# Patient Record
Sex: Male | Born: 1940
Health system: Southern US, Community
[De-identification: ages and names within clinical notes are randomized; demographics above are authoritative.]

## PROBLEM LIST (undated history)

## (undated) DIAGNOSIS — K8050 Calculus of bile duct without cholangitis or cholecystitis without obstruction: Secondary | ICD-10-CM

## (undated) DIAGNOSIS — G20 Parkinson's disease: Secondary | ICD-10-CM

## (undated) DIAGNOSIS — K811 Chronic cholecystitis: Secondary | ICD-10-CM

## (undated) DIAGNOSIS — E119 Type 2 diabetes mellitus without complications: Secondary | ICD-10-CM

## (undated) DIAGNOSIS — I639 Cerebral infarction, unspecified: Secondary | ICD-10-CM

## (undated) DIAGNOSIS — I10 Essential (primary) hypertension: Secondary | ICD-10-CM

## (undated) DIAGNOSIS — G20A1 Parkinson's disease without dyskinesia, without mention of fluctuations: Secondary | ICD-10-CM

## (undated) HISTORY — PX: ELBOW SURGERY: SHX618

## (undated) HISTORY — PX: NO PAST SURGERIES: SHX2092

---

## 2006-04-21 ENCOUNTER — Emergency Department: Payer: Self-pay | Admitting: Emergency Medicine

## 2009-09-02 ENCOUNTER — Ambulatory Visit: Payer: Self-pay | Admitting: Ophthalmology

## 2009-12-31 ENCOUNTER — Ambulatory Visit: Payer: Self-pay | Admitting: Ophthalmology

## 2015-02-06 ENCOUNTER — Ambulatory Visit: Payer: Self-pay | Admitting: Family Medicine

## 2016-05-05 ENCOUNTER — Encounter: Payer: Self-pay | Admitting: Internal Medicine

## 2016-05-05 ENCOUNTER — Emergency Department: Payer: Medicare Other

## 2016-05-05 ENCOUNTER — Inpatient Hospital Stay
Admission: EM | Admit: 2016-05-05 | Discharge: 2016-05-10 | DRG: 065 | Disposition: A | Discharge status: Home / Self Care | Payer: Medicare Other | Attending: Internal Medicine | Admitting: Internal Medicine

## 2016-05-05 DIAGNOSIS — Y92230 Patient room in hospital as the place of occurrence of the external cause: Secondary | ICD-10-CM | POA: Diagnosis not present

## 2016-05-05 DIAGNOSIS — I6522 Occlusion and stenosis of left carotid artery: Secondary | ICD-10-CM | POA: Diagnosis present

## 2016-05-05 DIAGNOSIS — R29810 Facial weakness: Secondary | ICD-10-CM | POA: Diagnosis present

## 2016-05-05 DIAGNOSIS — I10 Essential (primary) hypertension: Secondary | ICD-10-CM | POA: Diagnosis present

## 2016-05-05 DIAGNOSIS — T426X5A Adverse effect of other antiepileptic and sedative-hypnotic drugs, initial encounter: Secondary | ICD-10-CM | POA: Diagnosis not present

## 2016-05-05 DIAGNOSIS — G459 Transient cerebral ischemic attack, unspecified: Secondary | ICD-10-CM | POA: Diagnosis not present

## 2016-05-05 DIAGNOSIS — R569 Unspecified convulsions: Secondary | ICD-10-CM | POA: Diagnosis not present

## 2016-05-05 DIAGNOSIS — E876 Hypokalemia: Secondary | ICD-10-CM | POA: Diagnosis not present

## 2016-05-05 DIAGNOSIS — R441 Visual hallucinations: Secondary | ICD-10-CM | POA: Diagnosis not present

## 2016-05-05 DIAGNOSIS — G40909 Epilepsy, unspecified, not intractable, without status epilepticus: Secondary | ICD-10-CM

## 2016-05-05 DIAGNOSIS — I639 Cerebral infarction, unspecified: Principal | ICD-10-CM | POA: Diagnosis present

## 2016-05-05 DIAGNOSIS — N179 Acute kidney failure, unspecified: Secondary | ICD-10-CM | POA: Diagnosis present

## 2016-05-05 DIAGNOSIS — R4702 Dysphasia: Secondary | ICD-10-CM | POA: Diagnosis present

## 2016-05-05 DIAGNOSIS — M6281 Muscle weakness (generalized): Secondary | ICD-10-CM

## 2016-05-05 DIAGNOSIS — E1165 Type 2 diabetes mellitus with hyperglycemia: Secondary | ICD-10-CM | POA: Diagnosis present

## 2016-05-05 DIAGNOSIS — R4182 Altered mental status, unspecified: Secondary | ICD-10-CM

## 2016-05-05 DIAGNOSIS — I63132 Cerebral infarction due to embolism of left carotid artery: Secondary | ICD-10-CM | POA: Diagnosis not present

## 2016-05-05 DIAGNOSIS — R4701 Aphasia: Secondary | ICD-10-CM | POA: Diagnosis present

## 2016-05-05 DIAGNOSIS — Z808 Family history of malignant neoplasm of other organs or systems: Secondary | ICD-10-CM | POA: Diagnosis not present

## 2016-05-05 HISTORY — DX: Essential (primary) hypertension: I10

## 2016-05-05 HISTORY — DX: Type 2 diabetes mellitus without complications: E11.9

## 2016-05-05 LAB — COMPREHENSIVE METABOLIC PANEL
ALK PHOS: 104 U/L (ref 38–126)
ALT: 22 U/L (ref 17–63)
AST: 28 U/L (ref 15–41)
Albumin: 3.7 g/dL (ref 3.5–5.0)
Anion gap: 7 (ref 5–15)
BUN: 19 mg/dL (ref 6–20)
CHLORIDE: 106 mmol/L (ref 101–111)
CO2: 25 mmol/L (ref 22–32)
CREATININE: 1.53 mg/dL — AB (ref 0.61–1.24)
Calcium: 8.5 mg/dL — ABNORMAL LOW (ref 8.9–10.3)
GFR calc Af Amer: 49 mL/min — ABNORMAL LOW (ref >60)
GFR, EST NON AFRICAN AMERICAN: 42 mL/min — AB (ref >60)
Glucose, Bld: 341 mg/dL — ABNORMAL HIGH (ref 65–99)
Potassium: 4 mmol/L (ref 3.5–5.1)
Sodium: 138 mmol/L (ref 135–145)
TOTAL PROTEIN: 7.2 g/dL (ref 6.5–8.1)

## 2016-05-05 LAB — CBC WITH DIFFERENTIAL/PLATELET
BASOS PCT: 1 %
Basophils Absolute: 0.1 10*3/uL (ref 0–0.1)
EOS ABS: 0.3 10*3/uL (ref 0–0.7)
EOS PCT: 4 %
HCT: 38.4 % — ABNORMAL LOW (ref 40.0–52.0)
Hemoglobin: 12.5 g/dL — ABNORMAL LOW (ref 13.0–18.0)
LYMPHS ABS: 2.3 10*3/uL (ref 1.0–3.6)
Lymphocytes Relative: 36 %
MCH: 26 pg (ref 26.0–34.0)
MCHC: 32.6 g/dL (ref 32.0–36.0)
MCV: 79.9 fL — ABNORMAL LOW (ref 80.0–100.0)
MONO ABS: 0.4 10*3/uL (ref 0.2–1.0)
MONOS PCT: 7 %
NEUTROS PCT: 52 %
Neutro Abs: 3.5 10*3/uL (ref 1.4–6.5)
Platelets: 157 10*3/uL (ref 150–440)
RBC: 4.81 MIL/uL (ref 4.40–5.90)
RDW: 15 % — ABNORMAL HIGH (ref 11.5–14.5)
WBC: 6.6 10*3/uL (ref 3.8–10.6)

## 2016-05-05 LAB — URINALYSIS, COMPLETE (UACMP) WITH MICROSCOPIC
BACTERIA UA: NONE SEEN
Bilirubin Urine: NEGATIVE
Glucose, UA: >=500 mg/dL — AB
Hgb urine dipstick: NEGATIVE
KETONES UR: NEGATIVE mg/dL
LEUKOCYTES UA: NEGATIVE
Nitrite: NEGATIVE
PH: 6 (ref 5.0–8.0)
PROTEIN: NEGATIVE mg/dL
RBC / HPF: NONE SEEN RBC/hpf (ref 0–5)
SQUAMOUS EPITHELIAL / LPF: NONE SEEN
Specific Gravity, Urine: 1.002 — ABNORMAL LOW (ref 1.005–1.030)

## 2016-05-05 LAB — URINE DRUG SCREEN, QUALITATIVE (ARMC ONLY)
Amphetamines, Ur Screen: NOT DETECTED
BARBITURATES, UR SCREEN: NOT DETECTED
Benzodiazepine, Ur Scrn: NOT DETECTED
COCAINE METABOLITE, UR ~~LOC~~: NOT DETECTED
Cannabinoid 50 Ng, Ur ~~LOC~~: NOT DETECTED
MDMA (Ecstasy)Ur Screen: NOT DETECTED
METHADONE SCREEN, URINE: NOT DETECTED
OPIATE, UR SCREEN: NOT DETECTED
Phencyclidine (PCP) Ur S: NOT DETECTED
Tricyclic, Ur Screen: NOT DETECTED

## 2016-05-05 LAB — PROTIME-INR
INR: 0.97
Prothrombin Time: 12.9 seconds (ref 11.4–15.2)

## 2016-05-05 LAB — GLUCOSE, CAPILLARY
GLUCOSE-CAPILLARY: 328 mg/dL — AB (ref 65–99)
GLUCOSE-CAPILLARY: 245 mg/dL — AB (ref 65–99)

## 2016-05-05 LAB — TROPONIN I

## 2016-05-05 MED ORDER — INSULIN ASPART 100 UNIT/ML ~~LOC~~ SOLN
0.0000 [IU] | Freq: Three times a day (TID) | SUBCUTANEOUS | Status: DC
  Administered 2016-05-06: 18:00:00 5 [IU] via SUBCUTANEOUS
  Administered 2016-05-06: 3 [IU] via SUBCUTANEOUS
  Administered 2016-05-07: 5 [IU] via SUBCUTANEOUS
  Administered 2016-05-07: 2 [IU] via SUBCUTANEOUS
  Administered 2016-05-07 – 2016-05-08 (×2): 3 [IU] via SUBCUTANEOUS
  Administered 2016-05-08 – 2016-05-09 (×3): 5 [IU] via SUBCUTANEOUS
  Administered 2016-05-09 (×2): 2 [IU] via SUBCUTANEOUS
  Filled 2016-05-05 (×2): qty 3
  Filled 2016-05-05 (×2): qty 2
  Filled 2016-05-05 (×2): qty 5
  Filled 2016-05-05: qty 3
  Filled 2016-05-05 (×2): qty 5
  Filled 2016-05-05: qty 2
  Filled 2016-05-05: qty 5

## 2016-05-05 MED ORDER — ATORVASTATIN CALCIUM 20 MG PO TABS
40.0000 mg | ORAL_TABLET | Freq: Every day | ORAL | Status: DC
  Administered 2016-05-06 – 2016-05-09 (×4): 40 mg via ORAL
  Filled 2016-05-05 (×4): qty 2

## 2016-05-05 MED ORDER — ACETAMINOPHEN 325 MG PO TABS: 650.0000 mg | ORAL_TABLET | Freq: Four times a day (QID) | ORAL | Status: DC | PRN

## 2016-05-05 MED ORDER — SODIUM CHLORIDE 0.9 % IV SOLN
INTRAVENOUS | Status: DC
  Administered 2016-05-05 – 2016-05-07 (×3): via INTRAVENOUS

## 2016-05-05 MED ORDER — INSULIN ASPART 100 UNIT/ML ~~LOC~~ SOLN
0.0000 [IU] | Freq: Every day | SUBCUTANEOUS | Status: DC
  Administered 2016-05-05 – 2016-05-06 (×2): 2 [IU] via SUBCUTANEOUS
  Filled 2016-05-05 (×2): qty 2

## 2016-05-05 MED ORDER — ASPIRIN 81 MG PO CHEW
324.0000 mg | CHEWABLE_TABLET | Freq: Once | ORAL | Status: AC
  Administered 2016-05-05: 324 mg via ORAL
  Filled 2016-05-05: qty 4

## 2016-05-05 MED ORDER — ENOXAPARIN SODIUM 40 MG/0.4ML ~~LOC~~ SOLN
40.0000 mg | SUBCUTANEOUS | Status: DC
  Administered 2016-05-05 – 2016-05-09 (×5): 40 mg via SUBCUTANEOUS
  Filled 2016-05-05 (×5): qty 0.4

## 2016-05-05 MED ORDER — ASPIRIN EC 81 MG PO TBEC
81.0000 mg | DELAYED_RELEASE_TABLET | Freq: Every day | ORAL | Status: DC
Start: 2016-05-06 — End: 2016-05-10
  Administered 2016-05-06 – 2016-05-10 (×5): 81 mg via ORAL
  Filled 2016-05-05 (×5): qty 1

## 2016-05-05 MED ORDER — AMLODIPINE BESYLATE 5 MG PO TABS
ORAL_TABLET | ORAL | Status: AC
  Administered 2016-05-05: 5 mg via ORAL
  Filled 2016-05-05: qty 1

## 2016-05-05 MED ORDER — AMLODIPINE BESYLATE 5 MG PO TABS
5.0000 mg | ORAL_TABLET | Freq: Every day | ORAL | Status: DC
  Administered 2016-05-05 – 2016-05-09 (×4): 5 mg via ORAL
  Filled 2016-05-05 (×4): qty 1

## 2016-05-05 MED ORDER — LISINOPRIL 10 MG PO TABS
10.0000 mg | ORAL_TABLET | Freq: Every day | ORAL | Status: DC
  Administered 2016-05-05 – 2016-05-06 (×2): 10 mg via ORAL
  Filled 2016-05-05 (×2): qty 1

## 2016-05-05 MED ORDER — ACETAMINOPHEN 650 MG RE SUPP: 650.0000 mg | Freq: Four times a day (QID) | RECTAL | Status: DC | PRN

## 2016-05-05 MED ORDER — INSULIN GLARGINE 100 UNIT/ML ~~LOC~~ SOLN
10.0000 [IU] | Freq: Every day | SUBCUTANEOUS | Status: DC
  Administered 2016-05-05 – 2016-05-07 (×3): 10 [IU] via SUBCUTANEOUS
  Filled 2016-05-05 (×4): qty 0.1

## 2016-05-05 NOTE — ED Provider Notes (Signed)
[facility] Emergency Department Provider Note    First MD Initiated Contact with Patient 05/05/16 1821     (approximate)  I have reviewed the triage vital signs and the nursing notes.   HISTORY  Chief Complaint Fatigue    HPI Gilbert  Sr. is a 76 y.o. male presents with difficulty speaking and confusion. Last seen normal was last night at 5:30 PM. Patient slept in. Wife came back and saw him around 2:30 and felt the first time that interacted the patient was very confused with slurred speech and not making any sense. States that there was one brief episode last week with similar symptoms for roughly 30 minutes that resolved. Denies any known history of stroke. No blood thinners. Denies any chest pain or pressure. No nausea or vomiting.   PMH: no hxx of cva Gu-Win: htn PSH: no recent surgeries There are no active problems to display for this patient.     Prior to Admission medications   Not on File    Allergies Patient has no allergy information on record.    Social History Social History  Substance Use Topics  . Smoking status: Not on file  . Smokeless tobacco: Not on file  . Alcohol use Not on file    Review of Systems Patient denies headaches, rhinorrhea, blurry vision, numbness, shortness of breath, chest pain, edema, cough, abdominal pain, nausea, vomiting, diarrhea, dysuria, fevers, rashes or hallucinations unless otherwise stated above in HPI. ____________________________________________   PHYSICAL EXAM:  VITAL SIGNS: Vitals:   05/05/16 2127 05/05/16 2232  BP: (!) 192/83 (!) 216/86  Pulse: 66 72  Resp: 17 20  Temp:  98 F (36.7 C)    Constitutional: Alert and oriented. Well appearing and in no acute distress. Eyes: Conjunctivae are normal. PERRL. EOMI. Head: Atraumatic. Nose: No congestion/rhinnorhea. Mouth/Throat: Mucous membranes are moist.  Oropharynx non-erythematous. Neck: No stridor. Painless ROM. No  cervical spine tenderness to palpation Hematological/Lymphatic/Immunilogical: No cervical lymphadenopathy. Cardiovascular: Normal rate, regular rhythm. Grossly normal heart sounds.  Good peripheral circulation. Respiratory: Normal respiratory effort.  No retractions. Lungs CTAB. Gastrointestinal: Soft and nontender. No distention. No abdominal bruits. No CVA tenderness. Genitourinary:  Musculoskeletal: No lower extremity tenderness nor edema.  No joint effusions. Neurologic:  slurred dysarthric speech partial receptive and expressive aphasia.  Able to follow simple commands. No gross focal motor deficits are appreciated. No gait instability.Skin:  Skin is warm, dry and intact. No rash noted. Psychiatric: Mood and affect are normal. Speech and behavior are normal.  ____________________________________________   LABS (all labs ordered are listed, but only abnormal results are displayed)  Results for orders placed or performed during the hospital encounter of 05/05/16 (from the past 24 hour(s))  Glucose, capillary     Status: Abnormal   Collection Time: 05/05/16  4:12 PM  Result Value Ref Range   Glucose-Capillary 328 (H) 65 - 99 mg/dL  CBC with Differential     Status: Abnormal   Collection Time: 05/05/16  4:19 PM  Result Value Ref Range   WBC 6.6 3.8 - 10.6 K/uL   RBC 4.81 4.40 - 5.90 MIL/uL   Hemoglobin 12.5 (L) 13.0 - 18.0 g/dL   HCT 38.4 (L) 40.0 - 52.0 %   MCV 79.9 (L) 80.0 - 100.0 fL   MCH 26.0 26.0 - 34.0 pg   MCHC 32.6 32.0 - 36.0 g/dL   RDW 15.0 (H) 11.5 - 14.5 %   Platelets 157 150 - 440 K/uL  Neutrophils Relative % 52 %   Neutro Abs 3.5 1.4 - 6.5 K/uL   Lymphocytes Relative 36 %   Lymphs Abs 2.3 1.0 - 3.6 K/uL   Monocytes Relative 7 %   Monocytes Absolute 0.4 0.2 - 1.0 K/uL   Eosinophils Relative 4 %   Eosinophils Absolute 0.3 0 - 0.7 K/uL   Basophils Relative 1 %   Basophils Absolute 0.1 0 - 0.1 K/uL  Comprehensive metabolic panel     Status: Abnormal    Collection Time: 05/05/16  4:19 PM  Result Value Ref Range   Sodium 138 135 - 145 mmol/L   Potassium 4.0 3.5 - 5.1 mmol/L   Chloride 106 101 - 111 mmol/L   CO2 25 22 - 32 mmol/L   Glucose, Bld 341 (H) 65 - 99 mg/dL   BUN 19 6 - 20 mg/dL   Creatinine, Ser 1.53 (H) 0.61 - 1.24 mg/dL   Calcium 8.5 (L) 8.9 - 10.3 mg/dL   Total Protein 7.2 6.5 - 8.1 g/dL   Albumin 3.7 3.5 - 5.0 g/dL   AST 28 15 - 41 U/L   ALT 22 17 - 63 U/L   Alkaline Phosphatase 104 38 - 126 U/L   Total Bilirubin <0.1 (L) 0.3 - 1.2 mg/dL   GFR calc non Af Amer 42 (L) >60 mL/min   GFR calc Af Amer 49 (L) >60 mL/min   Anion gap 7 5 - 15  Troponin I     Status: None   Collection Time: 05/05/16  4:19 PM  Result Value Ref Range   Troponin I <0.03 <0.03 ng/mL  Protime-INR     Status: None   Collection Time: 05/05/16  4:19 PM  Result Value Ref Range   Prothrombin Time 12.9 11.4 - 15.2 seconds   INR 0.97    ____________________________________________  EKG My review and personal interpretation at Time: 16:14   Indication: cva  Rate: sinus   Rhythm: 75 Axis: normal Other: bifasicular block,  Normal intervals ____________________________________________  RADIOLOGY  I personally reviewed all radiographic images ordered to evaluate for the above acute complaints and reviewed radiology reports and findings.  These findings were personally discussed with the patient.  Please see medical record for radiology report. ____________________________________________   PROCEDURES  Procedure(s) performed:  Procedures    Critical Care performed: no ____________________________________________   INITIAL IMPRESSION / ASSESSMENT AND PLAN / ED COURSE  Pertinent labs & imaging results that were available during my care of the patient were reviewed by me and considered in my medical decision making (see chart for details).  DDX: cva, tia, hypoglycemia, dehydration, electrolyte abnormality, dissection, sepsis   Gilbert BUNTROCK Sr. is a 76 y.o. who presents to the ED with Symptoms concerning for acute stroke. Unfortunately patient is outside of the window to receive TPA as his last seen normal time was 5:30 yesterday evening. However is taken emergently to [place]. There is evidence of small lacunar infarct in the basal ganglia. He's exhibiting more of an expressive aphasia at this time. Does not demonstrate any weakness. Does have a history of diabetes and hypertension. No known history of stroke.  Does not appear to be suffering from any other metabolic or infectious process that could result in these symptoms. Based on his presentation I do feel the patient needs admission to the hospital for further evaluation of his acute neuro deficit.  Have discussed with the patient and available family all diagnostics and treatments performed thus far and  all questions were answered to the best of my ability. The patient demonstrates understanding and agreement with plan.   Clinical Course      ____________________________________________   FINAL CLINICAL IMPRESSION(S) / ED DIAGNOSES  Final diagnoses:  Cerebrovascular accident (CVA), unspecified mechanism ([place])      NEW MEDICATIONS STARTED DURING THIS VISIT:  New Prescriptions   No medications on file     Note:  This document was prepared using Dragon voice recognition software and may include unintentional dictation errors.    Merlyn Lot, MD 05/06/16 [phone]

## 2016-05-05 NOTE — ED Notes (Signed)
Admitting MD at bedside.

## 2016-05-05 NOTE — ED Notes (Signed)
Patient transported to CT 

## 2016-05-05 NOTE — ED Triage Notes (Signed)
Pt arrived at triage alert but trouble talking.  Wife reports generalized weakness and garbled speech.  States she last saw him normal at 5;30 pm yesterday bc he was asleep when she got home and asleep when she left for work this am.  States at 14:30 when she got home today he was confused and having garbled speech and seems to be getting progressively worse since.  Both grips equal, right side facial droop noted.  Trouble following directions and finding words.

## 2016-05-05 NOTE — ED Notes (Signed)
Patient given meal tray.

## 2016-05-05 NOTE — H&P (Signed)
[facility] [place] NAME: Gilbert [name]    MR#:  [id number]  DATE OF BIRTH:  [birth year]  DATE OF ADMISSION:  05/05/2016  PRIMARY CARE PHYSICIAN: Golden Pop, MD   REQUESTING/REFERRING PHYSICIAN: Dr Merlyn Lot  CHIEF COMPLAINT:   Chief Complaint  Patient presents with  . Fatigue    HISTORY OF PRESENT ILLNESS:  Makaio Mach  is a 76 y.o. male with a known history of Diabetes and hypertension presents with fatigue and confusion. The patient is unable to give me much history. History obtained from granddaughter at the bedside. Other family members went home to get his medication list. The patient is able to follow some simple commands but is confused with the instructions. He is slurring his speech and unable to get out his words as he would like. He is able to move all of his extremities. His difficulty hearing may be complicating him interpreting what I'm saying. Patient could not identify who is medical doctor is.  PAST MEDICAL HISTORY:   Past Medical History:  Diagnosis Date  . Diabetes mellitus without complication ([place])   . Hypertension     PAST SURGICAL HISTORY:   Past Surgical History:  Procedure Laterality Date  . NO PAST SURGERIES      SOCIAL HISTORY:   Social History  Substance Use Topics  . Smoking status: Never Smoker  . Smokeless tobacco: Never Used  . Alcohol use No    FAMILY HISTORY:   Family History  Problem Relation Age of Onset  . Brain cancer Mother     DRUG ALLERGIES:  Allergies not on file  REVIEW OF SYSTEMS:  CONSTITUTIONAL: No fever, fatigueAs per chief complaint the patient denies.  EYES: No blurred or double vision.  EARS, NOSE, AND THROAT: No tinnitus or ear pain. No sore throat. Decreased hearing RESPIRATORY: No cough, shortness of breath, wheezing or hemoptysis.  CARDIOVASCULAR: No chest pain, orthopnea, edema.  GASTROINTESTINAL: No nausea, vomiting, diarrhea or  abdominal pain. No blood in bowel movements GENITOURINARY: No dysuria, hematuria.  ENDOCRINE: No polyuria, nocturia,  HEMATOLOGY: No anemia, easy bruising or bleeding SKIN: No rash or lesion. MUSCULOSKELETAL: No joint pain or arthritis.   NEUROLOGIC: No tingling, numbness, weakness.  PSYCHIATRY: No anxiety or depression.   MEDICATIONS AT HOME:   Prior to Admission medications   Not on File    Family going home to get his medication list. Medication reconciliation process undergoing  VITAL SIGNS:  Vital signs have not been entered yet in the computer  PHYSICAL EXAMINATION:  GENERAL:  76 y.o.-year-old patient lying in the bed with no acute distress.  EYES: Pupils equal, round, reactive to light and accommodation. No scleral icterus. Extraocular muscles intact.  HEENT: Head atraumatic, normocephalic. Oropharynx and nasopharynx clear.  NECK:  Supple, no jugular venous distention. No thyroid enlargement, no tenderness.  LUNGS: Normal breath sounds bilaterally, no wheezing, rales,rhonchi or crepitation. No use of accessory muscles of respiration.  CARDIOVASCULAR: S1, S2 normal. No murmurs, rubs, or gallops.  ABDOMEN: Soft, nontender, nondistended. Bowel sounds present. No organomegaly or mass.  EXTREMITIES: No pedal edema, cyanosis, or clubbing.  NEUROLOGIC: Cranial nerves II through XII are intact. Muscle strength 5/5 in all extremities. Sensation intact. Gait not checked. Patient not able to process finger nose testing. Babinski equivocal PSYCHIATRIC: The patient is alert and answers yes or no questions.  SKIN: No rash, lesion, or ulcer.   LABORATORY PANEL:   CBC  Recent Labs Lab 05/05/16 1619  WBC 6.6  HGB 12.5*  HCT 38.4*  PLT 157   ------------------------------------------------------------------------------------------------------------------  Chemistries   Recent Labs Lab 05/05/16 1619  NA 138  K 4.0  CL 106  CO2 25  GLUCOSE 341*  BUN 19  CREATININE 1.53*   CALCIUM 8.5*  AST 28  ALT 22  ALKPHOS 104  BILITOT <0.1*   ------------------------------------------------------------------------------------------------------------------  Cardiac Enzymes  Recent Labs Lab 05/05/16 1619  TROPONINI <0.03   ------------------------------------------------------------------------------------------------------------------  RADIOLOGY:  Ct Head Wo Contrast  Result Date: 05/05/2016 CLINICAL DATA:  Altered mental status, aphasia EXAM: CT HEAD WITHOUT CONTRAST TECHNIQUE: Contiguous axial images were obtained from the base of the skull through the vertex without intravenous contrast. COMPARISON:  None. FINDINGS: Brain: No intracranial hemorrhage, mass effect or midline shift. Mild cerebral atrophy. Periventricular and patchy subcortical white matter decreased attenuation probable due to chronic small vessel ischemic changes. There is probable small lacunar infarct in left basal ganglia. No definite acute cortical infarction. No mass lesion is noted on this unenhanced scan. Vascular: Mild atherosclerotic calcifications of carotid siphon. Skull: No skull fracture is noted. Sinuses/Orbits: No acute findings Other: None IMPRESSION: No acute intracranial abnormality. No definite acute cortical infarction. Mild cerebral atrophy. There is periventricular and patchy subcortical white matter decreased attenuation probable due to chronic small vessel ischemic changes. Small lacunar infarct is noted in left basal ganglia. Electronically Signed   By: Lahoma Crocker M.D.   On: 05/05/2016 16:39    EKG:   Bifasicular Block  IMPRESSION AND PLAN:   1. Acute stroke with confusion, difficulty processing information, difficulty with speech and getting his words out. MRI of the brain, echocardiogram and carotid ultrasound. Neurology evaluation, speech therapy evaluation physical therapy and occupation therapy evaluation. Start aspirin and statin. Check a lipid profile tomorrow morning  check a hemoglobin A1c. Patient passed swallow evaluation in the ER. 2. Essential hypertension I was asked to see patient before the first vital sign even entered in the computer. I will assess his initial blood pressure and decide whether or not to give any medications. I will allow permissive hypertension. 3. Type 2 diabetes mellitus uncontrolled. Check a hemoglobin A1c. Start sliding scale. 4. Acute kidney injury. No baseline creatinine to compare. Gentle IV fluids overnight recheck creatinine tomorrow morning.   All the records are reviewed and case discussed with ED provider. Management plans discussed with the patient, family and they are in agreement.  CODE STATUS: Full code  TOTAL TIME TAKING CARE OF THIS PATIENT: 50 minutes.    Loletha Grayer M.D on 05/05/2016 at 7:40 PM  Between 7am to 6pm - Pager - [phone]  After 6pm call admission pager [phone]  [facility]  [phone]  CC: Primary care physician; Golden Pop, MD

## 2016-05-06 ENCOUNTER — Inpatient Hospital Stay: Payer: Medicare Other

## 2016-05-06 ENCOUNTER — Inpatient Hospital Stay (HOSPITAL_COMMUNITY)
Admit: 2016-05-06 | Discharge: 2016-05-06 | Disposition: A | Discharge status: Home / Self Care | Payer: Medicare Other | Attending: Internal Medicine | Admitting: Internal Medicine

## 2016-05-06 DIAGNOSIS — R4701 Aphasia: Secondary | ICD-10-CM

## 2016-05-06 DIAGNOSIS — I63132 Cerebral infarction due to embolism of left carotid artery: Secondary | ICD-10-CM

## 2016-05-06 DIAGNOSIS — G459 Transient cerebral ischemic attack, unspecified: Secondary | ICD-10-CM

## 2016-05-06 LAB — ECHOCARDIOGRAM COMPLETE
HEIGHTINCHES: 72 in
WEIGHTICAEL: 3176 [oz_av]

## 2016-05-06 LAB — BASIC METABOLIC PANEL
ANION GAP: 5 (ref 5–15)
BUN: 17 mg/dL (ref 6–20)
CO2: 29 mmol/L (ref 22–32)
Calcium: 8.5 mg/dL — ABNORMAL LOW (ref 8.9–10.3)
Chloride: 109 mmol/L (ref 101–111)
Creatinine, Ser: 1.37 mg/dL — ABNORMAL HIGH (ref 0.61–1.24)
GFR calc Af Amer: 56 mL/min — ABNORMAL LOW (ref >60)
GFR, EST NON AFRICAN AMERICAN: 48 mL/min — AB (ref >60)
GLUCOSE: 193 mg/dL — AB (ref 65–99)
POTASSIUM: 3.7 mmol/L (ref 3.5–5.1)
Sodium: 143 mmol/L (ref 135–145)

## 2016-05-06 LAB — LIPID PANEL
CHOLESTEROL: 132 mg/dL (ref 0–200)
HDL: 29 mg/dL — ABNORMAL LOW (ref >40)
LDL CALC: 61 mg/dL (ref 0–99)
TRIGLYCERIDES: 212 mg/dL — AB (ref <150)
Total CHOL/HDL Ratio: 4.6 RATIO
VLDL: 42 mg/dL — AB (ref 0–40)

## 2016-05-06 LAB — CBC
HEMATOCRIT: 35.8 % — AB (ref 40.0–52.0)
HEMOGLOBIN: 11.9 g/dL — AB (ref 13.0–18.0)
MCH: 26.3 pg (ref 26.0–34.0)
MCHC: 33.3 g/dL (ref 32.0–36.0)
MCV: 78.9 fL — ABNORMAL LOW (ref 80.0–100.0)
Platelets: 145 10*3/uL — ABNORMAL LOW (ref 150–440)
RBC: 4.54 MIL/uL (ref 4.40–5.90)
RDW: 14.6 % — AB (ref 11.5–14.5)
WBC: 6.3 10*3/uL (ref 3.8–10.6)

## 2016-05-06 LAB — GLUCOSE, CAPILLARY
Glucose-Capillary: 229 mg/dL — ABNORMAL HIGH (ref 65–99)
Glucose-Capillary: 264 mg/dL — ABNORMAL HIGH (ref 65–99)
Glucose-Capillary: 201 mg/dL — ABNORMAL HIGH (ref 65–99)

## 2016-05-06 MED ORDER — CLOPIDOGREL BISULFATE 75 MG PO TABS
75.0000 mg | ORAL_TABLET | Freq: Every day | ORAL | Status: DC
  Administered 2016-05-06 – 2016-05-10 (×5): 75 mg via ORAL
  Filled 2016-05-06 (×5): qty 1

## 2016-05-06 MED ORDER — LABETALOL HCL 5 MG/ML IV SOLN
10.0000 mg | INTRAVENOUS | Status: DC | PRN
  Administered 2016-05-06 – 2016-05-08 (×3): 10 mg via INTRAVENOUS
  Filled 2016-05-06 (×5): qty 4

## 2016-05-06 MED ORDER — IOPAMIDOL (ISOVUE-370) INJECTION 76%
75.0000 mL | Freq: Once | INTRAVENOUS | Status: AC | PRN
  Administered 2016-05-06: 75 mL via INTRAVENOUS

## 2016-05-06 NOTE — Evaluation (Signed)
Physical Therapy Evaluation Patient Details Name: Gilbert AYALA Sr. MRN: [id number] DOB: August 13, 1940 Today's Date: 05/06/2016   History of Present Illness  76 y/o male here with significant change in mental status.  He's having severe word finding issues, probable vision issues.  Pt is generally healthy at baseline and able to be active and independent.   Clinical Impression  Difficult eval as pt not only seemed to have trouble fully comprehending most of the instructions and cues given to him but more significantly he was unable to communicate appropriately back to PT.  He appeared to grossly have functional strength and mobility and was able to ambulate 200 ft but all of these were inherently limited secondary to pt comprehension and full formal testing of strength, coordination, etc were all difficult or impossible.  Pt appeared to have at least blurry vision if not visual field cuts, again difficult to assess secondary to receptive and expressive limitations.  Spent a lot of time educating family (and pt) on rehab and activity options, exercises, ways to assist/help and answering general questions - family very involved and interested in assisting however they can.    Follow Up Recommendations Home health PT;Outpatient PT;Supervision/Assistance - 24 hour (per progress - difficult secondary to communication issues)    Equipment Recommendations       Recommendations for Other Services       Precautions / Restrictions Precautions Precautions: Fall Restrictions Weight Bearing Restrictions: No      Mobility  Bed Mobility Overal bed mobility: Independent             General bed mobility comments: Pt getting up impulsively and was able to do so w/o assist  Transfers Overall transfer level: Independent Equipment used: None             General transfer comment: Pt able to get up w/o heavy UE assist and did not need any direct assist from PT.  He was somewhat impulsive but  generally safe and able to maintain his balance.  Ambulation/Gait Ambulation/Gait assistance: Min guard Ambulation Distance (Feet): 200 Feet Assistive device: None       General Gait Details: Pt able to do straigh-line gross motor ambulation with good confidence and generally good safety.  He was able to follow instructions to stop and instructions to turn around but he was uanble to understand turning right or left and multiple other simple instructions  Stairs            Wheelchair Mobility    Modified Rankin (Stroke Patients Only)       Balance Overall balance assessment: Modified Independent (no LOBs, a few stagger steps more related to cognition)                                           Pertinent Vitals/Pain Pain Assessment:  (family reports he's not seemed to be in pain, no indication)    Home Living Family/patient expects to be discharged to:: Private residence Living Arrangements: Spouse/significant other Available Help at Discharge: Family   Home Access: Stairs to enter   Technical brewer of Steps: 4          Prior Function Level of Independence: Independent         Comments: Pt normally able to be very active, apparently last week he was in the crawl space thawing frozen pipes with a blow torch  Hand Dominance   Dominant Hand: Right    Extremity/Trunk Assessment   Upper Extremity Assessment Upper Extremity Assessment: Difficult to assess due to impaired cognition;RUE deficits/detail (inconsistent/slow processing of task requests, L appears [facility]) RUE Deficits / Details: pt able to raise R UE to ~130 deg overhead, strength is functional 5/5, though appeared weaker than L. Pt unable to do any coordianation tasks secondary to processing.    Lower Extremity Assessment Lower Extremity Assessment: Difficult to assess due to impaired cognition (generally WFL, poor awareness of formal testing)       Communication    Communication: Receptive difficulties;Expressive difficulties (signficant struggle to form any appropriate/full words)  Cognition Arousal/Alertness:  (awake, appears to be aware, but unable to fully process) Behavior During Therapy:  (pt seems frustrated with vision and word finding issues) Overall Cognitive Status: Difficult to assess                 General Comments: Pt able to follow some simple instructions, struggles signficantly with even minimally complex tasks.  He is inconsistent with answering simple yes/no questions but generally showed decent safety awareness with basic mobility.    General Comments      Exercises     Assessment/Plan    PT Assessment Patient needs continued PT services  PT Problem List Decreased strength;Decreased range of motion;Decreased activity tolerance;Decreased balance;Decreased coordination;Decreased cognition;Decreased safety awareness          PT Treatment Interventions Gait training;Functional mobility training;Stair training;Therapeutic activities;Therapeutic exercise;Balance training;Neuromuscular re-education;Cognitive remediation;Patient/family education    PT Goals (Current goals can be found in the Care Plan section)  Acute Rehab PT Goals Patient Stated Goal: unable to state PT Goal Formulation: With family Time For Goal Achievement: 05/20/16 Potential to Achieve Goals: Fair    Frequency 7X/week   Barriers to discharge        Co-evaluation               End of Session Equipment Utilized During Treatment: Gait belt Activity Tolerance: Patient tolerated treatment well (limited secondary to profound communication issues) Patient left: with chair alarm set;with call bell/phone within reach;with family/visitor present Nurse Communication: Mobility status         Time: 6606-3016 PT Time Calculation (min) (ACUTE ONLY): 1425 min   Charges:   PT Evaluation $PT Eval High Complexity: 1 Procedure PT  Treatments $Therapeutic Activity: 8-22 mins   PT G Codes:        Kreg Shropshire, DPT 05/06/2016, 1:36 PM

## 2016-05-06 NOTE — Evaluation (Addendum)
Clinical/Bedside Swallow Evaluation Patient Details  Name: Gilbert FIUMARA Sr. MRN: [id number] Date of Birth: 1940/11/10  Today's Date: 05/06/2016 Time: SLP Start Time (ACUTE ONLY): 1350 SLP Stop Time (ACUTE ONLY): 1445 SLP Time Calculation (min) (ACUTE ONLY): 55 min  Past Medical History:  Past Medical History:  Diagnosis Date  . Diabetes mellitus without complication ([place])   . Hypertension    Past Surgical History:  Past Surgical History:  Procedure Laterality Date  . NO PAST SURGERIES     HPI:  Pt is an 76 y.o. male with a history of DM and HTN who on yesterday was found by wife when she got home to be confused and unable to communicate appropriately. Patient was ambulatory and no focal weakness was noted. Family brought patient to ED for evaluation. Pt is generally healthy at baseline and able to be active and independent at home/community. During initial presentation, pt exhibited semantic/phonemic paraphasias and word finding issues - unable to comprehend pt's responses during confrontational questions; noted intermittent, automatic speech responses/verbalizations. Unsure if Receptive language deficits or if Expressive deficits are impacting follow through. Pt also exhibited Right vision deficits.    Assessment / Plan / Recommendation Clinical Impression  Pt appears to present w/ reduced risk for aspiration when following general aspiration precautions. Pt consumed po trials of thin liquids and soft solids w/ no overt s/s of aspiration noted. Pt fed self but exhibited some difficulty stabbing at the food w/ a utensil d/t the vision deficits(R). Pt also exhibited moderate-severe expressive language deficits (suspect receptive deficits which need to be r/o) which impacted communication interactions w/ pt during instruction and encouragement to eat/drink at meal. After few trials, pt declined further stating "don't want it right now". Pt appears at his baseline for adequate toleration of  oral intake following general aspiration precautions w/ no further need for ST services re: dysphagia. Recommend continue w/ current diet as ordered w/ assistance at meals d/t vision deficits; language deficits. Recommend ST services f/u w/ Expressive/Receptive lanaguage assessment tomorrow. Pt's language deficits severely impact his communication abilities w/ others during ADLs.    Aspiration Risk   (reduced)    Diet Recommendation  Regular diet w/ thin liquids; general aspiration precautions  Medication Administration: Whole meds with liquid (as tolerates)    Other  Recommendations Recommended Consults:  (none) Oral Care Recommendations: Oral care BID;Staff/trained caregiver to provide oral care   Follow up Recommendations None (re: dysphagia)      Frequency and Duration            Prognosis Prognosis for Safe Diet Advancement: Good Barriers to Reach Goals: Language deficits (present)      Swallow Study   General Date of Onset: 05/05/16 HPI: Pt is an 76 y.o. male with a history of DM and HTN who on yesterday was found by wife when she got home to be confused and unable to communicate appropriately. Patient was ambulatory and no focal weakness was noted. Family brought patient to ED for evaluation. Pt is generally healthy at baseline and able to be active and independent at home/community. During initial presentation, pt exhibited semantic/phonemic paraphasias and word finding issues - unable to comprehend pt's responses during confrontational questions; noted intermittent, automatic speech responses/verbalizations. Unsure if Receptive language deficits or if Expressive deficits are impacting follow through. Pt also exhibited Right vision deficits.  Type of Study: Bedside Swallow Evaluation Previous Swallow Assessment: none Diet Prior to this Study: Regular;Thin liquids Temperature Spikes Noted: No (wbc not elevated; 6.3)  Respiratory Status: Room air History of Recent Intubation:  No Behavior/Cognition: Alert;Cooperative;Pleasant mood;Distractible (language deficits) Oral Cavity Assessment: Within Functional Limits Oral Care Completed by SLP: Recent completion by staff Oral Cavity - Dentition: Adequate natural dentition;Missing dentition Vision: Impaired for self-feeding (Right ) Self-Feeding Abilities: Able to feed self;Needs assist;Needs set up (d/t vision deficits/impact) Patient Positioning: Upright in bed Baseline Vocal Quality: Normal Volitional Cough: Cognitively unable to elicit (language) Volitional Swallow: Unable to elicit (language)    Oral/Motor/Sensory Function Overall Oral Motor/Sensory Function: Within functional limits (appeared wfl w/ bolus management)   Ice Chips Ice chips: Not tested   Thin Liquid Thin Liquid: Within functional limits Presentation: Straw Other Comments: no deficits noted by family earlier during the meal    Nectar Thick Nectar Thick Liquid: Not tested   Honey Thick Honey Thick Liquid: Not tested   Puree Puree: Not tested Other Comments: no deficits noted earlier during the meal   Solid   GO   Solid: Within functional limits Presentation: Self Fed;Spoon (peaches) Other Comments: vision deficits impacted self-feeding         Orinda Kenner, MS, CCC-SLP Watson,Katherine 05/06/2016,3:12 PM

## 2016-05-06 NOTE — Progress Notes (Signed)
*  PRELIMINARY RESULTS* Echocardiogram 2D Echocardiogram has been performed.  Sherrie Sport 05/06/2016, 8:38 AM

## 2016-05-06 NOTE — Progress Notes (Signed)
OT Cancellation Note  Patient Details Name: Gilbert [name]. MRN: [id number] DOB: 02-12-1941   Cancelled Treatment:    Reason Eval/Treat Not Completed: Patient at procedure or test/ unavailable. Order received, chart reviewed. Pt out of room for testing. Will attempt at later date/time when pt is available.  Corky Sox, OTR/L 05/06/2016, 2:51 PM

## 2016-05-06 NOTE — Progress Notes (Addendum)
[facility] NAME: Gilbert [name]    MR#:  [id number]  DATE OF BIRTH:  January 08, 1941  SUBJECTIVE:  Patient came in with confusion and expressive aphasia. Continues to have expressive aphasia. Follows all commands though.  REVIEW OF SYSTEMS:   Review of Systems  Constitutional: Negative for chills, fever and weight loss.  HENT: Negative for ear discharge, ear pain and nosebleeds.   Eyes: Negative for blurred vision, pain and discharge.  Respiratory: Negative for sputum production, shortness of breath, wheezing and stridor.   Cardiovascular: Negative for chest pain, palpitations, orthopnea and PND.  Gastrointestinal: Negative for abdominal pain, diarrhea, nausea and vomiting.  Genitourinary: Negative for frequency and urgency.  Musculoskeletal: Negative for back pain and joint pain.  Neurological: Positive for speech change. Negative for sensory change, focal weakness and weakness.  Psychiatric/Behavioral: Negative for depression and hallucinations. The patient is not nervous/anxious.    Tolerating Diet: Tolerating PT:   DRUG ALLERGIES:  No Known Allergies  VITALS:  Blood pressure (!) 189/77, pulse 85, temperature 98.2 F (36.8 C), temperature source Oral, resp. rate 16, height 6' (1.829 m), weight 90 kg (198 lb 8 oz), SpO2 91 %.  PHYSICAL EXAMINATION:   Physical Exam  GENERAL:  76 y.o.-year-old patient lying in the bed with no acute distress.  EYES: Pupils equal, round, reactive to light and accommodation. No scleral icterus. Extraocular muscles intact.  HEENT: Head atraumatic, normocephalic. Oropharynx and nasopharynx clear.  NECK:  Supple, no jugular venous distention. No thyroid enlargement, no tenderness.  LUNGS: Normal breath sounds bilaterally, no wheezing, rales, rhonchi. No use of accessory muscles of respiration.  CARDIOVASCULAR: S1, S2 normal. No murmurs, rubs, or gallops.  ABDOMEN: Soft, nontender,  nondistended. Bowel sounds present. No organomegaly or mass.  EXTREMITIES: No cyanosis, clubbing or edema b/l.    NEUROLOGIC: Cranial nerves II through XII are intact. No focal Motor or sensory deficits b/l.  Expressive aphasia PSYCHIATRIC:  patient is alert and able to check orientation secondary to expressive aphasia. SKIN: No obvious rash, lesion, or ulcer.   LABORATORY PANEL:  CBC  Recent Labs Lab 05/06/16 0444  WBC 6.3  HGB 11.9*  HCT 35.8*  PLT 145*    Chemistries   Recent Labs Lab 05/05/16 1619 05/06/16 0444  NA 138 143  K 4.0 3.7  CL 106 109  CO2 25 29  GLUCOSE 341* 193*  BUN 19 17  CREATININE 1.53* 1.37*  CALCIUM 8.5* 8.5*  AST 28  --   ALT 22  --   ALKPHOS 104  --   BILITOT <0.1*  --    Cardiac Enzymes  Recent Labs Lab 05/05/16 1619  TROPONINI <0.03   RADIOLOGY:  Ct Angio Head W Or Wo Contrast  Result Date: 05/06/2016 CLINICAL DATA:  76 year old male with fatigue and confusion. Prior carotid stent/endarterectomy. Left carotid stenosis on Doppler ultrasound today. Initial encounter. EXAM: CT ANGIOGRAPHY HEAD AND NECK TECHNIQUE: Multidetector CT imaging of the head and neck was performed using the standard protocol during bolus administration of intravenous contrast. Multiplanar CT image reconstructions and MIPs were obtained to evaluate the vascular anatomy. Carotid stenosis measurements (when applicable) are obtained utilizing NASCET criteria, using the distal internal carotid diameter as the denominator. CONTRAST:  75 mL Isovue 370 COMPARISON:  Brain MRI 0955 hours today. Head CT without contrast 05/05/2016. Carotid Doppler ultrasound 0833 hours today. FINDINGS: CTA NECK Skeleton: Fairly poor residual dentition. Mild for age cervical spine degeneration. No acute osseous  abnormality identified. Stable paranasal sinuses and mastoids. Upper chest: Dependent pulmonary atelectasis. Suggestion of some superimposed centrilobular emphysema. No superior mediastinal  lymphadenopathy, with scattered coarsely calcified mediastinal lymph nodes. Other neck: Negative thyroid, larynx, pharynx, parapharyngeal spaces, retropharyngeal space, sublingual space, submandibular glands and parotid glands. No cervical lymphadenopathy. Aortic arch: 4 vessel arch configuration, the left vertebral artery arises directly from the arch. Moderate soft and calcified arch atherosclerosis. Right carotid system: No brachiocephalic artery stenosis despite soft and calcified plaque. Mildly tortuous proximal right CCA. Mostly medial mild soft and calcified plaque proximal to the bifurcation without stenosis. At the right carotid bifurcation there is mild soft and calcified plaque but the right ICA origin and bulb are widely patent. Negative cervical right ICA otherwise. Left carotid system: No left CCA origin stenosis. Anterior and medial soft and calcified plaque in the left CCA proximal to the bifurcation including rather bulky low-density plaque at the level of the larynx (series 4, image 73), but this is not hemodynamically significant. At the left carotid bifurcation there is bulky soft and calcified plaque affecting the left ICA origin and bulb and resulting in high-grade stenosis numerically estimated at 60 % with respect to the distal vessel, but may be mildly underestimated. Distal to the bulb the cervical left ICA is negative. Vertebral arteries:No proximal right subclavian artery stenosis despite calcified plaque. Calcified plaque at the right vertebral artery origin but no hemodynamically significant stenosis suspected (series 7, image 125). Tortuous right V2 segment but otherwise negative right vertebral artery to the skullbase. Left vertebral artery arises directly from the arch with adjacent soft and calcified plaque, but no definite origins stenosis. However, there is proximal V1 calcified plaque resulting in mild to moderate stenosis in the superior mediastinum (series 6, image 324). Mildly  tortuous left V1 segment. The left vertebral is mildly non dominant but otherwise normal to the skullbase. CTA HEAD Posterior circulation: No distal vertebral artery stenosis, the right is mildly dominant. Normal right PICA origin. Dominant appearing right AICA. Normal vertebrobasilar junction, AICA origins, bilateral SCA and bilateral PCA origins. No basilar stenosis. Posterior communicating arteries are diminutive or absent. Bilateral PCA branches are within normal limits. Anterior circulation: Both ICA siphons are patent. There is comparatively mild cavernous segment calcified atherosclerosis bilaterally without stenosis. Normal ophthalmic artery origins. Normal carotid termini, MCA and ACA origins. Mildly tortuous A1 segments. Anterior communicating artery and bilateral ACA branches are within normal limits, with a prominent median artery of the corpus callosum (normal variant). Left MCA M1 segment has an early bifurcation which is normal. The left M1 and left MCA branches are within normal limits. Right MCA M1 segment, bifurcation, and right MCA branches are within normal limits. Venous sinuses: Patent. Anatomic variants: Left vertebral artery arises directly from the arch and is non dominant. Prominent median artery of the corpus callosum. Delayed phase: No abnormal enhancement identified. Stable gray-white matter differentiation throughout the brain. Review of the MIP images confirms the above findings IMPRESSION: 1. Negative for emergent large vessel occlusion. 2. Suspect previous right carotid endarterectomy with residual right carotid atherosclerosis but no stenosis. 3. Bulky soft and calcified left cervical carotid atherosclerosis, including involvement of the left ICA origin and bulb. Subsequent stenosis numerically estimated at 60%. 4. No significant stenosis of the dominant right vertebral artery. Soft and calcified plaque in the left V1 segment with up to moderate stenosis. 5. Comparatively little  intracranial atherosclerosis with no intracranial stenosis identified. 6.  Stable CT appearance of the brain. Electronically Signed  By: Genevie Ann M.D.   On: 05/06/2016 13:58   Ct Head Wo Contrast  Result Date: 05/05/2016 CLINICAL DATA:  Altered mental status, aphasia EXAM: CT HEAD WITHOUT CONTRAST TECHNIQUE: Contiguous axial images were obtained from the base of the skull through the vertex without intravenous contrast. COMPARISON:  None. FINDINGS: Brain: No intracranial hemorrhage, mass effect or midline shift. Mild cerebral atrophy. Periventricular and patchy subcortical white matter decreased attenuation probable due to chronic small vessel ischemic changes. There is probable small lacunar infarct in left basal ganglia. No definite acute cortical infarction. No mass lesion is noted on this unenhanced scan. Vascular: Mild atherosclerotic calcifications of carotid siphon. Skull: No skull fracture is noted. Sinuses/Orbits: No acute findings Other: None IMPRESSION: No acute intracranial abnormality. No definite acute cortical infarction. Mild cerebral atrophy. There is periventricular and patchy subcortical white matter decreased attenuation probable due to chronic small vessel ischemic changes. Small lacunar infarct is noted in left basal ganglia. Electronically Signed   By: Lahoma Crocker M.D.   On: 05/05/2016 16:39   Ct Angio Neck W Or Wo Contrast  Result Date: 05/06/2016 CLINICAL DATA:  76 year old male with fatigue and confusion. Prior carotid stent/endarterectomy. Left carotid stenosis on Doppler ultrasound today. Initial encounter. EXAM: CT ANGIOGRAPHY HEAD AND NECK TECHNIQUE: Multidetector CT imaging of the head and neck was performed using the standard protocol during bolus administration of intravenous contrast. Multiplanar CT image reconstructions and MIPs were obtained to evaluate the vascular anatomy. Carotid stenosis measurements (when applicable) are obtained utilizing NASCET criteria, using the  distal internal carotid diameter as the denominator. CONTRAST:  75 mL Isovue 370 COMPARISON:  Brain MRI 0955 hours today. Head CT without contrast 05/05/2016. Carotid Doppler ultrasound 0833 hours today. FINDINGS: CTA NECK Skeleton: Fairly poor residual dentition. Mild for age cervical spine degeneration. No acute osseous abnormality identified. Stable paranasal sinuses and mastoids. Upper chest: Dependent pulmonary atelectasis. Suggestion of some superimposed centrilobular emphysema. No superior mediastinal lymphadenopathy, with scattered coarsely calcified mediastinal lymph nodes. Other neck: Negative thyroid, larynx, pharynx, parapharyngeal spaces, retropharyngeal space, sublingual space, submandibular glands and parotid glands. No cervical lymphadenopathy. Aortic arch: 4 vessel arch configuration, the left vertebral artery arises directly from the arch. Moderate soft and calcified arch atherosclerosis. Right carotid system: No brachiocephalic artery stenosis despite soft and calcified plaque. Mildly tortuous proximal right CCA. Mostly medial mild soft and calcified plaque proximal to the bifurcation without stenosis. At the right carotid bifurcation there is mild soft and calcified plaque but the right ICA origin and bulb are widely patent. Negative cervical right ICA otherwise. Left carotid system: No left CCA origin stenosis. Anterior and medial soft and calcified plaque in the left CCA proximal to the bifurcation including rather bulky low-density plaque at the level of the larynx (series 4, image 73), but this is not hemodynamically significant. At the left carotid bifurcation there is bulky soft and calcified plaque affecting the left ICA origin and bulb and resulting in high-grade stenosis numerically estimated at 60 % with respect to the distal vessel, but may be mildly underestimated. Distal to the bulb the cervical left ICA is negative. Vertebral arteries:No proximal right subclavian artery stenosis  despite calcified plaque. Calcified plaque at the right vertebral artery origin but no hemodynamically significant stenosis suspected (series 7, image 125). Tortuous right V2 segment but otherwise negative right vertebral artery to the skullbase. Left vertebral artery arises directly from the arch with adjacent soft and calcified plaque, but no definite origins stenosis. However, there is proximal  V1 calcified plaque resulting in mild to moderate stenosis in the superior mediastinum (series 6, image 324). Mildly tortuous left V1 segment. The left vertebral is mildly non dominant but otherwise normal to the skullbase. CTA HEAD Posterior circulation: No distal vertebral artery stenosis, the right is mildly dominant. Normal right PICA origin. Dominant appearing right AICA. Normal vertebrobasilar junction, AICA origins, bilateral SCA and bilateral PCA origins. No basilar stenosis. Posterior communicating arteries are diminutive or absent. Bilateral PCA branches are within normal limits. Anterior circulation: Both ICA siphons are patent. There is comparatively mild cavernous segment calcified atherosclerosis bilaterally without stenosis. Normal ophthalmic artery origins. Normal carotid termini, MCA and ACA origins. Mildly tortuous A1 segments. Anterior communicating artery and bilateral ACA branches are within normal limits, with a prominent median artery of the corpus callosum (normal variant). Left MCA M1 segment has an early bifurcation which is normal. The left M1 and left MCA branches are within normal limits. Right MCA M1 segment, bifurcation, and right MCA branches are within normal limits. Venous sinuses: Patent. Anatomic variants: Left vertebral artery arises directly from the arch and is non dominant. Prominent median artery of the corpus callosum. Delayed phase: No abnormal enhancement identified. Stable gray-white matter differentiation throughout the brain. Review of the MIP images confirms the above  findings IMPRESSION: 1. Negative for emergent large vessel occlusion. 2. Suspect previous right carotid endarterectomy with residual right carotid atherosclerosis but no stenosis. 3. Bulky soft and calcified left cervical carotid atherosclerosis, including involvement of the left ICA origin and bulb. Subsequent stenosis numerically estimated at 60%. 4. No significant stenosis of the dominant right vertebral artery. Soft and calcified plaque in the left V1 segment with up to moderate stenosis. 5. Comparatively little intracranial atherosclerosis with no intracranial stenosis identified. 6.  Stable CT appearance of the brain. Electronically Signed   By: Genevie Ann M.D.   On: 05/06/2016 13:58   Mr Brain Wo Contrast  Result Date: 05/06/2016 CLINICAL DATA:  76 year old male with fatigue and confusion. Initial encounter. EXAM: MRI HEAD WITHOUT CONTRAST TECHNIQUE: Multiplanar, multiecho pulse sequences of the brain and surrounding structures were obtained without intravenous contrast. COMPARISON:  Head CT without contrast 05/05/2016. FINDINGS: Brain: Trace right anterior subdural hygroma (series 8, image 15), evident on the CT yesterday and without associated intracranial mass effect. No other extra-axial collection suspected. No acute intracranial hemorrhage identified. No restricted diffusion to suggest acute infarction. No midline shift, mass effect, evidence of mass lesion, or ventriculomegaly. Cervicomedullary junction and pituitary are within normal limits. Patchy and confluent bilateral cerebral white matter T2 and FLAIR hyperintensity with no associated cortical encephalomalacia. No definite chronic cerebral blood products. There is a chronic lacunar infarct of the left lentiform nuclei, with otherwise normal for age deep gray matter signal. Brainstem and cerebellum are normal for age. Vascular: Major intracranial vascular flow voids are preserved. Skull and upper cervical spine: Negative. Sinuses/Orbits:  Postoperative changes to both globes, otherwise normal orbits soft tissues. Mild right sphenoid sinus mucosal thickening. Other paranasal sinuses and mastoids are stable and well pneumatized. Other: Visible internal auditory structures appear normal. Negative scalp soft tissues. IMPRESSION: 1. There is a trace right anterior frontal convexity subdural hygroma (series 3, image 15), but this could be chronic and appears inconsequential. 2. No acute infarct or acute intracranial abnormality. 3. Moderate for age cerebral signal changes are most likely chronic small vessel disease related. Electronically Signed   By: Genevie Ann M.D.   On: 05/06/2016 10:27   US Carotid Bilateral  Result Date: 05/06/2016 CLINICAL DATA:  77 year old male with a history of facial droop. Cardiovascular risk factors include hypertension, known vascular surgery with prior carotid stent on the right/endarterectomy, known prior stroke, diabetes EXAM: BILATERAL CAROTID DUPLEX ULTRASOUND TECHNIQUE: Pearline Cables scale imaging, color Doppler and duplex ultrasound were performed of bilateral carotid and vertebral arteries in the neck. COMPARISON:  Report 05/09/2001 FINDINGS: Criteria: Quantification of carotid stenosis is based on velocity parameters that correlate the residual internal carotid diameter with NASCET-based stenosis levels, using the diameter of the distal internal carotid lumen as the denominator for stenosis measurement. The following velocity measurements were obtained: RIGHT ICA:  Systolic 616 cm/sec, Diastolic 31 cm/sec CCA:  84 cm/sec SYSTOLIC ICA/CCA RATIO:  1.6 ECA:  124 cm/sec LEFT ICA:  Systolic 073 cm/sec, Diastolic 49 cm/sec CCA:  710 cm/sec SYSTOLIC ICA/CCA RATIO:  1.5 ECA:  182 cm/sec Right Brachial SBP: Not acquired Left Brachial SBP: Not acquired RIGHT CAROTID ARTERY: No significant calcifications of the right common carotid artery. Intermediate waveform maintained. Heterogeneous and partially calcified plaque at the right  carotid bifurcation. No significant lumen shadowing. Low resistance waveform of the right ICA. No significant tortuosity. No visualize stent. RIGHT VERTEBRAL ARTERY: Antegrade flow with low resistance waveform. LEFT CAROTID ARTERY: No significant calcifications of the left common carotid artery. Intermediate waveform maintained. Heterogeneous and partially calcified plaque at the left carotid bifurcation. Luminal shadowing present. Low resistance waveform of the left ICA. No significant tortuosity. LEFT VERTEBRAL ARTERY:  Antegrade flow with low resistance waveform. IMPRESSION: Right: Note that the duplex criteria have not been validated in postoperative endarterectomy patients, which is the given history in this patient, however, there is no evidence of high-grade stenosis evident by the waveform. Left: Heterogeneous and partially calcified plaque at the carotid bifurcation, with discordant results regarding degree of stenosis by established duplex criteria. Peak velocity suggests 50%- 69% stenosis, with the ICA/ CCA ratio suggesting a lesser degree of stenosis. If establishing a more accurate degree of stenosis is required, cerebral angiogram should be considered, or as a second best test, CTA. Signed, Dulcy Fanny. Earleen Newport, DO [facility] Electronically Signed   By: Corrie Mckusick D.O.   On: 05/06/2016 09:36   ASSESSMENT AND PLAN:   Mariana Goytia  is a 76 y.o. male with a known history of Diabetes and hypertension presents with fatigue and confusion. The patient is unable to give me much history. History obtained from granddaughter at the bedside. Other family members went home to get his medication list. The patient is able to follow some simple commands but is confused with the instructions. He is slurring his speech and unable to get out his words as he would like. He is able to move all of his extremities.   1. Acute stroke with confusion, difficulty  processing information, difficulty with speech and getting his words out. -CT head positive for small lacunar left basal ganglia infarct - MRI of the brain however is negative - echocardiogram EF 55-60% valvular abnormality -carotid ultrasound and CT and neck shows 60% left carotid atherosclerosis. No significant stenosis noted. -. Neurology evaluation appreciated.  -speech therapy evaluation physical therapy and occupation therapy evaluation.  -On aspirin added Plavix by neurology -Continue statin.-EEG pending  2. Essential hypertension - allow for visit hypertension when necessary hydralazine if needed   3. Type 2 diabetes mellitus uncontrolled. Check a hemoglobin A1c. Start sliding scale.  4. Acute kidney injury. No baseline creatinine to compare.   5 PT recommended  home PT  Case discussed with Care Management/Social Worker. Management plans discussed with the patient, family and they are in agreement.  CODE STATUS: Full  DVT Prophylaxis: Lovenox  TOTAL TIME TAKING CARE OF THIS PATIENT: 30  minutes.  >50% time spent on counselling and coordination of care  POSSIBLE D/C IN 1-2  DAYS, DEPENDING ON CLINICAL CONDITION.  Note: This dictation was prepared with Dragon dictation along with smaller phrase technology. Any transcriptional errors that result from this process are unintentional.  Mechel Schutter M.D on 05/06/2016 at 3:31 PM  Between 7am to 6pm - Pager - [phone]  After 6pm go to [email or web address] - password [email or web address] [facility]  Office  [phone]  CC: Primary care physician; Golden Pop, MD

## 2016-05-06 NOTE — Consult Note (Addendum)
Referring Physician: Posey Pronto    Chief Complaint: Difficulty with speech  HPI: Gilbert Finner Sr. is an 76 y.o. male with a history of DM and HTN who on yesterday was found by wife when she got home to be confused and unable to communicate appropriately.  Patient was ambulatory and no focal weakness was noted.  Family brought patient to ED for evaluation.  Patient had last been noted to be at his baseline the evening prior. Due to speech deficits patient is unable to provide any history.   Initial NIHSS of 7.    Date last known well: Date: 05/04/2016 Time last known well: Time: 17:30 tPA Given: No: Outside time window  Past Medical History:  Diagnosis Date  . Diabetes mellitus without complication ([place])   . Hypertension     Past Surgical History:  Procedure Laterality Date  . NO PAST SURGERIES      Family History  Problem Relation Age of Onset  . Brain cancer Mother    Social History:  reports that Gilbert [name] has never smoked. Gilbert [name] has never used smokeless tobacco. Gilbert [name] reports that Gilbert [name] does not drink alcohol or use drugs.  Allergies: No Known Allergies  Medications:  I have reviewed the patient's current medications. Prior to Admission:  Prescriptions Prior to Admission  Medication Sig Dispense Refill Last Dose  . atenolol (TENORMIN) 25 MG tablet Take 25 mg by mouth daily. 1/2 tab po once daily   unknown at unknown  . indomethacin (INDOCIN) 50 MG capsule Take 50 mg by mouth 2 (two) times daily with a meal. 1 tab po twice daily as needed for gout   unknown at unknown  . insulin aspart protamine- aspart (NOVOLOG MIX 70/30) (70-30) 100 UNIT/ML injection Inject 30 Units into the skin 2 (two) times daily with a meal. 30 units subq morning and evening   unknown at unknown  . simvastatin (ZOCOR) 80 MG tablet Take 80 mg by mouth daily. 1/2 tab po once daily   unknown at unknown   Scheduled: . amLODipine  5 mg Oral Daily  . aspirin EC  81 mg Oral Daily  . atorvastatin  40 mg Oral q1800  .  enoxaparin (LOVENOX) injection  40 mg Subcutaneous Q24H  . insulin aspart  0-5 Units Subcutaneous QHS  . insulin aspart  0-9 Units Subcutaneous TID WC  . insulin glargine  10 Units Subcutaneous QHS  . lisinopril  10 mg Oral QHS    ROS: History obtained from wife  General ROS: negative for - chills, fatigue, fever, night sweats, weight gain or weight loss Psychological ROS: negative for - behavioral disorder, hallucinations, memory difficulties, mood swings or suicidal ideation Ophthalmic ROS: negative for - blurry vision, double vision, eye pain or loss of vision ENT ROS: negative for - epistaxis, nasal discharge, oral lesions, sore throat, tinnitus or vertigo Allergy and Immunology ROS: negative for - hives or itchy/watery eyes Hematological and Lymphatic ROS: negative for - bleeding problems, bruising or swollen lymph nodes Endocrine ROS: negative for - galactorrhea, hair pattern changes, polydipsia/polyuria or temperature intolerance Respiratory ROS: negative for - cough, hemoptysis, shortness of breath or wheezing Cardiovascular ROS: negative for - chest pain, dyspnea on exertion, edema or irregular heartbeat Gastrointestinal ROS: negative for - abdominal pain, diarrhea, hematemesis, nausea/vomiting or stool incontinence Genito-Urinary ROS: negative for - dysuria, hematuria, incontinence or urinary frequency/urgency Musculoskeletal ROS: negative for - joint swelling or muscular weakness Neurological ROS: as noted in HPI Dermatological ROS: negative for rash and skin lesion  changes  Physical Examination: Blood pressure (!) 208/88, pulse 87, temperature 98.2 F (36.8 C), temperature source Oral, resp. rate 16, height 6' (1.829 m), weight 90 kg (198 lb 8 oz), SpO2 91 %.  HEENT-  Normocephalic, no lesions, without obvious abnormality.  Normal external eye and conjunctiva.  Normal TM's bilaterally.  Normal auditory canals and external ears. Normal external nose, mucus membranes and  septum.  Normal pharynx. Cardiovascular- S1, S2 normal, pulses palpable throughout   Lungs- chest clear, no wheezing, rales, normal symmetric air entry Abdomen- soft, non-tender; bowel sounds normal; no masses,  no organomegaly Extremities- no edema Lymph-no adenopathy palpable Musculoskeletal-no joint tenderness, deformity or swelling Skin-warm and dry, no hyperpigmentation, vitiligo, or suspicious lesions  Neurological Examination Mental Status: Alert.  Expressive aphasia.  Unable to follow most commands requiring many visual cues that at times are not helpful as well.  Cranial Nerves: II: Discs flat bilaterally; Blinks to bilateral confrontation.  Pupils equal, round, reactive to light and accommodation III,IV, VI: ptosis not present, extra-ocular motions intact bilaterally V,VII: right facial droop, facial light touch sensation normal bilaterally VIII: hearing normal bilaterally IX,X: gag reflex present XI: bilateral shoulder shrug XII: midline tongue extension Motor: Right : Upper extremity   5/5    Left:     Upper extremity   5/5  Lower extremity   5/5     Lower extremity   5/5 Tone and bulk:normal tone throughout; no atrophy noted Sensory: Pinprick and light touch intact throughout, bilaterally Deep Tendon Reflexes: 2+ in the upper extremities, 1+ KJ's bilaterally and absent AJ's bilaterally Plantars: Right: downgoing   Left: downgoing Cerebellar: Unable to perform finger-to-nose.  Normal heel-to-shin testing Gait: not tested due to safety concerns    Laboratory Studies:  Basic Metabolic Panel:  Recent Labs Lab 05/05/16 1619 05/06/16 0444  NA 138 143  K 4.0 3.7  CL 106 109  CO2 25 29  GLUCOSE 341* 193*  BUN 19 17  CREATININE 1.53* 1.37*  CALCIUM 8.5* 8.5*    Liver Function Tests:  Recent Labs Lab 05/05/16 1619  AST 28  ALT 22  ALKPHOS 104  BILITOT <0.1*  PROT 7.2  ALBUMIN 3.7   No results for input(s): LIPASE, AMYLASE in the last 168 hours. No  results for input(s): AMMONIA in the last 168 hours.  CBC:  Recent Labs Lab 05/05/16 1619 05/06/16 0444  WBC 6.6 6.3  NEUTROABS 3.5  --   HGB 12.5* 11.9*  HCT 38.4* 35.8*  MCV 79.9* 78.9*  PLT 157 145*    Cardiac Enzymes:  Recent Labs Lab 05/05/16 1619  TROPONINI <0.03    BNP: Invalid input(s): POCBNP  CBG:  Recent Labs Lab 05/05/16 1612 05/05/16 2257  GLUCAP 328* 245*    Microbiology: No results found for this or any previous visit.  Coagulation Studies:  Recent Labs  05/05/16 1619  LABPROT 12.9  INR 0.97    Urinalysis:  Recent Labs Lab 05/05/16 1901  COLORURINE STRAW*  LABSPEC 1.002*  PHURINE 6.0  GLUCOSEU >=500*  HGBUR NEGATIVE  BILIRUBINUR NEGATIVE  KETONESUR NEGATIVE  PROTEINUR NEGATIVE  NITRITE NEGATIVE  LEUKOCYTESUR NEGATIVE    Lipid Panel:    Component Value Date/Time   CHOL 132 05/06/2016 0444   TRIG 212 (H) 05/06/2016 0444   HDL 29 (L) 05/06/2016 0444   CHOLHDL 4.6 05/06/2016 0444   VLDL 42 (H) 05/06/2016 0444   LDLCALC 61 05/06/2016 0444    HgbA1C: No results found for: HGBA1C  Urine Drug Screen:  Component Value Date/Time   LABOPIA NONE DETECTED 05/05/2016 1901   COCAINSCRNUR NONE DETECTED 05/05/2016 1901   LABBENZ NONE DETECTED 05/05/2016 1901   AMPHETMU NONE DETECTED 05/05/2016 1901   THCU NONE DETECTED 05/05/2016 1901   LABBARB NONE DETECTED 05/05/2016 1901    Alcohol Level: No results for input(s): ETH in the last 168 hours.  Other results: EKG: 77 bpm, RBBB.  Imaging: Ct Head Wo Contrast  Result Date: 05/05/2016 CLINICAL DATA:  Altered mental status, aphasia EXAM: CT HEAD WITHOUT CONTRAST TECHNIQUE: Contiguous axial images were obtained from the base of the skull through the vertex without intravenous contrast. COMPARISON:  None. FINDINGS: Brain: No intracranial hemorrhage, mass effect or midline shift. Mild cerebral atrophy. Periventricular and patchy subcortical white matter decreased attenuation  probable due to chronic small vessel ischemic changes. There is probable small lacunar infarct in left basal ganglia. No definite acute cortical infarction. No mass lesion is noted on this unenhanced scan. Vascular: Mild atherosclerotic calcifications of carotid siphon. Skull: No skull fracture is noted. Sinuses/Orbits: No acute findings Other: None IMPRESSION: No acute intracranial abnormality. No definite acute cortical infarction. Mild cerebral atrophy. There is periventricular and patchy subcortical white matter decreased attenuation probable due to chronic small vessel ischemic changes. Small lacunar infarct is noted in left basal ganglia. Electronically Signed   By: Lahoma Crocker M.D.   On: 05/05/2016 16:39   Mr Brain Wo Contrast  Result Date: 05/06/2016 CLINICAL DATA:  76 year old male with fatigue and confusion. Initial encounter. EXAM: MRI HEAD WITHOUT CONTRAST TECHNIQUE: Multiplanar, multiecho pulse sequences of the brain and surrounding structures were obtained without intravenous contrast. COMPARISON:  Head CT without contrast 05/05/2016. FINDINGS: Brain: Trace right anterior subdural hygroma (series 8, image 15), evident on the CT yesterday and without associated intracranial mass effect. No other extra-axial collection suspected. No acute intracranial hemorrhage identified. No restricted diffusion to suggest acute infarction. No midline shift, mass effect, evidence of mass lesion, or ventriculomegaly. Cervicomedullary junction and pituitary are within normal limits. Patchy and confluent bilateral cerebral white matter T2 and FLAIR hyperintensity with no associated cortical encephalomalacia. No definite chronic cerebral blood products. There is a chronic lacunar infarct of the left lentiform nuclei, with otherwise normal for age deep gray matter signal. Brainstem and cerebellum are normal for age. Vascular: Major intracranial vascular flow voids are preserved. Skull and upper cervical spine: Negative.  Sinuses/Orbits: Postoperative changes to both globes, otherwise normal orbits soft tissues. Mild right sphenoid sinus mucosal thickening. Other paranasal sinuses and mastoids are stable and well pneumatized. Other: Visible internal auditory structures appear normal. Negative scalp soft tissues. IMPRESSION: 1. There is a trace right anterior frontal convexity subdural hygroma (series 3, image 15), but this could be chronic and appears inconsequential. 2. No acute infarct or acute intracranial abnormality. 3. Moderate for age cerebral signal changes are most likely chronic small vessel disease related. Electronically Signed   By: Genevie Ann M.D.   On: 05/06/2016 10:27   US Carotid Bilateral  Result Date: 05/06/2016 CLINICAL DATA:  76 year old male with a history of facial droop. Cardiovascular risk factors include hypertension, known vascular surgery with prior carotid stent on the right/endarterectomy, known prior stroke, diabetes EXAM: BILATERAL CAROTID DUPLEX ULTRASOUND TECHNIQUE: Pearline Cables scale imaging, color Doppler and duplex ultrasound were performed of bilateral carotid and vertebral arteries in the neck. COMPARISON:  Report 05/09/2001 FINDINGS: Criteria: Quantification of carotid stenosis is based on velocity parameters that correlate the residual internal carotid diameter with NASCET-based stenosis levels, using the diameter  of the distal internal carotid lumen as the denominator for stenosis measurement. The following velocity measurements were obtained: RIGHT ICA:  Systolic 295 cm/sec, Diastolic 31 cm/sec CCA:  84 cm/sec SYSTOLIC ICA/CCA RATIO:  1.6 ECA:  124 cm/sec LEFT ICA:  Systolic 621 cm/sec, Diastolic 49 cm/sec CCA:  308 cm/sec SYSTOLIC ICA/CCA RATIO:  1.5 ECA:  182 cm/sec Right Brachial SBP: Not acquired Left Brachial SBP: Not acquired RIGHT CAROTID ARTERY: No significant calcifications of the right common carotid artery. Intermediate waveform maintained. Heterogeneous and partially calcified plaque at  the right carotid bifurcation. No significant lumen shadowing. Low resistance waveform of the right ICA. No significant tortuosity. No visualize stent. RIGHT VERTEBRAL ARTERY: Antegrade flow with low resistance waveform. LEFT CAROTID ARTERY: No significant calcifications of the left common carotid artery. Intermediate waveform maintained. Heterogeneous and partially calcified plaque at the left carotid bifurcation. Luminal shadowing present. Low resistance waveform of the left ICA. No significant tortuosity. LEFT VERTEBRAL ARTERY:  Antegrade flow with low resistance waveform. IMPRESSION: Right: Note that the duplex criteria have not been validated in postoperative endarterectomy patients, which is the given history in this patient, however, there is no evidence of high-grade stenosis evident by the waveform. Left: Heterogeneous and partially calcified plaque at the carotid bifurcation, with discordant results regarding degree of stenosis by established duplex criteria. Peak velocity suggests 50%- 69% stenosis, with the ICA/ CCA ratio suggesting a lesser degree of stenosis. If establishing a more accurate degree of stenosis is required, cerebral angiogram should be considered, or as a second best test, CTA. Signed, Dulcy Fanny. Earleen Newport, DO [facility] Electronically Signed   By: Corrie Mckusick D.O.   On: 05/06/2016 09:36    Assessment: 76 y.o. male presenting with aphasia and right facial droop.  Patient on no antiplatelet therapy at home.  Head CT unremarkable.  MRI of the brain reviewed and shows no acute changes.  Concern remains for an MR negative infarct of embolic etiology.  Carotid dopplers suggested concern for further teating.  Echocardiogram shows no cardiac source of emboli with an EF of 55-60%.   LDL 61.  Stroke Risk Factors - diabetes mellitus and hypertension  Plan: 1. HgbA1c 2. CT angio of head and neck 3. PT consult, OT consult, Speech  consult 4. Prophylactic therapy-Antiplatelet med: Aspirin - dose 81mg , Plavix 75mg  daily.  Continue statin. 5. NPO until RN stroke swallow screen 6. Telemetry monitoring 7. Frequent neuro checks 8. EEG   Alexis Goodell, MD Neurology [phone] 05/06/2016, 11:35 AM

## 2016-05-06 NOTE — Progress Notes (Signed)
Inpatient Diabetes Program Recommendations  AACE/ADA: New Consensus Statement on Inpatient Glycemic Control (2015)  Target Ranges:  Prepandial:   less than 140 mg/dL      Peak postprandial:   less than 180 mg/dL (1-2 hours)      Critically ill patients:  140 - 180 mg/dL   Lab Results  Component Value Date   GLUCAP 245 (H) 05/05/2016    Review of Glycemic Control  Results for GENESIS, PAGET (MRN [id number]) as of 05/06/2016 09:40  Ref. Range 05/05/2016 16:12 05/05/2016 22:57  Glucose-Capillary Latest Ref Range: 65 - 99 mg/dL 328 (H) 245 (H)    Diabetes history: Type 2 Outpatient Diabetes medications: Novolog mix 70/30 30 units bid Current orders for Inpatient glycemic control: Novolog sensitive correction 0-9 units tid, Novolog 0-5 units qhs, Lantus 10 units qhs  Inpatient Diabetes Program Recommendations:  Consider increasing Lantus to 21 units (1/2 home basal rate) and  increase Novolog correction to moderate scale 0-15 units tid.   [place] Fitz, RN, BA, MHA, CDE Diabetes Coordinator Inpatient Diabetes Program  [phone] (Team Pager) [phone] ([place]) 05/06/2016 9:57 AM

## 2016-05-06 NOTE — Progress Notes (Signed)
SLP Cancellation Note  Patient Details Name: Gilbert CORKINS Sr. MRN: [id number] DOB: [birth year]   Cancelled treatment:       Reason Eval/Treat Not Completed: Patient at procedure or test/unavailable  Leroy Sea, MS/CCC- SLP  Lou Miner 05/06/2016, 9:18 AM

## 2016-05-07 ENCOUNTER — Inpatient Hospital Stay: Payer: Medicare Other

## 2016-05-07 DIAGNOSIS — G40909 Epilepsy, unspecified, not intractable, without status epilepticus: Secondary | ICD-10-CM

## 2016-05-07 DIAGNOSIS — R569 Unspecified convulsions: Secondary | ICD-10-CM

## 2016-05-07 LAB — GLUCOSE, CAPILLARY
GLUCOSE-CAPILLARY: 176 mg/dL — AB (ref 65–99)
Glucose-Capillary: 225 mg/dL — ABNORMAL HIGH (ref 65–99)
Glucose-Capillary: 254 mg/dL — ABNORMAL HIGH (ref 65–99)
Glucose-Capillary: 165 mg/dL — ABNORMAL HIGH (ref 65–99)

## 2016-05-07 LAB — HEMOGLOBIN A1C
Hgb A1c MFr Bld: 8.5 % — ABNORMAL HIGH (ref 4.8–5.6)
Mean Plasma Glucose: 197 mg/dL

## 2016-05-07 LAB — APTT: APTT: 34 s (ref 24–36)

## 2016-05-07 LAB — CBC
HEMATOCRIT: 37.8 % — AB (ref 40.0–52.0)
Hemoglobin: 12.9 g/dL — ABNORMAL LOW (ref 13.0–18.0)
MCH: 26.9 pg (ref 26.0–34.0)
MCHC: 34.1 g/dL (ref 32.0–36.0)
MCV: 78.9 fL — AB (ref 80.0–100.0)
Platelets: 168 10*3/uL (ref 150–440)
RBC: 4.8 MIL/uL (ref 4.40–5.90)
RDW: 15.1 % — AB (ref 11.5–14.5)
WBC: 7.8 10*3/uL (ref 3.8–10.6)

## 2016-05-07 LAB — PROTIME-INR
INR: 0.86
Prothrombin Time: 11.7 seconds (ref 11.4–15.2)

## 2016-05-07 MED ORDER — LORAZEPAM 2 MG/ML IJ SOLN
2.0000 mg | Freq: Once | INTRAMUSCULAR | Status: AC
  Administered 2016-05-07: 10:00:00 2 mg via INTRAMUSCULAR
  Filled 2016-05-07: qty 1

## 2016-05-07 MED ORDER — SODIUM CHLORIDE 0.9 % IV SOLN
500.0000 mg | Freq: Two times a day (BID) | INTRAVENOUS | Status: DC
  Administered 2016-05-07 (×2): 500 mg via INTRAVENOUS
  Filled 2016-05-07 (×4): qty 5

## 2016-05-07 MED ORDER — ATENOLOL 50 MG PO TABS
25.0000 mg | ORAL_TABLET | Freq: Every day | ORAL | Status: DC
  Administered 2016-05-09 – 2016-05-10 (×2): 25 mg via ORAL
  Filled 2016-05-07 (×3): qty 1

## 2016-05-07 MED ORDER — LORAZEPAM 2 MG/ML IJ SOLN
2.0000 mg | INTRAMUSCULAR | Status: DC | PRN
  Administered 2016-05-07: 2 mg via INTRAVENOUS
  Filled 2016-05-07: qty 1

## 2016-05-07 MED ORDER — SODIUM CHLORIDE 0.9 % IV SOLN: 1000.0000 mg | Freq: Two times a day (BID) | INTRAVENOUS | Status: DC

## 2016-05-07 MED ORDER — LISINOPRIL 20 MG PO TABS
20.0000 mg | ORAL_TABLET | Freq: Every day | ORAL | Status: DC
  Administered 2016-05-07 – 2016-05-08 (×2): 20 mg via ORAL
  Filled 2016-05-07 (×2): qty 1

## 2016-05-07 NOTE — Progress Notes (Signed)
[facility] NAME: Gilbert [name]    MR#:  [id number]  DATE OF BIRTH:  [birth year]  SUBJECTIVE:  CHIEF COMPLAINT:   Chief Complaint  Patient presents with  . Fatigue   - admitted with significant expressive aphasia and dysphasia yesterday - had a staring spell for 90 seconds this morning, followed by a violent/disruptive behaviour- requiring ativan  REVIEW OF SYSTEMS:  Review of Systems  Unable to perform ROS: Mental acuity    DRUG ALLERGIES:  No Known Allergies  VITALS:  Blood pressure (!) 182/74, pulse 71, temperature 97.8 F (36.6 C), temperature source Oral, resp. rate 20, height 6' (1.829 m), weight 90 kg (198 lb 8 oz), SpO2 93 %.  PHYSICAL EXAMINATION:  Physical Exam  GENERAL:  76 y.o.-year-old patient walking in the hallway confused, with no acute distress.  EYES: Pupils equal, round, reactive to light and accommodation. No scleral icterus. Extraocular muscles intact.  HEENT: Head atraumatic, normocephalic. Oropharynx and nasopharynx clear.  NECK:  Supple, no jugular venous distention. No thyroid enlargement, no tenderness.  LUNGS: Normal breath sounds bilaterally, no wheezing, rales,rhonchi or crepitation. No use of accessory muscles of respiration.  CARDIOVASCULAR: S1, S2 normal. No murmurs, rubs, or gallops.  ABDOMEN: Soft, nontender, nondistended. Bowel sounds present. No organomegaly or mass.  EXTREMITIES: No pedal edema, cyanosis, or clubbing.  NEUROLOGIC: minimal right facial droop noted. Not cooperating for a complete neuro exam, saw him walk without any trouble. No other focal deficits. Has expressive aphasia, speech is more clear today Sensation intact.  PSYCHIATRIC: The patient is alert but appears disoriented SKIN: No obvious rash, lesion, or ulcer.    LABORATORY PANEL:   CBC  Recent Labs Lab 05/07/16 0958  WBC 7.8  HGB 12.9*  HCT 37.8*  PLT 168    ------------------------------------------------------------------------------------------------------------------  Chemistries   Recent Labs Lab 05/05/16 1619 05/06/16 0444  NA 138 143  K 4.0 3.7  CL 106 109  CO2 25 29  GLUCOSE 341* 193*  BUN 19 17  CREATININE 1.53* 1.37*  CALCIUM 8.5* 8.5*  AST 28  --   ALT 22  --   ALKPHOS 104  --   BILITOT <0.1*  --    ------------------------------------------------------------------------------------------------------------------  Cardiac Enzymes  Recent Labs Lab 05/05/16 1619  TROPONINI <0.03   ------------------------------------------------------------------------------------------------------------------  RADIOLOGY:  Ct Angio Head W Or Wo Contrast  Result Date: 05/06/2016 CLINICAL DATA:  76 year old male with fatigue and confusion. Prior carotid stent/endarterectomy. Left carotid stenosis on Doppler ultrasound today. Initial encounter. EXAM: CT ANGIOGRAPHY HEAD AND NECK TECHNIQUE: Multidetector CT imaging of the head and neck was performed using the standard protocol during bolus administration of intravenous contrast. Multiplanar CT image reconstructions and MIPs were obtained to evaluate the vascular anatomy. Carotid stenosis measurements (when applicable) are obtained utilizing NASCET criteria, using the distal internal carotid diameter as the denominator. CONTRAST:  75 mL Isovue 370 COMPARISON:  Brain MRI 0955 hours today. Head CT without contrast 05/05/2016. Carotid Doppler ultrasound 0833 hours today. FINDINGS: CTA NECK Skeleton: Fairly poor residual dentition. Mild for age cervical spine degeneration. No acute osseous abnormality identified. Stable paranasal sinuses and mastoids. Upper chest: Dependent pulmonary atelectasis. Suggestion of some superimposed centrilobular emphysema. No superior mediastinal lymphadenopathy, with scattered coarsely calcified mediastinal lymph nodes. Other neck: Negative thyroid, larynx, pharynx,  parapharyngeal spaces, retropharyngeal space, sublingual space, submandibular glands and parotid glands. No cervical lymphadenopathy. Aortic arch: 4 vessel arch configuration, the left vertebral artery arises directly from the  arch. Moderate soft and calcified arch atherosclerosis. Right carotid system: No brachiocephalic artery stenosis despite soft and calcified plaque. Mildly tortuous proximal right CCA. Mostly medial mild soft and calcified plaque proximal to the bifurcation without stenosis. At the right carotid bifurcation there is mild soft and calcified plaque but the right ICA origin and bulb are widely patent. Negative cervical right ICA otherwise. Left carotid system: No left CCA origin stenosis. Anterior and medial soft and calcified plaque in the left CCA proximal to the bifurcation including rather bulky low-density plaque at the level of the larynx (series 4, image 73), but this is not hemodynamically significant. At the left carotid bifurcation there is bulky soft and calcified plaque affecting the left ICA origin and bulb and resulting in high-grade stenosis numerically estimated at 60 % with respect to the distal vessel, but may be mildly underestimated. Distal to the bulb the cervical left ICA is negative. Vertebral arteries:No proximal right subclavian artery stenosis despite calcified plaque. Calcified plaque at the right vertebral artery origin but no hemodynamically significant stenosis suspected (series 7, image 125). Tortuous right V2 segment but otherwise negative right vertebral artery to the skullbase. Left vertebral artery arises directly from the arch with adjacent soft and calcified plaque, but no definite origins stenosis. However, there is proximal V1 calcified plaque resulting in mild to moderate stenosis in the superior mediastinum (series 6, image 324). Mildly tortuous left V1 segment. The left vertebral is mildly non dominant but otherwise normal to the skullbase. CTA HEAD  Posterior circulation: No distal vertebral artery stenosis, the right is mildly dominant. Normal right PICA origin. Dominant appearing right AICA. Normal vertebrobasilar junction, AICA origins, bilateral SCA and bilateral PCA origins. No basilar stenosis. Posterior communicating arteries are diminutive or absent. Bilateral PCA branches are within normal limits. Anterior circulation: Both ICA siphons are patent. There is comparatively mild cavernous segment calcified atherosclerosis bilaterally without stenosis. Normal ophthalmic artery origins. Normal carotid termini, MCA and ACA origins. Mildly tortuous A1 segments. Anterior communicating artery and bilateral ACA branches are within normal limits, with a prominent median artery of the corpus callosum (normal variant). Left MCA M1 segment has an early bifurcation which is normal. The left M1 and left MCA branches are within normal limits. Right MCA M1 segment, bifurcation, and right MCA branches are within normal limits. Venous sinuses: Patent. Anatomic variants: Left vertebral artery arises directly from the arch and is non dominant. Prominent median artery of the corpus callosum. Delayed phase: No abnormal enhancement identified. Stable gray-white matter differentiation throughout the brain. Review of the MIP images confirms the above findings IMPRESSION: 1. Negative for emergent large vessel occlusion. 2. Suspect previous right carotid endarterectomy with residual right carotid atherosclerosis but no stenosis. 3. Bulky soft and calcified left cervical carotid atherosclerosis, including involvement of the left ICA origin and bulb. Subsequent stenosis numerically estimated at 60%. 4. No significant stenosis of the dominant right vertebral artery. Soft and calcified plaque in the left V1 segment with up to moderate stenosis. 5. Comparatively little intracranial atherosclerosis with no intracranial stenosis identified. 6.  Stable CT appearance of the brain.  Electronically Signed   By: Genevie Ann M.D.   On: 05/06/2016 13:58   Ct Head Wo Contrast  Result Date: 05/05/2016 CLINICAL DATA:  Altered mental status, aphasia EXAM: CT HEAD WITHOUT CONTRAST TECHNIQUE: Contiguous axial images were obtained from the base of the skull through the vertex without intravenous contrast. COMPARISON:  None. FINDINGS: Brain: No intracranial hemorrhage, mass effect or midline shift.  Mild cerebral atrophy. Periventricular and patchy subcortical white matter decreased attenuation probable due to chronic small vessel ischemic changes. There is probable small lacunar infarct in left basal ganglia. No definite acute cortical infarction. No mass lesion is noted on this unenhanced scan. Vascular: Mild atherosclerotic calcifications of carotid siphon. Skull: No skull fracture is noted. Sinuses/Orbits: No acute findings Other: None IMPRESSION: No acute intracranial abnormality. No definite acute cortical infarction. Mild cerebral atrophy. There is periventricular and patchy subcortical white matter decreased attenuation probable due to chronic small vessel ischemic changes. Small lacunar infarct is noted in left basal ganglia. Electronically Signed   By: Lahoma Crocker M.D.   On: 05/05/2016 16:39   Ct Angio Neck W Or Wo Contrast  Result Date: 05/06/2016 CLINICAL DATA:  76 year old male with fatigue and confusion. Prior carotid stent/endarterectomy. Left carotid stenosis on Doppler ultrasound today. Initial encounter. EXAM: CT ANGIOGRAPHY HEAD AND NECK TECHNIQUE: Multidetector CT imaging of the head and neck was performed using the standard protocol during bolus administration of intravenous contrast. Multiplanar CT image reconstructions and MIPs were obtained to evaluate the vascular anatomy. Carotid stenosis measurements (when applicable) are obtained utilizing NASCET criteria, using the distal internal carotid diameter as the denominator. CONTRAST:  75 mL Isovue 370 COMPARISON:  Brain MRI 0955  hours today. Head CT without contrast 05/05/2016. Carotid Doppler ultrasound 0833 hours today. FINDINGS: CTA NECK Skeleton: Fairly poor residual dentition. Mild for age cervical spine degeneration. No acute osseous abnormality identified. Stable paranasal sinuses and mastoids. Upper chest: Dependent pulmonary atelectasis. Suggestion of some superimposed centrilobular emphysema. No superior mediastinal lymphadenopathy, with scattered coarsely calcified mediastinal lymph nodes. Other neck: Negative thyroid, larynx, pharynx, parapharyngeal spaces, retropharyngeal space, sublingual space, submandibular glands and parotid glands. No cervical lymphadenopathy. Aortic arch: 4 vessel arch configuration, the left vertebral artery arises directly from the arch. Moderate soft and calcified arch atherosclerosis. Right carotid system: No brachiocephalic artery stenosis despite soft and calcified plaque. Mildly tortuous proximal right CCA. Mostly medial mild soft and calcified plaque proximal to the bifurcation without stenosis. At the right carotid bifurcation there is mild soft and calcified plaque but the right ICA origin and bulb are widely patent. Negative cervical right ICA otherwise. Left carotid system: No left CCA origin stenosis. Anterior and medial soft and calcified plaque in the left CCA proximal to the bifurcation including rather bulky low-density plaque at the level of the larynx (series 4, image 73), but this is not hemodynamically significant. At the left carotid bifurcation there is bulky soft and calcified plaque affecting the left ICA origin and bulb and resulting in high-grade stenosis numerically estimated at 60 % with respect to the distal vessel, but may be mildly underestimated. Distal to the bulb the cervical left ICA is negative. Vertebral arteries:No proximal right subclavian artery stenosis despite calcified plaque. Calcified plaque at the right vertebral artery origin but no hemodynamically  significant stenosis suspected (series 7, image 125). Tortuous right V2 segment but otherwise negative right vertebral artery to the skullbase. Left vertebral artery arises directly from the arch with adjacent soft and calcified plaque, but no definite origins stenosis. However, there is proximal V1 calcified plaque resulting in mild to moderate stenosis in the superior mediastinum (series 6, image 324). Mildly tortuous left V1 segment. The left vertebral is mildly non dominant but otherwise normal to the skullbase. CTA HEAD Posterior circulation: No distal vertebral artery stenosis, the right is mildly dominant. Normal right PICA origin. Dominant appearing right AICA. Normal vertebrobasilar junction, AICA origins, bilateral  SCA and bilateral PCA origins. No basilar stenosis. Posterior communicating arteries are diminutive or absent. Bilateral PCA branches are within normal limits. Anterior circulation: Both ICA siphons are patent. There is comparatively mild cavernous segment calcified atherosclerosis bilaterally without stenosis. Normal ophthalmic artery origins. Normal carotid termini, MCA and ACA origins. Mildly tortuous A1 segments. Anterior communicating artery and bilateral ACA branches are within normal limits, with a prominent median artery of the corpus callosum (normal variant). Left MCA M1 segment has an early bifurcation which is normal. The left M1 and left MCA branches are within normal limits. Right MCA M1 segment, bifurcation, and right MCA branches are within normal limits. Venous sinuses: Patent. Anatomic variants: Left vertebral artery arises directly from the arch and is non dominant. Prominent median artery of the corpus callosum. Delayed phase: No abnormal enhancement identified. Stable gray-white matter differentiation throughout the brain. Review of the MIP images confirms the above findings IMPRESSION: 1. Negative for emergent large vessel occlusion. 2. Suspect previous right carotid  endarterectomy with residual right carotid atherosclerosis but no stenosis. 3. Bulky soft and calcified left cervical carotid atherosclerosis, including involvement of the left ICA origin and bulb. Subsequent stenosis numerically estimated at 60%. 4. No significant stenosis of the dominant right vertebral artery. Soft and calcified plaque in the left V1 segment with up to moderate stenosis. 5. Comparatively little intracranial atherosclerosis with no intracranial stenosis identified. 6.  Stable CT appearance of the brain. Electronically Signed   By: Genevie Ann M.D.   On: 05/06/2016 13:58   Mr Brain Wo Contrast  Result Date: 05/06/2016 CLINICAL DATA:  76 year old male with fatigue and confusion. Initial encounter. EXAM: MRI HEAD WITHOUT CONTRAST TECHNIQUE: Multiplanar, multiecho pulse sequences of the brain and surrounding structures were obtained without intravenous contrast. COMPARISON:  Head CT without contrast 05/05/2016. FINDINGS: Brain: Trace right anterior subdural hygroma (series 8, image 15), evident on the CT yesterday and without associated intracranial mass effect. No other extra-axial collection suspected. No acute intracranial hemorrhage identified. No restricted diffusion to suggest acute infarction. No midline shift, mass effect, evidence of mass lesion, or ventriculomegaly. Cervicomedullary junction and pituitary are within normal limits. Patchy and confluent bilateral cerebral white matter T2 and FLAIR hyperintensity with no associated cortical encephalomalacia. No definite chronic cerebral blood products. There is a chronic lacunar infarct of the left lentiform nuclei, with otherwise normal for age deep gray matter signal. Brainstem and cerebellum are normal for age. Vascular: Major intracranial vascular flow voids are preserved. Skull and upper cervical spine: Negative. Sinuses/Orbits: Postoperative changes to both globes, otherwise normal orbits soft tissues. Mild right sphenoid sinus mucosal  thickening. Other paranasal sinuses and mastoids are stable and well pneumatized. Other: Visible internal auditory structures appear normal. Negative scalp soft tissues. IMPRESSION: 1. There is a trace right anterior frontal convexity subdural hygroma (series 3, image 15), but this could be chronic and appears inconsequential. 2. No acute infarct or acute intracranial abnormality. 3. Moderate for age cerebral signal changes are most likely chronic small vessel disease related. Electronically Signed   By: Genevie Ann M.D.   On: 05/06/2016 10:27   US Carotid Bilateral  Result Date: 05/06/2016 CLINICAL DATA:  76 year old male with a history of facial droop. Cardiovascular risk factors include hypertension, known vascular surgery with prior carotid stent on the right/endarterectomy, known prior stroke, diabetes EXAM: BILATERAL CAROTID DUPLEX ULTRASOUND TECHNIQUE: Pearline Cables scale imaging, color Doppler and duplex ultrasound were performed of bilateral carotid and vertebral arteries in the neck. COMPARISON:  Report 05/09/2001 FINDINGS:  Criteria: Quantification of carotid stenosis is based on velocity parameters that correlate the residual internal carotid diameter with NASCET-based stenosis levels, using the diameter of the distal internal carotid lumen as the denominator for stenosis measurement. The following velocity measurements were obtained: RIGHT ICA:  Systolic 702 cm/sec, Diastolic 31 cm/sec CCA:  84 cm/sec SYSTOLIC ICA/CCA RATIO:  1.6 ECA:  124 cm/sec LEFT ICA:  Systolic 637 cm/sec, Diastolic 49 cm/sec CCA:  858 cm/sec SYSTOLIC ICA/CCA RATIO:  1.5 ECA:  182 cm/sec Right Brachial SBP: Not acquired Left Brachial SBP: Not acquired RIGHT CAROTID ARTERY: No significant calcifications of the right common carotid artery. Intermediate waveform maintained. Heterogeneous and partially calcified plaque at the right carotid bifurcation. No significant lumen shadowing. Low resistance waveform of the right ICA. No significant  tortuosity. No visualize stent. RIGHT VERTEBRAL ARTERY: Antegrade flow with low resistance waveform. LEFT CAROTID ARTERY: No significant calcifications of the left common carotid artery. Intermediate waveform maintained. Heterogeneous and partially calcified plaque at the left carotid bifurcation. Luminal shadowing present. Low resistance waveform of the left ICA. No significant tortuosity. LEFT VERTEBRAL ARTERY:  Antegrade flow with low resistance waveform. IMPRESSION: Right: Note that the duplex criteria have not been validated in postoperative endarterectomy patients, which is the given history in this patient, however, there is no evidence of high-grade stenosis evident by the waveform. Left: Heterogeneous and partially calcified plaque at the carotid bifurcation, with discordant results regarding degree of stenosis by established duplex criteria. Peak velocity suggests 50%- 69% stenosis, with the ICA/ CCA ratio suggesting a lesser degree of stenosis. If establishing a more accurate degree of stenosis is required, cerebral angiogram should be considered, or as a second best test, CTA. Signed, Dulcy Fanny. Earleen Newport, DO [facility] Electronically Signed   By: Corrie Mckusick D.O.   On: 05/06/2016 09:36    EKG:   Orders placed or performed during the hospital encounter of 05/05/16  . EKG 12-Lead  . EKG 12-Lead  . ED EKG  . ED EKG    ASSESSMENT AND PLAN:   76 year old male with past medical history significant for diabetes, hypertension presents to the hospital secondary to expressive aphasia and confusion.  #1 acute CVA-has MRI negative CVA in the left temporal region. -CT head positive for small lacunar infarct in the left basal ganglia region. Echocardiogram with normal ejection fraction but no cardiac source of emboli. -Carotid Dopplers with 60% left carotid atherosclerosis, patient has had right carotid endarterectomy done in the past with no  significant stenosis now -EEG showing slowing in the left temporal region likely from stroke. -Appreciate G consult. Aspirin and Plavix and statin are recommended. -Blood pressure controlled. -We'll need outpatient physical therapy and speech therapy.  #2 acute confusion-could be postictal episode. -Resting with Ativan now. Continue Ativan as needed at this time. It does not improve, might have to consider doing a lumbar puncture.  #3 staring spell-could be from seizure. Keppra started.  #4 hypertension-on Norvasc, lisinopril and atenolol  #5 diabetes mellitus-on sliding scale insulin and Lantus  #6 DVT prophylaxis-on Lovenox    All the records are reviewed and case discussed with Care Management/Social Workerr. Management plans discussed with the patient, family and they are in agreement.  CODE STATUS: Full Code  TOTAL TIME TAKING CARE OF THIS PATIENT: 38 minutes.   POSSIBLE D/C IN 1-2 DAYS, DEPENDING ON CLINICAL CONDITION.   Gladstone Lighter M.D on 05/07/2016 at 11:54 AM  Between 7am to 6pm - Pager - [phone]  After 6pm go to [email or web address] - password [facility]  Office  [phone]  CC: Primary care physician; Golden Pop, MD

## 2016-05-07 NOTE — Progress Notes (Signed)
Pt refuses to keep heart monitor and pulse ox on.

## 2016-05-07 NOTE — Progress Notes (Signed)
Inpatient Diabetes Program Recommendations  AACE/ADA: New Consensus Statement on Inpatient Glycemic Control (2015)  Target Ranges:  Prepandial:   less than 140 mg/dL      Peak postprandial:   less than 180 mg/dL (1-2 hours)      Critically ill patients:  140 - 180 mg/dL   Lab Results  Component Value Date   GLUCAP 225 (H) 05/07/2016   HGBA1C 8.5 (H) 05/06/2016    Review of Glycemic Control  Results for [name], [name] (MRN [id number]) as of 05/07/2016 09:10  Ref. Range 05/05/2016 22:57 05/06/2016 12:00 05/06/2016 17:22 05/06/2016 21:20 05/07/2016 07:35  Glucose-Capillary Latest Ref Range: 65 - 99 mg/dL 245 (H) 229 (H) 264 (H) 201 (H) 225 (H)    Diabetes history: Type 2 Outpatient Diabetes medications: Novolog mix 70/30 30 units bid Current orders for Inpatient glycemic control: Novolog sensitive correction 0-9 units tid, Novolog 0-5 units qhs, Lantus 10 units qhs  Inpatient Diabetes Program Recommendations:  CBG remain greater than 200mg /dl consistently- Consider increasing Lantus to 21 units (1/2 home basal rate) and  increase Novolog correction to moderate scale 0-15 units tid.   Gilbert Fitz, RN, BA, MHA, CDE Diabetes Coordinator Inpatient Diabetes Program  [phone] (Team Pager) [phone] ([place]) 05/07/2016 9:11 AM

## 2016-05-07 NOTE — Care Management Important Message (Signed)
Important Message  Patient Details  Name: Gilbert VILLELLA Sr. MRN: [id number] Date of Birth: 01-14-1941   Medicare Important Message Given:  Yes    Shelbie Ammons, RN 05/07/2016, 8:30 AM

## 2016-05-07 NOTE — Progress Notes (Signed)
PT Cancellation Note  Patient Details Name: Gilbert REPPUCCI Sr. MRN: [id number] DOB: 09/18/1940   Cancelled Treatment:    Reason Eval/Treat Not Completed: Patient's level of consciousness Pt was confused and agitated this AM, he was given meds to calm him and is now very lethargic.  Pt likely will not be appropriate for PT the remainder of the day.  Kreg Shropshire, DPT 05/07/2016, 11:41 AM

## 2016-05-07 NOTE — Progress Notes (Addendum)
SLP Cancellation Note  Patient Details Name: Gilbert [name] Sr. MRN: [id number] DOB: [birth year]   Cancelled treatment:       Reason Eval/Treat Not Completed: Medical issues which prohibited therapy   The patient is agitated this AM and not able to participate in formal aphasia evaluation.  SLP spoke with family members RE: aphasia and provided education RE: maximizing communication while patient is agitated.  Family assured that SLP department is following and are available for questions and to initiate formal assessment as appropriate.    SLP will plan to follow up with patient on Monday, 05/11/2015.   Leroy Sea, MS/CCC- SLP  Lou Miner 05/07/2016, 11:51 AM

## 2016-05-07 NOTE — Progress Notes (Signed)
OT Cancellation Note  Patient Details Name: Gilbert HOWERTER Sr. MRN: [id number] DOB: Mar 28, 1941   Cancelled Treatment:    Reason Eval/Treat Not Completed: Medical issues which prohibited therapy  Harrel Carina, MS, OTR/L 05/07/2016, 11:43 AM

## 2016-05-07 NOTE — Progress Notes (Signed)
Ms. Gilbert [name] has been at [facility] on 05/07/16 visiting her father Mr. [name] who is admitted here for an acute medical condition. So, please excuse her from work today.  Please call if any questions.  Thank you.  Gladstone Lighter, M.D [facility] physicians [facility] center [address] [place] [address] Ph: [phone]

## 2016-05-07 NOTE — Progress Notes (Signed)
BP 195/81.  MD notified.  IV Labetol ordered.

## 2016-05-07 NOTE — Progress Notes (Signed)
Pt noted to be staring straight forward with arms crossed across chest.  Previously he had been interacting with nurse and family. He did not respond with any movement to voice or painful stimuli to left thumbnail.  This lasted approximately 90 seconds then he pulled hand away from stimuli. Dr Tressia Miners paged

## 2016-05-07 NOTE — Progress Notes (Signed)
PT Cancellation Note  Patient Details Name: ABDULHADI STOPA Sr. MRN: [id number] DOB: 1940-10-06   Cancelled Treatment:    Reason Eval/Treat Not Completed: Patient at procedure or test/unavailable Pt out of room for EEG at this time, will hold PT today and try to see him tomorrow as appropriate.   Kreg Shropshire, DPT 05/07/2016, 5:07 PM

## 2016-05-07 NOTE — Progress Notes (Signed)
Subjective: Patient with a starring episode this morning then became combative.  Has received Ativan prior to my evaluation.    Objective: Current vital signs: BP (!) 182/74   Pulse 71   Temp 97.8 F (36.6 C) (Oral)   Resp 20   Ht 6' (1.829 m)   Wt 90 kg (198 lb 8 oz)   SpO2 93%   BMI 26.92 kg/m  Vital signs in last 24 hours: Temp:  [97.7 F (36.5 C)-98.4 F (36.9 C)] 97.8 F (36.6 C) (01/12 0736) Pulse Rate:  [71-88] 71 (01/12 0736) Resp:  [18-20] 20 (01/12 0448) BP: (158-204)/(74-84) 182/74 (01/12 0736) SpO2:  [92 %-94 %] 93 % (01/12 0736)  Intake/Output from previous day: 01/11 0701 - 01/12 0700 In: 1534 [P.O.:240; I.V.:1294] Out: -  Intake/Output this shift: No intake/output data recorded. Nutritional status: Diet heart healthy/carb modified Room service appropriate? Yes; Fluid consistency: Thin  Neurologic Exam: Mental Status: Lethargic.  Awakens only with groans.  Does not follow commands.   Cranial Nerves: II: upils equal, round, reactive to light and accommodation III,IV, VI: ptosis not present, extra-ocular motions intact bilaterally V,VII: right facial droop VIII: hearing normal bilaterally IX,X: gag reflex unable to be tested XI: unable to test XII: unable to test Motor: Moves all extremities with LUE preferential to RUE   Lab Results: Basic Metabolic Panel:  Recent Labs Lab 05/05/16 1619 05/06/16 0444  NA 138 143  K 4.0 3.7  CL 106 109  CO2 25 29  GLUCOSE 341* 193*  BUN 19 17  CREATININE 1.53* 1.37*  CALCIUM 8.5* 8.5*    Liver Function Tests:  Recent Labs Lab 05/05/16 1619  AST 28  ALT 22  ALKPHOS 104  BILITOT <0.1*  PROT 7.2  ALBUMIN 3.7   No results for input(s): LIPASE, AMYLASE in the last 168 hours. No results for input(s): AMMONIA in the last 168 hours.  CBC:  Recent Labs Lab 05/05/16 1619 05/06/16 0444 05/07/16 0958  WBC 6.6 6.3 7.8  NEUTROABS 3.5  --   --   HGB 12.5* 11.9* 12.9*  HCT 38.4* 35.8* 37.8*  MCV  79.9* 78.9* 78.9*  PLT 157 145* 168    Cardiac Enzymes:  Recent Labs Lab 05/05/16 1619  TROPONINI <0.03    Lipid Panel:  Recent Labs Lab 05/06/16 0444  CHOL 132  TRIG 212*  HDL 29*  CHOLHDL 4.6  VLDL 42*  LDLCALC 61    CBG:  Recent Labs Lab 05/06/16 1200 05/06/16 1722 05/06/16 2120 05/07/16 0735 05/07/16 1123  GLUCAP 229* 264* 201* 225* 254*    Microbiology: No results found for this or any previous visit.  Coagulation Studies:  Recent Labs  05/05/16 1619  LABPROT 12.9  INR 0.97    Imaging: Ct Angio Head W Or Wo Contrast  Result Date: 05/06/2016 CLINICAL DATA:  76 year old male with fatigue and confusion. Prior carotid stent/endarterectomy. Left carotid stenosis on Doppler ultrasound today. Initial encounter. EXAM: CT ANGIOGRAPHY HEAD AND NECK TECHNIQUE: Multidetector CT imaging of the head and neck was performed using the standard protocol during bolus administration of intravenous contrast. Multiplanar CT image reconstructions and MIPs were obtained to evaluate the vascular anatomy. Carotid stenosis measurements (when applicable) are obtained utilizing NASCET criteria, using the distal internal carotid diameter as the denominator. CONTRAST:  75 mL Isovue 370 COMPARISON:  Brain MRI 0955 hours today. Head CT without contrast 05/05/2016. Carotid Doppler ultrasound 0833 hours today. FINDINGS: CTA NECK Skeleton: Fairly poor residual dentition. Mild for age cervical spine  degeneration. No acute osseous abnormality identified. Stable paranasal sinuses and mastoids. Upper chest: Dependent pulmonary atelectasis. Suggestion of some superimposed centrilobular emphysema. No superior mediastinal lymphadenopathy, with scattered coarsely calcified mediastinal lymph nodes. Other neck: Negative thyroid, larynx, pharynx, parapharyngeal spaces, retropharyngeal space, sublingual space, submandibular glands and parotid glands. No cervical lymphadenopathy. Aortic arch: 4 vessel arch  configuration, the left vertebral artery arises directly from the arch. Moderate soft and calcified arch atherosclerosis. Right carotid system: No brachiocephalic artery stenosis despite soft and calcified plaque. Mildly tortuous proximal right CCA. Mostly medial mild soft and calcified plaque proximal to the bifurcation without stenosis. At the right carotid bifurcation there is mild soft and calcified plaque but the right ICA origin and bulb are widely patent. Negative cervical right ICA otherwise. Left carotid system: No left CCA origin stenosis. Anterior and medial soft and calcified plaque in the left CCA proximal to the bifurcation including rather bulky low-density plaque at the level of the larynx (series 4, image 73), but this is not hemodynamically significant. At the left carotid bifurcation there is bulky soft and calcified plaque affecting the left ICA origin and bulb and resulting in high-grade stenosis numerically estimated at 60 % with respect to the distal vessel, but may be mildly underestimated. Distal to the bulb the cervical left ICA is negative. Vertebral arteries:No proximal right subclavian artery stenosis despite calcified plaque. Calcified plaque at the right vertebral artery origin but no hemodynamically significant stenosis suspected (series 7, image 125). Tortuous right V2 segment but otherwise negative right vertebral artery to the skullbase. Left vertebral artery arises directly from the arch with adjacent soft and calcified plaque, but no definite origins stenosis. However, there is proximal V1 calcified plaque resulting in mild to moderate stenosis in the superior mediastinum (series 6, image 324). Mildly tortuous left V1 segment. The left vertebral is mildly non dominant but otherwise normal to the skullbase. CTA HEAD Posterior circulation: No distal vertebral artery stenosis, the right is mildly dominant. Normal right PICA origin. Dominant appearing right AICA. Normal  vertebrobasilar junction, AICA origins, bilateral SCA and bilateral PCA origins. No basilar stenosis. Posterior communicating arteries are diminutive or absent. Bilateral PCA branches are within normal limits. Anterior circulation: Both ICA siphons are patent. There is comparatively mild cavernous segment calcified atherosclerosis bilaterally without stenosis. Normal ophthalmic artery origins. Normal carotid termini, MCA and ACA origins. Mildly tortuous A1 segments. Anterior communicating artery and bilateral ACA branches are within normal limits, with a prominent median artery of the corpus callosum (normal variant). Left MCA M1 segment has an early bifurcation which is normal. The left M1 and left MCA branches are within normal limits. Right MCA M1 segment, bifurcation, and right MCA branches are within normal limits. Venous sinuses: Patent. Anatomic variants: Left vertebral artery arises directly from the arch and is non dominant. Prominent median artery of the corpus callosum. Delayed phase: No abnormal enhancement identified. Stable gray-white matter differentiation throughout the brain. Review of the MIP images confirms the above findings IMPRESSION: 1. Negative for emergent large vessel occlusion. 2. Suspect previous right carotid endarterectomy with residual right carotid atherosclerosis but no stenosis. 3. Bulky soft and calcified left cervical carotid atherosclerosis, including involvement of the left ICA origin and bulb. Subsequent stenosis numerically estimated at 60%. 4. No significant stenosis of the dominant right vertebral artery. Soft and calcified plaque in the left V1 segment with up to moderate stenosis. 5. Comparatively little intracranial atherosclerosis with no intracranial stenosis identified. 6.  Stable CT appearance of the brain.  Electronically Signed   By: Genevie Ann M.D.   On: 05/06/2016 13:58   Ct Head Wo Contrast  Result Date: 05/05/2016 CLINICAL DATA:  Altered mental status, aphasia  EXAM: CT HEAD WITHOUT CONTRAST TECHNIQUE: Contiguous axial images were obtained from the base of the skull through the vertex without intravenous contrast. COMPARISON:  None. FINDINGS: Brain: No intracranial hemorrhage, mass effect or midline shift. Mild cerebral atrophy. Periventricular and patchy subcortical white matter decreased attenuation probable due to chronic small vessel ischemic changes. There is probable small lacunar infarct in left basal ganglia. No definite acute cortical infarction. No mass lesion is noted on this unenhanced scan. Vascular: Mild atherosclerotic calcifications of carotid siphon. Skull: No skull fracture is noted. Sinuses/Orbits: No acute findings Other: None IMPRESSION: No acute intracranial abnormality. No definite acute cortical infarction. Mild cerebral atrophy. There is periventricular and patchy subcortical white matter decreased attenuation probable due to chronic small vessel ischemic changes. Small lacunar infarct is noted in left basal ganglia. Electronically Signed   By: Lahoma Crocker M.D.   On: 05/05/2016 16:39   Ct Angio Neck W Or Wo Contrast  Result Date: 05/06/2016 CLINICAL DATA:  76 year old male with fatigue and confusion. Prior carotid stent/endarterectomy. Left carotid stenosis on Doppler ultrasound today. Initial encounter. EXAM: CT ANGIOGRAPHY HEAD AND NECK TECHNIQUE: Multidetector CT imaging of the head and neck was performed using the standard protocol during bolus administration of intravenous contrast. Multiplanar CT image reconstructions and MIPs were obtained to evaluate the vascular anatomy. Carotid stenosis measurements (when applicable) are obtained utilizing NASCET criteria, using the distal internal carotid diameter as the denominator. CONTRAST:  75 mL Isovue 370 COMPARISON:  Brain MRI 0955 hours today. Head CT without contrast 05/05/2016. Carotid Doppler ultrasound 0833 hours today. FINDINGS: CTA NECK Skeleton: Fairly poor residual dentition. Mild for  age cervical spine degeneration. No acute osseous abnormality identified. Stable paranasal sinuses and mastoids. Upper chest: Dependent pulmonary atelectasis. Suggestion of some superimposed centrilobular emphysema. No superior mediastinal lymphadenopathy, with scattered coarsely calcified mediastinal lymph nodes. Other neck: Negative thyroid, larynx, pharynx, parapharyngeal spaces, retropharyngeal space, sublingual space, submandibular glands and parotid glands. No cervical lymphadenopathy. Aortic arch: 4 vessel arch configuration, the left vertebral artery arises directly from the arch. Moderate soft and calcified arch atherosclerosis. Right carotid system: No brachiocephalic artery stenosis despite soft and calcified plaque. Mildly tortuous proximal right CCA. Mostly medial mild soft and calcified plaque proximal to the bifurcation without stenosis. At the right carotid bifurcation there is mild soft and calcified plaque but the right ICA origin and bulb are widely patent. Negative cervical right ICA otherwise. Left carotid system: No left CCA origin stenosis. Anterior and medial soft and calcified plaque in the left CCA proximal to the bifurcation including rather bulky low-density plaque at the level of the larynx (series 4, image 73), but this is not hemodynamically significant. At the left carotid bifurcation there is bulky soft and calcified plaque affecting the left ICA origin and bulb and resulting in high-grade stenosis numerically estimated at 60 % with respect to the distal vessel, but may be mildly underestimated. Distal to the bulb the cervical left ICA is negative. Vertebral arteries:No proximal right subclavian artery stenosis despite calcified plaque. Calcified plaque at the right vertebral artery origin but no hemodynamically significant stenosis suspected (series 7, image 125). Tortuous right V2 segment but otherwise negative right vertebral artery to the skullbase. Left vertebral artery arises  directly from the arch with adjacent soft and calcified plaque, but no definite origins  stenosis. However, there is proximal V1 calcified plaque resulting in mild to moderate stenosis in the superior mediastinum (series 6, image 324). Mildly tortuous left V1 segment. The left vertebral is mildly non dominant but otherwise normal to the skullbase. CTA HEAD Posterior circulation: No distal vertebral artery stenosis, the right is mildly dominant. Normal right PICA origin. Dominant appearing right AICA. Normal vertebrobasilar junction, AICA origins, bilateral SCA and bilateral PCA origins. No basilar stenosis. Posterior communicating arteries are diminutive or absent. Bilateral PCA branches are within normal limits. Anterior circulation: Both ICA siphons are patent. There is comparatively mild cavernous segment calcified atherosclerosis bilaterally without stenosis. Normal ophthalmic artery origins. Normal carotid termini, MCA and ACA origins. Mildly tortuous A1 segments. Anterior communicating artery and bilateral ACA branches are within normal limits, with a prominent median artery of the corpus callosum (normal variant). Left MCA M1 segment has an early bifurcation which is normal. The left M1 and left MCA branches are within normal limits. Right MCA M1 segment, bifurcation, and right MCA branches are within normal limits. Venous sinuses: Patent. Anatomic variants: Left vertebral artery arises directly from the arch and is non dominant. Prominent median artery of the corpus callosum. Delayed phase: No abnormal enhancement identified. Stable gray-white matter differentiation throughout the brain. Review of the MIP images confirms the above findings IMPRESSION: 1. Negative for emergent large vessel occlusion. 2. Suspect previous right carotid endarterectomy with residual right carotid atherosclerosis but no stenosis. 3. Bulky soft and calcified left cervical carotid atherosclerosis, including involvement of the left  ICA origin and bulb. Subsequent stenosis numerically estimated at 60%. 4. No significant stenosis of the dominant right vertebral artery. Soft and calcified plaque in the left V1 segment with up to moderate stenosis. 5. Comparatively little intracranial atherosclerosis with no intracranial stenosis identified. 6.  Stable CT appearance of the brain. Electronically Signed   By: Genevie Ann M.D.   On: 05/06/2016 13:58   Mr Brain Wo Contrast  Result Date: 05/06/2016 CLINICAL DATA:  76 year old male with fatigue and confusion. Initial encounter. EXAM: MRI HEAD WITHOUT CONTRAST TECHNIQUE: Multiplanar, multiecho pulse sequences of the brain and surrounding structures were obtained without intravenous contrast. COMPARISON:  Head CT without contrast 05/05/2016. FINDINGS: Brain: Trace right anterior subdural hygroma (series 8, image 15), evident on the CT yesterday and without associated intracranial mass effect. No other extra-axial collection suspected. No acute intracranial hemorrhage identified. No restricted diffusion to suggest acute infarction. No midline shift, mass effect, evidence of mass lesion, or ventriculomegaly. Cervicomedullary junction and pituitary are within normal limits. Patchy and confluent bilateral cerebral white matter T2 and FLAIR hyperintensity with no associated cortical encephalomalacia. No definite chronic cerebral blood products. There is a chronic lacunar infarct of the left lentiform nuclei, with otherwise normal for age deep gray matter signal. Brainstem and cerebellum are normal for age. Vascular: Major intracranial vascular flow voids are preserved. Skull and upper cervical spine: Negative. Sinuses/Orbits: Postoperative changes to both globes, otherwise normal orbits soft tissues. Mild right sphenoid sinus mucosal thickening. Other paranasal sinuses and mastoids are stable and well pneumatized. Other: Visible internal auditory structures appear normal. Negative scalp soft tissues.  IMPRESSION: 1. There is a trace right anterior frontal convexity subdural hygroma (series 3, image 15), but this could be chronic and appears inconsequential. 2. No acute infarct or acute intracranial abnormality. 3. Moderate for age cerebral signal changes are most likely chronic small vessel disease related. Electronically Signed   By: Genevie Ann M.D.   On: 05/06/2016 10:27  US Carotid Bilateral  Result Date: 05/06/2016 CLINICAL DATA:  76 year old male with a history of facial droop. Cardiovascular risk factors include hypertension, known vascular surgery with prior carotid stent on the right/endarterectomy, known prior stroke, diabetes EXAM: BILATERAL CAROTID DUPLEX ULTRASOUND TECHNIQUE: Pearline Cables scale imaging, color Doppler and duplex ultrasound were performed of bilateral carotid and vertebral arteries in the neck. COMPARISON:  Report 05/09/2001 FINDINGS: Criteria: Quantification of carotid stenosis is based on velocity parameters that correlate the residual internal carotid diameter with NASCET-based stenosis levels, using the diameter of the distal internal carotid lumen as the denominator for stenosis measurement. The following velocity measurements were obtained: RIGHT ICA:  Systolic 182 cm/sec, Diastolic 31 cm/sec CCA:  84 cm/sec SYSTOLIC ICA/CCA RATIO:  1.6 ECA:  124 cm/sec LEFT ICA:  Systolic 993 cm/sec, Diastolic 49 cm/sec CCA:  716 cm/sec SYSTOLIC ICA/CCA RATIO:  1.5 ECA:  182 cm/sec Right Brachial SBP: Not acquired Left Brachial SBP: Not acquired RIGHT CAROTID ARTERY: No significant calcifications of the right common carotid artery. Intermediate waveform maintained. Heterogeneous and partially calcified plaque at the right carotid bifurcation. No significant lumen shadowing. Low resistance waveform of the right ICA. No significant tortuosity. No visualize stent. RIGHT VERTEBRAL ARTERY: Antegrade flow with low resistance waveform. LEFT CAROTID ARTERY: No significant calcifications of the left common  carotid artery. Intermediate waveform maintained. Heterogeneous and partially calcified plaque at the left carotid bifurcation. Luminal shadowing present. Low resistance waveform of the left ICA. No significant tortuosity. LEFT VERTEBRAL ARTERY:  Antegrade flow with low resistance waveform. IMPRESSION: Right: Note that the duplex criteria have not been validated in postoperative endarterectomy patients, which is the given history in this patient, however, there is no evidence of high-grade stenosis evident by the waveform. Left: Heterogeneous and partially calcified plaque at the carotid bifurcation, with discordant results regarding degree of stenosis by established duplex criteria. Peak velocity suggests 50%- 69% stenosis, with the ICA/ CCA ratio suggesting a lesser degree of stenosis. If establishing a more accurate degree of stenosis is required, cerebral angiogram should be considered, or as a second best test, CTA. Signed, Dulcy Fanny. Earleen Newport, DO [facility] Electronically Signed   By: Corrie Mckusick D.O.   On: 05/06/2016 09:36    Medications:  I have reviewed the patient's current medications. Scheduled: . amLODipine  5 mg Oral Daily  . aspirin EC  81 mg Oral Daily  . atenolol  25 mg Oral Daily  . atorvastatin  40 mg Oral q1800  . clopidogrel  75 mg Oral Daily  . enoxaparin (LOVENOX) injection  40 mg Subcutaneous Q24H  . insulin aspart  0-5 Units Subcutaneous QHS  . insulin aspart  0-9 Units Subcutaneous TID WC  . insulin glargine  10 Units Subcutaneous QHS  . levETIRAcetam  500 mg Intravenous Q12H  . lisinopril  10 mg Oral QHS    Assessment/Plan: Patient with staring episode then agitation this morning.  EEG shows left temporal slowing.  MRI further reviewed with radiology and shows no acute changes.  MR negative infarct suspected with seizure this morning.  Patient started on Keppra.   Recommendations: 1.  If patient does not  return to baseline by this afternoon would consider LP for herpes encephalitis despite the fact that afebrile.  In the meantime would continue on ASA, Plavix and Keppra.   2.  Seizure precautions 3.  PT, INR 4.  Repeat head CT    LOS: 2 days   Alexis Goodell, MD Neurology [phone]  05/07/2016  11:55 AM

## 2016-05-08 LAB — BASIC METABOLIC PANEL
ANION GAP: 8 (ref 5–15)
BUN: 19 mg/dL (ref 6–20)
CO2: 24 mmol/L (ref 22–32)
CREATININE: 1.49 mg/dL — AB (ref 0.61–1.24)
Calcium: 8.8 mg/dL — ABNORMAL LOW (ref 8.9–10.3)
Chloride: 107 mmol/L (ref 101–111)
GFR calc non Af Amer: 44 mL/min — ABNORMAL LOW (ref >60)
GFR, EST AFRICAN AMERICAN: 50 mL/min — AB (ref >60)
Glucose, Bld: 296 mg/dL — ABNORMAL HIGH (ref 65–99)
Potassium: 3.6 mmol/L (ref 3.5–5.1)
SODIUM: 139 mmol/L (ref 135–145)

## 2016-05-08 LAB — GLUCOSE, CAPILLARY
GLUCOSE-CAPILLARY: 277 mg/dL — AB (ref 65–99)
GLUCOSE-CAPILLARY: 287 mg/dL — AB (ref 65–99)
Glucose-Capillary: 211 mg/dL — ABNORMAL HIGH (ref 65–99)
Glucose-Capillary: 137 mg/dL — ABNORMAL HIGH (ref 65–99)

## 2016-05-08 MED ORDER — INSULIN GLARGINE 100 UNIT/ML ~~LOC~~ SOLN
12.0000 [IU] | Freq: Once | SUBCUTANEOUS | Status: AC
  Administered 2016-05-08: 12 [IU] via SUBCUTANEOUS
  Filled 2016-05-08: qty 0.12

## 2016-05-08 MED ORDER — VALPROATE SODIUM 500 MG/5ML IV SOLN
1000.0000 mg | Freq: Once | INTRAVENOUS | Status: AC
  Administered 2016-05-08: 13:00:00 1000 mg via INTRAVENOUS
  Filled 2016-05-08: qty 10

## 2016-05-08 MED ORDER — INSULIN GLARGINE 100 UNIT/ML ~~LOC~~ SOLN
12.0000 [IU] | Freq: Two times a day (BID) | SUBCUTANEOUS | Status: DC
  Administered 2016-05-08: 12 [IU] via SUBCUTANEOUS
  Filled 2016-05-08 (×3): qty 0.12

## 2016-05-08 MED ORDER — DIVALPROEX SODIUM 250 MG PO DR TAB
500.0000 mg | DELAYED_RELEASE_TABLET | Freq: Two times a day (BID) | ORAL | Status: DC
  Administered 2016-05-08 – 2016-05-10 (×4): 500 mg via ORAL
  Filled 2016-05-08 (×4): qty 2

## 2016-05-08 MED ORDER — HALOPERIDOL LACTATE 5 MG/ML IJ SOLN
2.0000 mg | Freq: Once | INTRAMUSCULAR | Status: AC
  Administered 2016-05-08: 2 mg via INTRAVENOUS
  Filled 2016-05-08: qty 1

## 2016-05-08 MED ORDER — HALOPERIDOL LACTATE 5 MG/ML IJ SOLN
2.0000 mg | Freq: Four times a day (QID) | INTRAMUSCULAR | Status: DC | PRN
  Administered 2016-05-08: 2 mg via INTRAVENOUS
  Filled 2016-05-08: qty 1

## 2016-05-08 MED ORDER — RISPERIDONE 0.5 MG PO TBDP: 0.5000 mg | ORAL_TABLET | Freq: Two times a day (BID) | ORAL | Status: DC

## 2016-05-08 NOTE — Progress Notes (Signed)
Patient started to become aggressive towards staff. Stated "You are a monster, get out of my sight!". Attempted to ambulate, unsteady gait, refused assistance. Ativan 2mg  IV given. Patient settled back in bed.  Kept safe and comfortable. Family ([place] daughter) at bedside with patient.

## 2016-05-08 NOTE — Progress Notes (Signed)
OT Cancellation Note  Patient Details Name: Gilbert SUHR Sr. MRN: [id number] DOB: 17-Jun-1940   Cancelled Treatment:    Reason Eval/Treat Not Completed: Medical issues which prohibited therapy. Chart reviewed, discussed with PT after spoke with primary nurse re: appropriateness for therapy. Pt just received IV haldol. OT session held. Will eval/treat at later date/time as appropriate.  Corky Sox, OTR/L 05/08/2016, 10:01 AM

## 2016-05-08 NOTE — Progress Notes (Signed)
Subjective: Patient now more awake.  Has been hallucinating (feels family members have fangs, monsters out to get him) and expressing paranoid thoughts.  Speech improved  Objective: Current vital signs: BP (!) 177/71 (BP Location: Left Arm)   Pulse 71   Temp 98.4 F (36.9 C)   Resp 16   Ht 6' (1.829 m)   Wt 90 kg (198 lb 8 oz)   SpO2 90%   BMI 26.92 kg/m  Vital signs in last 24 hours: Temp:  [98.4 F (36.9 C)-98.6 F (37 C)] 98.4 F (36.9 C) (01/13 0620) Pulse Rate:  [60-77] 71 (01/13 0928) Resp:  [16-20] 16 (01/13 0620) BP: (157-189)/(49-73) 177/71 (01/13 0928) SpO2:  [90 %-99 %] 90 % (01/13 0928)  Intake/Output from previous day: 01/12 0701 - 01/13 0700 In: 1300.7 [P.O.:360; I.V.:730.7; IV Piggyback:210] Out: 250 [Urine:250] Intake/Output this shift: No intake/output data recorded. Nutritional status: Diet heart healthy/carb modified Room service appropriate? Yes; Fluid consistency: Thin  Neurologic Exam: Mental Status: Alert.Marland Kitchen  Speech fluent without evidence of aphasia.  Able to follow 3 step commands without difficulty. Cranial Nerves: II: Discs flat bilaterally; Visual fields grossly normal, pupils equal, round, reactive to light and accommodation III,IV, VI: ptosis not present, extra-ocular motions intact bilaterally V,VII: right facial droop, facial light touch sensation normal bilaterally VIII: hearing normal bilaterally IX,X: gag reflex present XI: bilateral shoulder shrug XII: midline tongue extension Motor: Moves all extremities strongly against gravity   Lab Results: Basic Metabolic Panel:  Recent Labs Lab 05/05/16 1619 05/06/16 0444 05/08/16 0700  NA 138 143 139  K 4.0 3.7 3.6  CL 106 109 107  CO2 25 29 24   GLUCOSE 341* 193* 296*  BUN 19 17 19   CREATININE 1.53* 1.37* 1.49*  CALCIUM 8.5* 8.5* 8.8*    Liver Function Tests:  Recent Labs Lab 05/05/16 1619  AST 28  ALT 22  ALKPHOS 104  BILITOT <0.1*  PROT 7.2  ALBUMIN 3.7   No  results for input(s): LIPASE, AMYLASE in the last 168 hours. No results for input(s): AMMONIA in the last 168 hours.  CBC:  Recent Labs Lab 05/05/16 1619 05/06/16 0444 05/07/16 0958  WBC 6.6 6.3 7.8  NEUTROABS 3.5  --   --   HGB 12.5* 11.9* 12.9*  HCT 38.4* 35.8* 37.8*  MCV 79.9* 78.9* 78.9*  PLT 157 145* 168    Cardiac Enzymes:  Recent Labs Lab 05/05/16 1619  TROPONINI <0.03    Lipid Panel:  Recent Labs Lab 05/06/16 0444  CHOL 132  TRIG 212*  HDL 29*  CHOLHDL 4.6  VLDL 42*  LDLCALC 61    CBG:  Recent Labs Lab 05/07/16 1123 05/07/16 1722 05/07/16 2132 05/08/16 0922 05/08/16 1214  GLUCAP 254* 176* 165* 277* 287*    Microbiology: No results found for this or any previous visit.  Coagulation Studies:  Recent Labs  05/05/16 1619 05/07/16 1234  LABPROT 12.9 11.7  INR 0.97 0.86    Imaging: Ct Angio Head W Or Wo Contrast  Result Date: 05/06/2016 CLINICAL DATA:  76 year old male with fatigue and confusion. Prior carotid stent/endarterectomy. Left carotid stenosis on Doppler ultrasound today. Initial encounter. EXAM: CT ANGIOGRAPHY HEAD AND NECK TECHNIQUE: Multidetector CT imaging of the head and neck was performed using the standard protocol during bolus administration of intravenous contrast. Multiplanar CT image reconstructions and MIPs were obtained to evaluate the vascular anatomy. Carotid stenosis measurements (when applicable) are obtained utilizing NASCET criteria, using the distal internal carotid diameter as the denominator. CONTRAST:  75 mL Isovue 370 COMPARISON:  Brain MRI 0955 hours today. Head CT without contrast 05/05/2016. Carotid Doppler ultrasound 0833 hours today. FINDINGS: CTA NECK Skeleton: Fairly poor residual dentition. Mild for age cervical spine degeneration. No acute osseous abnormality identified. Stable paranasal sinuses and mastoids. Upper chest: Dependent pulmonary atelectasis. Suggestion of some superimposed centrilobular  emphysema. No superior mediastinal lymphadenopathy, with scattered coarsely calcified mediastinal lymph nodes. Other neck: Negative thyroid, larynx, pharynx, parapharyngeal spaces, retropharyngeal space, sublingual space, submandibular glands and parotid glands. No cervical lymphadenopathy. Aortic arch: 4 vessel arch configuration, the left vertebral artery arises directly from the arch. Moderate soft and calcified arch atherosclerosis. Right carotid system: No brachiocephalic artery stenosis despite soft and calcified plaque. Mildly tortuous proximal right CCA. Mostly medial mild soft and calcified plaque proximal to the bifurcation without stenosis. At the right carotid bifurcation there is mild soft and calcified plaque but the right ICA origin and bulb are widely patent. Negative cervical right ICA otherwise. Left carotid system: No left CCA origin stenosis. Anterior and medial soft and calcified plaque in the left CCA proximal to the bifurcation including rather bulky low-density plaque at the level of the larynx (series 4, image 73), but this is not hemodynamically significant. At the left carotid bifurcation there is bulky soft and calcified plaque affecting the left ICA origin and bulb and resulting in high-grade stenosis numerically estimated at 60 % with respect to the distal vessel, but may be mildly underestimated. Distal to the bulb the cervical left ICA is negative. Vertebral arteries:No proximal right subclavian artery stenosis despite calcified plaque. Calcified plaque at the right vertebral artery origin but no hemodynamically significant stenosis suspected (series 7, image 125). Tortuous right V2 segment but otherwise negative right vertebral artery to the skullbase. Left vertebral artery arises directly from the arch with adjacent soft and calcified plaque, but no definite origins stenosis. However, there is proximal V1 calcified plaque resulting in mild to moderate stenosis in the superior  mediastinum (series 6, image 324). Mildly tortuous left V1 segment. The left vertebral is mildly non dominant but otherwise normal to the skullbase. CTA HEAD Posterior circulation: No distal vertebral artery stenosis, the right is mildly dominant. Normal right PICA origin. Dominant appearing right AICA. Normal vertebrobasilar junction, AICA origins, bilateral SCA and bilateral PCA origins. No basilar stenosis. Posterior communicating arteries are diminutive or absent. Bilateral PCA branches are within normal limits. Anterior circulation: Both ICA siphons are patent. There is comparatively mild cavernous segment calcified atherosclerosis bilaterally without stenosis. Normal ophthalmic artery origins. Normal carotid termini, MCA and ACA origins. Mildly tortuous A1 segments. Anterior communicating artery and bilateral ACA branches are within normal limits, with a prominent median artery of the corpus callosum (normal variant). Left MCA M1 segment has an early bifurcation which is normal. The left M1 and left MCA branches are within normal limits. Right MCA M1 segment, bifurcation, and right MCA branches are within normal limits. Venous sinuses: Patent. Anatomic variants: Left vertebral artery arises directly from the arch and is non dominant. Prominent median artery of the corpus callosum. Delayed phase: No abnormal enhancement identified. Stable gray-white matter differentiation throughout the brain. Review of the MIP images confirms the above findings IMPRESSION: 1. Negative for emergent large vessel occlusion. 2. Suspect previous right carotid endarterectomy with residual right carotid atherosclerosis but no stenosis. 3. Bulky soft and calcified left cervical carotid atherosclerosis, including involvement of the left ICA origin and bulb. Subsequent stenosis numerically estimated at 60%. 4. No significant stenosis of the dominant  right vertebral artery. Soft and calcified plaque in the left V1 segment with up to  moderate stenosis. 5. Comparatively little intracranial atherosclerosis with no intracranial stenosis identified. 6.  Stable CT appearance of the brain. Electronically Signed   By: Genevie Ann M.D.   On: 05/06/2016 13:58   Ct Head Wo Contrast  Result Date: 05/07/2016 CLINICAL DATA:  Altered mental status. Left temporal slowing on EEG. EXAM: CT HEAD WITHOUT CONTRAST TECHNIQUE: Contiguous axial images were obtained from the base of the skull through the vertex without intravenous contrast. COMPARISON:  Head CT 05/05/2016 and MRI 05/06/2016 FINDINGS: Brain: Chronic left basal ganglia lacunar infarct is again noted. There is no evidence of acute cortically based infarct, intracranial hemorrhage, mass, or midline shift. Mildly prominent extra-axial CSF spaces overlying the frontal lobes are unchanged and more fully evaluated on yesterday's MRI. There is mild cerebral atrophy. Patchy cerebral white matter hypodensities are unchanged and nonspecific but compatible with moderate chronic small vessel ischemic disease. Vascular: Mild calcified atherosclerosis at the skullbase. No hyperdense vessel. Skull: No fracture or focal osseous lesion. Sinuses/Orbits: Mild paranasal sinus mucosal thickening, greatest in the right sphenoid sinus with small volume bubbly secretions. Clear mastoid air cells. Prior bilateral cataract extraction. Other: None. IMPRESSION: 1. No evidence of acute intracranial abnormality. 2. Moderate chronic small vessel ischemic disease. Electronically Signed   By: Logan Bores M.D.   On: 05/07/2016 14:17   Ct Angio Neck W Or Wo Contrast  Result Date: 05/06/2016 CLINICAL DATA:  76 year old male with fatigue and confusion. Prior carotid stent/endarterectomy. Left carotid stenosis on Doppler ultrasound today. Initial encounter. EXAM: CT ANGIOGRAPHY HEAD AND NECK TECHNIQUE: Multidetector CT imaging of the head and neck was performed using the standard protocol during bolus administration of intravenous  contrast. Multiplanar CT image reconstructions and MIPs were obtained to evaluate the vascular anatomy. Carotid stenosis measurements (when applicable) are obtained utilizing NASCET criteria, using the distal internal carotid diameter as the denominator. CONTRAST:  75 mL Isovue 370 COMPARISON:  Brain MRI 0955 hours today. Head CT without contrast 05/05/2016. Carotid Doppler ultrasound 0833 hours today. FINDINGS: CTA NECK Skeleton: Fairly poor residual dentition. Mild for age cervical spine degeneration. No acute osseous abnormality identified. Stable paranasal sinuses and mastoids. Upper chest: Dependent pulmonary atelectasis. Suggestion of some superimposed centrilobular emphysema. No superior mediastinal lymphadenopathy, with scattered coarsely calcified mediastinal lymph nodes. Other neck: Negative thyroid, larynx, pharynx, parapharyngeal spaces, retropharyngeal space, sublingual space, submandibular glands and parotid glands. No cervical lymphadenopathy. Aortic arch: 4 vessel arch configuration, the left vertebral artery arises directly from the arch. Moderate soft and calcified arch atherosclerosis. Right carotid system: No brachiocephalic artery stenosis despite soft and calcified plaque. Mildly tortuous proximal right CCA. Mostly medial mild soft and calcified plaque proximal to the bifurcation without stenosis. At the right carotid bifurcation there is mild soft and calcified plaque but the right ICA origin and bulb are widely patent. Negative cervical right ICA otherwise. Left carotid system: No left CCA origin stenosis. Anterior and medial soft and calcified plaque in the left CCA proximal to the bifurcation including rather bulky low-density plaque at the level of the larynx (series 4, image 73), but this is not hemodynamically significant. At the left carotid bifurcation there is bulky soft and calcified plaque affecting the left ICA origin and bulb and resulting in high-grade stenosis numerically  estimated at 60 % with respect to the distal vessel, but may be mildly underestimated. Distal to the bulb the cervical left ICA is negative. Vertebral  arteries:No proximal right subclavian artery stenosis despite calcified plaque. Calcified plaque at the right vertebral artery origin but no hemodynamically significant stenosis suspected (series 7, image 125). Tortuous right V2 segment but otherwise negative right vertebral artery to the skullbase. Left vertebral artery arises directly from the arch with adjacent soft and calcified plaque, but no definite origins stenosis. However, there is proximal V1 calcified plaque resulting in mild to moderate stenosis in the superior mediastinum (series 6, image 324). Mildly tortuous left V1 segment. The left vertebral is mildly non dominant but otherwise normal to the skullbase. CTA HEAD Posterior circulation: No distal vertebral artery stenosis, the right is mildly dominant. Normal right PICA origin. Dominant appearing right AICA. Normal vertebrobasilar junction, AICA origins, bilateral SCA and bilateral PCA origins. No basilar stenosis. Posterior communicating arteries are diminutive or absent. Bilateral PCA branches are within normal limits. Anterior circulation: Both ICA siphons are patent. There is comparatively mild cavernous segment calcified atherosclerosis bilaterally without stenosis. Normal ophthalmic artery origins. Normal carotid termini, MCA and ACA origins. Mildly tortuous A1 segments. Anterior communicating artery and bilateral ACA branches are within normal limits, with a prominent median artery of the corpus callosum (normal variant). Left MCA M1 segment has an early bifurcation which is normal. The left M1 and left MCA branches are within normal limits. Right MCA M1 segment, bifurcation, and right MCA branches are within normal limits. Venous sinuses: Patent. Anatomic variants: Left vertebral artery arises directly from the arch and is non dominant.  Prominent median artery of the corpus callosum. Delayed phase: No abnormal enhancement identified. Stable gray-white matter differentiation throughout the brain. Review of the MIP images confirms the above findings IMPRESSION: 1. Negative for emergent large vessel occlusion. 2. Suspect previous right carotid endarterectomy with residual right carotid atherosclerosis but no stenosis. 3. Bulky soft and calcified left cervical carotid atherosclerosis, including involvement of the left ICA origin and bulb. Subsequent stenosis numerically estimated at 60%. 4. No significant stenosis of the dominant right vertebral artery. Soft and calcified plaque in the left V1 segment with up to moderate stenosis. 5. Comparatively little intracranial atherosclerosis with no intracranial stenosis identified. 6.  Stable CT appearance of the brain. Electronically Signed   By: Genevie Ann M.D.   On: 05/06/2016 13:58    Medications:  I have reviewed the patient's current medications. Scheduled: . amLODipine  5 mg Oral Daily  . aspirin EC  81 mg Oral Daily  . atenolol  25 mg Oral Daily  . atorvastatin  40 mg Oral q1800  . clopidogrel  75 mg Oral Daily  . divalproex  500 mg Oral BID  . enoxaparin (LOVENOX) injection  40 mg Subcutaneous Q24H  . insulin aspart  0-5 Units Subcutaneous QHS  . insulin aspart  0-9 Units Subcutaneous TID WC  . insulin glargine  12 Units Subcutaneous BID  . insulin glargine  12 Units Subcutaneous Once  . lisinopril  20 mg Oral QHS  . valproate sodium  1,000 mg Intravenous Once    Assessment/Plan: Patient improved in terms of speech but has mental status changes.  Repeat head CT on yesterday was unremarkable.  EEG showed no further focal slowing.  Possibility that mental status changes may be secondary to [place] with initial changes being post-ictal.  With improvement in level of consciousness, LP not indicated at this time.    Recommendations: 1.  D/C Keppra 2.  Start Depakote 1000mg  now with  maintenance of 500mg  BID 3.  Depakote level in AM 4.  Continue  seizure precautions   LOS: 3 days   Alexis Goodell, MD Neurology [phone] 05/08/2016  1:05 PM

## 2016-05-08 NOTE — Progress Notes (Signed)
[facility] NAME: Gilbert [name]    MR#:  [id number]  DATE OF BIRTH:  17-Aug-1940  SUBJECTIVE:  CHIEF COMPLAINT:   Chief Complaint  Patient presents with  . Fatigue   -No further seizures. Has been having visual hallucinations and seeing monsters. Likely from [place]. -Responded well to Haldol this morning. -Speech is much better.  REVIEW OF SYSTEMS:  Review of Systems  Constitutional: Negative for chills, fever and malaise/fatigue.  HENT: Negative for ear discharge, hearing loss and nosebleeds.   Eyes: Negative for blurred vision.  Respiratory: Negative for cough, shortness of breath and wheezing.   Cardiovascular: Negative for chest pain, palpitations and leg swelling.  Gastrointestinal: Negative for abdominal pain, constipation, diarrhea, nausea and vomiting.  Genitourinary: Negative for dysuria.  Neurological: Negative for dizziness, speech change, focal weakness, seizures and headaches.  Psychiatric/Behavioral: Positive for hallucinations. Negative for depression.    DRUG ALLERGIES:  No Known Allergies  VITALS:  Blood pressure (!) 177/71, pulse 71, temperature 98.4 F (36.9 C), resp. rate 16, height 6' (1.829 m), weight 90 kg (198 lb 8 oz), SpO2 90 %.  PHYSICAL EXAMINATION:  Physical Exam  GENERAL:  76 y.o.-year-old patientSitting in the bed, slight confusion noted. Not in any acute distress  EYES: Pupils equal, round, reactive to light and accommodation. No scleral icterus. Extraocular muscles intact.  HEENT: Head atraumatic, normocephalic. Oropharynx and nasopharynx clear.  NECK:  Supple, no jugular venous distention. No thyroid enlargement, no tenderness.  LUNGS: Normal breath sounds bilaterally, no wheezing, rales,rhonchi or crepitation. No use of accessory muscles of respiration.  CARDIOVASCULAR: S1, S2 normal. No murmurs, rubs, or gallops.  ABDOMEN: Soft, nontender, nondistended. Bowel sounds present. No  organomegaly or mass.  EXTREMITIES: No pedal edema, cyanosis, or clubbing.  NEUROLOGIC: minimal right facial droop noted. His expressive aphasia has resolved. No focal motor deficits. Sensation intact.  PSYCHIATRIC: The patient is alert And oriented 2-3 SKIN: No obvious rash, lesion, or ulcer.    LABORATORY PANEL:   CBC  Recent Labs Lab 05/07/16 0958  WBC 7.8  HGB 12.9*  HCT 37.8*  PLT 168   ------------------------------------------------------------------------------------------------------------------  Chemistries   Recent Labs Lab 05/05/16 1619  05/08/16 0700  NA 138  < > 139  K 4.0  < > 3.6  CL 106  < > 107  CO2 25  < > 24  GLUCOSE 341*  < > 296*  BUN 19  < > 19  CREATININE 1.53*  < > 1.49*  CALCIUM 8.5*  < > 8.8*  AST 28  --   --   ALT 22  --   --   ALKPHOS 104  --   --   BILITOT <0.1*  --   --   < > = values in this interval not displayed. ------------------------------------------------------------------------------------------------------------------  Cardiac Enzymes  Recent Labs Lab 05/05/16 1619  TROPONINI <0.03   ------------------------------------------------------------------------------------------------------------------  RADIOLOGY:  Ct Angio Head W Or Wo Contrast  Result Date: 05/06/2016 CLINICAL DATA:  76 year old male with fatigue and confusion. Prior carotid stent/endarterectomy. Left carotid stenosis on Doppler ultrasound today. Initial encounter. EXAM: CT ANGIOGRAPHY HEAD AND NECK TECHNIQUE: Multidetector CT imaging of the head and neck was performed using the standard protocol during bolus administration of intravenous contrast. Multiplanar CT image reconstructions and MIPs were obtained to evaluate the vascular anatomy. Carotid stenosis measurements (when applicable) are obtained utilizing NASCET criteria, using the distal internal carotid diameter as the denominator. CONTRAST:  75 mL  Isovue 370 COMPARISON:  Brain MRI 0955 hours today.  Head CT without contrast 05/05/2016. Carotid Doppler ultrasound 0833 hours today. FINDINGS: CTA NECK Skeleton: Fairly poor residual dentition. Mild for age cervical spine degeneration. No acute osseous abnormality identified. Stable paranasal sinuses and mastoids. Upper chest: Dependent pulmonary atelectasis. Suggestion of some superimposed centrilobular emphysema. No superior mediastinal lymphadenopathy, with scattered coarsely calcified mediastinal lymph nodes. Other neck: Negative thyroid, larynx, pharynx, parapharyngeal spaces, retropharyngeal space, sublingual space, submandibular glands and parotid glands. No cervical lymphadenopathy. Aortic arch: 4 vessel arch configuration, the left vertebral artery arises directly from the arch. Moderate soft and calcified arch atherosclerosis. Right carotid system: No brachiocephalic artery stenosis despite soft and calcified plaque. Mildly tortuous proximal right CCA. Mostly medial mild soft and calcified plaque proximal to the bifurcation without stenosis. At the right carotid bifurcation there is mild soft and calcified plaque but the right ICA origin and bulb are widely patent. Negative cervical right ICA otherwise. Left carotid system: No left CCA origin stenosis. Anterior and medial soft and calcified plaque in the left CCA proximal to the bifurcation including rather bulky low-density plaque at the level of the larynx (series 4, image 73), but this is not hemodynamically significant. At the left carotid bifurcation there is bulky soft and calcified plaque affecting the left ICA origin and bulb and resulting in high-grade stenosis numerically estimated at 60 % with respect to the distal vessel, but may be mildly underestimated. Distal to the bulb the cervical left ICA is negative. Vertebral arteries:No proximal right subclavian artery stenosis despite calcified plaque. Calcified plaque at the right vertebral artery origin but no hemodynamically significant stenosis  suspected (series 7, image 125). Tortuous right V2 segment but otherwise negative right vertebral artery to the skullbase. Left vertebral artery arises directly from the arch with adjacent soft and calcified plaque, but no definite origins stenosis. However, there is proximal V1 calcified plaque resulting in mild to moderate stenosis in the superior mediastinum (series 6, image 324). Mildly tortuous left V1 segment. The left vertebral is mildly non dominant but otherwise normal to the skullbase. CTA HEAD Posterior circulation: No distal vertebral artery stenosis, the right is mildly dominant. Normal right PICA origin. Dominant appearing right AICA. Normal vertebrobasilar junction, AICA origins, bilateral SCA and bilateral PCA origins. No basilar stenosis. Posterior communicating arteries are diminutive or absent. Bilateral PCA branches are within normal limits. Anterior circulation: Both ICA siphons are patent. There is comparatively mild cavernous segment calcified atherosclerosis bilaterally without stenosis. Normal ophthalmic artery origins. Normal carotid termini, MCA and ACA origins. Mildly tortuous A1 segments. Anterior communicating artery and bilateral ACA branches are within normal limits, with a prominent median artery of the corpus callosum (normal variant). Left MCA M1 segment has an early bifurcation which is normal. The left M1 and left MCA branches are within normal limits. Right MCA M1 segment, bifurcation, and right MCA branches are within normal limits. Venous sinuses: Patent. Anatomic variants: Left vertebral artery arises directly from the arch and is non dominant. Prominent median artery of the corpus callosum. Delayed phase: No abnormal enhancement identified. Stable gray-white matter differentiation throughout the brain. Review of the MIP images confirms the above findings IMPRESSION: 1. Negative for emergent large vessel occlusion. 2. Suspect previous right carotid endarterectomy with  residual right carotid atherosclerosis but no stenosis. 3. Bulky soft and calcified left cervical carotid atherosclerosis, including involvement of the left ICA origin and bulb. Subsequent stenosis numerically estimated at 60%. 4. No significant stenosis of the dominant right  vertebral artery. Soft and calcified plaque in the left V1 segment with up to moderate stenosis. 5. Comparatively little intracranial atherosclerosis with no intracranial stenosis identified. 6.  Stable CT appearance of the brain. Electronically Signed   By: Genevie Ann M.D.   On: 05/06/2016 13:58   Ct Head Wo Contrast  Result Date: 05/07/2016 CLINICAL DATA:  Altered mental status. Left temporal slowing on EEG. EXAM: CT HEAD WITHOUT CONTRAST TECHNIQUE: Contiguous axial images were obtained from the base of the skull through the vertex without intravenous contrast. COMPARISON:  Head CT 05/05/2016 and MRI 05/06/2016 FINDINGS: Brain: Chronic left basal ganglia lacunar infarct is again noted. There is no evidence of acute cortically based infarct, intracranial hemorrhage, mass, or midline shift. Mildly prominent extra-axial CSF spaces overlying the frontal lobes are unchanged and more fully evaluated on yesterday's MRI. There is mild cerebral atrophy. Patchy cerebral white matter hypodensities are unchanged and nonspecific but compatible with moderate chronic small vessel ischemic disease. Vascular: Mild calcified atherosclerosis at the skullbase. No hyperdense vessel. Skull: No fracture or focal osseous lesion. Sinuses/Orbits: Mild paranasal sinus mucosal thickening, greatest in the right sphenoid sinus with small volume bubbly secretions. Clear mastoid air cells. Prior bilateral cataract extraction. Other: None. IMPRESSION: 1. No evidence of acute intracranial abnormality. 2. Moderate chronic small vessel ischemic disease. Electronically Signed   By: Logan Bores M.D.   On: 05/07/2016 14:17   Ct Angio Neck W Or Wo Contrast  Result Date:  05/06/2016 CLINICAL DATA:  76 year old male with fatigue and confusion. Prior carotid stent/endarterectomy. Left carotid stenosis on Doppler ultrasound today. Initial encounter. EXAM: CT ANGIOGRAPHY HEAD AND NECK TECHNIQUE: Multidetector CT imaging of the head and neck was performed using the standard protocol during bolus administration of intravenous contrast. Multiplanar CT image reconstructions and MIPs were obtained to evaluate the vascular anatomy. Carotid stenosis measurements (when applicable) are obtained utilizing NASCET criteria, using the distal internal carotid diameter as the denominator. CONTRAST:  75 mL Isovue 370 COMPARISON:  Brain MRI 0955 hours today. Head CT without contrast 05/05/2016. Carotid Doppler ultrasound 0833 hours today. FINDINGS: CTA NECK Skeleton: Fairly poor residual dentition. Mild for age cervical spine degeneration. No acute osseous abnormality identified. Stable paranasal sinuses and mastoids. Upper chest: Dependent pulmonary atelectasis. Suggestion of some superimposed centrilobular emphysema. No superior mediastinal lymphadenopathy, with scattered coarsely calcified mediastinal lymph nodes. Other neck: Negative thyroid, larynx, pharynx, parapharyngeal spaces, retropharyngeal space, sublingual space, submandibular glands and parotid glands. No cervical lymphadenopathy. Aortic arch: 4 vessel arch configuration, the left vertebral artery arises directly from the arch. Moderate soft and calcified arch atherosclerosis. Right carotid system: No brachiocephalic artery stenosis despite soft and calcified plaque. Mildly tortuous proximal right CCA. Mostly medial mild soft and calcified plaque proximal to the bifurcation without stenosis. At the right carotid bifurcation there is mild soft and calcified plaque but the right ICA origin and bulb are widely patent. Negative cervical right ICA otherwise. Left carotid system: No left CCA origin stenosis. Anterior and medial soft and calcified  plaque in the left CCA proximal to the bifurcation including rather bulky low-density plaque at the level of the larynx (series 4, image 73), but this is not hemodynamically significant. At the left carotid bifurcation there is bulky soft and calcified plaque affecting the left ICA origin and bulb and resulting in high-grade stenosis numerically estimated at 60 % with respect to the distal vessel, but may be mildly underestimated. Distal to the bulb the cervical left ICA is negative. Vertebral arteries:No proximal  right subclavian artery stenosis despite calcified plaque. Calcified plaque at the right vertebral artery origin but no hemodynamically significant stenosis suspected (series 7, image 125). Tortuous right V2 segment but otherwise negative right vertebral artery to the skullbase. Left vertebral artery arises directly from the arch with adjacent soft and calcified plaque, but no definite origins stenosis. However, there is proximal V1 calcified plaque resulting in mild to moderate stenosis in the superior mediastinum (series 6, image 324). Mildly tortuous left V1 segment. The left vertebral is mildly non dominant but otherwise normal to the skullbase. CTA HEAD Posterior circulation: No distal vertebral artery stenosis, the right is mildly dominant. Normal right PICA origin. Dominant appearing right AICA. Normal vertebrobasilar junction, AICA origins, bilateral SCA and bilateral PCA origins. No basilar stenosis. Posterior communicating arteries are diminutive or absent. Bilateral PCA branches are within normal limits. Anterior circulation: Both ICA siphons are patent. There is comparatively mild cavernous segment calcified atherosclerosis bilaterally without stenosis. Normal ophthalmic artery origins. Normal carotid termini, MCA and ACA origins. Mildly tortuous A1 segments. Anterior communicating artery and bilateral ACA branches are within normal limits, with a prominent median artery of the corpus callosum  (normal variant). Left MCA M1 segment has an early bifurcation which is normal. The left M1 and left MCA branches are within normal limits. Right MCA M1 segment, bifurcation, and right MCA branches are within normal limits. Venous sinuses: Patent. Anatomic variants: Left vertebral artery arises directly from the arch and is non dominant. Prominent median artery of the corpus callosum. Delayed phase: No abnormal enhancement identified. Stable gray-white matter differentiation throughout the brain. Review of the MIP images confirms the above findings IMPRESSION: 1. Negative for emergent large vessel occlusion. 2. Suspect previous right carotid endarterectomy with residual right carotid atherosclerosis but no stenosis. 3. Bulky soft and calcified left cervical carotid atherosclerosis, including involvement of the left ICA origin and bulb. Subsequent stenosis numerically estimated at 60%. 4. No significant stenosis of the dominant right vertebral artery. Soft and calcified plaque in the left V1 segment with up to moderate stenosis. 5. Comparatively little intracranial atherosclerosis with no intracranial stenosis identified. 6.  Stable CT appearance of the brain. Electronically Signed   By: Genevie Ann M.D.   On: 05/06/2016 13:58    EKG:   Orders placed or performed during the hospital encounter of 05/05/16  . EKG 12-Lead  . EKG 12-Lead  . ED EKG  . ED EKG    ASSESSMENT AND PLAN:   76 year old male with past medical history significant for diabetes, hypertension presents to the hospital secondary to expressive aphasia and confusion.  #1 acute CVA-has MRI negative CVA in the left temporal region. -CT head positive for small lacunar infarct in the left basal ganglia region. Echocardiogram with normal ejection fraction but no cardiac source of emboli. -Carotid Dopplers with 60% left carotid atherosclerosis, patient has had right carotid endarterectomy done in the past with no significant stenosis now -EEG  showing slowing in the left temporal region likely from stroke.Repeat EEG done from yesterday did not show any acute findings and shown improvement -Appreciate neurology consult. Aspirin and Plavix and statin are recommended. -Blood pressure control. -We'll need outpatient physical therapy and speech therapy.  #2 acute Confusion yesterday-likely from Post ictal episode yesterday. However his visual hallucinations that started last night could be from [place]. Keppra has been discontinued. Patient is more calm now. Use Haldol as needed. No further seizures. No indication for lumbar puncture at this time.  #3 staring Likely underlying  seizure. Keppra was started yesterday. However his visual hallucinations could be side effects from [place]. Keppra discontinued this morning and started on Depakote. -EEG from yesterday shows improvement compared to his prior EKG..  #4 hypertension-on Norvasc, lisinopril and atenolol  #5 diabetes mellitus-Sugars are elevated as he is not on his home dose of Lantus. Lantus dose being slowly increased. on sliding scale insulin  #6 DVT prophylaxis-on Lovenox  Anticipate discharge once his mental status improves Updated multiple family members at bedside.    All the records are reviewed and case discussed with Care Management/Social Workerr. Management plans discussed with the patient, family and they are in agreement.  CODE STATUS: Full Code  TOTAL TIME TAKING CARE OF THIS PATIENT: 38 minutes.   POSSIBLE D/C IN 1-2 DAYS, DEPENDING ON CLINICAL CONDITION.   Gladstone Lighter M.D on 05/08/2016 at 12:51 PM  Between 7am to 6pm - Pager - [phone]  After 6pm go to [email or web address] - password [email or web address]  Office  [phone]  CC: Primary care physician; Golden Pop, MD

## 2016-05-08 NOTE — Progress Notes (Signed)
PT Cancellation Note  Patient Details Name: Gilbert [name]. MRN: [id number] DOB: Aug 31, 1940   Cancelled Treatment:    Reason Eval/Treat Not Completed: Medical issues which prohibited therapy  Discussed with primary nurse.  Pt had just received IV haldol.  Session held.  Will continue as appropriate.  Chesley Noon 05/08/2016, 9:53 AM

## 2016-05-09 LAB — BASIC METABOLIC PANEL
Anion gap: 5 (ref 5–15)
BUN: 21 mg/dL — AB (ref 6–20)
CO2: 26 mmol/L (ref 22–32)
CREATININE: 1.39 mg/dL — AB (ref 0.61–1.24)
Calcium: 8.2 mg/dL — ABNORMAL LOW (ref 8.9–10.3)
Chloride: 111 mmol/L (ref 101–111)
GFR calc Af Amer: 55 mL/min — ABNORMAL LOW (ref >60)
GFR, EST NON AFRICAN AMERICAN: 47 mL/min — AB (ref >60)
Glucose, Bld: 148 mg/dL — ABNORMAL HIGH (ref 65–99)
POTASSIUM: 3.2 mmol/L — AB (ref 3.5–5.1)
SODIUM: 142 mmol/L (ref 135–145)

## 2016-05-09 LAB — GLUCOSE, CAPILLARY
GLUCOSE-CAPILLARY: 155 mg/dL — AB (ref 65–99)
GLUCOSE-CAPILLARY: 181 mg/dL — AB (ref 65–99)
Glucose-Capillary: 196 mg/dL — ABNORMAL HIGH (ref 65–99)
Glucose-Capillary: 279 mg/dL — ABNORMAL HIGH (ref 65–99)

## 2016-05-09 LAB — URINE DRUG SCREEN, QUALITATIVE (ARMC ONLY)
Amphetamines, Ur Screen: NOT DETECTED
BARBITURATES, UR SCREEN: NOT DETECTED
Benzodiazepine, Ur Scrn: POSITIVE — AB
CANNABINOID 50 NG, UR ~~LOC~~: NOT DETECTED
Cocaine Metabolite,Ur ~~LOC~~: NOT DETECTED
MDMA (ECSTASY) UR SCREEN: NOT DETECTED
METHADONE SCREEN, URINE: NOT DETECTED
Opiate, Ur Screen: NOT DETECTED
Phencyclidine (PCP) Ur S: NOT DETECTED
Tricyclic, Ur Screen: NOT DETECTED

## 2016-05-09 LAB — VALPROIC ACID LEVEL: VALPROIC ACID LVL: 51 ug/mL (ref 50.0–100.0)

## 2016-05-09 MED ORDER — POTASSIUM CHLORIDE CRYS ER 20 MEQ PO TBCR
40.0000 meq | EXTENDED_RELEASE_TABLET | Freq: Once | ORAL | Status: AC
  Administered 2016-05-09: 11:00:00 40 meq via ORAL
  Filled 2016-05-09: qty 2

## 2016-05-09 MED ORDER — INSULIN GLARGINE 100 UNIT/ML ~~LOC~~ SOLN
18.0000 [IU] | Freq: Two times a day (BID) | SUBCUTANEOUS | Status: DC
  Administered 2016-05-09 – 2016-05-10 (×3): 18 [IU] via SUBCUTANEOUS
  Filled 2016-05-09 (×4): qty 0.18

## 2016-05-09 MED ORDER — LISINOPRIL 20 MG PO TABS
40.0000 mg | ORAL_TABLET | Freq: Every day | ORAL | Status: DC
  Administered 2016-05-09: 22:00:00 40 mg via ORAL
  Filled 2016-05-09: qty 2

## 2016-05-09 NOTE — Progress Notes (Addendum)
[place] [facility] [place] NAME: Gilbert [name]    MR#:  [id number]  DATE OF BIRTH:  1941-01-20  SUBJECTIVE:  CHIEF COMPLAINT:   Chief Complaint  Patient presents with  . Fatigue   -Speech is clear. Less visual hallucinations today. Almost close to baseline. -Worked with physical therapy this morning  REVIEW OF SYSTEMS:  Review of Systems  Constitutional: Negative for chills, fever and malaise/fatigue.  HENT: Negative for ear discharge, hearing loss and nosebleeds.   Eyes: Negative for blurred vision.  Respiratory: Negative for cough, shortness of breath and wheezing.   Cardiovascular: Negative for chest pain, palpitations and leg swelling.  Gastrointestinal: Negative for abdominal pain, constipation, diarrhea, nausea and vomiting.  Genitourinary: Negative for dysuria.  Neurological: Negative for dizziness, speech change, focal weakness, seizures and headaches.  Psychiatric/Behavioral: Positive for hallucinations. Negative for depression.    DRUG ALLERGIES:  No Known Allergies  VITALS:  Blood pressure (!) 182/77, pulse 71, temperature 98.1 F (36.7 C), temperature source Oral, resp. rate 18, height 6' (1.829 m), weight 90 kg (198 lb 8 oz), SpO2 94 %.  PHYSICAL EXAMINATION:  Physical Exam  GENERAL:  76 y.o.-year-old patient Sitting in the bed, more alert,  Not in any acute distress  EYES: Pupils equal, round, reactive to light and accommodation. No scleral icterus. Extraocular muscles intact.  HEENT: Head atraumatic, normocephalic. Oropharynx and nasopharynx clear.  NECK:  Supple, no jugular venous distention. No thyroid enlargement, no tenderness.  LUNGS: Normal breath sounds bilaterally, no wheezing, rales,rhonchi or crepitation. No use of accessory muscles of respiration.  CARDIOVASCULAR: S1, S2 normal. No murmurs, rubs, or gallops.  ABDOMEN: Soft, nontender, nondistended. Bowel sounds present. No organomegaly or mass.    EXTREMITIES: No pedal edema, cyanosis, or clubbing.  NEUROLOGIC: Cranial nerves seem to be intact. His expressive aphasia has resolved. No focal motor deficits. Sensation intact. No trouble with gait, steady. PSYCHIATRIC: The patient is alert And oriented 3 SKIN: No obvious rash, lesion, or ulcer.    LABORATORY PANEL:   CBC  Recent Labs Lab 05/07/16 0958  WBC 7.8  HGB 12.9*  HCT 37.8*  PLT 168   ------------------------------------------------------------------------------------------------------------------  Chemistries   Recent Labs Lab 05/05/16 1619  05/09/16 0539  NA 138  < > 142  K 4.0  < > 3.2*  CL 106  < > 111  CO2 25  < > 26  GLUCOSE 341*  < > 148*  BUN 19  < > 21*  CREATININE 1.53*  < > 1.39*  CALCIUM 8.5*  < > 8.2*  AST 28  --   --   ALT 22  --   --   ALKPHOS 104  --   --   BILITOT <0.1*  --   --   < > = values in this interval not displayed. ------------------------------------------------------------------------------------------------------------------  Cardiac Enzymes  Recent Labs Lab 05/05/16 1619  TROPONINI <0.03   ------------------------------------------------------------------------------------------------------------------  RADIOLOGY:  Ct Head Wo Contrast  Result Date: 05/07/2016 CLINICAL DATA:  Altered mental status. Left temporal slowing on EEG. EXAM: CT HEAD WITHOUT CONTRAST TECHNIQUE: Contiguous axial images were obtained from the base of the skull through the vertex without intravenous contrast. COMPARISON:  Head CT 05/05/2016 and MRI 05/06/2016 FINDINGS: Brain: Chronic left basal ganglia lacunar infarct is again noted. There is no evidence of acute cortically based infarct, intracranial hemorrhage, mass, or midline shift. Mildly prominent extra-axial CSF spaces overlying the frontal lobes are unchanged and more fully evaluated on  yesterday's MRI. There is mild cerebral atrophy. Patchy cerebral white matter hypodensities are unchanged  and nonspecific but compatible with moderate chronic small vessel ischemic disease. Vascular: Mild calcified atherosclerosis at the skullbase. No hyperdense vessel. Skull: No fracture or focal osseous lesion. Sinuses/Orbits: Mild paranasal sinus mucosal thickening, greatest in the right sphenoid sinus with small volume bubbly secretions. Clear mastoid air cells. Prior bilateral cataract extraction. Other: None. IMPRESSION: 1. No evidence of acute intracranial abnormality. 2. Moderate chronic small vessel ischemic disease. Electronically Signed   By: Logan Bores M.D.   On: 05/07/2016 14:17    EKG:   Orders placed or performed during the hospital encounter of 05/05/16  . EKG 12-Lead  . EKG 12-Lead  . ED EKG  . ED EKG    ASSESSMENT AND PLAN:   76 year old male with past medical history significant for diabetes, hypertension presents to the hospital secondary to expressive aphasia and confusion.  #1 acute CVA-has MRI negative CVA in the left temporal region. -CT head positive for small lacunar infarct in the left basal ganglia region. Echocardiogram with normal ejection fraction but no cardiac source of emboli. -Carotid Dopplers with 60% left carotid atherosclerosis, patient has had right carotid endarterectomy done in the past with no significant stenosis now -EEG showing slowing in the left temporal region likely from stroke.Repeat EEG done did not show any acute findings and shown improvement -Appreciate neurology consult. Aspirin and Plavix and statin are recommended. -Blood pressure control. -Will need outpatient physical therapy  #2 Staring spell-  Likely underlying seizure. Keppra was started but had visual hallucinations- could be side effects from [place]. Keppra discontinued and started on Depakote. - improving visual hallucinations -repeat EEG shows improvement compared to his prior EKG..  #3 hypertension-on Norvasc, lisinopril and atenolol Lisinopril dose increased  #4 diabetes  mellitus-Sugars are elevated as he is not on his home dose of Lantus. Lantus dose being slowly adjusted. on sliding scale insulin  #5 Hypokalemia- replaced  #6 DVT prophylaxis-on Lovenox  Anticipate discharge tomorrow. family updated at bedside    All the records are reviewed and case discussed with Care Management/Social Workerr. Management plans discussed with the patient, family and they are in agreement.  CODE STATUS: Full Code  TOTAL TIME TAKING CARE OF THIS PATIENT: 33 minutes.   POSSIBLE D/C TOMORROW, DEPENDING ON CLINICAL CONDITION.   Gladstone Lighter M.D on 05/09/2016 at 12:18 PM  Between 7am to 6pm - Pager - [phone]  After 6pm go to [email or web address] - password [facility]  Office  [phone]  CC: Primary care physician; Golden Pop, MD

## 2016-05-09 NOTE — Progress Notes (Signed)
Subjective: Patient improved today.  Not agitated.  Does not appear to be having significant hallucinations.    Objective: Current vital signs: BP (!) 182/77 (BP Location: Left Arm)   Pulse 71   Temp 98.1 F (36.7 C) (Oral)   Resp 18   Ht 6' (1.829 m)   Wt 90 kg (198 lb 8 oz)   SpO2 94%   BMI 26.92 kg/m  Vital signs in last 24 hours: Temp:  [98.1 F (36.7 C)-98.6 F (37 C)] 98.1 F (36.7 C) (01/14 0454) Pulse Rate:  [64-73] 71 (01/14 1052) Resp:  [16-18] 18 (01/14 0454) BP: (126-212)/(43-79) 182/77 (01/14 1052) SpO2:  [94 %-97 %] 94 % (01/14 1052)  Intake/Output from previous day: 01/13 0701 - 01/14 0700 In: 240 [P.O.:240] Out: -  Intake/Output this shift: No intake/output data recorded. Nutritional status: Diet heart healthy/carb modified Room service appropriate? Yes; Fluid consistency: Thin  Neurologic Exam: Mental Status: Alert.  Speech fluent without evidence of aphasia.  Able to follow 3 step commands without difficulty. Cranial Nerves: II: Discs flat bilaterally; Visual fields grossly normal, pupils equal, round, reactive to light and accommodation III,IV, VI: ptosis not present, extra-ocular motions intact bilaterally V,VII: right facial droop, facial light touch sensation normal bilaterally VIII: hearing normal bilaterally IX,X: gag reflex present XI: bilateral shoulder shrug XII: midline tongue extension Motor: Moves all extremities strongly against gravity  Lab Results: Basic Metabolic Panel:  Recent Labs Lab 05/05/16 1619 05/06/16 0444 05/08/16 0700 05/09/16 0539  NA 138 143 139 142  K 4.0 3.7 3.6 3.2*  CL 106 109 107 111  CO2 25 29 24 26   GLUCOSE 341* 193* 296* 148*  BUN 19 17 19  21*  CREATININE 1.53* 1.37* 1.49* 1.39*  CALCIUM 8.5* 8.5* 8.8* 8.2*    Liver Function Tests:  Recent Labs Lab 05/05/16 1619  AST 28  ALT 22  ALKPHOS 104  BILITOT <0.1*  PROT 7.2  ALBUMIN 3.7   No results for input(s): LIPASE, AMYLASE in the last 168  hours. No results for input(s): AMMONIA in the last 168 hours.  CBC:  Recent Labs Lab 05/05/16 1619 05/06/16 0444 05/07/16 0958  WBC 6.6 6.3 7.8  NEUTROABS 3.5  --   --   HGB 12.5* 11.9* 12.9*  HCT 38.4* 35.8* 37.8*  MCV 79.9* 78.9* 78.9*  PLT 157 145* 168    Cardiac Enzymes:  Recent Labs Lab 05/05/16 1619  TROPONINI <0.03    Lipid Panel:  Recent Labs Lab 05/06/16 0444  CHOL 132  TRIG 212*  HDL 29*  CHOLHDL 4.6  VLDL 42*  LDLCALC 61    CBG:  Recent Labs Lab 05/08/16 0922 05/08/16 1214 05/08/16 1734 05/08/16 2040 05/09/16 0809  GLUCAP 277* 287* 211* 137* 155*    Microbiology: No results found for this or any previous visit.  Coagulation Studies:  Recent Labs  05/07/16 1234  LABPROT 11.7  INR 0.86    Imaging: Ct Head Wo Contrast  Result Date: 05/07/2016 CLINICAL DATA:  Altered mental status. Left temporal slowing on EEG. EXAM: CT HEAD WITHOUT CONTRAST TECHNIQUE: Contiguous axial images were obtained from the base of the skull through the vertex without intravenous contrast. COMPARISON:  Head CT 05/05/2016 and MRI 05/06/2016 FINDINGS: Brain: Chronic left basal ganglia lacunar infarct is again noted. There is no evidence of acute cortically based infarct, intracranial hemorrhage, mass, or midline shift. Mildly prominent extra-axial CSF spaces overlying the frontal lobes are unchanged and more fully evaluated on yesterday's MRI. There is mild  cerebral atrophy. Patchy cerebral white matter hypodensities are unchanged and nonspecific but compatible with moderate chronic small vessel ischemic disease. Vascular: Mild calcified atherosclerosis at the skullbase. No hyperdense vessel. Skull: No fracture or focal osseous lesion. Sinuses/Orbits: Mild paranasal sinus mucosal thickening, greatest in the right sphenoid sinus with small volume bubbly secretions. Clear mastoid air cells. Prior bilateral cataract extraction. Other: None. IMPRESSION: 1. No evidence of  acute intracranial abnormality. 2. Moderate chronic small vessel ischemic disease. Electronically Signed   By: Logan Bores M.D.   On: 05/07/2016 14:17    Medications:  I have reviewed the patient's current medications. Scheduled: . amLODipine  5 mg Oral Daily  . aspirin EC  81 mg Oral Daily  . atenolol  25 mg Oral Daily  . atorvastatin  40 mg Oral q1800  . clopidogrel  75 mg Oral Daily  . divalproex  500 mg Oral BID  . enoxaparin (LOVENOX) injection  40 mg Subcutaneous Q24H  . insulin aspart  0-5 Units Subcutaneous QHS  . insulin aspart  0-9 Units Subcutaneous TID WC  . insulin glargine  18 Units Subcutaneous BID  . lisinopril  40 mg Oral QHS    Assessment/Plan: Improved.  Off Keppra.  Depakote level 51.  Appears to be tolerating well.  Recommendations: 1.  Continue Depakote at current dose.     LOS: 4 days   Alexis Goodell, MD Neurology [phone] 05/09/2016  11:50 AM

## 2016-05-09 NOTE — Plan of Care (Signed)
Problem: Activity: Goal: Risk for activity intolerance will decrease Outcome: Progressing Pt ambulated with physical therapy today 3 times around the nurses station. Pt also took a shower.

## 2016-05-09 NOTE — Progress Notes (Signed)
Physical Therapy Treatment Patient Details Name: Gilbert FLOREA Sr. MRN: [id number] DOB: Sep 14, 1940 Today's Date: 05/09/2016    History of Present Illness 76 y/o male here with significant change in mental status.  He's having severe word finding issues, probable vision issues.  Pt is generally healthy at baseline and able to be active and independent.     PT Comments    Pt in bed, ready for session.  Out of bed with rail without assist.  He was able to ambulate around unit x 3 without voiced fatigue and no AD.  Pt occasionally reaching for handrails in hallway.  Unsteadiness noted which granddaughter stated was not his baseline.  He participated in higher level balance activities in room without UE support as described below.  Overall did very well today.  Followed instructions well.  Some impulsiveness noted but generally safe.  Would benefit from continued therapy for higher level balance skills.     Follow Up Recommendations  Home health PT;Outpatient PT;Supervision/Assistance - 24 hour     Equipment Recommendations       Recommendations for Other Services       Precautions / Restrictions Precautions Precautions: Fall Restrictions Weight Bearing Restrictions: No    Mobility  Bed Mobility Overal bed mobility: Independent                Transfers Overall transfer level: Independent Equipment used: None             General transfer comment: Somewhat impulsive  Ambulation/Gait Ambulation/Gait assistance: Min guard Ambulation Distance (Feet): 600 Feet Assistive device: None Gait Pattern/deviations: Staggering right   Gait velocity interpretation: Below normal speed for age/gender General Gait Details: Reaches for rails at times, No LOB's but some unsteadiness noted.     Stairs            Wheelchair Mobility    Modified Rankin (Stroke Patients Only)       Balance Overall balance assessment: Modified Independent                                   Cognition Arousal/Alertness: Awake/alert Behavior During Therapy: WFL for tasks assessed/performed Overall Cognitive Status: Within Functional Limits for tasks assessed                 General Comments: Followed command and communicated well.  Some impulsiveness noted.    Exercises Other Exercises Other Exercises: Standing exercises without UE support x 10 for toe raises, SLR, marches, Ab/ad, hamstring curls Other Exercises: Balance activities including sidestepping and backwards gait    General Comments        Pertinent Vitals/Pain Pain Assessment: No/denies pain    Home Living                      Prior Function            PT Goals (current goals can now be found in the care plan section)      Frequency    7X/week      PT Plan Current plan remains appropriate    Co-evaluation             End of Session Equipment Utilized During Treatment: Gait belt Activity Tolerance: Patient tolerated treatment well Patient left: in bed;with bed alarm set;with family/visitor present;with call bell/phone within reach     Time: 1015-1031 PT Time Calculation (min) (ACUTE ONLY): 16 min  Charges:  $Gait Training: 8-22 mins $Therapeutic Activity: 8-22 mins                    G Codes:      Chesley Noon 2016/05/11, 10:35 AM

## 2016-05-10 LAB — CBC
HEMATOCRIT: 35.1 % — AB (ref 40.0–52.0)
Hemoglobin: 11.9 g/dL — ABNORMAL LOW (ref 13.0–18.0)
MCH: 26.9 pg (ref 26.0–34.0)
MCHC: 33.9 g/dL (ref 32.0–36.0)
MCV: 79.4 fL — ABNORMAL LOW (ref 80.0–100.0)
PLATELETS: 145 10*3/uL — AB (ref 150–440)
RBC: 4.42 MIL/uL (ref 4.40–5.90)
RDW: 15.3 % — AB (ref 11.5–14.5)
WBC: 6.6 10*3/uL (ref 3.8–10.6)

## 2016-05-10 LAB — GLUCOSE, CAPILLARY: Glucose-Capillary: 106 mg/dL — ABNORMAL HIGH (ref 65–99)

## 2016-05-10 MED ORDER — AMLODIPINE BESYLATE 10 MG PO TABS: 10.0000 mg | ORAL_TABLET | Freq: Every day | ORAL | 2 refills | Status: DC

## 2016-05-10 MED ORDER — ATENOLOL 25 MG PO TABS: 25.0000 mg | ORAL_TABLET | Freq: Every day | ORAL | 2 refills | Status: DC

## 2016-05-10 MED ORDER — LISINOPRIL 40 MG PO TABS: 40.0000 mg | ORAL_TABLET | Freq: Every day | ORAL | 2 refills | Status: DC

## 2016-05-10 MED ORDER — ASPIRIN 81 MG PO TBEC: 81.0000 mg | DELAYED_RELEASE_TABLET | Freq: Every day | ORAL | 2 refills | Status: DC

## 2016-05-10 MED ORDER — AMLODIPINE BESYLATE 10 MG PO TABS
10.0000 mg | ORAL_TABLET | Freq: Every day | ORAL | Status: DC
  Administered 2016-05-10: 09:00:00 10 mg via ORAL
  Filled 2016-05-10: qty 1

## 2016-05-10 MED ORDER — ATORVASTATIN CALCIUM 40 MG PO TABS: 40.0000 mg | ORAL_TABLET | Freq: Every day | ORAL | 2 refills | Status: DC

## 2016-05-10 MED ORDER — CLOPIDOGREL BISULFATE 75 MG PO TABS: 75.0000 mg | ORAL_TABLET | Freq: Every day | ORAL | 2 refills | Status: DC

## 2016-05-10 MED ORDER — DIVALPROEX SODIUM 500 MG PO DR TAB: 500.0000 mg | DELAYED_RELEASE_TABLET | Freq: Two times a day (BID) | ORAL | 2 refills | Status: DC

## 2016-05-10 NOTE — Progress Notes (Signed)
Subjective: Patient continues to improve cognitively.  No further seizures noted.    Objective: Current vital signs: BP 133/64   Pulse 66   Temp 97.9 F (36.6 C) (Oral)   Resp 19   Ht 6' (1.829 m)   Wt 90 kg (198 lb 8 oz)   SpO2 92%   BMI 26.92 kg/m  Vital signs in last 24 hours: Temp:  [97.4 F (36.3 C)-97.9 F (36.6 C)] 97.9 F (36.6 C) (01/15 0556) Pulse Rate:  [55-66] 66 (01/15 0845) Resp:  [19] 19 (01/15 0556) BP: (133-199)/(58-75) 133/64 (01/15 0845) SpO2:  [92 %-93 %] 92 % (01/15 0556)  Intake/Output from previous day: 01/14 0701 - 01/15 0700 In: 480 [P.O.:480] Out: -  Intake/Output this shift: Total I/O In: 240 [P.O.:240] Out: -  Nutritional status: Diet heart healthy/carb modified Room service appropriate? Yes; Fluid consistency: Thin Diet - low sodium heart healthy  Neurologic Exam: Mental Status: Alert. Speech fluent without evidence of aphasia. Able to follow 3 step commands without difficulty. Cranial Nerves: II: Discs flat bilaterally; Visual fields grossly normal, pupils equal, round, reactive to light and accommodation III,IV, VI: ptosis not present, extra-ocular motions intact bilaterally V,VII: right facial droop, facial light touch sensation normal bilaterally VIII: hearing normal bilaterally IX,X: gag reflex present XI: bilateral shoulder shrug XII: midline tongue extension Motor: Moves all extremities strongly against gravity Cerebellar: Finger to nose and heel to shin intact  Lab Results: Basic Metabolic Panel:  Recent Labs Lab 05/05/16 1619 05/06/16 0444 05/08/16 0700 05/09/16 0539  NA 138 143 139 142  K 4.0 3.7 3.6 3.2*  CL 106 109 107 111  CO2 25 29 24 26   GLUCOSE 341* 193* 296* 148*  BUN 19 17 19  21*  CREATININE 1.53* 1.37* 1.49* 1.39*  CALCIUM 8.5* 8.5* 8.8* 8.2*    Liver Function Tests:  Recent Labs Lab 05/05/16 1619  AST 28  ALT 22  ALKPHOS 104  BILITOT <0.1*  PROT 7.2  ALBUMIN 3.7   No results for  input(s): LIPASE, AMYLASE in the last 168 hours. No results for input(s): AMMONIA in the last 168 hours.  CBC:  Recent Labs Lab 05/05/16 1619 05/06/16 0444 05/07/16 0958 05/10/16 0450  WBC 6.6 6.3 7.8 6.6  NEUTROABS 3.5  --   --   --   HGB 12.5* 11.9* 12.9* 11.9*  HCT 38.4* 35.8* 37.8* 35.1*  MCV 79.9* 78.9* 78.9* 79.4*  PLT 157 145* 168 145*    Cardiac Enzymes:  Recent Labs Lab 05/05/16 1619  TROPONINI <0.03    Lipid Panel:  Recent Labs Lab 05/06/16 0444  CHOL 132  TRIG 212*  HDL 29*  CHOLHDL 4.6  VLDL 42*  LDLCALC 61    CBG:  Recent Labs Lab 05/09/16 0809 05/09/16 1147 05/09/16 1623 05/09/16 2132 05/10/16 0727  GLUCAP 155* 196* 279* 181* 106*    Microbiology: No results found for this or any previous visit.  Coagulation Studies:  Recent Labs  05/07/16 1234  LABPROT 11.7  INR 0.86    Imaging: No results found.  Medications:  I have reviewed the patient's current medications. Scheduled: . amLODipine  10 mg Oral Daily  . aspirin EC  81 mg Oral Daily  . atenolol  25 mg Oral Daily  . atorvastatin  40 mg Oral q1800  . clopidogrel  75 mg Oral Daily  . divalproex  500 mg Oral BID  . enoxaparin (LOVENOX) injection  40 mg Subcutaneous Q24H  . insulin aspart  0-5 Units Subcutaneous  QHS  . insulin aspart  0-9 Units Subcutaneous TID WC  . insulin glargine  18 Units Subcutaneous BID  . lisinopril  40 mg Oral QHS    Assessment/Plan: Patient continues to improve cognitively.  No further seizures noted.    Recommendations: 1.  Continue Depakote at current dose 2.  Continue ASA, Plavix and statin therapy 3.  Patient to follow up with neurology as an outpatient  4.  Patient unable to drive, operate heavy machinery, perform activities at heights and participate in water activities until release by outpatient physician.   LOS: 5 days   Alexis Goodell, MD Neurology [phone] 05/10/2016  12:33 PM

## 2016-05-10 NOTE — Evaluation (Signed)
Occupational Therapy Evaluation Patient Details Name: GREER KOEPPEN Sr. MRN: [id number] DOB: [birth year] Today's Date: 05/10/2016    History of Present Illness Pt. is a 76 y.o. male who was admitted to [facility] with AMS, and slurred speech. Pt. PMHx includes: DM, HTN, fatigue, and confusion.   Clinical Impression   Pt. Is a 76 y.o. Male who was admitted to [facility] with AMS, and slurred speech. Pt. Reports being close to his baseline functioning. Pt. BUE functioning is WFL, and pt. Is engaging them in AD tasks. No further oT services are warranted at this time. No further OT services are warranted at this level of care. All OT needs can be addressed at the next venue of care.    Follow Up Recommendations  HHOT    Equipment Recommendations       Recommendations for Other Services       Precautions / Restrictions Precautions Precautions: Fall Restrictions Weight Bearing Restrictions: No      Mobility                   Transfers: Pt. Up in room, standing at the sink, talking on phone upon arrival. Pt. Required Supervision to walk back to the chair. No LOB noted.                      Balance                                            ADL   Eating/Feeding: Set up   Grooming: Set up               Lower Body Dressing: Independent               Functional mobility during ADLs: Supervision/safety       Vision     Perception     Praxis      Pertinent Vitals/Pain Pain Assessment: No/denies pain     Hand Dominance Right   Extremity/Trunk Assessment Upper Extremity Assessment Upper Extremity Assessment: Overall WFL for tasks assessed (Grip strength: Right 75#, Left 70#.)           Communication     Cognition Arousal/Alertness: Awake/alert Behavior During Therapy: WFL for tasks assessed/performed Overall Cognitive Status: Within Functional Limits for tasks assessed                     General  Comments       Exercises       Shoulder Instructions      Home Living Family/patient expects to be discharged to:: Private residence Living Arrangements: Spouse/significant other Available Help at Discharge: Family Type of Home: House Home Access: Stairs to enter Technical brewer of Steps: 5   Home Layout: Two level     Bathroom Shower/Tub: Tub/shower unit                    Prior Functioning/Environment Level of Independence: Independent        Comments: Independent and active.        OT Problem List:     OT Treatment/Interventions:      OT Goals(Current goals can be found in the care plan section) Acute Rehab OT Goals Patient Stated Goal: To retrun home OT Goal Formulation: With patient Potential to Achieve Goals: Good  OT Frequency:  Barriers to D/C:            Co-evaluation              End of Session    Activity Tolerance: Patient tolerated treatment well Patient left: in chair;with call bell/phone within reach   Time: 9470-9628 OT Time Calculation (min): 15 min Charges:  OT General Charges $OT Visit: 1 Procedure OT Evaluation $OT Eval Low Complexity: 1 Procedure G-Codes:    Harrel Carina, MS, OTR/L 05/10/2016, 10:35 AM

## 2016-05-10 NOTE — Discharge Summary (Signed)
[facility] NAME: Gilbert [name]    MR#:  [id number]  DATE OF BIRTH:  1940/09/27  DATE OF ADMISSION:  05/05/2016   ADMITTING PHYSICIAN: Loletha Grayer, MD  DATE OF DISCHARGE: 05/10/2016 11:40 AM  PRIMARY CARE PHYSICIAN: Golden Pop, MD   ADMISSION DIAGNOSIS:   CVA (cerebral vascular accident) ([place]) [I63.9]  DISCHARGE DIAGNOSIS:   Active Problems:   CVA (cerebral vascular accident) ([place])   Expressive aphasia   Seizures ([place])   SECONDARY DIAGNOSIS:   Past Medical History:  Diagnosis Date  . Diabetes mellitus without complication ([place])   . Hypertension     HOSPITAL COURSE:   76 year old male with past medical history significant for diabetes, hypertension presents to the hospital secondary to expressive aphasia and confusion.  #1 acute CVA-has MRI negative CVA in the left temporal region. -CT head positive for small lacunar infarct in the left basal ganglia region. Echocardiogram with normal ejection fraction but no cardiac source of emboli. -Carotid Dopplers with 60% left carotid atherosclerosis, patient has had right carotid endarterectomy done in the past with no significant stenosis now -EEG showing slowing in the left temporal region likely from stroke.Repeat EEG done did not show any acute findings and shown improvement -Appreciate neurology consult. Aspirin and Plavix and statin are recommended. -Blood pressure control. -Will need outpatient physical therapy  #2 Staring spell-  underlying seizure. Keppra was started but had visual hallucinations- could be side effects from [place]. Keppra discontinued With improvement in mental status and started on Depakote. Tolerating Depakote well. - mental status back to normal -repeat EEG shows improvement compared to his prior EKG..  #3 hypertension-on Norvasc, lisinopril and atenolol Lisinopril dose increased  #4 diabetes mellitus- sugars are better controlled  once Lantus dose has been adjusted.  #5 Hypokalemia- replaced  Worked with physical therapy and they have recommended outpatient services. Discharge home today.   DISCHARGE CONDITIONS:   Stable  CONSULTS OBTAINED:   Treatment Team:  Alexis Goodell, MD  DRUG ALLERGIES:   No Known Allergies DISCHARGE MEDICATIONS:   Allergies as of 05/10/2016   No Known Allergies     Medication List    STOP taking these medications   indomethacin 50 MG capsule Commonly known as:  INDOCIN   simvastatin 80 MG tablet Commonly known as:  ZOCOR     TAKE these medications   amLODipine 10 MG tablet Commonly known as:  NORVASC Take 1 tablet (10 mg total) by mouth daily.   aspirin 81 MG EC tablet Take 1 tablet (81 mg total) by mouth daily.   atenolol 25 MG tablet Commonly known as:  TENORMIN Take 1 tablet (25 mg total) by mouth daily. What changed:  additional instructions   atorvastatin 40 MG tablet Commonly known as:  LIPITOR Take 1 tablet (40 mg total) by mouth daily at 6 PM.   clopidogrel 75 MG tablet Commonly known as:  PLAVIX Take 1 tablet (75 mg total) by mouth daily.   divalproex 500 MG DR tablet Commonly known as:  DEPAKOTE Take 1 tablet (500 mg total) by mouth 2 (two) times daily.   insulin aspart protamine- aspart (70-30) 100 UNIT/ML injection Commonly known as:  NOVOLOG MIX 70/30 Inject 30 Units into the skin 2 (two) times daily with a meal. 30 units subq morning and evening   lisinopril 40 MG tablet Commonly known as:  PRINIVIL,ZESTRIL Take 1 tablet (40 mg total) by mouth at bedtime.  DISCHARGE INSTRUCTIONS:   1. PCP f/u in 1 week 2. Neurology f/u in 3-4 weeks 3. No driving or operating heavy machinery until cleared by outpatient neurology  DIET:   Cardiac diet  ACTIVITY:   Activity as tolerated  OXYGEN:   Home Oxygen: No.  Oxygen Delivery: room air  DISCHARGE LOCATION:   home   If you experience worsening of your admission  symptoms, develop shortness of breath, life threatening emergency, suicidal or homicidal thoughts you must seek medical attention immediately by calling 911 or calling your MD immediately  if symptoms less severe.  You Must read complete instructions/literature along with all the possible adverse reactions/side effects for all the Medicines you take and that have been prescribed to you. Take any new Medicines after you have completely understood and accpet all the possible adverse reactions/side effects.   Please note  You were cared for by a hospitalist during your hospital stay. If you have any questions about your discharge medications or the care you received while you were in the hospital after you are discharged, you can call the unit and asked to speak with the hospitalist on call if the hospitalist that took care of you is not available. Once you are discharged, your primary care physician will handle any further medical issues. Please note that NO REFILLS for any discharge medications will be authorized once you are discharged, as it is imperative that you return to your primary care physician (or establish a relationship with a primary care physician if you do not have one) for your aftercare needs so that they can reassess your need for medications and monitor your lab values.    On the day of Discharge:  VITAL SIGNS:   Blood pressure 133/64, pulse 66, temperature 97.9 F (36.6 C), temperature source Oral, resp. rate 19, height 6' (1.829 m), weight 90 kg (198 lb 8 oz), SpO2 92 %.  PHYSICAL EXAMINATION:    GENERAL:  76 y.o.-year-old patient Sitting in the bed, more alert,  Not in any acute distress  EYES: Pupils equal, round, reactive to light and accommodation. No scleral icterus. Extraocular muscles intact.  HEENT: Head atraumatic, normocephalic. Oropharynx and nasopharynx clear.  NECK:  Supple, no jugular venous distention. No thyroid enlargement, no tenderness.  LUNGS: Normal  breath sounds bilaterally, no wheezing, rales,rhonchi or crepitation. No use of accessory muscles of respiration.  CARDIOVASCULAR: S1, S2 normal. No murmurs, rubs, or gallops.  ABDOMEN: Soft, nontender, nondistended. Bowel sounds present. No organomegaly or mass.  EXTREMITIES: No pedal edema, cyanosis, or clubbing.  NEUROLOGIC: Cranial nerves seem to be intact. His expressive aphasia has resolved. No focal motor deficits. Sensation intact. No trouble with gait, steady. PSYCHIATRIC: The patient is alert And oriented 3 SKIN: No obvious rash, lesion, or ulcer.   DATA REVIEW:   CBC  Recent Labs Lab 05/10/16 0450  WBC 6.6  HGB 11.9*  HCT 35.1*  PLT 145*    Chemistries   Recent Labs Lab 05/05/16 1619  05/09/16 0539  NA 138  < > 142  K 4.0  < > 3.2*  CL 106  < > 111  CO2 25  < > 26  GLUCOSE 341*  < > 148*  BUN 19  < > 21*  CREATININE 1.53*  < > 1.39*  CALCIUM 8.5*  < > 8.2*  AST 28  --   --   ALT 22  --   --   ALKPHOS 104  --   --   BILITOT <  0.1*  --   --   < > = values in this interval not displayed.   Microbiology Results  No results found for this or any previous visit.  RADIOLOGY:  No results found.   Management plans discussed with the patient, family and they are in agreement.  CODE STATUS:  Code Status History    Date Active Date Inactive Code Status Order ID Comments User Context   05/05/2016  7:32 PM 05/10/2016  2:40 PM Full Code [id number]  Loletha Grayer, MD ED    Advance Directive Documentation   [place] Most Recent Value  Type of Advance Directive  Living will  Pre-existing out of facility DNR order (yellow form or pink MOST form)  No data  "MOST" Form in Place?  No data      TOTAL TIME TAKING CARE OF THIS PATIENT: 37 minutes.    Gladstone Lighter M.D on 05/10/2016 at 5:24 PM  Between 7am to 6pm - Pager - [phone]  After 6pm go to [email or web address] - Proofreader  [facility]  Office   [phone]  CC: Primary care physician; Golden Pop, MD   Note: This dictation was prepared with Dragon dictation along with smaller phrase technology. Any transcriptional errors that result from this process are unintentional.

## 2016-05-10 NOTE — Evaluation (Signed)
Speech Language Pathology Evaluation Patient Details Name: Gilbert DANIELLO Sr. MRN: [id number] DOB: April 05, 1941 Today's Date: 05/10/2016 Time: 6659-9357 SLP Time Calculation (min) (ACUTE ONLY): 42 min  Problem List:  Patient Active Problem List   Diagnosis Date Noted  . Seizures ([place])   . Expressive aphasia   . CVA (cerebral vascular accident) ([place]) 05/05/2016   Past Medical History:  Past Medical History:  Diagnosis Date  . Diabetes mellitus without complication ([place])   . Hypertension    Past Surgical History:  Past Surgical History:  Procedure Laterality Date  . NO PAST SURGERIES     HPI:  Pt is an 76 y.o. male with a history of DM and HTN who on yesterday was found by wife when she got home to be confused and unable to communicate appropriately. Patient was ambulatory and no focal weakness was noted. Family brought patient to ED for evaluation. Pt is generally healthy at baseline and able to be active and independent at home/community. During initial presentation, pt exhibited semantic/phonemic paraphasias and word finding issues - unable to comprehend pt's responses during confrontational questions; noted intermittent, automatic speech responses/verbalizations. Unsure if Receptive language deficits or if Expressive deficits are impacting follow through. Pt also exhibited Right vision deficits.    Assessment / Plan / Recommendation Clinical Impression  76 year old man presenting to the ED 05/05/2016 with slurred speech and "unable to get his words out as he would like".  The patient was seen for a clinical/bedside swallow evaluation 05/06/2016 and was ("Pt also exhibited moderate-severe expressive language deficits suspect receptive deficits which need to be r/o) which impacted communication interactions w/ pt during instruction and encouragement to eat/drink at meal.") The patient was assessed this AM using the screening for of the Western Aphasia Battery- Revised and scored an  Aphasia Quotient of 100/100 (no aphasia).  The patient is able to read a paragraph and demonstrate comprehension without difficulty.  The patient's speech/language deficits have resolved in the interval since 05/06/2016.  The patient demonstrates some mild cognitive impairment including reduced attention, distractibility, and reduced short term memory.    SLP Assessment  Patient does not need any further Speech Lanaguage Pathology Services    Follow Up Recommendations       Frequency and Duration           SLP Evaluation Cognition  Overall Cognitive Status: No family/caregiver present to determine baseline cognitive functioning Orientation Level: Oriented X4       Comprehension  Auditory Comprehension Overall Auditory Comprehension: Appears within functional limits for tasks assessed Reading Comprehension Reading Status: Within funtional limits    Expression Expression Primary Mode of Expression: Verbal Verbal Expression Overall Verbal Expression: Appears within functional limits for tasks assessed   Oral / Motor  Oral Motor/Sensory Function Overall Oral Motor/Sensory Function: Within functional limits Motor Speech Overall Motor Speech: Appears within functional limits for tasks assessed   GO                   Gilbert Sea, MS/CCC- SLP  Valetta Fuller, Susie 05/10/2016, 10:03 AM

## 2016-05-10 NOTE — Care Management Important Message (Signed)
Important Message  Patient Details  Name: Gilbert GALENTINE Sr. MRN: [id number] Date of Birth: 05-25-1940   Medicare Important Message Given:  Yes    Shelbie Ammons, RN 05/10/2016, 9:55 AM

## 2016-10-19 ENCOUNTER — Other Ambulatory Visit: Payer: Self-pay

## 2016-11-11 ENCOUNTER — Ambulatory Visit (INDEPENDENT_AMBULATORY_CARE_PROVIDER_SITE_OTHER): Payer: Medicare Other | Admitting: Urology

## 2016-11-11 ENCOUNTER — Encounter: Payer: Self-pay | Admitting: Urology

## 2016-11-11 VITALS — BP 113/54 | HR 54 | Ht 72.0 in | Wt 198.6 lb

## 2016-11-11 DIAGNOSIS — N400 Enlarged prostate without lower urinary tract symptoms: Secondary | ICD-10-CM | POA: Diagnosis not present

## 2016-11-11 LAB — MICROSCOPIC EXAMINATION
Bacteria, UA: NONE SEEN
RBC MICROSCOPIC, UA: NONE SEEN /HPF (ref 0–?)

## 2016-11-11 LAB — URINALYSIS, COMPLETE
Bilirubin, UA: NEGATIVE
GLUCOSE, UA: NEGATIVE
Ketones, UA: NEGATIVE
NITRITE UA: NEGATIVE
RBC, UA: NEGATIVE
Specific Gravity, UA: >1.03 — ABNORMAL HIGH (ref 1.005–1.030)
Urobilinogen, Ur: 0.2 mg/dL (ref 0.2–1.0)
pH, UA: 5.5 (ref 5.0–7.5)

## 2016-11-11 NOTE — Progress Notes (Signed)
11/11/2016 2:24 PM   Gilbert Finner Sr. January 26, 1941 [id number]  Referring provider: Guadalupe Maple, MD [address] [place], [place] [address]  Chief Complaint  Patient presents with  . Elevated PSA    HPI: The patient is a 76 year old gentleman with past medical history recent stroke on plavix who presents today to discuss his PSA.  His PSA was 7.71 and June 2018 and 6.84 in February 2018. Patient has no family history of prostate cancer. He has never seen urologist before. He voids with a good stream. He has minimal nocturia. He feels he empties his bladder. No history of hematuria, UTI, nephrolithiasis.    PMH: Past Medical History:  Diagnosis Date  . Diabetes mellitus without complication ([place])   . Hypertension     Surgical History: Past Surgical History:  Procedure Laterality Date  . NO PAST SURGERIES      Home Medications:  Allergies as of 11/11/2016   No Known Allergies     Medication List       Accurate as of 11/11/16  2:24 PM. Always use your most recent med list.          amLODipine 10 MG tablet Commonly known as:  NORVASC Take 1 tablet (10 mg total) by mouth daily.   aspirin 81 MG EC tablet Take 1 tablet (81 mg total) by mouth daily.   atenolol 25 MG tablet Commonly known as:  TENORMIN Take 1 tablet (25 mg total) by mouth daily.   atorvastatin 40 MG tablet Commonly known as:  LIPITOR Take 1 tablet (40 mg total) by mouth daily at 6 PM.   clopidogrel 75 MG tablet Commonly known as:  PLAVIX Take 1 tablet (75 mg total) by mouth daily.   divalproex 500 MG DR tablet Commonly known as:  DEPAKOTE Take 1 tablet (500 mg total) by mouth 2 (two) times daily.   insulin aspart protamine- aspart (70-30) 100 UNIT/ML injection Commonly known as:  NOVOLOG MIX 70/30 Inject 30 Units into the skin 2 (two) times daily with a meal. 30 units subq morning and evening   levothyroxine 75 MCG tablet Commonly known as:  SYNTHROID, LEVOTHROID Take 75 mcg by  mouth daily before breakfast.   lisinopril 40 MG tablet Commonly known as:  PRINIVIL,ZESTRIL Take 1 tablet (40 mg total) by mouth at bedtime.       Allergies: No Known Allergies  Family History: Family History  Problem Relation Age of Onset  . Brain cancer Mother   . Prostate cancer Neg Hx   . Bladder Cancer Neg Hx   . Kidney cancer Neg Hx     Social History:  reports that he has never smoked. He has never used smokeless tobacco. He reports that he does not drink alcohol or use drugs.  ROS: UROLOGY Frequent Urination?: Yes Hard to postpone urination?: No Burning/pain with urination?: No Get up at night to urinate?: Yes Leakage of urine?: No Urine stream starts and stops?: No Trouble starting stream?: No Do you have to strain to urinate?: No Blood in urine?: No Urinary tract infection?: No Sexually transmitted disease?: No Injury to kidneys or bladder?: No Painful intercourse?: No Weak stream?: No Erection problems?: Yes Penile pain?: No  Gastrointestinal Nausea?: No Vomiting?: No Indigestion/heartburn?: No Diarrhea?: No Constipation?: No  Constitutional Fever: No Night sweats?: No Weight loss?: No Fatigue?: Yes  Skin Skin rash/lesions?: No Itching?: No  Eyes Blurred vision?: No Double vision?: No  Ears/Nose/Throat Sore throat?: No Sinus problems?: No  Hematologic/Lymphatic Swollen glands?: No Easy bruising?: Yes  Cardiovascular Leg swelling?: Yes Chest pain?: No  Respiratory Cough?: No Shortness of breath?: No  Endocrine Excessive thirst?: No  Musculoskeletal Back pain?: Yes Joint pain?: No  Neurological Headaches?: No Dizziness?: No  Psychologic Depression?: No Anxiety?: No  Physical Exam: BP (!) 113/54 (BP Location: Left Arm, Patient Position: Sitting, Cuff Size: Normal)   Pulse (!) 54   Ht 6' (1.829 m)   Wt 198 lb 9.6 oz (90.1 kg)   BMI 26.94 kg/m   Constitutional:  Alert and oriented, No acute distress. HEENT: [place]  AT, moist mucus membranes.  Trachea midline, no masses. Cardiovascular: No clubbing, cyanosis, or edema. Respiratory: Normal respiratory effort, no increased work of breathing. GI: Abdomen is soft, nontender, nondistended, no abdominal masses GU: No CVA tenderness. Normal phallus. Testicles descended bilaterally without masses. DRE: Smooth 2+ benign. Skin: No rashes, bruises or suspicious lesions. Lymph: No cervical or inguinal adenopathy. Neurologic: Grossly intact, no focal deficits, moving all 4 extremities. Psychiatric: Normal mood and affect.  Laboratory Data: Lab Results  Component Value Date   WBC 6.6 05/10/2016   HGB 11.9 (L) 05/10/2016   HCT 35.1 (L) 05/10/2016   MCV 79.4 (L) 05/10/2016   PLT 145 (L) 05/10/2016    Lab Results  Component Value Date   CREATININE 1.39 (H) 05/09/2016    No results found for: PSA  No results found for: TESTOSTERONE  Lab Results  Component Value Date   HGBA1C 8.5 (H) 05/06/2016    Urinalysis    Component Value Date/Time   COLORURINE STRAW (A) 05/05/2016 1901   APPEARANCEUR CLEAR (A) 05/05/2016 1901   LABSPEC 1.002 (L) 05/05/2016 1901   PHURINE 6.0 05/05/2016 1901   GLUCOSEU >=500 (A) 05/05/2016 1901   HGBUR NEGATIVE 05/05/2016 1901   BILIRUBINUR NEGATIVE 05/05/2016 1901   KETONESUR NEGATIVE 05/05/2016 1901   PROTEINUR NEGATIVE 05/05/2016 1901   NITRITE NEGATIVE 05/05/2016 1901   LEUKOCYTESUR NEGATIVE 05/05/2016 1901    Assessment & Plan:    1. BPH I had a long discussion with the patient and his wife regarding the role of PSA screening to detect prostate cancer. We did discuss the American Urological Association guidelines which discourage checking PSA after the age of 57. We also discussed that if it is in fact checked after 70 that the threshold of normal should be raised to between 10 and 20. I did discuss with the patient that for his age, his PSA is considered completely normal.  We also discussed autopsy studies that show  that 80% of men who died of something other than prostate cancer had indolent prostate cancer. Based on these guidelines, I do not think the patient needs any further follow-up of this PSA. I recommend against further screening for prostate cancer via PSA checks.  Return if symptoms worsen or fail to improve.  Nickie Retort, MD  [facility] [address] [place], [place] [address] [phone]

## 2017-04-27 DIAGNOSIS — R4701 Aphasia: Secondary | ICD-10-CM | POA: Diagnosis not present

## 2017-04-27 DIAGNOSIS — G40909 Epilepsy, unspecified, not intractable, without status epilepticus: Secondary | ICD-10-CM | POA: Diagnosis not present

## 2017-04-27 DIAGNOSIS — G20 Parkinson's disease: Secondary | ICD-10-CM | POA: Diagnosis not present

## 2017-04-27 DIAGNOSIS — I6381 Other cerebral infarction due to occlusion or stenosis of small artery: Secondary | ICD-10-CM | POA: Diagnosis not present

## 2017-05-26 ENCOUNTER — Encounter: Payer: Self-pay | Admitting: Occupational Therapy

## 2017-05-26 ENCOUNTER — Ambulatory Visit: Payer: Non-veteran care | Attending: Neurology | Admitting: Occupational Therapy

## 2017-05-26 ENCOUNTER — Other Ambulatory Visit: Payer: Self-pay

## 2017-05-26 DIAGNOSIS — R262 Difficulty in walking, not elsewhere classified: Secondary | ICD-10-CM | POA: Diagnosis present

## 2017-05-26 DIAGNOSIS — R278 Other lack of coordination: Secondary | ICD-10-CM | POA: Diagnosis present

## 2017-05-26 DIAGNOSIS — M6281 Muscle weakness (generalized): Secondary | ICD-10-CM

## 2017-05-26 DIAGNOSIS — R2681 Unsteadiness on feet: Secondary | ICD-10-CM | POA: Diagnosis present

## 2017-05-26 NOTE — Therapy (Addendum)
[place] MAIN [facility] SERVICES [address] [place], [place], [address] Phone: [phone]   Fax:  [phone]  Occupational Therapy Evaluation  Patient Details  Name: Gilbert STANGL Sr. MRN: [id number] Date of Birth: [birth year] Referring Provider: Joselyn Arrow   Encounter Date: 05/26/2017  OT End of Session - 05/26/17 2110    Visit Number  1    Number of Visits  17    Date for OT Re-Evaluation  07/01/17    OT Start Time  1300    OT Stop Time  1400    OT Time Calculation (min)  60 min    Activity Tolerance  Patient tolerated treatment well    Behavior During Therapy  [facility] for tasks assessed/performed       Past Medical History:  Diagnosis Date  . Diabetes mellitus without complication ([place])   . Hypertension     Past Surgical History:  Procedure Laterality Date  . NO PAST SURGERIES      There were no vitals filed for this visit.  Subjective Assessment - 05/26/17 1313    Subjective   Patient reports he had a stroke a year ago and shortly thereafter he was diagnosed with Parkinson's disease.     Patient Stated Goals  "I want to be normal"    Currently in Pain?  Yes    Pain Score  1     Pain Location  Hip    Pain Orientation  Left    Pain Descriptors / Indicators  Aching    Pain Radiating Towards  has more pain in the hip at night    Pain Onset  More than a month ago    Pain Frequency  Intermittent    Multiple Pain Sites  No        [facility] OT Assessment - 05/26/17 1316      Assessment   Medical Diagnosis  Parkinson's disease    Referring Provider  Manuella Ghazi, H    Prior Therapy  none      Balance Screen   Has the patient fallen in the past 6 months  No    Has the patient had a decrease in activity level because of a fear of falling?   No    Is the patient reluctant to leave their home because of a fear of falling?   No      Home  Environment   Family/patient expects to be discharged to:  Private residence    Living  Arrangements  Spouse/significant other    Available Help at Discharge  Family    Type of [place]  Two level    Alternate Level Stairs - Number of Steps  4 to enter    Bathroom Shower/Tub  Tub/Shower unit;Curtain    Armed forces logistics/support/administrative officer bars - tub/shower    Lives With  Spouse      Prior Function   Level of Independence  Independent    Vocation  Retired    Biomedical scientist  retired from [employer]    Leisure  Scientist, clinical (histocompatibility and immunogenetics), fishing      ADL   Eating/Feeding  Independent    Grooming  Independent    Forensic scientist  Increased time    Psychologist, clinical  Increased time;Modified independent    Optometrist  Modified independent    ADL comments  Patient reports he requires increased time to complete self care tasks now, he has difficulty with handwriting with formation of smaller letters and decreased legibility.  He has difficulty at times getting up from lower surfaces.  Memory decline especially with recalling how to get to places when driving.  He reports he sometimes has to pull off the road to think and then he is able to figure out his way.  He has tremors at times in bilateral UEs left worse than right.       IADL   Shopping  Shops independently for small purchases    Light Housekeeping  Performs light daily tasks such as dishwashing, bed making    Meal Prep  Able to complete simple warm meal prep    Community Mobility  Drives own vehicle    Medication Management  Takes responsibility if medication is prepared in advance in seperate dosage    Financial Management  Requires assistance      Mobility   Mobility Status  Independent    Mobility Status Comments  patient reports he has more difficulty getting up and down       Written  Expression   Dominant Hand  Right    Handwriting  Mild micrographia;25% legible      Vision - History   Baseline Vision  Wears glasses only for reading    Additional Comments  cataracts removed in the past.      Sensation   Light Touch  Appears Intact      Coordination   9 Hole Peg Test  Right;Left      ROM / Strength   AROM / PROM / Strength  AROM;Strength      AROM   Overall AROM   Within functional limits for tasks performed      Strength   Overall Strength  Deficits    Overall Strength Comments  4+/5 overall UE/LE      Hand Function   Right Hand Grip (lbs)  65    Right Hand Lateral Pinch  20 lbs    Right Hand 3 Point Pinch  18 lbs    Left Hand Grip (lbs)  55    Left Hand Lateral Pinch  14 lbs    Left 3 point pinch  12 lbs         6 minute walk test 995 feet 5 times sit to stand 12 secs.  Berg Balance Test [id number]             OT Education - 05/26/17 2109    Education provided  Yes    Education Details  POC, goals, role of OT     Person(s) Educated  Patient;Spouse    Methods  Explanation;Demonstration;Tactile cues    Comprehension  Verbalized understanding;Returned demonstration;Verbal cues required         OT Long Term Goals - 05/26/17      OT LONG TERM GOAL #1   Title  Patient will complete HEP for maximal daily exercises with modified independence in 4 weeks     Baseline  no current program     Time  4    Period  Weeks    Status  New    Target Date  07/01/17      OT LONG TERM GOAL #2  Title  Patient will improve gait speed and endurance and be able to walk 1150 feet in 6 minutes to negotiate around the home and community safely in 4 weeks.     Time  4    Period  Weeks    Status  New    Target Date  07/01/17      OT LONG TERM GOAL #3   Title   Patient will perform sit to stand from lower and unstable surfaces with modified independence.      Baseline  difficulty with transitioning from sit to stand especially from lower surfaces.      Time  4    Period  Weeks    Status  New    Target Date  07/02/17      OT LONG TERM GOAL #4   Title  Patient will demonstrate performance of basic self care tasks with modified independence efficiently.    Baseline  increased time to complete tasks, occasional difficulty with putting pants on.     Time  4    Period  Weeks    Status  New    Target Date  07/01/17      OT LONG TERM GOAL #5   Title  Patient will complete handwriting with name and address with 75% legibility.    Baseline  25% legibility at eval     Time  4    Period  Weeks    Status  New    Target Date  07/01/17          Clinical impression statement: Patient is an 77 yo male diagnosed with Parkinson's disease about a year ago and was referred by his physician for LSVT BIG program. Patient presents with difficulty walking, muscle weakness UE/LE, decreased reciprocal arm swing, decreased functional balance, history of falls, mild hesitations with functional mobility, difficulty with transitional movements, and decreased coordination which affect his ability to perform daily tasks. The patient is judged to be an excellent candidate for the LSVT BIG program. He would benefit from and was referred for the LSVT BIG program which is an intensive program designed specifically for Parkinson's patients with a focus on increasing amplitude and speed of movements, improving self-care and daily tasks and providing patients with daily exercises to improve overall function. It is recommended that the patient receive the LSVT BIG program which is comprised of 16 intensive sessions (4 times per week for 4 weeks, one hour sessions). Prognosis for improvement is good based on his motivation and strong family support. LSVT BIG has been documented in the literature as efficacious for individuals with Parkinson's disease.     Plan - 05/26/17 2111    Occupational performance deficits (Please refer to evaluation for details):   ADL's;IADL's;Social Participation;Leisure    Rehab Potential  Good    OT Frequency  4x / week    OT Duration  4 weeks    OT Treatment/Interventions  Self-care/ADL training;Patient/family education;Gait Training;Balance training;Stair Training;Functional Furniture conservator/restorer;Therapeutic exercise;Manual Therapy;Therapeutic activities;Neuromuscular education    Clinical Decision Making  Limited treatment options, no task modification necessary    Consulted and Agree with Plan of Care  Patient;Family member/caregiver    Family Member Consulted  wife       Patient will benefit from skilled therapeutic intervention in order to improve the following deficits and impairments:  Abnormal gait, Decreased endurance, Decreased strength, Decreased balance, Difficulty walking, Decreased mobility, Decreased cognition, Decreased coordination  Visit Diagnosis: Difficulty in walking, not elsewhere classified  Other lack of coordination  Muscle weakness (generalized)  Unsteadiness on feet    Problem List Patient Active Problem List   Diagnosis Date Noted  . Seizures ([place])   . Expressive aphasia   . CVA (cerebral vascular accident) [facility]) 05/05/2016   Amy T Tomasita Morrow, OTR/L, CLT  Lovett,Amy 05/26/2017, 9:18 PM  [place] MAIN [facility] SERVICES [address] [place], [place], [address] Phone: [phone]   Fax:  [phone]  Name: Gilbert ARGANBRIGHT Sr. MRN: [id number] Date of Birth: 09-03-1940

## 2017-05-30 ENCOUNTER — Ambulatory Visit: Payer: Non-veteran care | Attending: Neurology | Admitting: Occupational Therapy

## 2017-05-30 DIAGNOSIS — R262 Difficulty in walking, not elsewhere classified: Secondary | ICD-10-CM | POA: Insufficient documentation

## 2017-05-30 DIAGNOSIS — R2681 Unsteadiness on feet: Secondary | ICD-10-CM | POA: Diagnosis present

## 2017-05-30 DIAGNOSIS — M6281 Muscle weakness (generalized): Secondary | ICD-10-CM | POA: Diagnosis present

## 2017-05-30 DIAGNOSIS — R278 Other lack of coordination: Secondary | ICD-10-CM | POA: Diagnosis present

## 2017-05-31 ENCOUNTER — Ambulatory Visit: Payer: Non-veteran care | Admitting: Occupational Therapy

## 2017-05-31 DIAGNOSIS — R278 Other lack of coordination: Secondary | ICD-10-CM

## 2017-05-31 DIAGNOSIS — M6281 Muscle weakness (generalized): Secondary | ICD-10-CM

## 2017-05-31 DIAGNOSIS — R262 Difficulty in walking, not elsewhere classified: Secondary | ICD-10-CM

## 2017-05-31 DIAGNOSIS — R2681 Unsteadiness on feet: Secondary | ICD-10-CM

## 2017-06-01 ENCOUNTER — Ambulatory Visit: Payer: Non-veteran care | Admitting: Occupational Therapy

## 2017-06-01 ENCOUNTER — Encounter: Payer: Self-pay | Admitting: Occupational Therapy

## 2017-06-01 DIAGNOSIS — R262 Difficulty in walking, not elsewhere classified: Secondary | ICD-10-CM | POA: Diagnosis not present

## 2017-06-01 DIAGNOSIS — M6281 Muscle weakness (generalized): Secondary | ICD-10-CM

## 2017-06-01 DIAGNOSIS — R2681 Unsteadiness on feet: Secondary | ICD-10-CM

## 2017-06-01 DIAGNOSIS — R278 Other lack of coordination: Secondary | ICD-10-CM

## 2017-06-01 NOTE — Addendum Note (Signed)
Addended by: Garlon Hatchet T on: 06/01/2017 02:55 PM   Modules accepted: Orders

## 2017-06-01 NOTE — Therapy (Signed)
[place] MAIN [facility] SERVICES [address] [place], [place], [address] Phone: [phone]   Fax:  [phone]  Occupational Therapy Treatment  Patient Details  Name: Gilbert [name]. MRN: [id number] Date of Birth: 1940-07-31 Referring Provider: Joselyn Arrow   Encounter Date: 05/30/2017  OT End of Session - 06/01/17 1501    Visit Number  2    Number of Visits  17    Date for OT Re-Evaluation  07/01/17    OT Start Time  1000    OT Stop Time  1058    OT Time Calculation (min)  58 min    Activity Tolerance  Patient tolerated treatment well    Behavior During Therapy  [facility] for tasks assessed/performed       Past Medical History:  Diagnosis Date  . Diabetes mellitus without complication ([place])   . Hypertension     Past Surgical History:  Procedure Laterality Date  . NO PAST SURGERIES      There were no vitals filed for this visit.  Subjective Assessment - 06/01/17 1456    Subjective   Patient reports he is ready to get started with therapy and wants to do well.     Pertinent History  Patient was diagnosed with Parkinson's disease about 1 year ago, he also had a CVA in Jan 2018.  He reports since his stroke he has issues at times with memory, often gets in the car to go somewhere he is familiar with and has to pull off, think about the route he needs to take and then is able to get there.  He reports tremors in bilateral UEs with left worse than right.  He reports recent issues with transfers, walking and mild issues with self care tasks.     Patient Stated Goals  "I want to be normal"    Currently in Pain?  No/denies    Pain Score  0-No pain                   OT Treatments/Exercises (OP) - 06/01/17 1459      Neurological Re-education Exercises   Other Exercises 1  Patient seen for initial instruction of LSVT BIG exercises: LSVT Daily Session Maximal Daily Exercises: Sustained movements are designed to rescale the amplitude  of movement output for generalization to daily functional activities. Performed as follows for 1 set of 10 repetitions each: Multi directional sustained movements- 1) Floor to ceiling, 2) Side to side. Multi directional Repetitive movements performed in standing and are designed to provide retraining effort needed for sustained muscle activation in tasks Performed as follows: 3) Step and reach forward, 4) Step and Reach Backwards, 5) Step and reach sideways, 6) Rock and reach forward/backward, 7) Rock and reach sideways. Requires min to CGA for all exercises in standing.  Sit to stand from mat table on lowest setting with cues for weight shift, technique and CGA for 10 reps for 1 set.   Patient performing reciprocal stepping exercise with CGA and cues for 10 reps each foot, stair negotiation 4 steps for 5 reps each, cues for big turns, CGA.             OT Education - 06/01/17 1501    Education provided  Yes    Education Details  LSVT BIG maximal daily exercises.    Person(s) Educated  Patient    Methods  Explanation;Demonstration;Verbal cues    Comprehension  Verbal cues required;Returned demonstration;Verbalized understanding  OT Long Term Goals - 06/01/17 1438      OT LONG TERM GOAL #1   Title  Patient will complete HEP for maximal daily exercises with modified independence in 4 weeks     Baseline  no current program     Time  4    Period  Weeks    Status  New    Target Date  07/01/17      OT LONG TERM GOAL #2   Title  Patient will improve gait speed and endurance and be able to walk 875 feet in 6 minutes to negotiate around the home and community safely in 4 weeks.     Time  4    Period  Weeks    Status  New    Target Date  07/01/17      OT LONG TERM GOAL #3   Title   Patient will perform sit to stand from lower and unstable surfaces with modified independence.      Baseline  difficulty with transitioning from sit to stand especially from lower surfaces.     Time   4    Period  Weeks    Status  New    Target Date  07/02/17      OT LONG TERM GOAL #4   Title  Patient will demonstrate performance of basic self care tasks with modified independence efficiently.    Baseline  increased time to complete tasks, occasional difficulty with putting pants on.     Time  4    Period  Weeks    Status  New    Target Date  07/01/17      OT LONG TERM GOAL #5   Title  Patient will complete handwriting with name and address with 75% legibility.    Baseline  25% legibility at eval     Time  4    Period  Weeks    Status  New    Target Date  07/01/17            Plan - 06/01/17 1525    Clinical Impression Statement  Patient seen for initial instruction on LSVT BIG maximal daily exercises this date.  Patient requires min to CGA for exercises in standing and therapist demonstration, guidance to complete.  Patient will require continued instruction and training to become proficient with skills.  Will also plan to establish functional component tasks this week.    Occupational Profile and client history currently impacting functional performance  Parkinson's disease, tremors in B hands, decreased memory, history of CVA    Occupational performance deficits (Please refer to evaluation for details):  ADL's;IADL's;Social Participation;Leisure    Rehab Potential  Good    Current Impairments/barriers affecting progress:  memory deficits, past CVA    OT Frequency  4x / week    OT Duration  4 weeks    OT Treatment/Interventions  Self-care/ADL training;Patient/family education;Gait Training;Balance training;Stair Training;Functional Furniture conservator/restorer;Therapeutic exercise;Manual Therapy;Therapeutic activities;Neuromuscular education    Consulted and Agree with Plan of Care  Patient       Patient will benefit from skilled therapeutic intervention in order to improve the following deficits and impairments:  Abnormal gait, Decreased endurance, Decreased strength, Decreased  balance, Difficulty walking, Decreased mobility, Decreased cognition, Decreased coordination  Visit Diagnosis: Difficulty in walking, not elsewhere classified  Other lack of coordination  Muscle weakness (generalized)  Unsteadiness on feet    Problem List Patient Active Problem List   Diagnosis Date Noted  . Seizures (  [place])   . Expressive aphasia   . CVA (cerebral vascular accident) [facility]) 05/05/2016   Beonka Amesquita T Tomasita Morrow, OTR/L, CLT  Emiah Pellicano 06/01/2017, 3:28 PM  [place] MAIN [facility] SERVICES [address] [place], [place], [address] Phone: [phone]   Fax:  [phone]  Name: Gilbert [name] [name]. MRN: [id number] Date of Birth: Aug 10, 1940

## 2017-06-01 NOTE — Therapy (Signed)
[place] MAIN [facility] SERVICES [address] [place], [place], [address] Phone: [phone]   Fax:  [phone]  Occupational Therapy Treatment  Patient Details  Name: Gilbert CECH Sr. MRN: [id number] Date of Birth: 1940-11-28 Referring Provider: Joselyn Arrow   Encounter Date: 05/31/2017  OT End of Session - 06/01/17 1600    Visit Number  3    Number of Visits  17    Date for OT Re-Evaluation  07/01/17    OT Start Time  1000    OT Stop Time  1100    OT Time Calculation (min)  60 min    Activity Tolerance  Patient tolerated treatment well    Behavior During Therapy  [facility] for tasks assessed/performed       Past Medical History:  Diagnosis Date  . Diabetes mellitus without complication ([place])   . Hypertension     Past Surgical History:  Procedure Laterality Date  . NO PAST SURGERIES      There were no vitals filed for this visit.  Subjective Assessment - 06/01/17 1548    Subjective   Patient reports he is not sore from yesterdays session with start of exercises, no complaints.  Patient asking when he will be given handouts to work on some exercises at home.     Pertinent History  Patient was diagnosed with Parkinson's disease about 1 year ago, he also had a CVA in Jan 2018.  He reports since his stroke he has issues at times with memory, often gets in the car to go somewhere he is familiar with and has to pull off, think about the route he needs to take and then is able to get there.  He reports tremors in bilateral UEs with left worse than right.  He reports recent issues with transfers, walking and mild issues with self care tasks.     Patient Stated Goals  "I want to be normal"    Currently in Pain?  No/denies    Pain Score  0-No pain                   OT Treatments/Exercises (OP) - 06/01/17 1557      Neurological Re-education Exercises   Other Exercises 1  Patient seen for instruction of LSVT BIG exercises: LSVT Daily  Session Maximal Daily Exercises: Sustained movements are designed to rescale the amplitude of movement output for generalization to daily functional activities. Performed as follows for 1 set of 10 repetitions each: Multi directional sustained movements- 1) Floor to ceiling, 2) Side to side. Multi directional Repetitive movements performed in standing and are designed to provide retraining effort needed for sustained muscle activation in tasks Performed as follows: 3) Step and reach forward, 4) Step and Reach Backwards, 5) Step and reach sideways, 6) Rock and reach forward/backward, 7) Rock and reach sideways. Requires min to CGA for all exercises in standing.  Sit to stand from mat table on lowest setting with cues for weight shift, technique and CGA for 10 reps for 1 set.   Patient performing reciprocal stepping exercise with CGA and cues for 10 reps each foot, stair negotiation 4 steps for 5 reps each, cues for big turns, CGA.  Patient issued and instructed on LSVT maximal daily exercises in standard version for use at home.     Other Exercises 2  Functional mobility for 2 trials of 500 feet outdoors on uneven ground, pavement, winding pathways, sloped areas. SBA and cues for  amplitude of gait and reciprocal arm swing.              OT Education - 06/01/17 1600    Education provided  Yes    Education Details  LSVT BIG exercises    Person(s) Educated  Patient    Methods  Explanation;Demonstration;Verbal cues;Handout    Comprehension  Verbal cues required;Returned demonstration;Verbalized understanding          OT Long Term Goals - 06/01/17 1438      OT LONG TERM GOAL #1   Title  Patient will complete HEP for maximal daily exercises with modified independence in 4 weeks     Baseline  no current program     Time  4    Period  Weeks    Status  New    Target Date  07/01/17      OT LONG TERM GOAL #2   Title  Patient will improve gait speed and endurance and be able to walk 875 feet in 6  minutes to negotiate around the home and community safely in 4 weeks.     Time  4    Period  Weeks    Status  New    Target Date  07/01/17      OT LONG TERM GOAL #3   Title   Patient will perform sit to stand from lower and unstable surfaces with modified independence.      Baseline  difficulty with transitioning from sit to stand especially from lower surfaces.     Time  4    Period  Weeks    Status  New    Target Date  07/02/17      OT LONG TERM GOAL #4   Title  Patient will demonstrate performance of basic self care tasks with modified independence efficiently.    Baseline  increased time to complete tasks, occasional difficulty with putting pants on.     Time  4    Period  Weeks    Status  New    Target Date  07/01/17      OT LONG TERM GOAL #5   Title  Patient will complete handwriting with name and address with 75% legibility.    Baseline  25% legibility at eval     Time  4    Period  Weeks    Status  New    Target Date  07/01/17            Plan - 06/01/17 1600    Clinical Impression Statement  Patient able to recall a couple of exercises from last session.  He is progressing well with instruction and issued HEP this date for maximal daily exercises to perform for another set of 10 repetition for each exercise at home.  Patient requires cues and therapist demo for exercises along with CGA in standing for balance.  One loss of balance this date with self recovery. Continue to work towards goals.     Occupational Profile and client history currently impacting functional performance  Parkinson's disease, tremors in B hands, decreased memory, history of CVA    Occupational performance deficits (Please refer to evaluation for details):  ADL's;IADL's;Social Participation;Leisure    Rehab Potential  Good    Current Impairments/barriers affecting progress:  memory deficits, past CVA    OT Frequency  4x / week    OT Duration  4 weeks    OT Treatment/Interventions   Self-care/ADL training;Patient/family education;Gait Training;Balance training;Stair Training;Functional Furniture conservator/restorer;Therapeutic exercise;Manual  Therapy;Therapeutic activities;Neuromuscular education    Consulted and Agree with Plan of Care  Patient       Patient will benefit from skilled therapeutic intervention in order to improve the following deficits and impairments:  Abnormal gait, Decreased endurance, Decreased strength, Decreased balance, Difficulty walking, Decreased mobility, Decreased cognition, Decreased coordination  Visit Diagnosis: Difficulty in walking, not elsewhere classified  Other lack of coordination  Muscle weakness (generalized)  Unsteadiness on feet    Problem List Patient Active Problem List   Diagnosis Date Noted  . Seizures ([place])   . Expressive aphasia   . CVA (cerebral vascular accident) [facility]) 05/05/2016   Amy T Tomasita Morrow, OTR/L, CLT  Lovett,Amy 06/01/2017, 4:03 PM  [place] MAIN [facility] SERVICES [address] [place], [place], [address] Phone: [phone]   Fax:  [phone]  Name: Gilbert ROTTENBERG Sr. MRN: [id number] Date of Birth: 05/23/1940

## 2017-06-02 ENCOUNTER — Encounter: Payer: Self-pay | Admitting: Occupational Therapy

## 2017-06-02 ENCOUNTER — Ambulatory Visit: Payer: Non-veteran care | Admitting: Occupational Therapy

## 2017-06-02 DIAGNOSIS — R262 Difficulty in walking, not elsewhere classified: Secondary | ICD-10-CM

## 2017-06-02 DIAGNOSIS — R278 Other lack of coordination: Secondary | ICD-10-CM

## 2017-06-02 DIAGNOSIS — R2681 Unsteadiness on feet: Secondary | ICD-10-CM

## 2017-06-02 DIAGNOSIS — M6281 Muscle weakness (generalized): Secondary | ICD-10-CM

## 2017-06-06 ENCOUNTER — Ambulatory Visit: Payer: Non-veteran care | Admitting: Occupational Therapy

## 2017-06-06 DIAGNOSIS — M6281 Muscle weakness (generalized): Secondary | ICD-10-CM

## 2017-06-06 DIAGNOSIS — R2681 Unsteadiness on feet: Secondary | ICD-10-CM

## 2017-06-06 DIAGNOSIS — R262 Difficulty in walking, not elsewhere classified: Secondary | ICD-10-CM

## 2017-06-06 DIAGNOSIS — R278 Other lack of coordination: Secondary | ICD-10-CM

## 2017-06-07 ENCOUNTER — Ambulatory Visit: Payer: Non-veteran care | Admitting: Occupational Therapy

## 2017-06-07 DIAGNOSIS — R2681 Unsteadiness on feet: Secondary | ICD-10-CM

## 2017-06-07 DIAGNOSIS — M6281 Muscle weakness (generalized): Secondary | ICD-10-CM

## 2017-06-07 DIAGNOSIS — R262 Difficulty in walking, not elsewhere classified: Secondary | ICD-10-CM | POA: Diagnosis not present

## 2017-06-07 DIAGNOSIS — R278 Other lack of coordination: Secondary | ICD-10-CM

## 2017-06-08 ENCOUNTER — Ambulatory Visit: Payer: Non-veteran care | Admitting: Occupational Therapy

## 2017-06-08 DIAGNOSIS — R2681 Unsteadiness on feet: Secondary | ICD-10-CM

## 2017-06-08 DIAGNOSIS — R262 Difficulty in walking, not elsewhere classified: Secondary | ICD-10-CM | POA: Diagnosis not present

## 2017-06-08 DIAGNOSIS — M6281 Muscle weakness (generalized): Secondary | ICD-10-CM

## 2017-06-08 DIAGNOSIS — R278 Other lack of coordination: Secondary | ICD-10-CM

## 2017-06-09 ENCOUNTER — Ambulatory Visit: Payer: Non-veteran care | Admitting: Occupational Therapy

## 2017-06-09 DIAGNOSIS — R2681 Unsteadiness on feet: Secondary | ICD-10-CM

## 2017-06-09 DIAGNOSIS — R262 Difficulty in walking, not elsewhere classified: Secondary | ICD-10-CM | POA: Diagnosis not present

## 2017-06-09 DIAGNOSIS — R278 Other lack of coordination: Secondary | ICD-10-CM

## 2017-06-09 DIAGNOSIS — M6281 Muscle weakness (generalized): Secondary | ICD-10-CM

## 2017-06-11 ENCOUNTER — Encounter: Payer: Self-pay | Admitting: Occupational Therapy

## 2017-06-11 NOTE — Therapy (Signed)
[place] MAIN [facility] SERVICES [address] [place], [place], [address] Phone: [phone]   Fax:  [phone]  Occupational Therapy Treatment  Patient Details  Name: Gilbert [name] Sr. MRN: [id number] Date of Birth: [birth year] Referring Provider: Joselyn Arrow   Encounter Date: 06/08/2017  OT End of Session - 06/11/17 0858    Visit Number  9    Number of Visits  17    Date for OT Re-Evaluation  07/01/17    OT Start Time  1001    OT Stop Time  1100    OT Time Calculation (min)  59 min    Activity Tolerance  Patient tolerated treatment well    Behavior During Therapy  [facility] for tasks assessed/performed       Past Medical History:  Diagnosis Date  . Diabetes mellitus without complication ([place])   . Hypertension     Past Surgical History:  Procedure Laterality Date  . NO PAST SURGERIES      There were no vitals filed for this visit.  Subjective Assessment - 06/11/17 0856    Subjective   Patient reports he is feeling more comfortable with the exercises at home, has been performing another set in the evenings as directed.     Pertinent History  Patient was diagnosed with Parkinson's disease about 1 year ago, he also had a CVA in Jan 2018.  He reports since his stroke he has issues at times with memory, often gets in the car to go somewhere he is familiar with and has to pull off, think about the route he needs to take and then is able to get there.  He reports tremors in bilateral UEs with left worse than right.  He reports recent issues with transfers, walking and mild issues with self care tasks.     Patient Stated Goals  "I want to be normal"    Currently in Pain?  No/denies    Pain Score  0-No pain                   OT Treatments/Exercises (OP) - 06/11/17 0859      ADLs   ADL Comments  Patient seen for functional component tasks with focus on sit to stand without use of arms from mat table, chair and bench, occasional cues  for technique from lower surface.  Reaching to feet for sock donning, managing buttons and use of hand flicks, handwriting with emphasis on letter size and formation as well as memory to recall grandkids and great grandkids names., reciprocal arm and leg patterns.       Neurological Re-education Exercises   Other Exercises 1  Patient seen for instruction of LSVT BIG exercises: LSVT Daily Session Maximal Daily Exercises: Sustained movements are designed to rescale the amplitude of movement output for generalization to daily functional activities. Performed as follows for 1 set of 10 repetitions each: Multi directional sustained movements- 1) Floor to ceiling, 2) Side to side. Multi directional Repetitive movements performed in standing and are designed to provide retraining effort needed for sustained muscle activation in tasks Performed as follows: 3) Step and reach forward, 4) Step and Reach Backwards, 5) Step and reach sideways, 6) Rock and reach forward/backward, 7) Rock and reach sideways. Requires CGA for all exercises in standing.  Cues for arm positioning during exercises.    Other Exercises 2  Functional mobility skills for 1 trial of 600 feet, indoors on flat surfaces, cues for turning behaviors  and for amplitude of gait.  Cues for reciprocal arm swing.  Balance activities in standing with use of balance pad, tossing balls into container with CGA for balance.  Patient also seen for balance and body mechanics with picking balls up off the floor to place into basket.              OT Education - 06/11/17 0857    Education provided  Yes    Education Details  LSVT BIG maximal daily exercises, functional component tasks, amplitude of gait.    Person(s) Educated  Patient    Methods  Explanation;Demonstration;Verbal cues    Comprehension  Verbal cues required;Returned demonstration;Verbalized understanding          OT Long Term Goals - 06/11/17 0757      OT LONG TERM GOAL #1   Title   Patient will complete HEP for maximal daily exercises with modified independence in 4 weeks     Baseline  no current program     Time  4    Period  Weeks    Status  On-going      OT LONG TERM GOAL #2   Title  Patient will improve gait speed and endurance and be able to walk 875 feet in 6 minutes to negotiate around the home and community safely in 4 weeks.     Time  4    Period  Weeks    Status  On-going      OT LONG TERM GOAL #3   Title   Patient will perform sit to stand from lower and unstable surfaces with modified independence.      Baseline  difficulty with transitioning from sit to stand especially from lower surfaces.     Time  4    Period  Weeks    Status  On-going      OT LONG TERM GOAL #4   Title  Patient will demonstrate performance of basic self care tasks with modified independence efficiently.    Baseline  increased time to complete tasks, occasional difficulty with putting pants on.     Time  4    Period  Weeks    Status  On-going      OT LONG TERM GOAL #5   Title  Patient will complete handwriting with name and address with 75% legibility.    Baseline  25% legibility at eval     Time  4    Period  Weeks    Status  On-going            Plan - 06/11/17 0102    Clinical Impression Statement  Patient is becoming more consistent with performance of exercises, still requires CGA to SBA for exercises in standing as well as balance tasks. Patient continues to require cues for amplitude of steps especially on the right, tends to drag foot at times.  Continue to work towards increasing distractions to see if we can integrate principles of LSVT BIG consistently.  Patient is pleased with his progress.     Occupational Profile and client history currently impacting functional performance  Parkinson's disease, tremors in B hands, decreased memory, history of CVA    Occupational performance deficits (Please refer to evaluation for details):  ADL's;IADL's;Social  Participation;Leisure    Rehab Potential  Good    Current Impairments/barriers affecting progress:  memory deficits, past CVA    OT Frequency  4x / week    OT Duration  4 weeks    OT  Treatment/Interventions  Self-care/ADL training;Patient/family education;Gait Training;Balance training;Stair Buyer, retail;Therapeutic exercise;Manual Therapy;Therapeutic activities;Neuromuscular education    Consulted and Agree with Plan of Care  Patient       Patient will benefit from skilled therapeutic intervention in order to improve the following deficits and impairments:  Abnormal gait, Decreased endurance, Decreased strength, Decreased balance, Difficulty walking, Decreased mobility, Decreased cognition, Decreased coordination  Visit Diagnosis: Difficulty in walking, not elsewhere classified  Other lack of coordination  Muscle weakness (generalized)  Unsteadiness on feet    Problem List Patient Active Problem List   Diagnosis Date Noted  . Seizures ([place])   . Expressive aphasia   . CVA (cerebral vascular accident) [facility]) 05/05/2016   Marili Vader T Tomasita Morrow, OTR/L, CLT  Emani Taussig 06/11/2017, 9:06 AM  [place] MAIN [facility] [address] [place], [place], [address] Phone: [phone]   Fax:  [phone]  Name: DEVAUGHN SAVANT Sr. MRN: [id number] Date of Birth: [birth year]

## 2017-06-11 NOTE — Therapy (Signed)
[place] MAIN [facility] SERVICES [address] [place], [place], [address] Phone: [phone]   Fax:  [phone]  Occupational Therapy Treatment  Patient Details  Name: Gilbert VICK Sr. MRN: [id number] Date of Birth: Aug 10, 1940 Referring Provider: Joselyn Arrow   Encounter Date: 06/06/2017  OT End of Session - 06/11/17 0756    Visit Number  7    Number of Visits  17    Date for OT Re-Evaluation  07/01/17    OT Start Time  1000    OT Stop Time  1056    OT Time Calculation (min)  56 min    Activity Tolerance  Patient tolerated treatment well    Behavior During Therapy  [facility] for tasks assessed/performed       Past Medical History:  Diagnosis Date  . Diabetes mellitus without complication ([place])   . Hypertension     Past Surgical History:  Procedure Laterality Date  . NO PAST SURGERIES      There were no vitals filed for this visit.  Subjective Assessment - 06/11/17 0755    Subjective   Patient reports he worked on doing exercises at home over the weekend, two times a day.  "I still don't think I have them right"    Pertinent History  Patient was diagnosed with Parkinson's disease about 1 year ago, he also had a CVA in Jan 2018.  He reports since his stroke he has issues at times with memory, often gets in the car to go somewhere he is familiar with and has to pull off, think about the route he needs to take and then is able to get there.  He reports tremors in bilateral UEs with left worse than right.  He reports recent issues with transfers, walking and mild issues with self care tasks.     Patient Stated Goals  "I want to be normal"    Currently in Pain?  No/denies    Pain Score  0-No pain                   OT Treatments/Exercises (OP) - 06/11/17 0759      ADLs   ADL Comments  Functional component tasks as follows:  1)  sit to stand from chair without the use of arms, 5 repetitions and cues for technique, 2)  crossing legs  to reach to put on socks, 3) managing buttons with implementation of hand flicks prior to buttons, 4)  handwriting with larger lined paper, 5) reciprocal arm and stepping patterns.      Neurological Re-education Exercises   Other Exercises 1  Patient seen for instruction of LSVT BIG exercises: LSVT Daily Session Maximal Daily Exercises: Sustained movements are designed to rescale the amplitude of movement output for generalization to daily functional activities. Performed as follows for 1 set of 10 repetitions each: Multi directional sustained movements- 1) Floor to ceiling, 2) Side to side. Multi directional Repetitive movements performed in standing and are designed to provide retraining effort needed for sustained muscle activation in tasks Performed as follows: 3) Step and reach forward, 4) Step and Reach Backwards, 5) Step and reach sideways, 6) Rock and reach forward/backward, 7) Rock and reach sideways. Requires CGA for all exercises in standing.  Cues for arm positioning during exercises.    Other Exercises 2  Functional mobility skills for 1 trial of 600 feet, indoors on flat surfaces, cues for turning behaviors and for amplitude of gait.  Cues for  reciprocal arm swing.  Balance activities in standing with use of BOSU ball with round side turned up, CGA for balance and performed with use of parallel bars.   Increased difficulty with balance with round side up.              OT Education - 06/11/17 0756    Education provided  Yes    Education Details  LSVT BIG exercises, amplitude of gait, balance    Person(s) Educated  Patient    Methods  Explanation;Demonstration;Verbal cues    Comprehension  Verbal cues required;Returned demonstration;Verbalized understanding          OT Long Term Goals - 06/11/17 0757      OT LONG TERM GOAL #1   Title  Patient will complete HEP for maximal daily exercises with modified independence in 4 weeks     Baseline  no current program     Time  4     Period  Weeks    Status  On-going      OT LONG TERM GOAL #2   Title  Patient will improve gait speed and endurance and be able to walk 875 feet in 6 minutes to negotiate around the home and community safely in 4 weeks.     Time  4    Period  Weeks    Status  On-going      OT LONG TERM GOAL #3   Title   Patient will perform sit to stand from lower and unstable surfaces with modified independence.      Baseline  difficulty with transitioning from sit to stand especially from lower surfaces.     Time  4    Period  Weeks    Status  On-going      OT LONG TERM GOAL #4   Title  Patient will demonstrate performance of basic self care tasks with modified independence efficiently.    Baseline  increased time to complete tasks, occasional difficulty with putting pants on.     Time  4    Period  Weeks    Status  On-going      OT LONG TERM GOAL #5   Title  Patient will complete handwriting with name and address with 75% legibility.    Baseline  25% legibility at eval     Time  4    Period  Weeks    Status  On-going            Plan - 06/11/17 0757    Clinical Impression Statement  Patient has increased difficulty with balance with exercises on BOSU ball with the round side faced up, has to rely  on use of parallel bars for balance along with therapist CGA and cues for weight shifting.  Patient continues to progress with daily exercises.  Continues to require cues for arm positions and movements during exercises.     Occupational Profile and client history currently impacting functional performance  Parkinson's disease, tremors in B hands, decreased memory, history of CVA    Occupational performance deficits (Please refer to evaluation for details):  ADL's;IADL's;Social Participation;Leisure    Rehab Potential  Good    Current Impairments/barriers affecting progress:  memory deficits, past CVA    OT Frequency  4x / week    OT Duration  4 weeks    OT Treatment/Interventions   Self-care/ADL training;Patient/family education;Gait Training;Balance training;Stair Training;Functional Furniture conservator/restorer;Therapeutic exercise;Manual Therapy;Therapeutic activities;Neuromuscular education    Consulted and Agree with Plan of Care  Patient       Patient will benefit from skilled therapeutic intervention in order to improve the following deficits and impairments:  Abnormal gait, Decreased endurance, Decreased strength, Decreased balance, Difficulty walking, Decreased mobility, Decreased cognition, Decreased coordination  Visit Diagnosis: Difficulty in walking, not elsewhere classified  Other lack of coordination  Muscle weakness (generalized)  Unsteadiness on feet    Problem List Patient Active Problem List   Diagnosis Date Noted  . Seizures ([place])   . Expressive aphasia   . CVA (cerebral vascular accident) [facility]) 05/05/2016   Daviona Herbert T Tomasita Morrow, OTR/L, CLT  Giavanna Kang 06/11/2017, 8:03 AM  [place] MAIN [facility] SERVICES [address] [place], [place], [address] Phone: [phone]   Fax:  [phone]  Name: Gilbert WYSS Sr. MRN: [id number] Date of Birth: 01-23-1941

## 2017-06-11 NOTE — Therapy (Signed)
[place] MAIN [facility] SERVICES [address] [place], [place], [address] Phone: [phone]   Fax:  [phone]  Occupational Therapy Treatment  Patient Details  Name: Gilbert CONCANNON Sr. MRN: [id number] Date of Birth: June 20, 1940 Referring Provider: Joselyn Arrow   Encounter Date: 06/01/2017  OT End of Session - 06/11/17 0738    Visit Number  5    Number of Visits  17    Date for OT Re-Evaluation  07/01/17    OT Start Time  0959    OT Stop Time  1100    OT Time Calculation (min)  61 min    Activity Tolerance  Patient tolerated treatment well    Behavior During Therapy  [facility] for tasks assessed/performed       Past Medical History:  Diagnosis Date  . Diabetes mellitus without complication ([place])   . Hypertension     Past Surgical History:  Procedure Laterality Date  . NO PAST SURGERIES      There were no vitals filed for this visit.  Subjective Assessment - 06/10/17 0731    Subjective   Patient reports his handwriting is terrible, he has lots of grandkids and would like to be able to write checks to send for birthdays.     Pertinent History  Patient was diagnosed with Parkinson's disease about 1 year ago, he also had a CVA in Jan 2018.  He reports since his stroke he has issues at times with memory, often gets in the car to go somewhere he is familiar with and has to pull off, think about the route he needs to take and then is able to get there.  He reports tremors in bilateral UEs with left worse than right.  He reports recent issues with transfers, walking and mild issues with self care tasks.     Patient Stated Goals  "I want to be normal"    Currently in Pain?  No/denies    Pain Score  0-No pain                   OT Treatments/Exercises (OP) - 06/10/17 0732      ADLs   ADL Comments  Patient seen for functional component tasks as follows:  1)  sit to stand from chair without the use of arms, 5 repetitions and cues for  technique, 2)  crossing legs to reach to put on socks, 3) managing buttons with implementation of hand flicks prior to buttons, 4)  handwriting with larger lined paper, 5) reciprocal arm and stepping patterns.       Neurological Re-education Exercises   Other Exercises 1  Patient seen for instruction of LSVT BIG exercises: LSVT Daily Session Maximal Daily Exercises: Sustained movements are designed to rescale the amplitude of movement output for generalization to daily functional activities. Performed as follows for 1 set of 10 repetitions each: Multi directional sustained movements- 1) Floor to ceiling, 2) Side to side. Multi directional Repetitive movements performed in standing and are designed to provide retraining effort needed for sustained muscle activation in tasks Performed as follows: 3) Step and reach forward, 4) Step and Reach Backwards, 5) Step and reach sideways, 6) Rock and reach forward/backward, 7) Rock and reach sideways. Requires min to CGA for all exercises in standing.  Sit to stand from mat table on lowest setting with cues for weight shift, technique and CGA for 10 reps for 1 set.   Patient performing reciprocal stepping exercise with CGA  and cues for 10 reps each foot, stair negotiation 4 steps for 5 reps each, cues for big turns, CGA.     Other Exercises 2  Functional mobility for 2 trials of 500 feet outdoors on uneven ground, pavement, winding pathways, sloped areas. SBA and cues for amplitude of gait and reciprocal arm swing.              OT Education - 06/11/17 0737    Education provided  Yes    Education Details  functional component tasks, LSVT BIG exercises.    Person(s) Educated  Patient    Methods  Explanation;Demonstration;Verbal cues    Comprehension  Verbal cues required;Returned demonstration;Verbalized understanding          OT Long Term Goals - 06/01/17 1438      OT LONG TERM GOAL #1   Title  Patient will complete HEP for maximal daily exercises  with modified independence in 4 weeks     Baseline  no current program     Time  4    Period  Weeks    Status  New    Target Date  07/01/17      OT LONG TERM GOAL #2   Title  Patient will improve gait speed and endurance and be able to walk 875 feet in 6 minutes to negotiate around the home and community safely in 4 weeks.     Time  4    Period  Weeks    Status  New    Target Date  07/01/17      OT LONG TERM GOAL #3   Title   Patient will perform sit to stand from lower and unstable surfaces with modified independence.      Baseline  difficulty with transitioning from sit to stand especially from lower surfaces.     Time  4    Period  Weeks    Status  New    Target Date  07/02/17      OT LONG TERM GOAL #4   Title  Patient will demonstrate performance of basic self care tasks with modified independence efficiently.    Baseline  increased time to complete tasks, occasional difficulty with putting pants on.     Time  4    Period  Weeks    Status  New    Target Date  07/01/17      OT LONG TERM GOAL #5   Title  Patient will complete handwriting with name and address with 75% legibility.    Baseline  25% legibility at eval     Time  4    Period  Weeks    Status  New    Target Date  07/01/17            Plan - 06/11/17 1950    Clinical Impression Statement  Patient continues to report he has difficulty with remembering exercises and needs to use handouts to help, still not feeling he is doing them correctly.  Patient continues to require CGA for exercises in standing and cues for arm placement.  Cues for amplitude of gait especially when distracted and reverts back to shuffling steps.  Established functional component tasks this date.  Continue to work towards goals.     Occupational Profile and client history currently impacting functional performance  Parkinson's disease, tremors in B hands, decreased memory, history of CVA    Occupational performance deficits (Please refer  to evaluation for details):  ADL's;IADL's;Social Participation;Leisure  Rehab Potential  Good    Current Impairments/barriers affecting progress:  memory deficits, past CVA    OT Frequency  4x / week    OT Duration  4 weeks    OT Treatment/Interventions  Self-care/ADL training;Patient/family education;Gait Training;Balance training;Stair Training;Functional Furniture conservator/restorer;Therapeutic exercise;Manual Therapy;Therapeutic activities;Neuromuscular education    Consulted and Agree with Plan of Care  Patient       Patient will benefit from skilled therapeutic intervention in order to improve the following deficits and impairments:  Abnormal gait, Decreased endurance, Decreased strength, Decreased balance, Difficulty walking, Decreased mobility, Decreased cognition, Decreased coordination  Visit Diagnosis: Difficulty in walking, not elsewhere classified  Other lack of coordination  Muscle weakness (generalized)  Unsteadiness on feet    Problem List Patient Active Problem List   Diagnosis Date Noted  . Seizures ([place])   . Expressive aphasia   . CVA (cerebral vascular accident) [facility]) 05/05/2016   Persephonie Hegwood T Tomasita Morrow, OTR/L, CLT  Tyrika Newman 06/11/2017, 7:41 AM  [place] MAIN [facility] SERVICES [address] [place], [place], [address] Phone: [phone]   Fax:  [phone]  Name: Gilbert ARWOOD Sr. MRN: [id number] Date of Birth: Aug 05, 1940

## 2017-06-11 NOTE — Therapy (Signed)
[place] MAIN [facility] SERVICES [address] [place], [place], [address] Phone: [phone]   Fax:  [phone]  Occupational Therapy Treatment  Patient Details  Name: Gilbert FRERKING Sr. MRN: [id number] Date of Birth: 11/21/1940 Referring Provider: Joselyn Arrow   Encounter Date: 06/07/2017  OT End of Session - 06/11/17 0848    Visit Number  8    Number of Visits  17    Date for OT Re-Evaluation  07/01/17    OT Start Time  1000    OT Stop Time  1100    OT Time Calculation (min)  60 min    Activity Tolerance  Patient tolerated treatment well    Behavior During Therapy  [facility] for tasks assessed/performed       Past Medical History:  Diagnosis Date  . Diabetes mellitus without complication ([place])   . Hypertension     Past Surgical History:  Procedure Laterality Date  . NO PAST SURGERIES      There were no vitals filed for this visit.  Subjective Assessment - 06/11/17 0847    Subjective   Patient reports he is doing well, "I couldn't do some of this stuff just over a week ago!"    Pertinent History  Patient was diagnosed with Parkinson's disease about 1 year ago, he also had a CVA in Jan 2018.  He reports since his stroke he has issues at times with memory, often gets in the car to go somewhere he is familiar with and has to pull off, think about the route he needs to take and then is able to get there.  He reports tremors in bilateral UEs with left worse than right.  He reports recent issues with transfers, walking and mild issues with self care tasks.     Patient Stated Goals  "I want to be normal"    Currently in Pain?  No/denies    Pain Score  0-No pain                   OT Treatments/Exercises (OP) - 06/11/17 0849      ADLs   ADL Comments  Patient seen for functional component tasks with focus on sit to stand without use of arms from mat table, chair and bench, occasional cues for technique from lower surface.  Reaching to  feet for sock donning, managing buttons and use of hand flicks, handwriting with emphasis on letter size and formation as well as memory to recall grandkids and great grandkids names., reciprocal arm and leg patterns.       Neurological Re-education Exercises   Other Exercises 1  Patient seen for instruction of LSVT BIG exercises: LSVT Daily Session Maximal Daily Exercises: Sustained movements are designed to rescale the amplitude of movement output for generalization to daily functional activities. Performed as follows for 1 set of 10 repetitions each: Multi directional sustained movements- 1) Floor to ceiling, 2) Side to side. Multi directional Repetitive movements performed in standing and are designed to provide retraining effort needed for sustained muscle activation in tasks Performed as follows: 3) Step and reach forward, 4) Step and Reach Backwards, 5) Step and reach sideways, 6) Rock and reach forward/backward, 7) Rock and reach sideways. Requires CGA for all exercises in standing.  Cues for arm positioning during exercises.    Other Exercises 2  Functional mobility for 2 trials of 550 feet with cues for reciprocal arm swing and gait with heel to toe strike.  OT Education - 06/11/17 0847    Education provided  Yes    Education Details  HEP    Person(s) Educated  Patient    Methods  Explanation;Demonstration;Verbal cues    Comprehension  Verbal cues required;Returned demonstration;Verbalized understanding          OT Long Term Goals - 06/11/17 0757      OT LONG TERM GOAL #1   Title  Patient will complete HEP for maximal daily exercises with modified independence in 4 weeks     Baseline  no current program     Time  4    Period  Weeks    Status  On-going      OT LONG TERM GOAL #2   Title  Patient will improve gait speed and endurance and be able to walk 875 feet in 6 minutes to negotiate around the home and community safely in 4 weeks.     Time  4    Period   Weeks    Status  On-going      OT LONG TERM GOAL #3   Title   Patient will perform sit to stand from lower and unstable surfaces with modified independence.      Baseline  difficulty with transitioning from sit to stand especially from lower surfaces.     Time  4    Period  Weeks    Status  On-going      OT LONG TERM GOAL #4   Title  Patient will demonstrate performance of basic self care tasks with modified independence efficiently.    Baseline  increased time to complete tasks, occasional difficulty with putting pants on.     Time  4    Period  Weeks    Status  On-going      OT LONG TERM GOAL #5   Title  Patient will complete handwriting with name and address with 75% legibility.    Baseline  25% legibility at eval     Time  4    Period  Weeks    Status  On-going            Plan - 06/11/17 0848    Clinical Impression Statement  Patient continues to work on calibration of movements with use of larger amplitude of gait with cues.  Patient continues to demonstrate difficulty with stepping patterns when distracted but able to perform well with cues. Patient is improving on handwriting skills and memory with grandkids and great greatgrand kids names.     Occupational Profile and client history currently impacting functional performance  Parkinson's disease, tremors in B hands, decreased memory, history of CVA    Occupational performance deficits (Please refer to evaluation for details):  ADL's;IADL's;Social Participation;Leisure    Rehab Potential  Good    Current Impairments/barriers affecting progress:  memory deficits, past CVA    OT Frequency  4x / week    OT Duration  4 weeks    OT Treatment/Interventions  Self-care/ADL training;Patient/family education;Gait Training;Balance training;Stair Training;Functional Furniture conservator/restorer;Therapeutic exercise;Manual Therapy;Therapeutic activities;Neuromuscular education    Consulted and Agree with Plan of Care  Patient       Patient  will benefit from skilled therapeutic intervention in order to improve the following deficits and impairments:  Abnormal gait, Decreased endurance, Decreased strength, Decreased balance, Difficulty walking, Decreased mobility, Decreased cognition, Decreased coordination  Visit Diagnosis: Difficulty in walking, not elsewhere classified  Other lack of coordination  Muscle weakness (generalized)  Unsteadiness on feet  Problem List Patient Active Problem List   Diagnosis Date Noted  . Seizures ([place])   . Expressive aphasia   . CVA (cerebral vascular accident) [facility]) 05/05/2016   Kennady Zimmerle T Tomasita Morrow, OTR/L, CLT  Simrah Chatham 06/11/2017, 8:55 AM  [place] MAIN [facility] SERVICES [address] [place], [place], [address] Phone: [phone]   Fax:  [phone]  Name: Gilbert SCHWAN Sr. MRN: [id number] Date of Birth: 1940/07/29

## 2017-06-11 NOTE — Therapy (Signed)
[place] MAIN [facility] SERVICES [address] [place], [place], [address] Phone: [phone]   Fax:  [phone]  Occupational Therapy Treatment  Patient Details  Name: Gilbert FRANKSON Sr. MRN: [id number] Date of Birth: 11-02-1940 Referring Provider: Joselyn Arrow   Encounter Date: 06/02/2017  OT End of Session - 06/11/17 0749    Visit Number  6    Number of Visits  17    Date for OT Re-Evaluation  07/01/17    OT Start Time  1000    OT Stop Time  1059    OT Time Calculation (min)  59 min    Activity Tolerance  Patient tolerated treatment well    Behavior During Therapy  [facility] for tasks assessed/performed       Past Medical History:  Diagnosis Date  . Diabetes mellitus without complication ([place])   . Hypertension     Past Surgical History:  Procedure Laterality Date  . NO PAST SURGERIES      There were no vitals filed for this visit.  Subjective Assessment - 06/11/17 0745    Subjective   Patient reports he is trying to do his exercises at home, "I do much better here than at home"    Pertinent History  Patient was diagnosed with Parkinson's disease about 1 year ago, he also had a CVA in Jan 2018.  He reports since his stroke he has issues at times with memory, often gets in the car to go somewhere he is familiar with and has to pull off, think about the route he needs to take and then is able to get there.  He reports tremors in bilateral UEs with left worse than right.  He reports recent issues with transfers, walking and mild issues with self care tasks.     Patient Stated Goals  "I want to be normal"    Currently in Pain?  No/denies    Pain Score  0-No pain                   OT Treatments/Exercises (OP) - 06/11/17 0746      ADLs   ADL Comments  Patient seen for functional component tasks as follows:  1)  sit to stand from chair without the use of arms, 5 repetitions and cues for technique, 2)  crossing legs to reach to put  on socks, 3) managing buttons with implementation of hand flicks prior to buttons, 4)  handwriting with larger lined paper, 5) reciprocal arm and stepping patterns.       Neurological Re-education Exercises   Other Exercises 1  Patient seen for instruction of LSVT BIG exercises: LSVT Daily Session Maximal Daily Exercises: Sustained movements are designed to rescale the amplitude of movement output for generalization to daily functional activities. Performed as follows for 1 set of 10 repetitions each: Multi directional sustained movements- 1) Floor to ceiling, 2) Side to side. Multi directional Repetitive movements performed in standing and are designed to provide retraining effort needed for sustained muscle activation in tasks Performed as follows: 3) Step and reach forward, 4) Step and Reach Backwards, 5) Step and reach sideways, 6) Rock and reach forward/backward, 7) Rock and reach sideways. Requires min to CGA for all exercises in standing.  Sit to stand from mat table on lowest setting with cues for weight shift, technique and CGA for 10 reps for 1 set.   Patient performing reciprocal stepping exercise with CGA and cues for 10 reps each  foot, stair negotiation 4 steps for 5 reps each, cues for big turns, CGA.     Other Exercises 2  Functional mobility skills for 2 trials of 400 feet, indoors on flat surfaces, cues for turning behaviors and for amplitude of gait.  Cues for reciprocal arm swing.  Balance activities in standing with use of BOSU ball with flat side turned up, CGA for balance and performed with use of parallel bars.              OT Education - 06/11/17 0749    Education provided  Yes    Education Details  balance, maximal daily exercises    Person(s) Educated  Patient    Methods  Explanation;Demonstration;Verbal cues    Comprehension  Verbal cues required;Returned demonstration;Verbalized understanding          OT Long Term Goals - 06/01/17 1438      OT LONG TERM GOAL #1    Title  Patient will complete HEP for maximal daily exercises with modified independence in 4 weeks     Baseline  no current program     Time  4    Period  Weeks    Status  New    Target Date  07/01/17      OT LONG TERM GOAL #2   Title  Patient will improve gait speed and endurance and be able to walk 875 feet in 6 minutes to negotiate around the home and community safely in 4 weeks.     Time  4    Period  Weeks    Status  New    Target Date  07/01/17      OT LONG TERM GOAL #3   Title   Patient will perform sit to stand from lower and unstable surfaces with modified independence.      Baseline  difficulty with transitioning from sit to stand especially from lower surfaces.     Time  4    Period  Weeks    Status  New    Target Date  07/02/17      OT LONG TERM GOAL #4   Title  Patient will demonstrate performance of basic self care tasks with modified independence efficiently.    Baseline  increased time to complete tasks, occasional difficulty with putting pants on.     Time  4    Period  Weeks    Status  New    Target Date  07/01/17      OT LONG TERM GOAL #5   Title  Patient will complete handwriting with name and address with 75% legibility.    Baseline  25% legibility at eval     Time  4    Period  Weeks    Status  New    Target Date  07/01/17            Plan - 06/11/17 0749    Clinical Impression Statement  Patient to continue to perform exercises at home over the weekend 2 times a day for 10 repetitions each, he has his handouts at home with pictures and written instructions to refer to.  Patient demonstrates improvement with handwriting skills using larger lined paper which helps to cue him for larger sized letters.  Patient becoming more familiar with exercises however he still requires cues and therapist demo along with CGA for balance. Patient using chair at home for balance when needed during exercises.     Occupational Profile and client history currently  impacting functional performance  Parkinson's disease, tremors in B hands, decreased memory, history of CVA    Occupational performance deficits (Please refer to evaluation for details):  ADL's;IADL's;Social Participation;Leisure    Rehab Potential  Good    Current Impairments/barriers affecting progress:  memory deficits, past CVA    OT Frequency  4x / week    OT Duration  4 weeks    OT Treatment/Interventions  Self-care/ADL training;Patient/family education;Gait Training;Balance training;Stair Training;Functional Furniture conservator/restorer;Therapeutic exercise;Manual Therapy;Therapeutic activities;Neuromuscular education    Consulted and Agree with Plan of Care  Patient       Patient will benefit from skilled therapeutic intervention in order to improve the following deficits and impairments:  Abnormal gait, Decreased endurance, Decreased strength, Decreased balance, Difficulty walking, Decreased mobility, Decreased cognition, Decreased coordination  Visit Diagnosis: Difficulty in walking, not elsewhere classified  Other lack of coordination  Muscle weakness (generalized)  Unsteadiness on feet    Problem List Patient Active Problem List   Diagnosis Date Noted  . Seizures ([place])   . Expressive aphasia   . CVA (cerebral vascular accident) [facility]) 05/05/2016   Amy T Tomasita Morrow, OTR/L, CLT  Lovett,Amy 06/11/2017, 7:53 AM  [place] MAIN [facility] SERVICES [address] [place], [place], [address] Phone: [phone]   Fax:  [phone]  Name: Gilbert EWING Sr. MRN: [id number] Date of Birth: 07-31-1940

## 2017-06-11 NOTE — Therapy (Signed)
[place] MAIN [facility] SERVICES [address] [place], [place], [address] Phone: [phone]   Fax:  [phone]  Occupational Therapy Treatment  Patient Details  Name: Gilbert [name] Sr. MRN: [id number] Date of Birth: [birth year] Referring Provider: Joselyn Arrow   Encounter Date: 06/09/2017  OT End of Session - 06/11/17 0916    Visit Number  10    Number of Visits  17    Date for OT Re-Evaluation  07/01/17    OT Start Time  1000    OT Stop Time  1055    OT Time Calculation (min)  55 min    Activity Tolerance  Patient tolerated treatment well    Behavior During Therapy  [facility] for tasks assessed/performed       Past Medical History:  Diagnosis Date  . Diabetes mellitus without complication ([place])   . Hypertension     Past Surgical History:  Procedure Laterality Date  . NO PAST SURGERIES      There were no vitals filed for this visit.  Subjective Assessment - 06/11/17 0914    Subjective   Patient states, "Happy Valentine's Day!" Patient reports he is doing well, no complaints.     Pertinent History  Patient was diagnosed with Parkinson's disease about 1 year ago, he also had a CVA in Jan 2018.  He reports since his stroke he has issues at times with memory, often gets in the car to go somewhere he is familiar with and has to pull off, think about the route he needs to take and then is able to get there.  He reports tremors in bilateral UEs with left worse than right.  He reports recent issues with transfers, walking and mild issues with self care tasks.     Patient Stated Goals  "I want to be normal"    Currently in Pain?  No/denies    Pain Score  0-No pain                   OT Treatments/Exercises (OP) - 06/11/17 8144      ADLs   ADL Comments  Patient seen for functional component tasks with focus on sit to stand without use of arms from mat table, chair and bench, occasional cues for technique from lower surface.  Reaching  to feet for sock donning, managing buttons and use of hand flicks, handwriting with emphasis on letter size and formation as well as memory to recall grandkids and great grandkids names., reciprocal arm and leg patterns.       Neurological Re-education Exercises   Other Exercises 1  Patient seen for instruction of LSVT BIG exercises: LSVT Daily Session Maximal Daily Exercises: Sustained movements are designed to rescale the amplitude of movement output for generalization to daily functional activities. Performed as follows for 1 set of 10 repetitions each: Multi directional sustained movements- 1) Floor to ceiling, 2) Side to side. Multi directional Repetitive movements performed in standing and are designed to provide retraining effort needed for sustained muscle activation in tasks Performed as follows: 3) Step and reach forward, 4) Step and Reach Backwards, 5) Step and reach sideways, 6) Rock and reach forward/backward, 7) Rock and reach sideways. Requires CGA to SBA  for all exercises in standing.  Cues for arm positioning during exercises.    Other Exercises 2  Patient seen for functional mobility skills for 2 trials of 500 feet indoors with short rest breaks with opportunities for sit to stand  from a variety of surfaces, padded chairs, bench, mat. Occasional cues for step height and length, reciprocal arms and increased distractions.              OT Education - 06/11/17 0915    Education provided  Yes    Education Details  daily exercises, amplitude of gait, reciprocal arm swing    Person(s) Educated  Patient    Methods  Explanation;Demonstration;Verbal cues    Comprehension  Verbal cues required;Returned demonstration;Verbalized understanding          OT Long Term Goals - 06/11/17 5366      OT LONG TERM GOAL #1   Title  Patient will complete HEP for maximal daily exercises with modified independence in 4 weeks     Baseline  no current program     Time  4    Period  Weeks     Status  On-going      OT LONG TERM GOAL #2   Title  Patient will improve gait speed and endurance and be able to walk 875 feet in 6 minutes to negotiate around the home and community safely in 4 weeks.     Time  4    Period  Weeks    Status  On-going      OT LONG TERM GOAL #3   Title   Patient will perform sit to stand from lower and unstable surfaces with modified independence.      Baseline  difficulty with transitioning from sit to stand especially from lower surfaces.     Time  4    Period  Weeks    Status  On-going      OT LONG TERM GOAL #4   Title  Patient will demonstrate performance of basic self care tasks with modified independence efficiently.    Baseline  increased time to complete tasks, occasional difficulty with putting pants on.     Time  4    Period  Weeks    Status  On-going      OT LONG TERM GOAL #5   Title  Patient will complete handwriting with name and address with 75% legibility.    Baseline  25% legibility at eval     Time  4    Period  Weeks    Status  On-going            Plan - 06/11/17 0916    Clinical Impression Statement  Patient continues to benefit from skilled OT with participation in [place] program designed for patients with Parkinson's disease.  Given his challenges with memory and movement patterns, he benefits from repetition of tasks along with cues for proper technique.  Moving towards increased distractions during exercises and mobility to improve integration of skills.     Occupational Profile and client history currently impacting functional performance  Parkinson's disease, tremors in B hands, decreased memory, history of CVA    Occupational performance deficits (Please refer to evaluation for details):  ADL's;IADL's;Social Participation;Leisure    Rehab Potential  Good    Current Impairments/barriers affecting progress:  memory deficits, past CVA    OT Frequency  4x / week    OT Duration  4 weeks    OT Treatment/Interventions   Self-care/ADL training;Patient/family education;Gait Training;Balance training;Stair Training;Functional Furniture conservator/restorer;Therapeutic exercise;Manual Therapy;Therapeutic activities;Neuromuscular education    Consulted and Agree with Plan of Care  Patient       Patient will benefit from skilled therapeutic intervention in order to improve the  following deficits and impairments:  Abnormal gait, Decreased endurance, Decreased strength, Decreased balance, Difficulty walking, Decreased mobility, Decreased cognition, Decreased coordination  Visit Diagnosis: Difficulty in walking, not elsewhere classified  Other lack of coordination  Muscle weakness (generalized)  Unsteadiness on feet    Problem List Patient Active Problem List   Diagnosis Date Noted  . Seizures ([place])   . Expressive aphasia   . CVA (cerebral vascular accident) [facility]) 05/05/2016   Karmyn Lowman T Tomasita Morrow, OTR/L, CLT  Rayshun Kandler 06/11/2017, 9:23 AM  [place] MAIN [facility] SERVICES [address] [place], [place], [address] Phone: [phone]   Fax:  [phone]  Name: RYDEN WAINER Sr. MRN: [id number] Date of Birth: 03/26/1941

## 2017-06-13 ENCOUNTER — Ambulatory Visit: Payer: Non-veteran care | Admitting: Occupational Therapy

## 2017-06-13 DIAGNOSIS — R2681 Unsteadiness on feet: Secondary | ICD-10-CM

## 2017-06-13 DIAGNOSIS — R262 Difficulty in walking, not elsewhere classified: Secondary | ICD-10-CM

## 2017-06-13 DIAGNOSIS — R278 Other lack of coordination: Secondary | ICD-10-CM

## 2017-06-13 DIAGNOSIS — M6281 Muscle weakness (generalized): Secondary | ICD-10-CM

## 2017-06-14 ENCOUNTER — Ambulatory Visit: Payer: Non-veteran care | Admitting: Occupational Therapy

## 2017-06-14 DIAGNOSIS — R262 Difficulty in walking, not elsewhere classified: Secondary | ICD-10-CM

## 2017-06-14 DIAGNOSIS — M6281 Muscle weakness (generalized): Secondary | ICD-10-CM

## 2017-06-14 DIAGNOSIS — R278 Other lack of coordination: Secondary | ICD-10-CM

## 2017-06-14 DIAGNOSIS — R2681 Unsteadiness on feet: Secondary | ICD-10-CM

## 2017-06-15 ENCOUNTER — Ambulatory Visit: Payer: Non-veteran care | Admitting: Occupational Therapy

## 2017-06-15 DIAGNOSIS — M6281 Muscle weakness (generalized): Secondary | ICD-10-CM

## 2017-06-15 DIAGNOSIS — R262 Difficulty in walking, not elsewhere classified: Secondary | ICD-10-CM

## 2017-06-15 DIAGNOSIS — R2681 Unsteadiness on feet: Secondary | ICD-10-CM

## 2017-06-15 DIAGNOSIS — R278 Other lack of coordination: Secondary | ICD-10-CM

## 2017-06-16 ENCOUNTER — Ambulatory Visit: Payer: Non-veteran care | Admitting: Occupational Therapy

## 2017-06-16 DIAGNOSIS — M6281 Muscle weakness (generalized): Secondary | ICD-10-CM

## 2017-06-16 DIAGNOSIS — R262 Difficulty in walking, not elsewhere classified: Secondary | ICD-10-CM

## 2017-06-16 DIAGNOSIS — E039 Hypothyroidism, unspecified: Secondary | ICD-10-CM | POA: Diagnosis not present

## 2017-06-16 DIAGNOSIS — R2681 Unsteadiness on feet: Secondary | ICD-10-CM

## 2017-06-16 DIAGNOSIS — R278 Other lack of coordination: Secondary | ICD-10-CM

## 2017-06-16 DIAGNOSIS — Z794 Long term (current) use of insulin: Secondary | ICD-10-CM | POA: Diagnosis not present

## 2017-06-16 DIAGNOSIS — Z125 Encounter for screening for malignant neoplasm of prostate: Secondary | ICD-10-CM | POA: Diagnosis not present

## 2017-06-16 DIAGNOSIS — E782 Mixed hyperlipidemia: Secondary | ICD-10-CM | POA: Diagnosis not present

## 2017-06-16 DIAGNOSIS — E118 Type 2 diabetes mellitus with unspecified complications: Secondary | ICD-10-CM | POA: Diagnosis not present

## 2017-06-19 ENCOUNTER — Encounter: Payer: Self-pay | Admitting: Occupational Therapy

## 2017-06-19 NOTE — Therapy (Signed)
[place] MAIN [facility] SERVICES [address] [place], [place], [address] Phone: [phone]   Fax:  [phone]  Occupational Therapy Treatment  Patient Details  Name: Gilbert NICOSON Sr. MRN: [id number] Date of Birth: January 18, 1941 Referring Provider: Joselyn Arrow   Encounter Date: 06/13/2017  OT End of Session - 06/19/17 1938    Visit Number  11    Number of Visits  17    Date for OT Re-Evaluation  07/01/17    OT Start Time  1000    OT Stop Time  1056    OT Time Calculation (min)  56 min       Past Medical History:  Diagnosis Date  . Diabetes mellitus without complication ([place])   . Hypertension     Past Surgical History:  Procedure Laterality Date  . NO PAST SURGERIES      There were no vitals filed for this visit.  Subjective Assessment - 06/19/17 1936    Subjective   Patient reports he did his exercises over the weekend and feels he is doing better, still has to  refer to his notes/pictures at times. Did exercises 2 times a day.    Pertinent History  Patient was diagnosed with Parkinson's disease about 1 year ago, he also had a CVA in Jan 2018.  He reports since his stroke he has issues at times with memory, often gets in the car to go somewhere he is familiar with and has to pull off, think about the route he needs to take and then is able to get there.  He reports tremors in bilateral UEs with left worse than right.  He reports recent issues with transfers, walking and mild issues with self care tasks.     Patient Stated Goals  "I want to be normal"    Currently in Pain?  No/denies    Pain Score  0-No pain                   OT Treatments/Exercises (OP) - 06/19/17 1937      ADLs   ADL Comments  Patient seen for functional component tasks with focus on sit to stand without use of arms from mat table, chair and bench, occasional cues for technique from lower surface.  Reaching to feet for sock donning, managing buttons and  use of hand flicks, handwriting with emphasis on letter size and formation as well as memory to recall grandkids and great grandkids names., reciprocal arm and leg patterns.       Neurological Re-education Exercises   Other Exercises 1  Patient seen for instruction of LSVT BIG exercises: LSVT Daily Session Maximal Daily Exercises: Sustained movements are designed to rescale the amplitude of movement output for generalization to daily functional activities. Performed as follows for 1 set of 10 repetitions each: Multi directional sustained movements- 1) Floor to ceiling, 2) Side to side. Multi directional Repetitive movements performed in standing and are designed to provide retraining effort needed for sustained muscle activation in tasks Performed as follows: 3) Step and reach forward, 4) Step and Reach Backwards, 5) Step and reach sideways, 6) Rock and reach forward/backward, 7) Rock and reach sideways. Requires CGA to SBA  for all exercises in standing.  Cues for arm positioning during exercises.    Other Exercises 2  Functional mobility skills for 1 trial of 800 feet, indoors on flat surfaces, cues for turning behaviors and for amplitude of gait as well as occasional Cues  for reciprocal arm swing.  Patient instructed on heel to toe strike on the right LLE and able to demonstrate with cues but will need to focus on this skill to become more consistent with his gait patterns.   Balance activities in standing with use of balance pad, tossing balls into container with CGA for balance.  Patient also seen for balance and body mechanics with picking balls up off the floor to place into basket.             OT Education - 06/19/17 1938    Education provided  Yes    Education Details  LSVT BIG, amplitude of gait    Person(s) Educated  Patient    Methods  Explanation;Demonstration;Verbal cues    Comprehension  Verbal cues required;Returned demonstration;Verbalized understanding          OT Long  Term Goals - 06/19/17 1939      OT LONG TERM GOAL #1   Title  Patient will complete HEP for maximal daily exercises with modified independence in 4 weeks     Baseline  no current program     Time  4    Period  Weeks    Status  On-going      OT LONG TERM GOAL #2   Title  Patient will improve gait speed and endurance and be able to walk 875 feet in 6 minutes to negotiate around the home and community safely in 4 weeks.     Time  4    Period  Weeks    Status  On-going      OT LONG TERM GOAL #3   Title   Patient will perform sit to stand from lower and unstable surfaces with modified independence.      Baseline  difficulty with transitioning from sit to stand especially from lower surfaces.     Time  4    Period  Weeks    Status  On-going      OT LONG TERM GOAL #4   Title  Patient will demonstrate performance of basic self care tasks with modified independence efficiently.    Baseline  increased time to complete tasks, occasional difficulty with putting pants on.     Time  4    Period  Weeks    Status  On-going      OT LONG TERM GOAL #5   Title  Patient will complete handwriting with name and address with 75% legibility.    Baseline  25% legibility at eval     Time  4    Period  Weeks    Status  On-going            Plan - 06/19/17 1938    Clinical Impression Statement  Patient tends to step on the right with a shortened step pattern often without a heel strike and weight shifted more to the toe part of his foot.  When cued, he can often demonstrate and correct this but will revert back to old pattern if distracted or if engaged in a conversation.  Will continue to reinforce a heel to toe gait pattern with increased repetition to drive motor learning to become more habitual in nature.  Continued cues for amplitude of gait.     Occupational Profile and client history currently impacting functional performance  Parkinson's disease, tremors in B hands, decreased memory, history  of CVA    Occupational performance deficits (Please refer to evaluation for details):  ADL's;IADL's;Social Participation;Leisure  Rehab Potential  Good    Current Impairments/barriers affecting progress:  memory deficits, past CVA    OT Frequency  4x / week    OT Duration  4 weeks    OT Treatment/Interventions  Self-care/ADL training;Patient/family education;Gait Training;Balance training;Stair Training;Functional Furniture conservator/restorer;Therapeutic exercise;Manual Therapy;Therapeutic activities;Neuromuscular education    Consulted and Agree with Plan of Care  Patient       Patient will benefit from skilled therapeutic intervention in order to improve the following deficits and impairments:  Abnormal gait, Decreased endurance, Decreased strength, Decreased balance, Difficulty walking, Decreased mobility, Decreased cognition, Decreased coordination  Visit Diagnosis: Difficulty in walking, not elsewhere classified  Other lack of coordination  Muscle weakness (generalized)  Unsteadiness on feet    Problem List Patient Active Problem List   Diagnosis Date Noted  . Seizures ([place])   . Expressive aphasia   . CVA (cerebral vascular accident) [facility]) 05/05/2016   Amy T Tomasita Morrow, OTR/L, CLT  Lovett,Amy 06/19/2017, 7:40 PM  [place] MAIN [facility] SERVICES [address] [place], [place], [address] Phone: [phone]   Fax:  [phone]  Name: OLIVERIO CHO Sr. MRN: [id number] Date of Birth: 06/29/1940

## 2017-06-19 NOTE — Therapy (Signed)
[place] MAIN [facility] SERVICES [address] [place], [place], [address] Phone: [phone]   Fax:  [phone]  Occupational Therapy Treatment  Patient Details  Name: Gilbert URBAS Sr. MRN: [id number] Date of Birth: 06-09-1940 Referring Provider: Joselyn Arrow   Encounter Date: 06/15/2017  OT End of Session - 06/19/17 2003    Visit Number  13    Number of Visits  17    Date for OT Re-Evaluation  07/01/17    OT Start Time  1000    OT Stop Time  1100    OT Time Calculation (min)  60 min    Activity Tolerance  Patient tolerated treatment well    Behavior During Therapy  [facility] for tasks assessed/performed       Past Medical History:  Diagnosis Date  . Diabetes mellitus without complication ([place])   . Hypertension     Past Surgical History:  Procedure Laterality Date  . NO PAST SURGERIES      There were no vitals filed for this visit.  Subjective Assessment - 06/19/17 2002    Subjective   Patient reports he is doing well, wishes the rain would go away and things dry out a bit.     Pertinent History  Patient was diagnosed with Parkinson's disease about 1 year ago, he also had a CVA in Jan 2018.  He reports since his stroke he has issues at times with memory, often gets in the car to go somewhere he is familiar with and has to pull off, think about the route he needs to take and then is able to get there.  He reports tremors in bilateral UEs with left worse than right.  He reports recent issues with transfers, walking and mild issues with self care tasks.     Patient Stated Goals  "I want to be normal"    Currently in Pain?  No/denies    Pain Score  0-No pain                   OT Treatments/Exercises (OP) - 06/19/17 2002      ADLs   ADL Comments  Patient seen for functional component tasks with focus on sit to stand without use of arms from mat table, chair and bench, occasional cues for technique from lower surface.  Reaching  to feet for sock donning, managing buttons and use of hand flicks, handwriting with emphasis on letter size and formation as well as memory to recall grandkids and great grandkids names., reciprocal arm and leg patterns.       Neurological Re-education Exercises   Other Exercises 1  Patient seen for instruction of LSVT BIG exercises: LSVT Daily Session Maximal Daily Exercises: Sustained movements are designed to rescale the amplitude of movement output for generalization to daily functional activities. Performed as follows for 1 set of 10 repetitions each: Multi directional sustained movements- 1) Floor to ceiling, 2) Side to side. Multi directional Repetitive movements performed in standing and are designed to provide retraining effort needed for sustained muscle activation in tasks Performed as follows: 3) Step and reach forward, 4) Step and Reach Backwards, 5) Step and reach sideways, 6) Rock and reach forward/backward, 7) Rock and reach sideways. Requires CGA to SBA  for all exercises in standing.  Cues for arm positioning during exercises.    Other Exercises 2  Patient seen for continued focus on handwriting skills, combined with memory strategies to recall names of kids, grandkids  and great grand kids.  Patient able to write all 6, 17, and  names with minimal cues, when writing on unlined paper, patient tends to write smaller without a guide or cue provided.              OT Education - 06/19/17 2003    Education provided  Yes    Education Details  HEP    Person(s) Educated  Patient    Methods  Explanation;Demonstration;Verbal cues    Comprehension  Verbal cues required;Returned demonstration;Verbalized understanding          OT Long Term Goals - 06/19/17 1939      OT LONG TERM GOAL #1   Title  Patient will complete HEP for maximal daily exercises with modified independence in 4 weeks     Baseline  no current program     Time  4    Period  Weeks    Status  On-going      OT  LONG TERM GOAL #2   Title  Patient will improve gait speed and endurance and be able to walk 875 feet in 6 minutes to negotiate around the home and community safely in 4 weeks.     Time  4    Period  Weeks    Status  On-going      OT LONG TERM GOAL #3   Title   Patient will perform sit to stand from lower and unstable surfaces with modified independence.      Baseline  difficulty with transitioning from sit to stand especially from lower surfaces.     Time  4    Period  Weeks    Status  On-going      OT LONG TERM GOAL #4   Title  Patient will demonstrate performance of basic self care tasks with modified independence efficiently.    Baseline  increased time to complete tasks, occasional difficulty with putting pants on.     Time  4    Period  Weeks    Status  On-going      OT LONG TERM GOAL #5   Title  Patient will complete handwriting with name and address with 75% legibility.    Baseline  25% legibility at eval     Time  4    Period  Weeks    Status  On-going            Plan - 06/19/17 2004    Clinical Impression Statement  Patient continues to work towards consistency towards normal gait patterns on even surfaces, weather has not permitted to ambulate outdoors in more challenging environment this week. Patient continues to require cues and instruction on strategies to detect when he is performing movement patterns incorrectly and how to change pattern to produce greater amplitude of movement as well as heel to toe gait pattern.    Occupational Profile and client history currently impacting functional performance  Parkinson's disease, tremors in B hands, decreased memory, history of CVA    Occupational performance deficits (Please refer to evaluation for details):  ADL's;IADL's;Social Participation;Leisure    Rehab Potential  Good    Current Impairments/barriers affecting progress:  memory deficits, past CVA    OT Frequency  4x / week    OT Treatment/Interventions   Self-care/ADL training;Patient/family education;Gait Training;Balance training;Stair Training;Functional Furniture conservator/restorer;Therapeutic exercise;Manual Therapy;Therapeutic activities;Neuromuscular education    Consulted and Agree with Plan of Care  Patient       Patient will benefit from skilled therapeutic  intervention in order to improve the following deficits and impairments:  Abnormal gait, Decreased endurance, Decreased strength, Decreased balance, Difficulty walking, Decreased mobility, Decreased cognition, Decreased coordination  Visit Diagnosis: Difficulty in walking, not elsewhere classified  Other lack of coordination  Muscle weakness (generalized)  Unsteadiness on feet    Problem List Patient Active Problem List   Diagnosis Date Noted  . Seizures ([place])   . Expressive aphasia   . CVA (cerebral vascular accident) [facility]) 05/05/2016   Mayley Lish T Tomasita Morrow, OTR/L, CLT  Denora Wysocki 06/19/2017, 8:05 PM  [place] MAIN [facility] SERVICES [address] [place], [place], [address] Phone: [phone]   Fax:  [phone]  Name: Gilbert MALTOS Sr. MRN: [id number] Date of Birth: April 03, 1941

## 2017-06-19 NOTE — Therapy (Signed)
[place] MAIN [facility] SERVICES [address] [place], [place], [address] Phone: [phone]   Fax:  [phone]  Occupational Therapy Treatment  Patient Details  Name: Gilbert NICOTRA Sr. MRN: [id number] Date of Birth: [birth year] Referring Provider: Joselyn Arrow   Encounter Date: 06/14/2017  OT End of Session - 06/19/17 1952    Visit Number  12    Number of Visits  17    Date for OT Re-Evaluation  07/01/17    OT Start Time  1001    OT Stop Time  1100    OT Time Calculation (min)  59 min    Activity Tolerance  Patient tolerated treatment well    Behavior During Therapy  [facility] for tasks assessed/performed       Past Medical History:  Diagnosis Date  . Diabetes mellitus without complication ([place])   . Hypertension     Past Surgical History:  Procedure Laterality Date  . NO PAST SURGERIES      There were no vitals filed for this visit.  Subjective Assessment - 06/19/17 1949    Subjective   Patient reports the weather is bad may be bad tomorrow and is not sure if he will be able to make it to therapy.     Pertinent History  Patient was diagnosed with Parkinson's disease about 1 year ago, he also had a CVA in Jan 2018.  He reports since his stroke he has issues at times with memory, often gets in the car to go somewhere he is familiar with and has to pull off, think about the route he needs to take and then is able to get there.  He reports tremors in bilateral UEs with left worse than right.  He reports recent issues with transfers, walking and mild issues with self care tasks.     Patient Stated Goals  "I want to be normal"    Currently in Pain?  No/denies    Pain Score  0-No pain                   OT Treatments/Exercises (OP) - 06/19/17 1949      ADLs   ADL Comments  Patient seen for functional component tasks with focus on sit to stand without use of arms from mat table, chair and bench, occasional cues for technique from  lower surface.  Reaching to feet for sock donning, managing buttons and use of hand flicks, handwriting with emphasis on letter size and formation as well as memory to recall grandkids and great grandkids names., reciprocal arm and leg patterns.       Neurological Re-education Exercises   Other Exercises 1  Patient seen for instruction of LSVT BIG exercises: LSVT Daily Session Maximal Daily Exercises: Sustained movements are designed to rescale the amplitude of movement output for generalization to daily functional activities. Performed as follows for 1 set of 10 repetitions each: Multi directional sustained movements- 1) Floor to ceiling, 2) Side to side. Multi directional Repetitive movements performed in standing and are designed to provide retraining effort needed for sustained muscle activation in tasks Performed as follows: 3) Step and reach forward, 4) Step and Reach Backwards, 5) Step and reach sideways, 6) Rock and reach forward/backward, 7) Rock and reach sideways. Requires CGA to SBA  for all exercises in standing.  Cues for arm positioning during exercises.    Other Exercises 2  Patient performing balance tasks in standing with BOSU ball with flat side  up within parallel bars this date with cues and CGA from therapist.  Difficulty at times to balance without use of hands on bars, cues for foot placement and weight shifting patterns for dynamic balance patterns. Patient performing functional mobility tasks indoors with emphasis on heel to toe strike patterns with gait with cues and increased attention to movement pattern.  Patient continues to demonstrate pattern but cannot perform consistently with prolonged walking.              OT Education - 06/19/17 1950    Education provided  Yes    Education Details  LSVT BIG exercises, reciprocal arm swing, functional mobility    Person(s) Educated  Patient    Methods  Explanation;Demonstration;Verbal cues    Comprehension  Verbal cues  required;Returned demonstration;Verbalized understanding          OT Long Term Goals - 06/19/17 1939      OT LONG TERM GOAL #1   Title  Patient will complete HEP for maximal daily exercises with modified independence in 4 weeks     Baseline  no current program     Time  4    Period  Weeks    Status  On-going      OT LONG TERM GOAL #2   Title  Patient will improve gait speed and endurance and be able to walk 875 feet in 6 minutes to negotiate around the home and community safely in 4 weeks.     Time  4    Period  Weeks    Status  On-going      OT LONG TERM GOAL #3   Title   Patient will perform sit to stand from lower and unstable surfaces with modified independence.      Baseline  difficulty with transitioning from sit to stand especially from lower surfaces.     Time  4    Period  Weeks    Status  On-going      OT LONG TERM GOAL #4   Title  Patient will demonstrate performance of basic self care tasks with modified independence efficiently.    Baseline  increased time to complete tasks, occasional difficulty with putting pants on.     Time  4    Period  Weeks    Status  On-going      OT LONG TERM GOAL #5   Title  Patient will complete handwriting with name and address with 75% legibility.    Baseline  25% legibility at eval     Time  4    Period  Weeks    Status  On-going            Plan - 06/19/17 1953    Clinical Impression Statement  Patient continues to improve with performance of maximal daily exercises, he continues to require verbal cues and therapist demo of arm positioning for select exercises but once cued, he recalls movement and is able to complete set without additional cues. Patient continues to work towards improving gait pattern especially on the right side with attempts to increase consistency of pattern while walking longer distances and with increased distractions. Patient continues to benefit from skilled OT for intensive LSVT BiG program to  address functional mobility, balance, ADL and IADL tasks.      Occupational Profile and client history currently impacting functional performance  Parkinson's disease, tremors in B hands, decreased memory, history of CVA    Occupational performance deficits (Please refer to evaluation for  details):  ADL's;IADL's;Social Participation;Leisure    Rehab Potential  Good    Current Impairments/barriers affecting progress:  memory deficits, past CVA    OT Frequency  4x / week    OT Duration  4 weeks    OT Treatment/Interventions  Self-care/ADL training;Patient/family education;Gait Training;Balance training;Stair Training;Functional Mobility Training;Therapeutic exercise;Manual Therapy;Therapeutic activities;Neuromuscular education    Consulted and Agree with Plan of Care  Patient       Patient will benefit from skilled therapeutic intervention in order to improve the following deficits and impairments:  Abnormal gait, Decreased endurance, Decreased strength, Decreased balance, Difficulty walking, Decreased mobility, Decreased cognition, Decreased coordination  Visit Diagnosis: Difficulty in walking, not elsewhere classified  Other lack of coordination  Muscle weakness (generalized)  Unsteadiness on feet    Problem List Patient Active Problem List   Diagnosis Date Noted  . Seizures ([place])   . Expressive aphasia   . CVA (cerebral vascular accident) [facility]) 05/05/2016   Stephene Alegria T Tomasita Morrow, OTR/L, CLT  Righteous Claiborne 06/19/2017, 7:54 PM  [place] MAIN [facility] SERVICES [address] [place], [place], [address] Phone: [phone]   Fax:  [phone]  Name: Gilbert JUDICE Sr. MRN: [id number] Date of Birth: 08-01-1940

## 2017-06-19 NOTE — Therapy (Signed)
[place] MAIN [facility] SERVICES [address] [place], [place], [address] Phone: [phone]   Fax:  [phone]  Occupational Therapy Treatment  Patient Details  Name: Gilbert TOLLIVER Sr. MRN: [id number] Date of Birth: 02-03-1941 Referring Provider: Joselyn Arrow   Encounter Date: 06/16/2017  OT End of Session - 06/19/17 2007    Visit Number  14    Number of Visits  17    Date for OT Re-Evaluation  07/01/17    OT Start Time  1001    OT Stop Time  1056    OT Time Calculation (min)  55 min    Activity Tolerance  Patient tolerated treatment well    Behavior During Therapy  [facility] for tasks assessed/performed       Past Medical History:  Diagnosis Date  . Diabetes mellitus without complication ([place])   . Hypertension     Past Surgical History:  Procedure Laterality Date  . NO PAST SURGERIES      There were no vitals filed for this visit.  Subjective Assessment - 06/19/17 2006    Subjective   Patient reports he is consistently doing exercises daily, still uses his instructions as needed and is aware he will continue with exercises 2 times a day over the weekend.  Reports he doesn't feel as focused this morning as he has other mornings for some reason.     Pertinent History  Patient was diagnosed with Parkinson's disease about 1 year ago, he also had a CVA in Jan 2018.  He reports since his stroke he has issues at times with memory, often gets in the car to go somewhere he is familiar with and has to pull off, think about the route he needs to take and then is able to get there.  He reports tremors in bilateral UEs with left worse than right.  He reports recent issues with transfers, walking and mild issues with self care tasks.     Patient Stated Goals  "I want to be normal"    Currently in Pain?  No/denies    Pain Score  0-No pain                   OT Treatments/Exercises (OP) - 06/19/17 2006      ADLs   ADL Comments  Patient  seen for functional component tasks with focus on sit to stand without use of arms from mat table, chair and bench, occasional cues for technique from lower surface.  Reaching to feet for sock donning, managing buttons and use of hand flicks, handwriting with emphasis on letter size and formation as well as memory to recall grandkids and great grandkids names., reciprocal arm and leg patterns.       Neurological Re-education Exercises   Other Exercises 1  Patient seen for instruction of LSVT BIG exercises: LSVT Daily Session Maximal Daily Exercises: Sustained movements are designed to rescale the amplitude of movement output for generalization to daily functional activities. Performed as follows for 1 set of 10 repetitions each: Multi directional sustained movements- 1) Floor to ceiling, 2) Side to side. Multi directional Repetitive movements performed in standing and are designed to provide retraining effort needed for sustained muscle activation in tasks Performed as follows: 3) Step and reach forward, 4) Step and Reach Backwards, 5) Step and reach sideways, 6) Rock and reach forward/backward, 7) Rock and reach sideways. Requires CGA to SBA  for all exercises in standing.    Other  Exercises 2  Patient seen for functional mobility for 2 sets of 500 feet each with cues for reciprocal arm swing, increased amplitude of gait and heel to toe gait patterns on the right.  Patient is beginning to show more signs of being able to detect when gait patterns decrease and able to self correct at times. Balance tasks in standing with use of balance pad and engagement of bilateral UEs into a task involving weight shifting patterns.             OT Education - 06/19/17 2007    Education Details  maximal daily exercises, amplitude of gait    Person(s) Educated  Patient    Methods  Explanation;Demonstration;Verbal cues    Comprehension  Verbal cues required;Returned demonstration;Verbalized understanding           OT Long Term Goals - 06/19/17 1939      OT LONG TERM GOAL #1   Title  Patient will complete HEP for maximal daily exercises with modified independence in 4 weeks     Baseline  no current program     Time  4    Period  Weeks    Status  On-going      OT LONG TERM GOAL #2   Title  Patient will improve gait speed and endurance and be able to walk 875 feet in 6 minutes to negotiate around the home and community safely in 4 weeks.     Time  4    Period  Weeks    Status  On-going      OT LONG TERM GOAL #3   Title   Patient will perform sit to stand from lower and unstable surfaces with modified independence.      Baseline  difficulty with transitioning from sit to stand especially from lower surfaces.     Time  4    Period  Weeks    Status  On-going      OT LONG TERM GOAL #4   Title  Patient will demonstrate performance of basic self care tasks with modified independence efficiently.    Baseline  increased time to complete tasks, occasional difficulty with putting pants on.     Time  4    Period  Weeks    Status  On-going      OT LONG TERM GOAL #5   Title  Patient will complete handwriting with name and address with 75% legibility.    Baseline  25% legibility at eval     Time  4    Period  Weeks    Status  On-going            Plan - 06/19/17 2008    Clinical Impression Statement  Patient demonstrating improvement in balance with use of balance pad with increased ability to shift weight and utilize bilateral UEs during task with decreased assistance and decreased loss of balance.  Patient continues to focus on consistency of gait during functional mobility and is able to perform greater distances with decreased rest breaks and decreased cues.     Occupational Profile and client history currently impacting functional performance  Parkinson's disease, tremors in B hands, decreased memory, history of CVA    Occupational performance deficits (Please refer to evaluation  for details):  ADL's;IADL's;Social Participation;Leisure    Rehab Potential  Good    Current Impairments/barriers affecting progress:  memory deficits, past CVA    OT Frequency  4x / week    OT Duration  4 weeks    OT Treatment/Interventions  Self-care/ADL training;Patient/family education;Gait Training;Balance training;Stair Training;Functional Furniture conservator/restorer;Therapeutic exercise;Manual Therapy;Therapeutic activities;Neuromuscular education    Consulted and Agree with Plan of Care  Patient       Patient will benefit from skilled therapeutic intervention in order to improve the following deficits and impairments:  Abnormal gait, Decreased endurance, Decreased strength, Decreased balance, Difficulty walking, Decreased mobility, Decreased cognition, Decreased coordination  Visit Diagnosis: Difficulty in walking, not elsewhere classified  Other lack of coordination  Muscle weakness (generalized)  Unsteadiness on feet    Problem List Patient Active Problem List   Diagnosis Date Noted  . Seizures ([place])   . Expressive aphasia   . CVA (cerebral vascular accident) [facility]) 05/05/2016   Amy T Tomasita Morrow, OTR/L, CLT  Lovett,Amy 06/19/2017, 8:09 PM  [place] MAIN [facility] SERVICES [address] [place], [place], [address] Phone: [phone]   Fax:  [phone]  Name: Gilbert REHBERG Sr. MRN: [id number] Date of Birth: 1940/12/10

## 2017-06-20 ENCOUNTER — Ambulatory Visit: Payer: Non-veteran care | Admitting: Occupational Therapy

## 2017-06-20 ENCOUNTER — Encounter: Payer: Self-pay | Admitting: Occupational Therapy

## 2017-06-20 DIAGNOSIS — R278 Other lack of coordination: Secondary | ICD-10-CM

## 2017-06-20 DIAGNOSIS — R2681 Unsteadiness on feet: Secondary | ICD-10-CM

## 2017-06-20 DIAGNOSIS — M6281 Muscle weakness (generalized): Secondary | ICD-10-CM

## 2017-06-20 DIAGNOSIS — R262 Difficulty in walking, not elsewhere classified: Secondary | ICD-10-CM | POA: Diagnosis not present

## 2017-06-21 ENCOUNTER — Encounter: Payer: Self-pay | Admitting: Occupational Therapy

## 2017-06-21 ENCOUNTER — Ambulatory Visit: Payer: Non-veteran care | Admitting: Occupational Therapy

## 2017-06-21 DIAGNOSIS — R262 Difficulty in walking, not elsewhere classified: Secondary | ICD-10-CM | POA: Diagnosis not present

## 2017-06-21 DIAGNOSIS — R278 Other lack of coordination: Secondary | ICD-10-CM

## 2017-06-21 DIAGNOSIS — M6281 Muscle weakness (generalized): Secondary | ICD-10-CM

## 2017-06-21 DIAGNOSIS — R2681 Unsteadiness on feet: Secondary | ICD-10-CM

## 2017-06-21 NOTE — Therapy (Signed)
[place] MAIN [facility] SERVICES [address] [place], [place], [address] Phone: [phone]   Fax:  [phone]  Occupational Therapy Treatment  Patient Details  Name: Gilbert [name] Sr. MRN: [id number] Date of Birth: April 21, 1941 Referring Provider: Joselyn Arrow   Encounter Date: 06/20/2017  OT End of Session - 06/21/17 2208    Visit Number  15    Number of Visits  17    Date for OT Re-Evaluation  07/01/17    OT Start Time  1005    OT Stop Time  1100    OT Time Calculation (min)  55 min    Activity Tolerance  Patient tolerated treatment well    Behavior During Therapy  [facility] for tasks assessed/performed       Past Medical History:  Diagnosis Date  . Diabetes mellitus without complication ([place])   . Hypertension     Past Surgical History:  Procedure Laterality Date  . NO PAST SURGERIES      There were no vitals filed for this visit.  Subjective Assessment - 06/21/17 2207    Subjective   Patient reports he got lost when trying to go to church yesterday.  Reports he left the house wrong and then had to stop and figure out which way to go. "I was supposed to go down [address] and I went  [address] instead but I finally figured it out."    Pertinent History  Patient was diagnosed with Parkinson's disease about 1 year ago, he also had a CVA in Jan 2018.  He reports since his stroke he has issues at times with memory, often gets in the car to go somewhere he is familiar with and has to pull off, think about the route he needs to take and then is able to get there.  He reports tremors in bilateral UEs with left worse than right.  He reports recent issues with transfers, walking and mild issues with self care tasks.     Patient Stated Goals  "I want to be normal"    Currently in Pain?  No/denies    Pain Score  0-No pain                   OT Treatments/Exercises (OP) - 06/21/17 2202      ADLs   ADL Comments  Patient seen for  reciprocal stepping patterns, stair negotiation with SBA, occasional cues for amplitude of turns at the landing, handwriting for kids, grandkids and great grand kids without any help or assistance to recall names this date.  Reaching to feet for sock donning.        Neurological Re-education Exercises   Other Exercises 1  Patient seen for instruction of LSVT BIG exercises: LSVT Daily Session Maximal Daily Exercises: Sustained movements are designed to rescale the amplitude of movement output for generalization to daily functional activities. Performed as follows for 1 set of 10 repetitions each: Multi directional sustained movements- 1) Floor to ceiling, 2) Side to side. Multi directional Repetitive movements performed in standing and are designed to provide retraining effort needed for sustained muscle activation in tasks Performed as follows: 3) Step and reach forward, 4) Step and Reach Backwards, 5) Step and reach sideways, 6) Rock and reach forward/backward, 7) Rock and reach sideways. Requires SBA  for all exercises in standing.  Added complexity to exercises this date by alternating sides with exercises for all except floor to ceiling and rock and reach.  Patient able to demonstrate with cues and therapist demo, SBA for balance.    Other Exercises 2  Patient seen for reciprocal toe tapping for 10 reps, stair negotiation for 5 reps with cues for turns, functional mobility for 1 trial of 800 feet without rest break with cues for reciprocal arm swing and amplitude of gait.              OT Education - 06/21/17 2207    Education provided  Yes    Education Details  route planning when driving    Person(s) Educated  Patient    Methods  Explanation;Demonstration    Comprehension  Verbalized understanding;Returned demonstration          OT Long Term Goals - 06/19/17 1939      OT LONG TERM GOAL #1   Title  Patient will complete HEP for maximal daily exercises with modified independence in 4  weeks     Baseline  no current program     Time  4    Period  Weeks    Status  On-going      OT LONG TERM GOAL #2   Title  Patient will improve gait speed and endurance and be able to walk 875 feet in 6 minutes to negotiate around the home and community safely in 4 weeks.     Time  4    Period  Weeks    Status  On-going      OT LONG TERM GOAL #3   Title   Patient will perform sit to stand from lower and unstable surfaces with modified independence.      Baseline  difficulty with transitioning from sit to stand especially from lower surfaces.     Time  4    Period  Weeks    Status  On-going      OT LONG TERM GOAL #4   Title  Patient will demonstrate performance of basic self care tasks with modified independence efficiently.    Baseline  increased time to complete tasks, occasional difficulty with putting pants on.     Time  4    Period  Weeks    Status  On-going      OT LONG TERM GOAL #5   Title  Patient will complete handwriting with name and address with 75% legibility.    Baseline  25% legibility at eval     Time  4    Period  Weeks    Status  On-going              Patient will benefit from skilled therapeutic intervention in order to improve the following deficits and impairments:  Abnormal gait, Decreased endurance, Decreased strength, Decreased balance, Difficulty walking, Decreased mobility, Decreased cognition, Decreased coordination  Visit Diagnosis: Difficulty in walking, not elsewhere classified  Other lack of coordination  Muscle weakness (generalized)  Unsteadiness on feet    Problem List Patient Active Problem List   Diagnosis Date Noted  . Seizures ([place])   . Expressive aphasia   . CVA (cerebral vascular accident) [facility]) 05/05/2016   Peirce Deveney T Tomasita Morrow, OTR/L, CLT  Collin Rengel 06/21/2017, 10:08 PM  [place] MAIN [facility] SERVICES [address] [place], [place], [address] Phone: [phone]   Fax:   [phone]  Name: Gilbert JOYNT Sr. MRN: [id number] Date of Birth: 07-Feb-1941

## 2017-06-22 ENCOUNTER — Encounter: Payer: Self-pay | Admitting: Occupational Therapy

## 2017-06-22 ENCOUNTER — Ambulatory Visit: Payer: Non-veteran care | Admitting: Occupational Therapy

## 2017-06-22 DIAGNOSIS — R2681 Unsteadiness on feet: Secondary | ICD-10-CM

## 2017-06-22 DIAGNOSIS — M6281 Muscle weakness (generalized): Secondary | ICD-10-CM

## 2017-06-22 DIAGNOSIS — R262 Difficulty in walking, not elsewhere classified: Secondary | ICD-10-CM | POA: Diagnosis not present

## 2017-06-22 DIAGNOSIS — R278 Other lack of coordination: Secondary | ICD-10-CM

## 2017-06-22 NOTE — Therapy (Signed)
[place] MAIN [facility] SERVICES [address] [place], [place], [address] Phone: [phone]   Fax:  [phone]  Occupational Therapy Treatment  Patient Details  Name: Gilbert [name] Sr. MRN: [id number] Date of Birth: 1940-05-01 Referring Provider: Joselyn Arrow   Encounter Date: 06/21/2017  OT End of Session - 06/22/17 1015    Visit Number  15    Number of Visits  17    Date for OT Re-Evaluation  07/01/17    OT Start Time  1000    OT Stop Time  1100    OT Time Calculation (min)  60 min    Activity Tolerance  Patient tolerated treatment well    Behavior During Therapy  [facility] for tasks assessed/performed       Past Medical History:  Diagnosis Date  . Diabetes mellitus without complication ([place])   . Hypertension     Past Surgical History:  Procedure Laterality Date  . NO PAST SURGERIES      There were no vitals filed for this visit.  Subjective Assessment - 06/22/17 1004    Subjective   Patient reports he did not get to do all the exercises last night.  Was a bit confused with alternating sides when trying exercises at home last night.    Pertinent History  Patient was diagnosed with Parkinson's disease about 1 year ago, he also had a CVA in Jan 2018.  He reports since his stroke he has issues at times with memory, often gets in the car to go somewhere he is familiar with and has to pull off, think about the route he needs to take and then is able to get there.  He reports tremors in bilateral UEs with left worse than right.  He reports recent issues with transfers, walking and mild issues with self care tasks.     Patient Stated Goals  "I want to be normal"    Currently in Pain?  No/denies    Pain Score  0-No pain                   OT Treatments/Exercises (OP) - 06/22/17 1026      ADLs   ADL Comments  Patient seen for sit to stand from a variety of surfaces with supervision, reciprocal stepping patterns with SBA, stair  negotiation with SBA and cues for turns, handwriting for names of kids, grandkids and great grand kids, reaching down to feet to put on socks and shoes.       Neurological Re-education Exercises   Other Exercises 1  Patient seen for instruction of LSVT BIG exercises: LSVT Daily Session Maximal Daily Exercises: Sustained movements are designed to rescale the amplitude of movement output for generalization to daily functional activities. Performed as follows for 1 set of 10 repetitions each: Multi directional sustained movements- 1) Floor to ceiling, 2) Side to side. Multi directional Repetitive movements performed in standing and are designed to provide retraining effort needed for sustained muscle activation in tasks Performed as follows: 3) Step and reach forward, 4) Step and Reach Backwards, 5) Step and reach sideways, 6) Rock and reach forward/backward, 7) Rock and reach sideways. Requires SBA  for all exercises in standing.  Increased complexity to exercises this date by alternating sides with exercises for all except floor to ceiling and rock and reach.  Patient able to demonstrate with cues and therapist demo, SBA for balance.    Other Exercises 2  Patient seen for functional  mobility skills for 850 feet for one trial, even surfaces, cues for arm swing and size of steps when presented with increased distractions.              OT Education - 06/22/17 1007    Education provided  Yes    Education Details  alternating sides during exercises to increase complexity    Person(s) Educated  Patient    Methods  Explanation;Demonstration;Verbal cues    Comprehension  Returned demonstration;Verbalized understanding;Verbal cues required          OT Long Term Goals - 06/19/17 1939      OT LONG TERM GOAL #1   Title  Patient will complete HEP for maximal daily exercises with modified independence in 4 weeks     Baseline  no current program     Time  4    Period  Weeks    Status  On-going       OT LONG TERM GOAL #2   Title  Patient will improve gait speed and endurance and be able to walk 875 feet in 6 minutes to negotiate around the home and community safely in 4 weeks.     Time  4    Period  Weeks    Status  On-going      OT LONG TERM GOAL #3   Title   Patient will perform sit to stand from lower and unstable surfaces with modified independence.      Baseline  difficulty with transitioning from sit to stand especially from lower surfaces.     Time  4    Period  Weeks    Status  On-going      OT LONG TERM GOAL #4   Title  Patient will demonstrate performance of basic self care tasks with modified independence efficiently.    Baseline  increased time to complete tasks, occasional difficulty with putting pants on.     Time  4    Period  Weeks    Status  On-going      OT LONG TERM GOAL #5   Title  Patient will complete handwriting with name and address with 75% legibility.    Baseline  25% legibility at eval     Time  4    Period  Weeks    Status  On-going            Plan - 06/22/17 1015    Clinical Impression Statement  Patient working towards increasing complexity of maximal daily exercises with alternating sides when performing exercises.  He reports some difficulty at home last night and has not gotten used to new pattern of movement with switiching sides.  Advised patient to default to standard form of exercises in the future if confused at home.  Patient preparing for discharge at the end of this week and will plan to continue with maximal daily exercises a minimum of one time a day for 10 repetitions.    Occupational Profile and client history currently impacting functional performance  Parkinson's disease, tremors in B hands, decreased memory, history of CVA    Occupational performance deficits (Please refer to evaluation for details):  ADL's;IADL's;Social Participation;Leisure    Rehab Potential  Good    Current Impairments/barriers affecting progress:   memory deficits, past CVA    OT Frequency  4x / week    OT Duration  4 weeks    OT Treatment/Interventions  Self-care/ADL training;Patient/family education;Gait Training;Balance training;Stair Training;Functional Furniture conservator/restorer;Therapeutic exercise;Manual Therapy;Therapeutic activities;Neuromuscular education  Consulted and Agree with Plan of Care  Patient       Patient will benefit from skilled therapeutic intervention in order to improve the following deficits and impairments:  Abnormal gait, Decreased endurance, Decreased strength, Decreased balance, Difficulty walking, Decreased mobility, Decreased cognition, Decreased coordination  Visit Diagnosis: Difficulty in walking, not elsewhere classified  Other lack of coordination  Muscle weakness (generalized)  Unsteadiness on feet    Problem List Patient Active Problem List   Diagnosis Date Noted  . Seizures ([place])   . Expressive aphasia   . CVA (cerebral vascular accident) [facility]) 05/05/2016   Yobani Schertzer T Tomasita Morrow, OTR/L, CLT  Jemya Depierro 06/22/2017, 10:32 AM  [place] MAIN [facility] SERVICES [address] [place], [place], [address] Phone: [phone]   Fax:  [phone]  Name: SHINE MIKES Sr. MRN: [id number] Date of Birth: 12/13/1940

## 2017-06-23 ENCOUNTER — Encounter: Payer: Self-pay | Admitting: Occupational Therapy

## 2017-06-23 ENCOUNTER — Ambulatory Visit: Payer: Non-veteran care | Admitting: Occupational Therapy

## 2017-06-23 DIAGNOSIS — R262 Difficulty in walking, not elsewhere classified: Secondary | ICD-10-CM | POA: Diagnosis not present

## 2017-06-23 DIAGNOSIS — R278 Other lack of coordination: Secondary | ICD-10-CM

## 2017-06-23 DIAGNOSIS — R2681 Unsteadiness on feet: Secondary | ICD-10-CM

## 2017-06-23 DIAGNOSIS — M6281 Muscle weakness (generalized): Secondary | ICD-10-CM

## 2017-06-23 NOTE — Therapy (Signed)
[place] MAIN [facility] SERVICES [address] [place], [place], [address] Phone: [phone]   Fax:  [phone]  Occupational Therapy Treatment  Patient Details  Name: Gilbert LEDESMA Sr. MRN: [id number] Date of Birth: Aug 30, 1940 Referring Provider: Joselyn Arrow   Encounter Date: 06/22/2017  OT End of Session - 06/22/17 1707    Visit Number  16    Number of Visits  17    Date for OT Re-Evaluation  07/01/17    OT Start Time  1000    OT Stop Time  1058    OT Time Calculation (min)  58 min    Activity Tolerance  Patient tolerated treatment well    Behavior During Therapy  [facility] for tasks assessed/performed       Past Medical History:  Diagnosis Date  . Diabetes mellitus without complication ([place])   . Hypertension     Past Surgical History:  Procedure Laterality Date  . NO PAST SURGERIES      There were no vitals filed for this visit.  Subjective Assessment - 06/22/17 1705    Subjective   Patient reports he did not feel well today, checked his blood sugar before coming and it was 94, blood pressure was normal.  Reports he feels a bit dizzy at times and vision is a bit blurred, he states he gets this way at times and often it is when his blood sugar drops.     Pertinent History  Patient was diagnosed with Parkinson's disease about 1 year ago, he also had a CVA in Jan 2018.  He reports since his stroke he has issues at times with memory, often gets in the car to go somewhere he is familiar with and has to pull off, think about the route he needs to take and then is able to get there.  He reports tremors in bilateral UEs with left worse than right.  He reports recent issues with transfers, walking and mild issues with self care tasks.     Patient Stated Goals  "I want to be normal"    Currently in Pain?  No/denies    Pain Score  0-No pain                   OT Treatments/Exercises (OP) - 06/23/17 1824      ADLs   ADL Comments   Patient seen for sit to stand from a variety of surfaces with supervision, reciprocal stepping patterns with SBA, stair negotiation with SBA and cues for turns, handwriting for names of kids, grandkids and great grand kids, reaching down to feet to put on socks and shoes.       Neurological Re-education Exercises   Other Exercises 1  Patient seen for instruction of LSVT BIG exercises: LSVT Daily Session Maximal Daily Exercises: Sustained movements are designed to rescale the amplitude of movement output for generalization to daily functional activities. Performed as follows for 1 set of 10 repetitions each: Multi directional sustained movements- 1) Floor to ceiling, 2) Side to side. Multi directional Repetitive movements performed in standing and are designed to provide retraining effort needed for sustained muscle activation in tasks Performed as follows: 3) Step and reach forward, 4) Step and Reach Backwards, 5) Step and reach sideways, 6) Rock and reach forward/backward, 7) Rock and reach sideways. Requires SBA  for all exercises in standing.  Increased complexity to exercises this date by alternating sides with exercises for all except floor to ceiling and  rock and reach.  Patient able to demonstrate with cues and therapist demo, SBA for balance.    Other Exercises 2  Functional mobility skills for 2 trials of 300 feet with one short rest break with cues for reciprocal arm swing.              OT Education - 06/22/17 1706    Education provided  Yes    Education Details  maximal daily exercises with alternating sides.    Person(s) Educated  Patient    Methods  Explanation;Demonstration;Verbal cues    Comprehension  Verbal cues required;Returned demonstration;Verbalized understanding          OT Long Term Goals - 06/19/17 1939      OT LONG TERM GOAL #1   Title  Patient will complete HEP for maximal daily exercises with modified independence in 4 weeks     Baseline  no current program      Time  4    Period  Weeks    Status  On-going      OT LONG TERM GOAL #2   Title  Patient will improve gait speed and endurance and be able to walk 875 feet in 6 minutes to negotiate around the home and community safely in 4 weeks.     Time  4    Period  Weeks    Status  On-going      OT LONG TERM GOAL #3   Title   Patient will perform sit to stand from lower and unstable surfaces with modified independence.      Baseline  difficulty with transitioning from sit to stand especially from lower surfaces.     Time  4    Period  Weeks    Status  On-going      OT LONG TERM GOAL #4   Title  Patient will demonstrate performance of basic self care tasks with modified independence efficiently.    Baseline  increased time to complete tasks, occasional difficulty with putting pants on.     Time  4    Period  Weeks    Status  On-going      OT LONG TERM GOAL #5   Title  Patient will complete handwriting with name and address with 75% legibility.    Baseline  25% legibility at eval     Time  4    Period  Weeks    Status  On-going            Plan - 06/22/17 1707    Clinical Impression Statement  Patient not feeling well today and required increased rest breaks and modifications.  Patient checked blood sugar which was normal and BP normal.  He was able to complete session but was slower to complete exercises and mobiility.  Will continue to plan for discharge next date.     Occupational Profile and client history currently impacting functional performance  Parkinson's disease, tremors in B hands, decreased memory, history of CVA    Occupational performance deficits (Please refer to evaluation for details):  ADL's;IADL's;Social Participation;Leisure    Rehab Potential  Good    Current Impairments/barriers affecting progress:  memory deficits, past CVA    OT Frequency  4x / week    OT Duration  4 weeks    OT Treatment/Interventions  Self-care/ADL training;Patient/family education;Gait  Training;Balance training;Stair Training;Functional Furniture conservator/restorer;Therapeutic exercise;Manual Therapy;Therapeutic activities;Neuromuscular education    Consulted and Agree with Plan of Care  Patient  Patient will benefit from skilled therapeutic intervention in order to improve the following deficits and impairments:  Abnormal gait, Decreased endurance, Decreased strength, Decreased balance, Difficulty walking, Decreased mobility, Decreased cognition, Decreased coordination  Visit Diagnosis: Difficulty in walking, not elsewhere classified  Other lack of coordination  Muscle weakness (generalized)  Unsteadiness on feet    Problem List Patient Active Problem List   Diagnosis Date Noted  . Seizures ([place])   . Expressive aphasia   . CVA (cerebral vascular accident) [facility]) 05/05/2016   Lazarus [place] T Tomasita Morrow, OTR/L, CLT  Shafiq Larch 06/23/2017, 6:27 PM  [place] MAIN [facility] SERVICES [address] [place], [place], [address] Phone: [phone]   Fax:  [phone]  Name: Gilbert SHILLINGBURG Sr. MRN: [id number] Date of Birth: 03-09-1941

## 2017-06-23 NOTE — Therapy (Signed)
[place] MAIN [facility] SERVICES [address] [place], [place], [address] Phone: [phone]   Fax:  [phone]  Occupational Therapy Treatment/Discharge summary  Patient Details  Name: Gilbert ARRANTS Sr. MRN: [id number] Date of Birth: Apr 17, 1941 Referring Provider: Joselyn Arrow   Encounter Date: 06/23/2017  OT End of Session - 06/23/17 1852    Visit Number  17    Number of Visits  17    Date for OT Re-Evaluation  07/01/17    OT Start Time  1001    OT Stop Time  1055    OT Time Calculation (min)  54 min    Activity Tolerance  Patient tolerated treatment well    Behavior During Therapy  [facility] for tasks assessed/performed       Past Medical History:  Diagnosis Date  . Diabetes mellitus without complication ([place])   . Hypertension     Past Surgical History:  Procedure Laterality Date  . NO PAST SURGERIES      There were no vitals filed for this visit.  Subjective Assessment - 06/23/17 1851    Subjective   Patient reports he is feeling much better today, states, "I am really going to miss you, you helped me alot!"    Pertinent History  Patient was diagnosed with Parkinson's disease about 1 year ago, he also had a CVA in Jan 2018.  He reports since his stroke he has issues at times with memory, often gets in the car to go somewhere he is familiar with and has to pull off, think about the route he needs to take and then is able to get there.  He reports tremors in bilateral UEs with left worse than right.  He reports recent issues with transfers, walking and mild issues with self care tasks.     Patient Stated Goals  "I want to be normal"    Currently in Pain?  No/denies    Pain Score  0-No pain    Multiple Pain Sites  No                   OT Treatments/Exercises (OP) - 06/23/17 1922      ADLs   ADL Comments  Patient seen for sit to stand from a variety of surfaces with supervision, reciprocal stepping patterns with SBA, stair  negotiation with SBA and cues for turns, handwriting for names of kids, grandkids and great grand kids, reaching down to feet to put on socks and shoes.       Neurological Re-education Exercises   Other Exercises 1  Patient seen for instruction of LSVT BIG exercises: LSVT Daily Session Maximal Daily Exercises: Sustained movements are designed to rescale the amplitude of movement output for generalization to daily functional activities. Performed as follows for 1 set of 10 repetitions each: Multi directional sustained movements- 1) Floor to ceiling, 2) Side to side. Multi directional Repetitive movements performed in standing and are designed to provide retraining effort needed for sustained muscle activation in tasks Performed as follows: 3) Step and reach forward, 4) Step and Reach Backwards, 5) Step and reach sideways, 6) Rock and reach forward/backward, 7) Rock and reach sideways. Requires SBA  for all exercises in standing.  Increased complexity to exercises this date by alternating sides with exercises for all except floor to ceiling and rock and reach.  Able to complete with minimal cues for proper technique.     Other Exercises 2  Patient seen for final outcome  measures with 6 minute walk test 1255 feet, 5 times sit to stand 8 secs. and BERG balance test 50/56             OT Education - 06/23/17 1852    Education provided  Yes    Education Details  forever HEP    Person(s) Educated  Patient    Methods  Explanation;Demonstration;Verbal cues    Comprehension  Verbal cues required;Returned demonstration;Verbalized understanding          OT Long Term Goals - 06/23/17 1927      OT LONG TERM GOAL #1   Title  Patient will complete HEP for maximal daily exercises with modified independence in 4 weeks     Baseline  no current program     Time  4    Period  Weeks    Status  Achieved      OT LONG TERM GOAL #2   Title  Patient will improve gait speed and endurance and be able to walk  875 feet in 6 minutes to negotiate around the home and community safely in 4 weeks.     Time  4    Period  Weeks    Status  Achieved      OT LONG TERM GOAL #3   Title   Patient will perform sit to stand from lower and unstable surfaces with modified independence.      Baseline  difficulty with transitioning from sit to stand especially from lower surfaces.     Time  4    Period  Weeks    Status  Achieved      OT LONG TERM GOAL #4   Title  Patient will demonstrate performance of basic self care tasks with modified independence efficiently.    Baseline  increased time to complete tasks, occasional difficulty with putting pants on.     Time  4    Period  Weeks    Status  Achieved      OT LONG TERM GOAL #5   Title  Patient will complete handwriting with name and address with 75% legibility.    Baseline  25% legibility at eval     Time  4    Period  Weeks    Status  Achieved            Plan - 06/23/17 1853    Clinical Impression Statement  Patient completed LSVT BIG program and has met goals, he has made excellent progress with functional mobility evidenced with 6 minute walk test, balance improved significantly with BERG balance score now of 50/56.  Recommend patient continue to perform exercises daily at a minimum of one time a day.  He reports he feels much better, wants to get back into his woodworking, feels stronger and moving well.  Patient pleased with progress and ready for discharge.     Occupational Profile and client history currently impacting functional performance  Parkinson's disease, tremors in B hands, decreased memory, history of CVA    Occupational performance deficits (Please refer to evaluation for details):  ADL's;IADL's;Social Participation;Leisure    Rehab Potential  Good    Current Impairments/barriers affecting progress:  memory deficits, past CVA    OT Frequency  4x / week    OT Duration  4 weeks    OT Treatment/Interventions  Self-care/ADL  training;Patient/family education;Gait Training;Balance training;Stair Training;Functional Furniture conservator/restorer;Therapeutic exercise;Manual Therapy;Therapeutic activities;Neuromuscular education    Consulted and Agree with Plan of Care  Patient  Patient will benefit from skilled therapeutic intervention in order to improve the following deficits and impairments:  Abnormal gait, Decreased endurance, Decreased strength, Decreased balance, Difficulty walking, Decreased mobility, Decreased cognition, Decreased coordination  Visit Diagnosis: Difficulty in walking, not elsewhere classified  Other lack of coordination  Muscle weakness (generalized)  Unsteadiness on feet    Problem List Patient Active Problem List   Diagnosis Date Noted  . Seizures ([place])   . Expressive aphasia   . CVA (cerebral vascular accident) [facility]) 05/05/2016   Amy T Tomasita Morrow, OTR/L, CLT  Lovett,Amy 06/23/2017, 7:28 PM  [place] MAIN [facility] SERVICES [address] [place], [place], [address] Phone: [phone]   Fax:  [phone]  Name: Gilbert GUERRETTE Sr. MRN: [id number] Date of Birth: July 21, 1940

## 2017-06-24 ENCOUNTER — Ambulatory Visit: Payer: Non-veteran care | Admitting: Occupational Therapy

## 2017-06-27 DIAGNOSIS — E118 Type 2 diabetes mellitus with unspecified complications: Secondary | ICD-10-CM | POA: Diagnosis not present

## 2017-06-27 DIAGNOSIS — N183 Chronic kidney disease, stage 3 (moderate): Secondary | ICD-10-CM | POA: Diagnosis not present

## 2017-06-27 DIAGNOSIS — Z0000 Encounter for general adult medical examination without abnormal findings: Secondary | ICD-10-CM | POA: Diagnosis not present

## 2017-06-27 DIAGNOSIS — Z794 Long term (current) use of insulin: Secondary | ICD-10-CM | POA: Diagnosis not present

## 2017-06-27 DIAGNOSIS — E039 Hypothyroidism, unspecified: Secondary | ICD-10-CM | POA: Diagnosis not present

## 2017-06-27 DIAGNOSIS — G40909 Epilepsy, unspecified, not intractable, without status epilepticus: Secondary | ICD-10-CM | POA: Diagnosis not present

## 2017-07-19 ENCOUNTER — Encounter: Payer: No Typology Code available for payment source | Admitting: Occupational Therapy

## 2017-07-29 DIAGNOSIS — G40909 Epilepsy, unspecified, not intractable, without status epilepticus: Secondary | ICD-10-CM | POA: Diagnosis not present

## 2017-09-13 DIAGNOSIS — Z961 Presence of intraocular lens: Secondary | ICD-10-CM | POA: Diagnosis not present

## 2017-09-13 DIAGNOSIS — Z9849 Cataract extraction status, unspecified eye: Secondary | ICD-10-CM | POA: Diagnosis not present

## 2017-09-13 DIAGNOSIS — H5202 Hypermetropia, left eye: Secondary | ICD-10-CM | POA: Diagnosis not present

## 2017-09-13 DIAGNOSIS — E119 Type 2 diabetes mellitus without complications: Secondary | ICD-10-CM | POA: Diagnosis not present

## 2017-09-13 DIAGNOSIS — H43813 Vitreous degeneration, bilateral: Secondary | ICD-10-CM | POA: Diagnosis not present

## 2017-09-13 DIAGNOSIS — H52223 Regular astigmatism, bilateral: Secondary | ICD-10-CM | POA: Diagnosis not present

## 2017-09-13 DIAGNOSIS — I10 Essential (primary) hypertension: Secondary | ICD-10-CM | POA: Diagnosis not present

## 2017-11-30 ENCOUNTER — Other Ambulatory Visit: Payer: Self-pay | Admitting: *Deleted

## 2017-11-30 DIAGNOSIS — I10 Essential (primary) hypertension: Secondary | ICD-10-CM

## 2017-11-30 NOTE — Patient Outreach (Addendum)
11/28/17-Telephone call for follow up on [place] completed, spoke with patient, HIPAA verified, pt reports he sees primary care MD at [facility] Dr. Jeannetta Nap and Dr. Sabra Heck in [place].  Pt reports he takes po medications for DM with CBG readings 100-120's range, pt is pleased with readings and has no concerns related to DM, pt reports hypertension is well controlled, had CVA 1.5 years ago and " no issues since"  Pt reports he would like information on advanced directives mailed to his home.  RN CM placed order for BSW to mail advanced directives packet/ EMMI education.  RN CM mailed successful outreach letter to pt home.  Jacqlyn Larsen [facility], [place] Coordinator [phone]

## 2017-12-02 ENCOUNTER — Other Ambulatory Visit: Payer: Self-pay

## 2017-12-02 NOTE — Patient Outreach (Signed)
[place] [facility]) Care Management  12/02/2017  Gilbert Wescoat Benner Sr. Jan 16, 1941 [id number]  Successful call to the patient on today's date, HIPAA identifiers confirmed. BSW introduced self and the reason for today's call, indicating BSW had received a referral the patient was interested in [place]. The patient is agreeable to BSW mailing the patient an Advanced Directive packet. BSW will plan to contact the patient in the next two weeks to confirm receipt of mailing and assist with completion.  Daneen Schick, BSW, CDP [facility] [phone]

## 2017-12-16 ENCOUNTER — Other Ambulatory Visit: Payer: Self-pay

## 2017-12-16 IMAGING — CT CT HEAD W/O CM
4 of 7 series · 16 of 47 positions shown, 18 images · non-contrast
Comparison: Head CT 05/05/2016 and MRI 05/06/2016

CLINICAL DATA: Altered mental status. Left temporal slowing on EEG.

EXAM:
CT HEAD WITHOUT CONTRAST
TECHNIQUE: Contiguous axial images were obtained from the base of the skull
through the vertex without intravenous contrast.

[Series 2: head wo · axial · 0.41mm/px · z∈[-142,-27]mm · 6 of 33 slices shown, 8 images (1 of 2)]
[im 5/33  brain]
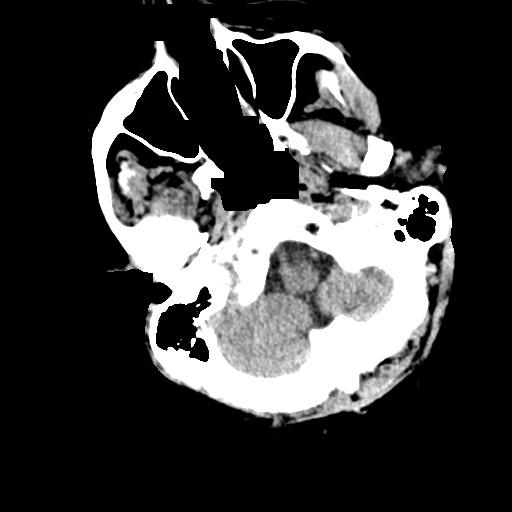
[im 5/33  bone]
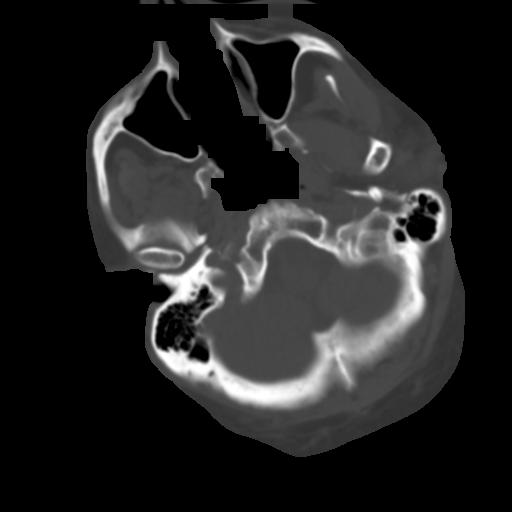
[im 10/33  brain]
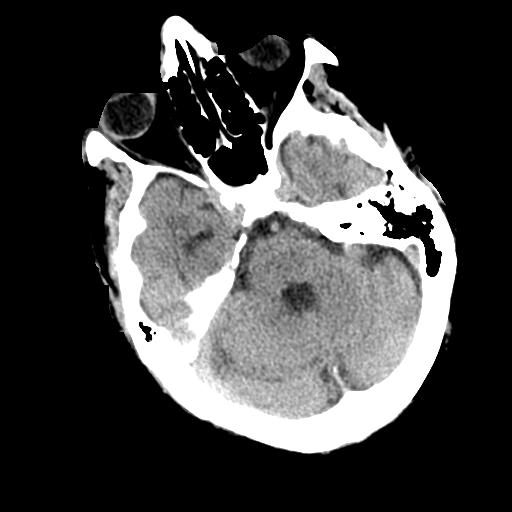
[im 14/33  brain]
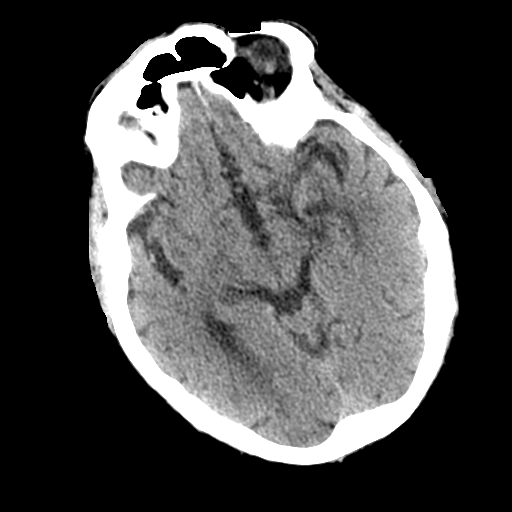
[im 19/33  brain]
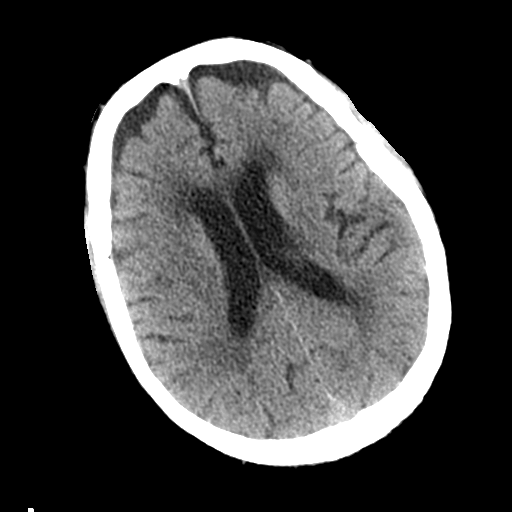
[im 23/33  brain]
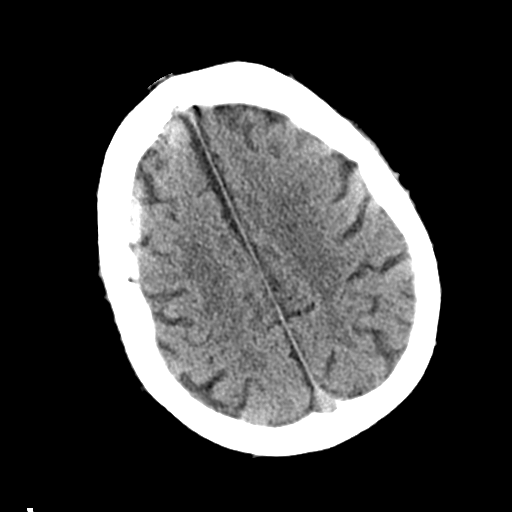
[im 23/33  bone]
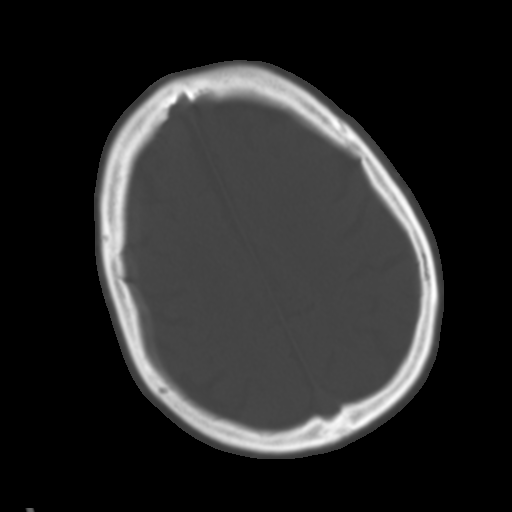
[im 28/33  brain]
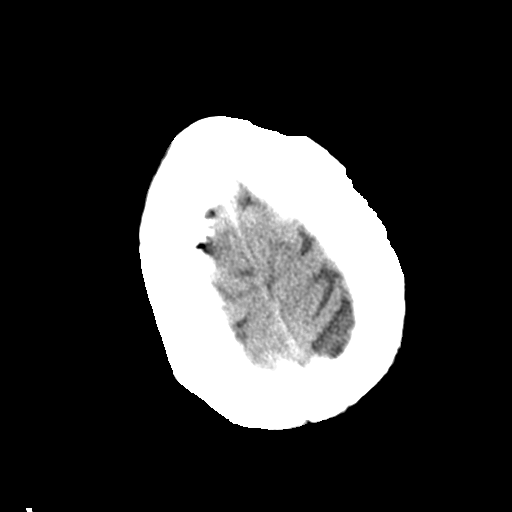

[Series 4: head wo · axial · 0.41mm/px · z∈[-109,-44]mm · 4 of 35 slices shown (2 of 2)]
[im 5/35  brain]
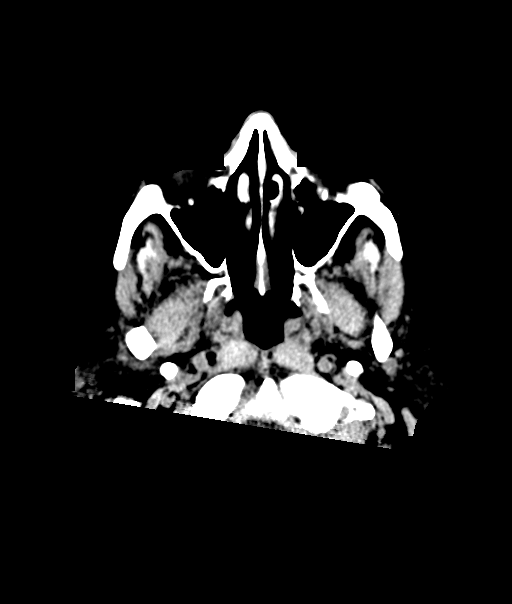
[im 10/35  brain]
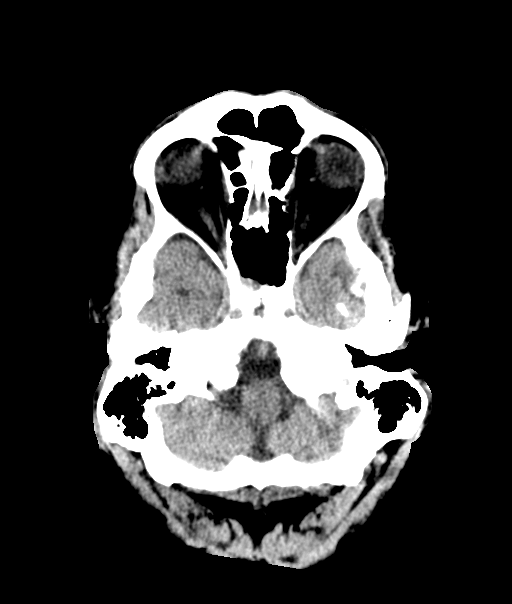
[im 15/35  brain]
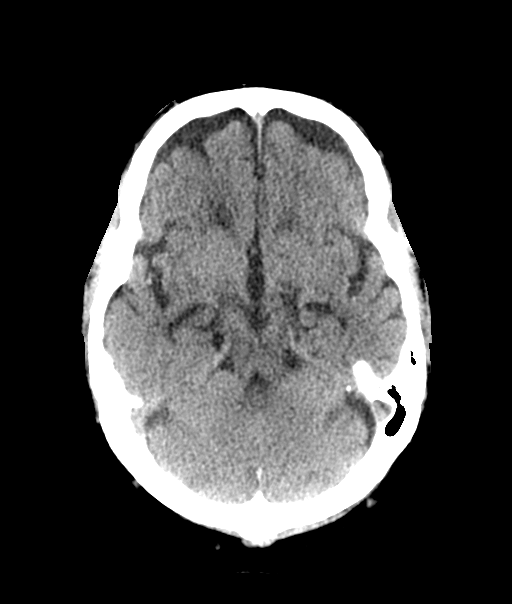
[im 20/35  brain]
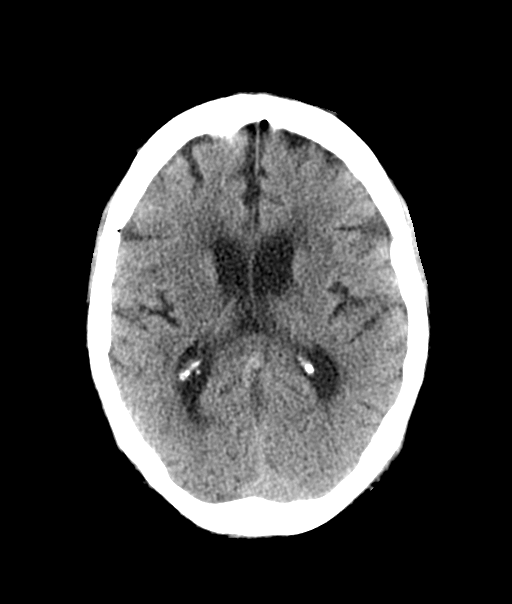

[Series 6: coronal soft tissue · coronal · 0.35mm/px · 3 of 67 slices shown]
[im 23/67  brain]
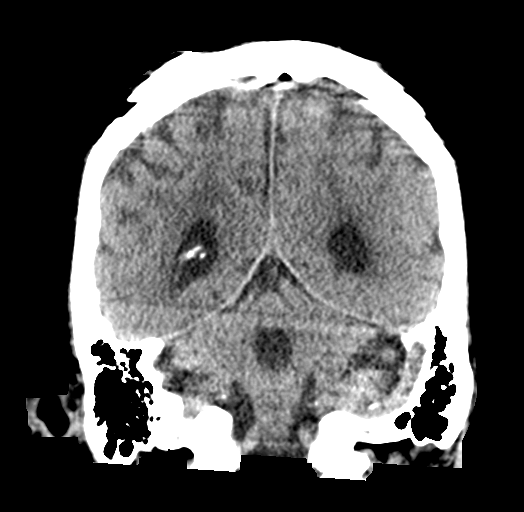
[im 30/67  brain]
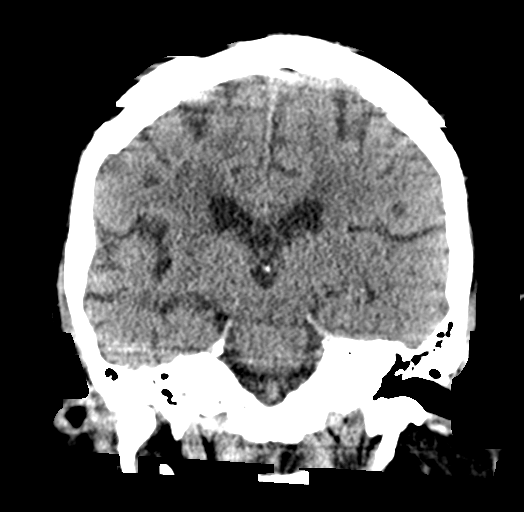
[im 37/67  brain]
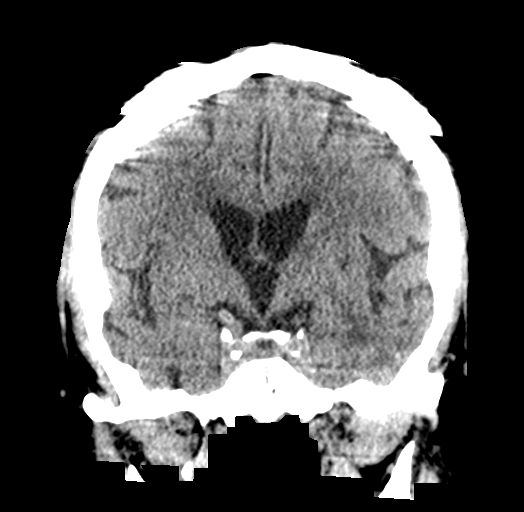

[Series 7: sagittal soft tissue · sagittal · 0.34mm/px · 3 of 52 slices shown]
[im 18/52  brain]
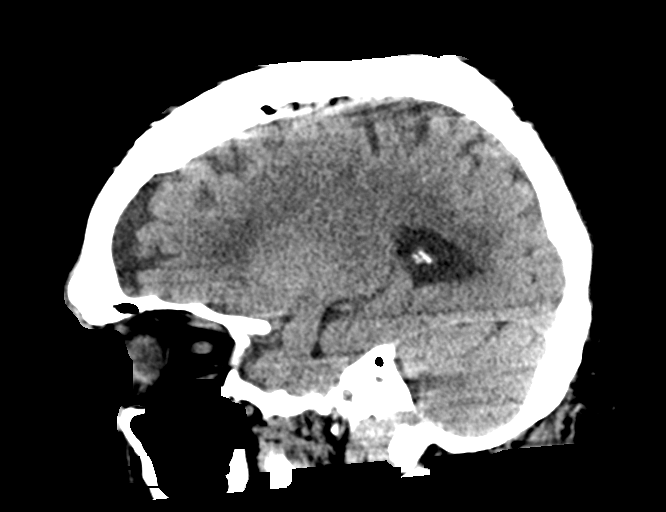
[im 26/52  brain]
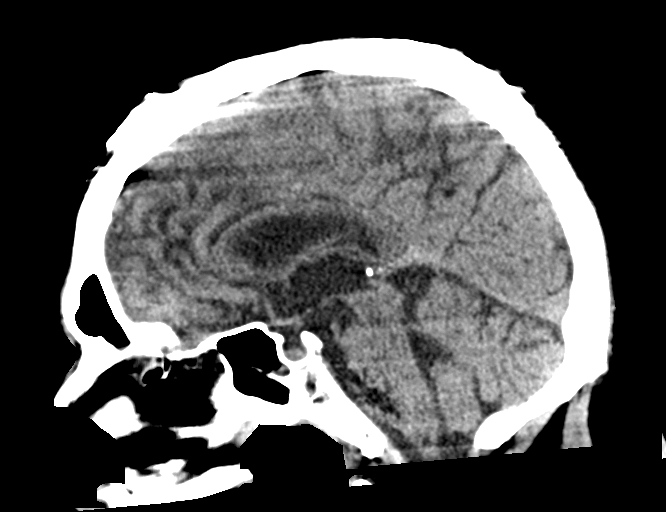
[im 35/52  brain]
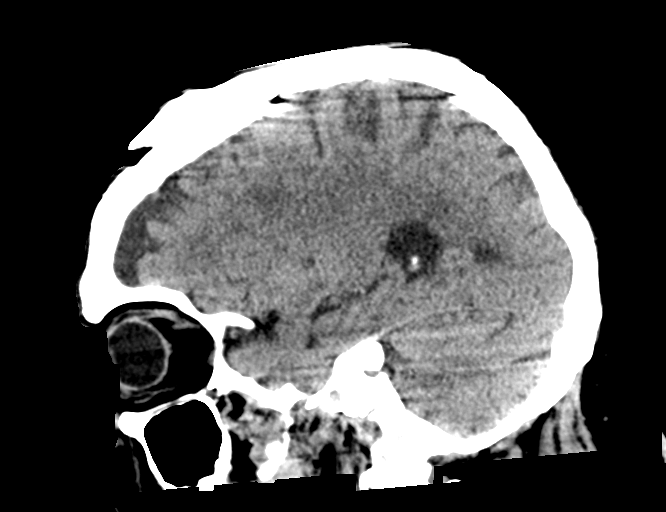

[16 of 47 positions shown; findings below may reference images not displayed]

FINDINGS: Brain: Chronic left basal ganglia lacunar infarct is again noted.
There is no evidence of acute cortically based infarct, intracranial
hemorrhage, mass, or midline shift. Mildly prominent extra-axial CSF
spaces overlying the frontal lobes are unchanged and more fully
evaluated on yesterday's MRI. There is mild cerebral atrophy. Patchy
cerebral white matter hypodensities are unchanged and nonspecific
but compatible with moderate chronic small vessel ischemic disease.

Vascular: Mild calcified atherosclerosis at the skullbase. No
hyperdense vessel.

Skull: No fracture or focal osseous lesion.

Sinuses/Orbits: Mild paranasal sinus mucosal thickening, greatest in
the right sphenoid sinus with small volume bubbly secretions. Clear
mastoid air cells. Prior bilateral cataract extraction.

Other: None.
IMPRESSION: 1. No evidence of acute intracranial abnormality.
2. Moderate chronic small vessel ischemic disease.

## 2017-12-16 NOTE — Patient Outreach (Signed)
[place] [facility]) Care Management  12/16/2017  Gilbert Schifano Hovsepian Sr. 1941-01-20 [id number]  Successful call to the patient on today's date, HIPAA identifiers confirmed. The patient denies receipt of Advance Directive in the mail. BSW to resend resource. BSW to follow up in the next month to confirm receipt.  Daneen Schick, BSW, CDP [facility] [phone]

## 2017-12-22 DIAGNOSIS — Z794 Long term (current) use of insulin: Secondary | ICD-10-CM | POA: Diagnosis not present

## 2017-12-22 DIAGNOSIS — E118 Type 2 diabetes mellitus with unspecified complications: Secondary | ICD-10-CM | POA: Diagnosis not present

## 2017-12-29 DIAGNOSIS — E039 Hypothyroidism, unspecified: Secondary | ICD-10-CM | POA: Diagnosis not present

## 2017-12-29 DIAGNOSIS — E1151 Type 2 diabetes mellitus with diabetic peripheral angiopathy without gangrene: Secondary | ICD-10-CM | POA: Diagnosis not present

## 2017-12-29 DIAGNOSIS — R972 Elevated prostate specific antigen [PSA]: Secondary | ICD-10-CM | POA: Diagnosis not present

## 2017-12-29 DIAGNOSIS — E782 Mixed hyperlipidemia: Secondary | ICD-10-CM | POA: Diagnosis not present

## 2018-01-12 ENCOUNTER — Other Ambulatory Visit: Payer: Self-pay

## 2018-01-12 NOTE — Patient Outreach (Signed)
[place] [facility]) Care Management  01/12/2018  Gilbert Daniello Asche Sr. 13-Jul-1940 [id number]  BSW placed a call to the patients home to confirm receipt of advance directives, spoke with the patients wife. Gilbert [name] confirms receipt of advance directives. Denies questions and declines BSW assistance in completing these forms. BSW briefly discussed the process of completing an advance directive including having the document notarized as well as providing copies to the appointed healthcare agent and physicians. Gilbert [name] stated understanding.  BSW to perform a case closure as no other needs have been identified at this time.   Daneen Schick, BSW, CDP [facility] [phone]

## 2018-01-26 DIAGNOSIS — R4701 Aphasia: Secondary | ICD-10-CM | POA: Diagnosis not present

## 2018-01-26 DIAGNOSIS — I6381 Other cerebral infarction due to occlusion or stenosis of small artery: Secondary | ICD-10-CM | POA: Diagnosis not present

## 2018-01-26 DIAGNOSIS — G20 Parkinson's disease: Secondary | ICD-10-CM | POA: Diagnosis not present

## 2018-01-26 DIAGNOSIS — N183 Chronic kidney disease, stage 3 (moderate): Secondary | ICD-10-CM | POA: Diagnosis not present

## 2018-01-26 DIAGNOSIS — G40909 Epilepsy, unspecified, not intractable, without status epilepticus: Secondary | ICD-10-CM | POA: Diagnosis not present

## 2018-02-28 DIAGNOSIS — H43813 Vitreous degeneration, bilateral: Secondary | ICD-10-CM | POA: Diagnosis not present

## 2018-02-28 DIAGNOSIS — H1132 Conjunctival hemorrhage, left eye: Secondary | ICD-10-CM | POA: Diagnosis not present

## 2018-02-28 DIAGNOSIS — E119 Type 2 diabetes mellitus without complications: Secondary | ICD-10-CM | POA: Diagnosis not present

## 2018-02-28 DIAGNOSIS — Z961 Presence of intraocular lens: Secondary | ICD-10-CM | POA: Diagnosis not present

## 2018-02-28 DIAGNOSIS — Z7984 Long term (current) use of oral hypoglycemic drugs: Secondary | ICD-10-CM | POA: Diagnosis not present

## 2018-06-22 DIAGNOSIS — E782 Mixed hyperlipidemia: Secondary | ICD-10-CM | POA: Diagnosis not present

## 2018-06-22 DIAGNOSIS — R972 Elevated prostate specific antigen [PSA]: Secondary | ICD-10-CM | POA: Diagnosis not present

## 2018-06-22 DIAGNOSIS — E039 Hypothyroidism, unspecified: Secondary | ICD-10-CM | POA: Diagnosis not present

## 2018-06-22 DIAGNOSIS — E1151 Type 2 diabetes mellitus with diabetic peripheral angiopathy without gangrene: Secondary | ICD-10-CM | POA: Diagnosis not present

## 2018-06-30 DIAGNOSIS — N183 Chronic kidney disease, stage 3 (moderate): Secondary | ICD-10-CM | POA: Diagnosis not present

## 2018-06-30 DIAGNOSIS — G40909 Epilepsy, unspecified, not intractable, without status epilepticus: Secondary | ICD-10-CM | POA: Diagnosis not present

## 2018-06-30 DIAGNOSIS — R972 Elevated prostate specific antigen [PSA]: Secondary | ICD-10-CM | POA: Diagnosis not present

## 2018-06-30 DIAGNOSIS — Z0000 Encounter for general adult medical examination without abnormal findings: Secondary | ICD-10-CM | POA: Diagnosis not present

## 2018-06-30 DIAGNOSIS — E1151 Type 2 diabetes mellitus with diabetic peripheral angiopathy without gangrene: Secondary | ICD-10-CM | POA: Diagnosis not present

## 2018-06-30 DIAGNOSIS — G20 Parkinson's disease: Secondary | ICD-10-CM | POA: Diagnosis not present

## 2018-07-25 DIAGNOSIS — M25531 Pain in right wrist: Secondary | ICD-10-CM | POA: Diagnosis not present

## 2018-07-25 DIAGNOSIS — G20 Parkinson's disease: Secondary | ICD-10-CM | POA: Diagnosis not present

## 2018-07-25 DIAGNOSIS — Z79899 Other long term (current) drug therapy: Secondary | ICD-10-CM | POA: Diagnosis not present

## 2018-07-25 DIAGNOSIS — I6381 Other cerebral infarction due to occlusion or stenosis of small artery: Secondary | ICD-10-CM | POA: Diagnosis not present

## 2018-07-25 DIAGNOSIS — R4189 Other symptoms and signs involving cognitive functions and awareness: Secondary | ICD-10-CM | POA: Diagnosis not present

## 2018-07-25 DIAGNOSIS — G40909 Epilepsy, unspecified, not intractable, without status epilepticus: Secondary | ICD-10-CM | POA: Diagnosis not present

## 2018-12-07 ENCOUNTER — Encounter: Payer: Self-pay | Admitting: Emergency Medicine

## 2018-12-07 ENCOUNTER — Emergency Department
Admission: EM | Admit: 2018-12-07 | Discharge: 2018-12-07 | Disposition: A | Discharge status: Home / Self Care | Payer: No Typology Code available for payment source | Attending: Emergency Medicine | Admitting: Emergency Medicine

## 2018-12-07 ENCOUNTER — Other Ambulatory Visit: Payer: Self-pay

## 2018-12-07 DIAGNOSIS — Z7982 Long term (current) use of aspirin: Secondary | ICD-10-CM | POA: Insufficient documentation

## 2018-12-07 DIAGNOSIS — R1084 Generalized abdominal pain: Secondary | ICD-10-CM

## 2018-12-07 DIAGNOSIS — G20 Parkinson's disease: Secondary | ICD-10-CM | POA: Diagnosis not present

## 2018-12-07 DIAGNOSIS — R197 Diarrhea, unspecified: Secondary | ICD-10-CM | POA: Insufficient documentation

## 2018-12-07 DIAGNOSIS — A09 Infectious gastroenteritis and colitis, unspecified: Secondary | ICD-10-CM | POA: Diagnosis not present

## 2018-12-07 DIAGNOSIS — E119 Type 2 diabetes mellitus without complications: Secondary | ICD-10-CM | POA: Insufficient documentation

## 2018-12-07 DIAGNOSIS — Z79899 Other long term (current) drug therapy: Secondary | ICD-10-CM | POA: Diagnosis not present

## 2018-12-07 DIAGNOSIS — I10 Essential (primary) hypertension: Secondary | ICD-10-CM | POA: Diagnosis not present

## 2018-12-07 DIAGNOSIS — Z794 Long term (current) use of insulin: Secondary | ICD-10-CM | POA: Insufficient documentation

## 2018-12-07 DIAGNOSIS — R110 Nausea: Secondary | ICD-10-CM | POA: Diagnosis not present

## 2018-12-07 HISTORY — DX: Parkinson's disease without dyskinesia, without mention of fluctuations: G20.A1

## 2018-12-07 HISTORY — DX: Parkinson's disease: G20

## 2018-12-07 LAB — URINALYSIS, COMPLETE (UACMP) WITH MICROSCOPIC
Bacteria, UA: NONE SEEN
Bilirubin Urine: NEGATIVE
Glucose, UA: >=500 mg/dL — AB
Ketones, ur: 5 mg/dL — AB
Leukocytes,Ua: NEGATIVE
Nitrite: NEGATIVE
Protein, ur: NEGATIVE mg/dL
Specific Gravity, Urine: 1.011 (ref 1.005–1.030)
Squamous Epithelial / LPF: NONE SEEN (ref 0–5)
pH: 6 (ref 5.0–8.0)

## 2018-12-07 LAB — COMPREHENSIVE METABOLIC PANEL
ALT: 9 U/L (ref 0–44)
AST: 19 U/L (ref 15–41)
Albumin: 4 g/dL (ref 3.5–5.0)
Alkaline Phosphatase: 107 U/L (ref 38–126)
Anion gap: 11 (ref 5–15)
BUN: 27 mg/dL — ABNORMAL HIGH (ref 8–23)
CO2: 22 mmol/L (ref 22–32)
Calcium: 8.6 mg/dL — ABNORMAL LOW (ref 8.9–10.3)
Chloride: 102 mmol/L (ref 98–111)
Creatinine, Ser: 1.65 mg/dL — ABNORMAL HIGH (ref 0.61–1.24)
GFR calc Af Amer: 45 mL/min — ABNORMAL LOW (ref >60)
GFR calc non Af Amer: 39 mL/min — ABNORMAL LOW (ref >60)
Glucose, Bld: 245 mg/dL — ABNORMAL HIGH (ref 70–99)
Potassium: 3.7 mmol/L (ref 3.5–5.1)
Sodium: 135 mmol/L (ref 135–145)
Total Bilirubin: 0.6 mg/dL (ref 0.3–1.2)
Total Protein: 8 g/dL (ref 6.5–8.1)

## 2018-12-07 LAB — CBC
HCT: 42.6 % (ref 39.0–52.0)
Hemoglobin: 13.8 g/dL (ref 13.0–17.0)
MCH: 28.5 pg (ref 26.0–34.0)
MCHC: 32.4 g/dL (ref 30.0–36.0)
MCV: 87.8 fL (ref 80.0–100.0)
Platelets: 201 10*3/uL (ref 150–400)
RBC: 4.85 MIL/uL (ref 4.22–5.81)
RDW: 13.9 % (ref 11.5–15.5)
WBC: 12.5 10*3/uL — ABNORMAL HIGH (ref 4.0–10.5)
nRBC: 0 % (ref 0.0–0.2)

## 2018-12-07 LAB — TROPONIN I (HIGH SENSITIVITY): Troponin I (High Sensitivity): 17 ng/L (ref <18)

## 2018-12-07 LAB — LIPASE, BLOOD: Lipase: 44 U/L (ref 11–51)

## 2018-12-07 MED ORDER — HYDRALAZINE HCL 50 MG PO TABS
25.0000 mg | ORAL_TABLET | Freq: Once | ORAL | Status: AC
  Administered 2018-12-07: 25 mg via ORAL
  Filled 2018-12-07: qty 1

## 2018-12-07 MED ORDER — AMLODIPINE BESYLATE 5 MG PO TABS
10.0000 mg | ORAL_TABLET | Freq: Once | ORAL | Status: AC
  Administered 2018-12-07: 10 mg via ORAL
  Filled 2018-12-07: qty 2

## 2018-12-07 MED ORDER — AMLODIPINE BESYLATE 5 MG PO TABS: 10.0000 mg | ORAL_TABLET | Freq: Once | ORAL | Status: DC

## 2018-12-07 MED ORDER — ATENOLOL 25 MG PO TABS
25.0000 mg | ORAL_TABLET | Freq: Once | ORAL | Status: AC
  Administered 2018-12-07: 25 mg via ORAL
  Filled 2018-12-07: qty 1

## 2018-12-07 MED ORDER — ONDANSETRON HCL 4 MG/2ML IJ SOLN
4.0000 mg | Freq: Once | INTRAMUSCULAR | Status: AC
  Administered 2018-12-07: 4 mg via INTRAVENOUS
  Filled 2018-12-07: qty 2

## 2018-12-07 MED ORDER — SODIUM CHLORIDE 0.9 % IV BOLUS
1000.0000 mL | Freq: Once | INTRAVENOUS | Status: AC
  Administered 2018-12-07: 1000 mL via INTRAVENOUS

## 2018-12-07 MED ORDER — ACETAMINOPHEN 500 MG PO TABS
1000.0000 mg | ORAL_TABLET | Freq: Once | ORAL | Status: AC
  Administered 2018-12-07: 1000 mg via ORAL
  Filled 2018-12-07: qty 2

## 2018-12-07 NOTE — ED Triage Notes (Signed)
Pt presents to ED with generalized abd pain "that just hurts" with nausea and diarrhea X4. Pt states he thinks he has food poisoning from a sandwich he ate around 1630. Onset of symptoms was around 2030.

## 2018-12-07 NOTE — ED Notes (Signed)
ED Provider at bedside. 

## 2018-12-07 NOTE — ED Notes (Signed)
Patient ambulatory to lobby with steady gait and NAD noted. Verbalized understanding of discharge instructions and follow-up care.  

## 2018-12-07 NOTE — ED Provider Notes (Signed)
[facility] Emergency Department Provider Note  ____________________________________________  Time seen: Approximately 5:22 AM  I have reviewed the triage vital signs and the nursing notes.   HISTORY  Chief Complaint Nausea, Abdominal Pain, and Diarrhea   HPI Gilbert VANACKER Sr. is a 78 y.o. male with a history of diabetes, hypertension, Parkinson's disease, CVA who presents for evaluation of abdominal pain and diarrhea.  Patient reports that he ate a sandwich that his wife brought home around 4:30 PM.  4 hours later he started having generalized abdominal pain that he describes as dull and severe.  Had 4 episodes of watery diarrhea.  The pain now is 1 out of 10.  No fever or chills.  He has had nausea but no vomiting.  No melena, hematochezia, hematemesis, chest pain, shortness of breath, dysuria or hematuria.  No prior abdominal surgeries.  No recent antibiotic use or history of C. difficile.   Past Medical History:  Diagnosis Date  . Diabetes mellitus without complication ([place])   . Hypertension   . Parkinson disease [facility])     Patient Active Problem List   Diagnosis Date Noted  . Seizures ([place])   . Expressive aphasia   . CVA (cerebral vascular accident) ([place]) 05/05/2016    Past Surgical History:  Procedure Laterality Date  . ELBOW SURGERY    . NO PAST SURGERIES      Prior to Admission medications   Medication Sig Start Date End Date Taking? Authorizing Provider  amLODipine (NORVASC) 10 MG tablet Take 1 tablet (10 mg total) by mouth daily. 05/10/16   Gladstone Lighter, MD  aspirin EC 81 MG EC tablet Take 1 tablet (81 mg total) by mouth daily. 05/10/16   Gladstone Lighter, MD  atenolol (TENORMIN) 25 MG tablet Take 1 tablet (25 mg total) by mouth daily. 05/10/16   Gladstone Lighter, MD  atorvastatin (LIPITOR) 40 MG tablet Take 1 tablet (40 mg total) by mouth daily at 6 PM. Patient not taking: Reported on 05/26/2017 05/10/16   Gladstone Lighter, MD   clopidogrel (PLAVIX) 75 MG tablet Take 1 tablet (75 mg total) by mouth daily. 05/10/16   Gladstone Lighter, MD  divalproex (DEPAKOTE) 500 MG DR tablet Take 1 tablet (500 mg total) by mouth 2 (two) times daily. 05/10/16   Gladstone Lighter, MD  insulin aspart protamine- aspart (NOVOLOG MIX 70/30) (70-30) 100 UNIT/ML injection Inject 30 Units into the skin 2 (two) times daily with a meal. 30 units subq morning and evening    [provider]  levothyroxine (SYNTHROID, LEVOTHROID) 75 MCG tablet Take 75 mcg by mouth daily before breakfast.    [provider]  lisinopril (PRINIVIL,ZESTRIL) 40 MG tablet Take 1 tablet (40 mg total) by mouth at bedtime. 05/10/16   Gladstone Lighter, MD    Allergies Patient has no known allergies.  Family History  Problem Relation Age of Onset  . Brain cancer Mother   . Prostate cancer Neg Hx   . Bladder Cancer Neg Hx   . Kidney cancer Neg Hx     Social History Social History   Tobacco Use  . Smoking status: Never Smoker  . Smokeless tobacco: Never Used  Substance Use Topics  . Alcohol use: No  . Drug use: No    Review of Systems  Constitutional: Negative for fever. Eyes: Negative for visual changes. ENT: Negative for sore throat. Neck: No neck pain  Cardiovascular: Negative for chest pain. Respiratory: Negative for shortness of breath. Gastrointestinal: + abdominal  pain, nausea, and diarrhea. No vomiting Genitourinary: Negative for dysuria. Musculoskeletal: Negative for back pain. Skin: Negative for rash. Neurological: Negative for headaches, weakness or numbness. Psych: No SI or HI  ____________________________________________   PHYSICAL EXAM:  VITAL SIGNS: ED Triage Vitals  Enc Vitals Group     BP 12/07/18 0434 (!) 191/83     Pulse Rate 12/07/18 0440 84     Resp 12/07/18 0440 19     Temp 12/07/18 0440 98.2 F (36.8 C)     Temp Source 12/07/18 0440 Oral     SpO2 12/07/18 0440 95 %     Weight 12/07/18 0252 190 lb  (86.2 kg)     Height 12/07/18 0252 5\' 11"  (1.803 m)     Head Circumference --      Peak Flow --      Pain Score 12/07/18 0252 4     Pain Loc --      Pain Edu? --      Excl. in [place]? --     Constitutional: Alert and oriented. Well appearing and in no apparent distress. HEENT:      Head: Normocephalic and atraumatic.         Eyes: Conjunctivae are normal. Sclera is non-icteric.       Mouth/Throat: Mucous membranes are moist.       Neck: Supple with no signs of meningismus. Cardiovascular: Regular rate and rhythm. No murmurs, gallops, or rubs. 2+ symmetrical distal pulses are present in all extremities. No JVD. Respiratory: Normal respiratory effort. Lungs are clear to auscultation bilaterally. No wheezes, crackles, or rhonchi.  Gastrointestinal: Soft, non tender, and non distended with positive bowel sounds. No rebound or guarding. Genitourinary: No CVA tenderness. Musculoskeletal: Nontender with normal range of motion in all extremities. No edema, cyanosis, or erythema of extremities. Neurologic: Normal speech and language. Face is symmetric. Moving all extremities. No gross focal neurologic deficits are appreciated. Skin: Skin is warm, dry and intact. No rash noted. Psychiatric: Mood and affect are normal. Speech and behavior are normal.  ____________________________________________   LABS (all labs ordered are listed, but only abnormal results are displayed)  Labs Reviewed  COMPREHENSIVE METABOLIC PANEL - Abnormal; Notable for the following components:      Result Value   Glucose, Bld 245 (*)    BUN 27 (*)    Creatinine, Ser 1.65 (*)    Calcium 8.6 (*)    GFR calc non Af Amer 39 (*)    GFR calc Af Amer 45 (*)    All other components within normal limits  CBC - Abnormal; Notable for the following components:   WBC 12.5 (*)    All other components within normal limits  URINALYSIS, COMPLETE (UACMP) WITH MICROSCOPIC - Abnormal; Notable for the following components:   Color,  Urine YELLOW (*)    APPearance CLEAR (*)    Glucose, UA >=500 (*)    Hgb urine dipstick SMALL (*)    Ketones, ur 5 (*)    All other components within normal limits  C DIFFICILE QUICK SCREEN W PCR REFLEX  LIPASE, BLOOD  TROPONIN I (HIGH SENSITIVITY)   ____________________________________________  EKG  ED ECG REPORT I, Rudene Re, the attending physician, personally viewed and interpreted this ECG.  Normal sinus rhythm, rate of 74, first-degree AV block, right bundle branch block, left anterior fascicular block, prolonged QTC, no ST elevations or depressions.  Unchanged from prior. ____________________________________________  RADIOLOGY  none  ____________________________________________   PROCEDURES  Procedure(s) performed: None  Procedures Critical Care performed:  None ____________________________________________   INITIAL IMPRESSION / ASSESSMENT AND PLAN / ED COURSE  78 y.o. male with a history of diabetes, hypertension, Parkinson's disease, CVA who presents for evaluation of abdominal pain and diarrhea for the last several hours.  Patient is extremely well-appearing and in no distress.  Slightly hypertensive but has not taken his morning meds yet.  Remainder of his vital signs are within normal limits.  His abdomen is soft with no tenderness, no rebound or guarding.  Positive bowel sounds.  Will check stool for C. difficile.  Differential diagnosis including food poisoning versus colitis versus enteritis versus gastroenteritis.  Labs showing mild leukocytosis, baseline creatinine, mild hyperglycemia with no evidence of DKA, normal electrolytes, normal LFTs and lipase. UA negative for UTI. EKG and trop with no evidence of ischemia. Will give tylenol for pain and zofran for nausea. Will give IVF and reassess    _________________________ 6:16 AM on 12/07/2018 -----------------------------------------  Patient's abdominal pain has fully resolved. No further episodes  of diarrhea. No N/V. Tolerating PO. Will dc home with f.u with PCP.  Discussed my return precautions for return of abdominal pain, nausea or vomiting, fever or chills.    As part of my medical decision making, I reviewed the following data within the [place] History obtained from family, Nursing notes reviewed and incorporated, Labs reviewed , EKG interpreted , Old EKG reviewed, Old chart reviewed, Notes from prior ED visits and [place] Controlled Substance Database   Patient was evaluated in Emergency Department today for the symptoms described in the history of present illness. Patient was evaluated in the context of the global COVID-19 pandemic, which necessitated consideration that the patient might be at risk for infection with the SARS-CoV-2 virus that causes COVID-19. Institutional protocols and algorithms that pertain to the evaluation of patients at risk for COVID-19 are in a state of rapid change based on information released by regulatory bodies including the CDC and federal and state organizations. These policies and algorithms were followed during the patient's care in the ED.   ____________________________________________   FINAL CLINICAL IMPRESSION(S) / ED DIAGNOSES   Final diagnoses:  Diarrhea of presumed infectious origin  Generalized abdominal pain      NEW MEDICATIONS STARTED DURING THIS VISIT:  ED Discharge Orders    None       Note:  This document was prepared using Dragon voice recognition software and may include unintentional dictation errors.    Alfred Levins, [place], MD 12/07/18 [phone]

## 2018-12-07 NOTE — Discharge Instructions (Signed)

## 2018-12-10 ENCOUNTER — Inpatient Hospital Stay
Admission: EM | Admit: 2018-12-10 | Discharge: 2018-12-13 | DRG: 866 | Disposition: A | Discharge status: Home Health | Payer: PPO | Attending: Internal Medicine | Admitting: Internal Medicine

## 2018-12-10 ENCOUNTER — Emergency Department: Payer: PPO

## 2018-12-10 ENCOUNTER — Other Ambulatory Visit: Payer: Self-pay

## 2018-12-10 DIAGNOSIS — K529 Noninfective gastroenteritis and colitis, unspecified: Secondary | ICD-10-CM | POA: Diagnosis present

## 2018-12-10 DIAGNOSIS — Z20828 Contact with and (suspected) exposure to other viral communicable diseases: Secondary | ICD-10-CM | POA: Diagnosis not present

## 2018-12-10 DIAGNOSIS — J849 Interstitial pulmonary disease, unspecified: Secondary | ICD-10-CM | POA: Diagnosis not present

## 2018-12-10 DIAGNOSIS — Z79899 Other long term (current) drug therapy: Secondary | ICD-10-CM | POA: Diagnosis not present

## 2018-12-10 DIAGNOSIS — Z79890 Hormone replacement therapy (postmenopausal): Secondary | ICD-10-CM | POA: Diagnosis not present

## 2018-12-10 DIAGNOSIS — E860 Dehydration: Secondary | ICD-10-CM | POA: Diagnosis present

## 2018-12-10 DIAGNOSIS — Z7902 Long term (current) use of antithrombotics/antiplatelets: Secondary | ICD-10-CM

## 2018-12-10 DIAGNOSIS — R4701 Aphasia: Secondary | ICD-10-CM | POA: Diagnosis not present

## 2018-12-10 DIAGNOSIS — I10 Essential (primary) hypertension: Secondary | ICD-10-CM | POA: Diagnosis present

## 2018-12-10 DIAGNOSIS — B349 Viral infection, unspecified: Principal | ICD-10-CM | POA: Diagnosis present

## 2018-12-10 DIAGNOSIS — E119 Type 2 diabetes mellitus without complications: Secondary | ICD-10-CM | POA: Diagnosis present

## 2018-12-10 DIAGNOSIS — R509 Fever, unspecified: Secondary | ICD-10-CM

## 2018-12-10 DIAGNOSIS — Z7982 Long term (current) use of aspirin: Secondary | ICD-10-CM | POA: Diagnosis not present

## 2018-12-10 DIAGNOSIS — G20 Parkinson's disease: Secondary | ICD-10-CM | POA: Diagnosis not present

## 2018-12-10 DIAGNOSIS — R531 Weakness: Secondary | ICD-10-CM

## 2018-12-10 DIAGNOSIS — Z794 Long term (current) use of insulin: Secondary | ICD-10-CM | POA: Diagnosis not present

## 2018-12-10 DIAGNOSIS — N179 Acute kidney failure, unspecified: Secondary | ICD-10-CM | POA: Diagnosis not present

## 2018-12-10 DIAGNOSIS — Z8673 Personal history of transient ischemic attack (TIA), and cerebral infarction without residual deficits: Secondary | ICD-10-CM | POA: Diagnosis not present

## 2018-12-10 LAB — CBC WITH DIFFERENTIAL/PLATELET
Abs Immature Granulocytes: 0.04 10*3/uL (ref 0.00–0.07)
Basophils Absolute: 0 10*3/uL (ref 0.0–0.1)
Basophils Relative: 0 %
Eosinophils Absolute: 0 10*3/uL (ref 0.0–0.5)
Eosinophils Relative: 0 %
HCT: 36.6 % — ABNORMAL LOW (ref 39.0–52.0)
Hemoglobin: 12 g/dL — ABNORMAL LOW (ref 13.0–17.0)
Immature Granulocytes: 0 %
Lymphocytes Relative: 13 %
Lymphs Abs: 1.5 10*3/uL (ref 0.7–4.0)
MCH: 28.7 pg (ref 26.0–34.0)
MCHC: 32.8 g/dL (ref 30.0–36.0)
MCV: 87.6 fL (ref 80.0–100.0)
Monocytes Absolute: 1 10*3/uL (ref 0.1–1.0)
Monocytes Relative: 9 %
Neutro Abs: 8.9 10*3/uL — ABNORMAL HIGH (ref 1.7–7.7)
Neutrophils Relative %: 78 %
Platelets: 153 10*3/uL (ref 150–400)
RBC: 4.18 MIL/uL — ABNORMAL LOW (ref 4.22–5.81)
RDW: 14.1 % (ref 11.5–15.5)
WBC: 11.5 10*3/uL — ABNORMAL HIGH (ref 4.0–10.5)
nRBC: 0 % (ref 0.0–0.2)

## 2018-12-10 LAB — BLOOD GAS, VENOUS
Acid-base deficit: 1.6 mmol/L (ref 0.0–2.0)
Bicarbonate: 21.7 mmol/L (ref 20.0–28.0)
O2 Saturation: 87.2 %
Patient temperature: 37
pCO2, Ven: 32 mmHg — ABNORMAL LOW (ref 44.0–60.0)
pH, Ven: 7.44 — ABNORMAL HIGH (ref 7.250–7.430)
pO2, Ven: 51 mmHg — ABNORMAL HIGH (ref 32.0–45.0)

## 2018-12-10 LAB — COMPREHENSIVE METABOLIC PANEL WITH GFR
ALT: 9 U/L (ref 0–44)
AST: 22 U/L (ref 15–41)
Albumin: 2.9 g/dL — ABNORMAL LOW (ref 3.5–5.0)
Alkaline Phosphatase: 102 U/L (ref 38–126)
Anion gap: 17 — ABNORMAL HIGH (ref 5–15)
BUN: 31 mg/dL — ABNORMAL HIGH (ref 8–23)
CO2: 21 mmol/L — ABNORMAL LOW (ref 22–32)
Calcium: 8 mg/dL — ABNORMAL LOW (ref 8.9–10.3)
Chloride: 94 mmol/L — ABNORMAL LOW (ref 98–111)
Creatinine, Ser: 1.85 mg/dL — ABNORMAL HIGH (ref 0.61–1.24)
GFR calc Af Amer: 39 mL/min — ABNORMAL LOW
GFR calc non Af Amer: 34 mL/min — ABNORMAL LOW
Glucose, Bld: 259 mg/dL — ABNORMAL HIGH (ref 70–99)
Potassium: 3.5 mmol/L (ref 3.5–5.1)
Sodium: 132 mmol/L — ABNORMAL LOW (ref 135–145)
Total Bilirubin: 1.1 mg/dL (ref 0.3–1.2)
Total Protein: 7 g/dL (ref 6.5–8.1)

## 2018-12-10 LAB — URINALYSIS, COMPLETE (UACMP) WITH MICROSCOPIC
Bilirubin Urine: NEGATIVE
Glucose, UA: 150 mg/dL — AB
Ketones, ur: 5 mg/dL — AB
Leukocytes,Ua: NEGATIVE
Nitrite: NEGATIVE
Protein, ur: 100 mg/dL — AB
Specific Gravity, Urine: 1.024 (ref 1.005–1.030)
pH: 5 (ref 5.0–8.0)

## 2018-12-10 LAB — TSH: TSH: 1.798 u[IU]/mL (ref 0.350–4.500)

## 2018-12-10 LAB — GLUCOSE, CAPILLARY: Glucose-Capillary: 276 mg/dL — ABNORMAL HIGH (ref 70–99)

## 2018-12-10 LAB — SARS CORONAVIRUS 2 BY RT PCR (HOSPITAL ORDER, PERFORMED IN ~~LOC~~ HOSPITAL LAB): SARS Coronavirus 2: NEGATIVE

## 2018-12-10 LAB — LACTIC ACID, PLASMA: Lactic Acid, Venous: 1.7 mmol/L (ref 0.5–1.9)

## 2018-12-10 MED ORDER — LISINOPRIL 20 MG PO TABS
40.0000 mg | ORAL_TABLET | Freq: Every day | ORAL | Status: DC
  Administered 2018-12-11 – 2018-12-12 (×2): 40 mg via ORAL
  Filled 2018-12-10 (×2): qty 2

## 2018-12-10 MED ORDER — INSULIN ASPART 100 UNIT/ML ~~LOC~~ SOLN
0.0000 [IU] | Freq: Three times a day (TID) | SUBCUTANEOUS | Status: DC
  Administered 2018-12-11 (×2): 3 [IU] via SUBCUTANEOUS
  Administered 2018-12-11: 17:00:00 2 [IU] via SUBCUTANEOUS
  Administered 2018-12-12 – 2018-12-13 (×2): 3 [IU] via SUBCUTANEOUS
  Filled 2018-12-10 (×5): qty 1

## 2018-12-10 MED ORDER — CARBIDOPA-LEVODOPA 25-100 MG PO TABS
1.0000 | ORAL_TABLET | Freq: Three times a day (TID) | ORAL | Status: DC
  Administered 2018-12-11 – 2018-12-13 (×7): 1 via ORAL
  Filled 2018-12-10 (×10): qty 1

## 2018-12-10 MED ORDER — DONEPEZIL HCL 5 MG PO TABS
10.0000 mg | ORAL_TABLET | Freq: Every day | ORAL | Status: DC
  Administered 2018-12-11 – 2018-12-13 (×3): 10 mg via ORAL
  Filled 2018-12-10 (×3): qty 2

## 2018-12-10 MED ORDER — ACETAMINOPHEN 650 MG RE SUPP: 650.0000 mg | Freq: Four times a day (QID) | RECTAL | Status: DC | PRN

## 2018-12-10 MED ORDER — PIPERACILLIN-TAZOBACTAM 3.375 G IVPB
3.3750 g | Freq: Three times a day (TID) | INTRAVENOUS | Status: DC
  Administered 2018-12-11: 3.375 g via INTRAVENOUS
  Filled 2018-12-10: qty 50

## 2018-12-10 MED ORDER — ENOXAPARIN SODIUM 40 MG/0.4ML ~~LOC~~ SOLN
40.0000 mg | SUBCUTANEOUS | Status: DC
  Administered 2018-12-11 – 2018-12-12 (×2): 40 mg via SUBCUTANEOUS
  Filled 2018-12-10 (×2): qty 0.4

## 2018-12-10 MED ORDER — SODIUM CHLORIDE 0.9 % IV SOLN
INTRAVENOUS | Status: DC
  Administered 2018-12-10 – 2018-12-11 (×2): via INTRAVENOUS

## 2018-12-10 MED ORDER — ADULT MULTIVITAMIN W/MINERALS CH
1.0000 | ORAL_TABLET | Freq: Every day | ORAL | Status: DC
  Administered 2018-12-11 – 2018-12-13 (×3): 1 via ORAL
  Filled 2018-12-10 (×3): qty 1

## 2018-12-10 MED ORDER — ASPIRIN EC 81 MG PO TBEC
81.0000 mg | DELAYED_RELEASE_TABLET | Freq: Every day | ORAL | Status: DC
  Administered 2018-12-11 – 2018-12-13 (×3): 81 mg via ORAL
  Filled 2018-12-10 (×3): qty 1

## 2018-12-10 MED ORDER — INSULIN ASPART PROT & ASPART (70-30 MIX) 100 UNIT/ML ~~LOC~~ SUSP
30.0000 [IU] | Freq: Two times a day (BID) | SUBCUTANEOUS | Status: DC
  Administered 2018-12-11 – 2018-12-12 (×4): 30 [IU] via SUBCUTANEOUS
  Filled 2018-12-10 (×4): qty 10

## 2018-12-10 MED ORDER — PIPERACILLIN-TAZOBACTAM 3.375 G IVPB 30 MIN
3.3750 g | Freq: Once | INTRAVENOUS | Status: AC
  Administered 2018-12-10: 3.375 g via INTRAVENOUS
  Filled 2018-12-10: qty 50

## 2018-12-10 MED ORDER — INSULIN ASPART 100 UNIT/ML ~~LOC~~ SOLN
0.0000 [IU] | Freq: Every day | SUBCUTANEOUS | Status: DC
  Administered 2018-12-10: 3 [IU] via SUBCUTANEOUS
  Filled 2018-12-10: qty 1

## 2018-12-10 MED ORDER — HYDRALAZINE HCL 50 MG PO TABS
50.0000 mg | ORAL_TABLET | Freq: Three times a day (TID) | ORAL | Status: DC
  Administered 2018-12-11 – 2018-12-13 (×7): 50 mg via ORAL
  Filled 2018-12-10 (×7): qty 1

## 2018-12-10 MED ORDER — ATORVASTATIN CALCIUM 20 MG PO TABS
40.0000 mg | ORAL_TABLET | Freq: Every day | ORAL | Status: DC
  Administered 2018-12-11 – 2018-12-12 (×2): 40 mg via ORAL
  Filled 2018-12-10 (×2): qty 2

## 2018-12-10 MED ORDER — POLYETHYLENE GLYCOL 3350 17 G PO PACK: 17.0000 g | PACK | Freq: Every day | ORAL | Status: DC | PRN

## 2018-12-10 MED ORDER — ACETAMINOPHEN 325 MG PO TABS
650.0000 mg | ORAL_TABLET | Freq: Four times a day (QID) | ORAL | Status: DC | PRN
  Administered 2018-12-12 (×2): 650 mg via ORAL
  Filled 2018-12-10 (×2): qty 2

## 2018-12-10 MED ORDER — LEVOTHYROXINE SODIUM 50 MCG PO TABS
75.0000 ug | ORAL_TABLET | Freq: Every day | ORAL | Status: DC
  Administered 2018-12-11 – 2018-12-13 (×3): 75 ug via ORAL
  Filled 2018-12-10 (×3): qty 1

## 2018-12-10 MED ORDER — SODIUM CHLORIDE 0.9 % IV SOLN
Freq: Once | INTRAVENOUS | Status: AC
  Administered 2018-12-10: 18:00:00 via INTRAVENOUS

## 2018-12-10 MED ORDER — CLOPIDOGREL BISULFATE 75 MG PO TABS
75.0000 mg | ORAL_TABLET | Freq: Every day | ORAL | Status: DC
  Administered 2018-12-11: 10:00:00 75 mg via ORAL
  Filled 2018-12-10: qty 1

## 2018-12-10 MED ORDER — ATENOLOL 25 MG PO TABS
25.0000 mg | ORAL_TABLET | Freq: Every day | ORAL | Status: DC
  Administered 2018-12-11 – 2018-12-13 (×3): 25 mg via ORAL
  Filled 2018-12-10 (×3): qty 1

## 2018-12-10 MED ORDER — AMLODIPINE BESYLATE 10 MG PO TABS
10.0000 mg | ORAL_TABLET | Freq: Every day | ORAL | Status: DC
  Administered 2018-12-11 – 2018-12-13 (×3): 10 mg via ORAL
  Filled 2018-12-10 (×3): qty 1

## 2018-12-10 MED ORDER — ONDANSETRON HCL 4 MG/2ML IJ SOLN: 4.0000 mg | Freq: Four times a day (QID) | INTRAMUSCULAR | Status: DC | PRN

## 2018-12-10 MED ORDER — ONDANSETRON HCL 4 MG PO TABS: 4.0000 mg | ORAL_TABLET | Freq: Four times a day (QID) | ORAL | Status: DC | PRN

## 2018-12-10 MED ORDER — DIVALPROEX SODIUM 500 MG PO DR TAB
500.0000 mg | DELAYED_RELEASE_TABLET | Freq: Two times a day (BID) | ORAL | Status: DC
  Administered 2018-12-11: 10:00:00 500 mg via ORAL
  Filled 2018-12-10 (×2): qty 1

## 2018-12-10 MED ORDER — VANCOMYCIN HCL IN DEXTROSE 1-5 GM/200ML-% IV SOLN
1000.0000 mg | Freq: Once | INTRAVENOUS | Status: AC
  Administered 2018-12-10: 18:00:00 1000 mg via INTRAVENOUS
  Filled 2018-12-10: qty 200

## 2018-12-10 MED ORDER — VANCOMYCIN HCL IN DEXTROSE 1-5 GM/200ML-% IV SOLN
1000.0000 mg | INTRAVENOUS | Status: DC
  Administered 2018-12-11: 10:00:00 1000 mg via INTRAVENOUS
  Filled 2018-12-10: qty 200

## 2018-12-10 NOTE — ED Notes (Signed)
Called [facility] to inform of VBG sent to lab.

## 2018-12-10 NOTE — Consult Note (Signed)
Pharmacy Antibiotic Note  Gilbert EMENS Sr. is a 78 y.o. male admitted on 12/10/2018 with sepsis.  Pharmacy has been consulted for Vancomycin/Zosyn dosing.  Plan: Patient received Vancomycin 1000mg  x 1 in ED  Will dose Vancomycin 1000 mg IV Q 24 hrs. Goal AUC 400-550. Expected AUC: 496 SCr used: 1.85  Will dose Zosyn 3.375g q8h (4-hour EI dosing)  Height: 5\' 11"  (180.3 cm) Weight: 190 lb (86.2 kg) IBW/kg (Calculated) : 75.3  Temp (24hrs), Avg:98.4 F (36.9 C), Min:98.4 F (36.9 C), Max:98.4 F (36.9 C)  Recent Labs  Lab 12/07/18 0256 12/10/18 1751  WBC 12.5* 11.5*  CREATININE 1.65* 1.85*  LATICACIDVEN  --  1.7    Estimated Creatinine Clearance: 33.9 mL/min (A) (by C-G formula based on SCr of 1.85 mg/dL (H)).    No Known Allergies  Antimicrobials this admission: Vancomycin 8/16 >> Zosyn 8/16 >>  Dose adjustments this admission: None  Microbiology results: 8/16 BCx: pending 8/16 UCx: pending  COVID NEG  Thank you for allowing pharmacy to be a part of this patient's care.  Lu Duffel, PharmD, BCPS Clinical Pharmacist 12/10/2018 9:33 PM

## 2018-12-10 NOTE — ED Notes (Signed)
Pt placed on 2 L [place] d/t room air at 91%.

## 2018-12-10 NOTE — ED Notes (Addendum)
ED TO INPATIENT HANDOFF REPORT  ED Nurse Name and Phone #: Fritzi Mandes [phone]  S Name/Age/Gender Gilbert Finner Sr. 78 y.o. male Room/Bed: ED05A/ED05A  Code Status   Code Status: Full Code  Home/SNF/Other Home Patient oriented to: self, place, time  Is this baseline? Yes   Triage Complete: Triage complete  Chief Complaint Weakness  Triage Note Pt arrives [facility] from home. Was seen recently for N&V&D. DC to home. Hasn't had great PO intake since. C/o weakness. Fell last night, bruising on back per EMS. Takes plavix. Oral temp with EMS 101.8. was hypoxic in mid 80's with EMS, placed on 4 L [place]. EMS reports breathing about 28 times a minute.   Pt is a DM, hasn't taken insulin today. CBG 243.   Arrives A&O, answering questions. EDP at bedside.    Allergies No Known Allergies  Level of Care/Admitting Diagnosis ED Disposition    ED Disposition Condition [facility] [place] [facility] Area: [place] [[id number]]  Level of Care: Med-Surg [16]  Covid Evaluation: Confirmed COVID Negative  Diagnosis: Dehydration [276.51.ICD-9-CM]  Admitting Physician: Mayer Camel [id number]  Attending Physician: Mayer Camel [id number]  Estimated length of stay: past midnight tomorrow  Certification:: I certify this patient will need inpatient services for at least 2 midnights  PT Class (Do Not Modify): Inpatient [101]  PT Acc Code (Do Not Modify): Private [1]       B Medical/Surgery History Past Medical History:  Diagnosis Date  . Diabetes mellitus without complication ([place])   . Hypertension   . Parkinson disease [facility])    Past Surgical History:  Procedure Laterality Date  . ELBOW SURGERY    . NO PAST SURGERIES       A IV Location/Drains/Wounds Patient Lines/Drains/Airways Status   Active Line/Drains/Airways    Name:   Placement date:   Placement time:   Site:   Days:   Peripheral IV 12/10/18 Left Hand   12/10/18    1751    Hand   less than 1   Peripheral  IV 12/10/18 Right Hand   12/10/18    1752    Hand   less than 1          Intake/Output Last 24 hours  Intake/Output Summary (Last 24 hours) at 12/10/2018 2154 Last data filed at 12/10/2018 1915 Gross per 24 hour  Intake 1190.39 ml  Output -  Net 1190.39 ml    Labs/Imaging Results for orders placed or performed during the hospital encounter of 12/10/18 (from the past 48 hour(s))  Blood gas, venous ([facility], [facility], [facility])     Status: Abnormal   Collection Time: 12/10/18  5:45 PM  Result Value Ref Range   pH, Ven 7.44 (H) 7.250 - 7.430   pCO2, Ven 32 (L) 44.0 - 60.0 mmHg   pO2, Ven 51.0 (H) 32.0 - 45.0 mmHg   Bicarbonate 21.7 20.0 - 28.0 mmol/L   Acid-base deficit 1.6 0.0 - 2.0 mmol/L   O2 Saturation 87.2 %   Patient temperature 37.0    Sample type VENOUS     Comment: Performed at [facility], [address] [place], [place] [address]  Lactic acid, plasma     Status: None   Collection Time: 12/10/18  5:51 PM  Result Value Ref Range   Lactic Acid, Venous 1.7 0.5 - 1.9 mmol/L    Comment: Performed at [facility], [address] [place], [place] [address]  Comprehensive metabolic panel  Status: Abnormal   Collection Time: 12/10/18  5:51 PM  Result Value Ref Range   Sodium 132 (L) 135 - 145 mmol/L   Potassium 3.5 3.5 - 5.1 mmol/L   Chloride 94 (L) 98 - 111 mmol/L   CO2 21 (L) 22 - 32 mmol/L   Glucose, Bld 259 (H) 70 - 99 mg/dL   BUN 31 (H) 8 - 23 mg/dL   Creatinine, Ser 1.85 (H) 0.61 - 1.24 mg/dL   Calcium 8.0 (L) 8.9 - 10.3 mg/dL   Total Protein 7.0 6.5 - 8.1 g/dL   Albumin 2.9 (L) 3.5 - 5.0 g/dL   AST 22 15 - 41 U/L   ALT 9 0 - 44 U/L   Alkaline Phosphatase 102 38 - 126 U/L   Total Bilirubin 1.1 0.3 - 1.2 mg/dL   GFR calc non Af Amer 34 (L) >60 mL/min   GFR calc Af Amer 39 (L) >60 mL/min   Anion gap 17 (H) 5 - 15    Comment: Performed at [facility], [address] [place], [place] [address]  CBC WITH DIFFERENTIAL     Status: Abnormal    Collection Time: 12/10/18  5:51 PM  Result Value Ref Range   WBC 11.5 (H) 4.0 - 10.5 K/uL   RBC 4.18 (L) 4.22 - 5.81 MIL/uL   Hemoglobin 12.0 (L) 13.0 - 17.0 g/dL   HCT 36.6 (L) 39.0 - 52.0 %   MCV 87.6 80.0 - 100.0 fL   MCH 28.7 26.0 - 34.0 pg   MCHC 32.8 30.0 - 36.0 g/dL   RDW 14.1 11.5 - 15.5 %   Platelets 153 150 - 400 K/uL   nRBC 0.0 0.0 - 0.2 %   Neutrophils Relative % 78 %   Neutro Abs 8.9 (H) 1.7 - 7.7 K/uL   Lymphocytes Relative 13 %   Lymphs Abs 1.5 0.7 - 4.0 K/uL   Monocytes Relative 9 %   Monocytes Absolute 1.0 0.1 - 1.0 K/uL   Eosinophils Relative 0 %   Eosinophils Absolute 0.0 0.0 - 0.5 K/uL   Basophils Relative 0 %   Basophils Absolute 0.0 0.0 - 0.1 K/uL   Immature Granulocytes 0 %   Abs Immature Granulocytes 0.04 0.00 - 0.07 K/uL    Comment: Performed at [facility], [address] [place], [place] [address]  SARS Coronavirus 2 [facility] order, Performed in [place] hospital lab) Nasopharyngeal Nasopharyngeal Swab     Status: None   Collection Time: 12/10/18  5:51 PM   Specimen: Nasopharyngeal Swab  Result Value Ref Range   SARS Coronavirus 2 NEGATIVE NEGATIVE    Comment: Performed at [facility], [address] [place], [place] [address]  Urinalysis, Complete w Microscopic     Status: Abnormal   Collection Time: 12/10/18  5:57 PM  Result Value Ref Range   Color, Urine YELLOW (A) YELLOW   APPearance HAZY (A) CLEAR   Specific Gravity, Urine 1.024 1.005 - 1.030   pH 5.0 5.0 - 8.0   Glucose, UA 150 (A) NEGATIVE mg/dL   Hgb urine dipstick SMALL (A) NEGATIVE   Bilirubin Urine NEGATIVE NEGATIVE   Ketones, ur 5 (A) NEGATIVE mg/dL   Protein, ur 100 (A) NEGATIVE mg/dL   Nitrite NEGATIVE NEGATIVE   Leukocytes,Ua NEGATIVE NEGATIVE   RBC / HPF 0-5 0 - 5 RBC/hpf   WBC, UA 0-5 0 - 5 WBC/hpf   Bacteria, UA RARE (A) NONE SEEN   Squamous Epithelial / LPF 0-5  0 - 5   Mucus PRESENT    Granular Casts, UA PRESENT     Comment: Performed at  [facility], [address] [place], [place] [address]   Dg Chest Port 1 View  Result Date: 12/10/2018 CLINICAL DATA:  [place] last night with bruising on his back. Weakness. Fever. EXAM: PORTABLE CHEST 1 VIEW COMPARISON:  None. FINDINGS: Enlarged cardiac silhouette. Mildly prominent pulmonary vasculature and interstitial markings. No pleural fluid. No fracture or pneumothorax seen. Tortuous and calcified thoracic aorta. IMPRESSION: 1. Cardiomegaly and mild pulmonary vascular congestion. 2. Mild chronic interstitial lung disease. Electronically Signed   By: Claudie Revering M.D.   On: 12/10/2018 18:19    Pending Labs Unresulted Labs (From admission, onward)    Start     Ordered   12/17/18 0500  Creatinine, serum  (enoxaparin (LOVENOX)    CrCl >/= 30 ml/min)  Weekly,   STAT    Comments: while on enoxaparin therapy    12/10/18 2122   12/11/18 [id number]  Basic metabolic panel  Tomorrow morning,   STAT     12/10/18 2122   12/11/18 0500  CBC  Tomorrow morning,   STAT     12/10/18 2122   12/10/18 2123  Urinalysis, Routine w reflex microscopic  Once,   STAT     12/10/18 2122   12/10/18 2123  Hemoglobin A1c  Once,   STAT     12/10/18 2122   12/10/18 2123  TSH  Once,   STAT     12/10/18 2122   12/10/18 2123  Urine culture  Once,   STAT     12/10/18 2122   12/10/18 2107  Hemoglobin A1c  Once,   STAT    Comments: To assess prior glycemic control    12/10/18 2106   12/10/18 1745  Blood Culture (routine x 2)  BLOOD CULTURE X 2,   STAT     12/10/18 1745   12/10/18 1745  Urine culture  ONCE - STAT,   STAT     12/10/18 1745          Vitals/Pain Today's Vitals   12/10/18 1935 12/10/18 2000 12/10/18 2030 12/10/18 2100  BP:  (!) 157/60 (!) 159/58 (!) 147/58  Pulse:  62 63 (!) 57  Resp:  (!) 21  (!) 22  Temp:      TempSrc:      SpO2:  97% 94% 93%  Weight:      Height:      PainSc: 0-No pain       Isolation Precautions No active isolations  Medications Medications  amLODipine  (NORVASC) tablet 10 mg (has no administration in time range)  aspirin EC tablet 81 mg (has no administration in time range)  atenolol (TENORMIN) tablet 25 mg (has no administration in time range)  atorvastatin (LIPITOR) tablet 40 mg (has no administration in time range)  clopidogrel (PLAVIX) tablet 75 mg (has no administration in time range)  divalproex (DEPAKOTE) DR tablet 500 mg (has no administration in time range)  lisinopril (ZESTRIL) tablet 40 mg (has no administration in time range)  carbidopa-levodopa (SINEMET IR) 25-100 MG per tablet immediate release 1 tablet (has no administration in time range)  donepezil (ARICEPT) tablet 10 mg (has no administration in time range)  hydrALAZINE (APRESOLINE) tablet 50 mg (has no administration in time range)  insulin aspart protamine- aspart (NOVOLOG MIX 70/30) injection 30 Units (has no administration in time range)  levothyroxine (SYNTHROID) tablet 75 mcg (has no administration  in time range)  enoxaparin (LOVENOX) injection 40 mg (has no administration in time range)  acetaminophen (TYLENOL) tablet 650 mg (has no administration in time range)    Or  acetaminophen (TYLENOL) suppository 650 mg (has no administration in time range)  polyethylene glycol (MIRALAX / GLYCOLAX) packet 17 g (has no administration in time range)  ondansetron (ZOFRAN) tablet 4 mg (has no administration in time range)    Or  ondansetron (ZOFRAN) injection 4 mg (has no administration in time range)  multivitamin with minerals tablet 1 tablet (has no administration in time range)  0.9 %  sodium chloride infusion (has no administration in time range)  insulin aspart (novoLOG) injection 0-5 Units (has no administration in time range)  insulin aspart (novoLOG) injection 0-15 Units (has no administration in time range)  vancomycin (VANCOCIN) IVPB 1000 mg/200 mL premix (has no administration in time range)  piperacillin-tazobactam (ZOSYN) IVPB 3.375 g (has no administration in  time range)  0.9 %  sodium chloride infusion ( Intravenous Stopped 12/10/18 1915)  vancomycin (VANCOCIN) IVPB 1000 mg/200 mL premix (0 mg Intravenous Stopped 12/10/18 1913)  piperacillin-tazobactam (ZOSYN) IVPB 3.375 g (0 g Intravenous Stopped 12/10/18 1842)    Mobility walks Moderate fall risk   Focused Assessments na   R Recommendations: See Admitting Provider Note  Report given to:   Additional Notes: pt normally walks but has been progressively getting weaker. Has Parkinsons.

## 2018-12-10 NOTE — ED Triage Notes (Addendum)
Pt arrives [facility] from home. Was seen recently for N&V&D. DC to home. Hasn't had great PO intake since. C/o weakness. Fell last night, bruising on back per EMS. Takes plavix. Oral temp with EMS 101.8. was hypoxic in mid 80's with EMS, placed on 4 L [place]. EMS reports breathing about 28 times a minute.   Pt is a DM, hasn't taken insulin today. CBG 243.   Arrives A&O, answering questions. EDP at bedside.

## 2018-12-10 NOTE — ED Provider Notes (Addendum)
[facility] Emergency Department Provider Note       Time seen: ----------------------------------------- 5:46 PM on 12/10/2018 -----------------------------------------   I have reviewed the triage vital signs and the nursing notes.  HISTORY   Chief Complaint Weakness and Fever   HPI Gilbert SIRKO Sr. is a 78 y.o. male with a history of diabetes, hypertension, Parkinson's, seizures, CVA who presents to the ED for weakness as well as nausea, vomiting with recent diarrhea although none in the last 48 hours.  Patient has not had good oral intake since being seen here recently, fell last night and has bruising on his back according to EMS.  Oral temperature was 101.8 and he was hypoxic in the mid 80s.  He was placed on 4 L nasal cannula oxygen.  EMS reports he was breathing at about 28 times a minute.  Patient states he does not feel short of breath.  Past Medical History:  Diagnosis Date  . Diabetes mellitus without complication ([place])   . Hypertension   . Parkinson disease [facility])     Patient Active Problem List   Diagnosis Date Noted  . Seizures ([place])   . Expressive aphasia   . CVA (cerebral vascular accident) ([place]) 05/05/2016    Past Surgical History:  Procedure Laterality Date  . ELBOW SURGERY    . NO PAST SURGERIES      Allergies Patient has no known allergies.  Social History Social History   Tobacco Use  . Smoking status: Never Smoker  . Smokeless tobacco: Never Used  Substance Use Topics  . Alcohol use: No  . Drug use: No   Review of Systems Constitutional: Positive for fever Cardiovascular: Negative for chest pain. Respiratory: Negative for shortness of breath.  Positive for hypoxia Gastrointestinal: Negative for abdominal pain, positive for vomiting with recent diarrhea Musculoskeletal: Negative for back pain. Skin: Negative for rash. Neurological: Negative for headaches, positive for generalized weakness  All systems  negative/normal/unremarkable except as stated in the HPI  ____________________________________________   PHYSICAL EXAM:  VITAL SIGNS: ED Triage Vitals [12/10/18 1744]  Enc Vitals Group     BP      Pulse Rate 72     Resp (!) 28     Temp      Temp src      SpO2 100 %     Weight      Height      Head Circumference      Peak Flow      Pain Score      Pain Loc      Pain Edu?      Excl. in [place]?    Constitutional: Alert and oriented. Well appearing and in no distress. Eyes: Conjunctivae are normal. Normal extraocular movements. ENT      Head: Normocephalic and atraumatic.      Nose: No congestion/rhinnorhea.      Mouth/Throat: Mucous membranes are moist.      Neck: No stridor. Cardiovascular: Normal rate, regular rhythm. No murmurs, rubs, or gallops. Respiratory: Mild tachypnea with clear breath sounds Gastrointestinal: Soft and nontender. Normal bowel sounds Musculoskeletal: Nontender with normal range of motion in extremities. No lower extremity tenderness nor edema. Neurologic:  Normal speech and language. No gross focal neurologic deficits are appreciated.  Skin:  Skin is warm, dry and intact. No rash noted. Psychiatric: Mood and affect are normal. Speech and behavior are normal.  ____________________________________________  ED COURSE:  As part of my medical decision making, I reviewed  the following data within the [place] History obtained from family if available, nursing notes, old chart and ekg, as well as notes from prior ED visits. Patient presented for fever with fall, weakness, vomiting and recent diarrhea, we will assess with labs and imaging as indicated at this time.   Procedures  Gilbert Finner Sr. was evaluated in Emergency Department on 12/10/2018 for the symptoms described in the history of present illness. He was evaluated in the context of the global COVID-19 pandemic, which necessitated consideration that the patient might be at risk  for infection with the SARS-CoV-2 virus that causes COVID-19. Institutional protocols and algorithms that pertain to the evaluation of patients at risk for COVID-19 are in a state of rapid change based on information released by regulatory bodies including the CDC and federal and state organizations. These policies and algorithms were followed during the patient's care in the ED.  ____________________________________________   LABS (pertinent positives/negatives)  Labs Reviewed  COMPREHENSIVE METABOLIC PANEL - Abnormal; Notable for the following components:      Result Value   Sodium 132 (*)    Chloride 94 (*)    CO2 21 (*)    Glucose, Bld 259 (*)    BUN 31 (*)    Creatinine, Ser 1.85 (*)    Calcium 8.0 (*)    Albumin 2.9 (*)    GFR calc non Af Amer 34 (*)    GFR calc Af Amer 39 (*)    Anion gap 17 (*)    All other components within normal limits  CBC WITH DIFFERENTIAL/PLATELET - Abnormal; Notable for the following components:   WBC 11.5 (*)    RBC 4.18 (*)    Hemoglobin 12.0 (*)    HCT 36.6 (*)    Neutro Abs 8.9 (*)    All other components within normal limits  BLOOD GAS, VENOUS - Abnormal; Notable for the following components:   pH, Ven 7.44 (*)    pCO2, Ven 32 (*)    pO2, Ven 51.0 (*)    All other components within normal limits  URINALYSIS, COMPLETE (UACMP) WITH MICROSCOPIC - Abnormal; Notable for the following components:   Color, Urine YELLOW (*)    APPearance HAZY (*)    Glucose, UA 150 (*)    Hgb urine dipstick SMALL (*)    Ketones, ur 5 (*)    Protein, ur 100 (*)    Bacteria, UA RARE (*)    All other components within normal limits  SARS CORONAVIRUS 2 (HOSPITAL ORDER, [place] [facility])  CULTURE, BLOOD (ROUTINE X 2)  CULTURE, BLOOD (ROUTINE X 2)  URINE CULTURE  LACTIC ACID, PLASMA   EKG: Interpreted by me, sinus rhythm rate of 60 bpm, right bundle branch block, left anterior fascicular block, LVH  RADIOLOGY Images were viewed by  me  Chest x-ray IMPRESSION: 1. Cardiomegaly and mild pulmonary vascular congestion. 2. Mild chronic interstitial lung disease. ____________________________________________   DIFFERENTIAL DIAGNOSIS   COVID-19, dehydration, electrolyte abnormality, sepsis, pneumonia, UTI  FINAL ASSESSMENT AND PLAN  Fever, weakness   Plan: The patient had presented for weakness with recent fall, fever, vomiting and recent diarrhea. Patient's labs have not revealed any acute process. Patient's imaging was also unremarkable.  Patient states he is so weak that he cannot walk.  COVID testing was negative.  He received broad-spectrum antibiotics, I will discuss with the hospitalist for admission.   Laurence Aly, MD    Note: This note  was generated in part or whole with voice recognition software. Voice recognition is usually quite accurate but there are transcription errors that can and very often do occur. I apologize for any typographical errors that were not detected and corrected.     Earleen Newport, MD 12/10/18 2035    Earleen Newport, MD 12/10/18 [id number]

## 2018-12-11 LAB — CBC
HCT: 33.7 % — ABNORMAL LOW (ref 39.0–52.0)
Hemoglobin: 10.9 g/dL — ABNORMAL LOW (ref 13.0–17.0)
MCH: 28.8 pg (ref 26.0–34.0)
MCHC: 32.3 g/dL (ref 30.0–36.0)
MCV: 89.2 fL (ref 80.0–100.0)
Platelets: 128 10*3/uL — ABNORMAL LOW (ref 150–400)
RBC: 3.78 MIL/uL — ABNORMAL LOW (ref 4.22–5.81)
RDW: 13.9 % (ref 11.5–15.5)
WBC: 9.7 10*3/uL (ref 4.0–10.5)
nRBC: 0 % (ref 0.0–0.2)

## 2018-12-11 LAB — GLUCOSE, CAPILLARY
Glucose-Capillary: 287 mg/dL — ABNORMAL HIGH (ref 70–99)
Glucose-Capillary: 179 mg/dL — ABNORMAL HIGH (ref 70–99)
Glucose-Capillary: 185 mg/dL — ABNORMAL HIGH (ref 70–99)
Glucose-Capillary: 141 mg/dL — ABNORMAL HIGH (ref 70–99)
Glucose-Capillary: 91 mg/dL (ref 70–99)

## 2018-12-11 LAB — BASIC METABOLIC PANEL
Anion gap: 13 (ref 5–15)
BUN: 27 mg/dL — ABNORMAL HIGH (ref 8–23)
CO2: 24 mmol/L (ref 22–32)
Calcium: 7.6 mg/dL — ABNORMAL LOW (ref 8.9–10.3)
Chloride: 96 mmol/L — ABNORMAL LOW (ref 98–111)
Creatinine, Ser: 1.71 mg/dL — ABNORMAL HIGH (ref 0.61–1.24)
GFR calc Af Amer: 43 mL/min — ABNORMAL LOW (ref >60)
GFR calc non Af Amer: 37 mL/min — ABNORMAL LOW (ref >60)
Glucose, Bld: 241 mg/dL — ABNORMAL HIGH (ref 70–99)
Potassium: 3.5 mmol/L (ref 3.5–5.1)
Sodium: 133 mmol/L — ABNORMAL LOW (ref 135–145)

## 2018-12-11 LAB — HEMOGLOBIN A1C
Hgb A1c MFr Bld: 6.9 % — ABNORMAL HIGH (ref 4.8–5.6)
Mean Plasma Glucose: 151.33 mg/dL

## 2018-12-11 LAB — BRAIN NATRIURETIC PEPTIDE: B Natriuretic Peptide: 616 pg/mL — ABNORMAL HIGH (ref 0.0–100.0)

## 2018-12-11 MED ORDER — METRONIDAZOLE IN NACL 5-0.79 MG/ML-% IV SOLN: 500.0000 mg | Freq: Three times a day (TID) | INTRAVENOUS | Status: DC

## 2018-12-11 MED ORDER — DIVALPROEX SODIUM ER 500 MG PO TB24
500.0000 mg | ORAL_TABLET | Freq: Two times a day (BID) | ORAL | Status: DC
  Administered 2018-12-11 – 2018-12-13 (×4): 500 mg via ORAL
  Filled 2018-12-11 (×5): qty 1

## 2018-12-11 NOTE — Plan of Care (Signed)

## 2018-12-11 NOTE — Progress Notes (Signed)
[facility] [place] NAME: Gilbert [name]    MR#:  [id number]  DATE OF BIRTH:  [birth year]  SUBJECTIVE:   Patient came in with nausea vomiting diarrhea and poor appetite. He is out in the chair. Wife in the room. Denies any fever abdominal pain nausea vomiting. He just does not feel like eating. REVIEW OF SYSTEMS:   Review of Systems  Constitutional: Positive for malaise/fatigue. Negative for chills, fever and weight loss.  HENT: Negative for ear discharge, ear pain and nosebleeds.   Eyes: Negative for blurred vision, pain and discharge.  Respiratory: Negative for sputum production, shortness of breath, wheezing and stridor.   Cardiovascular: Negative for chest pain, palpitations, orthopnea and PND.  Gastrointestinal: Negative for abdominal pain, diarrhea, nausea and vomiting.  Genitourinary: Negative for frequency and urgency.  Musculoskeletal: Negative for back pain and joint pain.  Neurological: Positive for weakness. Negative for sensory change, speech change and focal weakness.  Psychiatric/Behavioral: Negative for depression and hallucinations. The patient is not nervous/anxious.    Tolerating Diet:poor Tolerating PT: rehab  DRUG ALLERGIES:  No Known Allergies  VITALS:  Blood pressure (!) 142/67, pulse 64, temperature 98.9 F (37.2 C), resp. rate 16, height 5\' 11"  (1.803 m), weight 86.2 kg, SpO2 92 %.  PHYSICAL EXAMINATION:   Physical Exam  GENERAL:  78 y.o.-year-old patient lying in the bed with no acute distress.  EYES: Pupils equal, round, reactive to light and accommodation. No scleral icterus. Extraocular muscles intact.  HEENT: Head atraumatic, normocephalic. Oropharynx and nasopharynx clear.  NECK:  Supple, no jugular venous distention. No thyroid enlargement, no tenderness.  LUNGS: Normal breath sounds bilaterally, no wheezing, rales, rhonchi. No use of accessory muscles of respiration.  CARDIOVASCULAR: S1, S2  normal. No murmurs, rubs, or gallops.  ABDOMEN: Soft, nontender, nondistended. Bowel sounds present. No organomegaly or mass.  EXTREMITIES: No cyanosis, clubbing or edema b/l.    NEUROLOGIC: Cranial nerves II through XII are intact. No focal Motor or sensory deficits b/l.   PSYCHIATRIC:  patient is alert and oriented x 3.  SKIN: No obvious rash, lesion, or ulcer.   LABORATORY PANEL:  CBC Recent Labs  Lab 12/11/18 0203  WBC 9.7  HGB 10.9*  HCT 33.7*  PLT 128*    Chemistries  Recent Labs  Lab 12/10/18 1751 12/11/18 0203  NA 132* 133*  K 3.5 3.5  CL 94* 96*  CO2 21* 24  GLUCOSE 259* 241*  BUN 31* 27*  CREATININE 1.85* 1.71*  CALCIUM 8.0* 7.6*  AST 22  --   ALT 9  --   ALKPHOS 102  --   BILITOT 1.1  --    Cardiac Enzymes No results for input(s): TROPONINI in the last 168 hours. RADIOLOGY:  Dg Chest Port 1 View  Result Date: 12/10/2018 CLINICAL DATA:  [place] last night with bruising on his back. Weakness. Fever. EXAM: PORTABLE CHEST 1 VIEW COMPARISON:  None. FINDINGS: Enlarged cardiac silhouette. Mildly prominent pulmonary vasculature and interstitial markings. No pleural fluid. No fracture or pneumothorax seen. Tortuous and calcified thoracic aorta. IMPRESSION: 1. Cardiomegaly and mild pulmonary vascular congestion. 2. Mild chronic interstitial lung disease. Electronically Signed   By: Claudie Revering M.D.   On: 12/10/2018 18:19   ASSESSMENT AND PLAN:  Gilbert [name]  is a 78 y.o. male with a known history of Parkinson's disease, diabetes, hypertension, seizures, CVA.  He presented to the emergency room accompanied by his wife with complaints of generalized weakness with  nausea and vomiting as well as recent diarrhea.    1.  Dehydration-- acute on chronic kidney failure. Baseline creatinine around 1.6 - Possibly secondary to infection of unknown source - Patient is receiving normal saline at 100 cc/h to peripheral IV for fluid resuscitation -  creatinine improving. Patient  encourage oral intake.  2.  Acute kidney injury -Secondary to #1 - Fluid resuscitation -Repeat BMP in the a.m. and monitor renal function closely  3.  Acute gastroenteritis - With infection of unknown source -d/c broad-spectrum antibiotic. - GI panel and C. difficile pending-- no diarrhea documented. - We will treat symptomatically with antiemetic for nausea vomiting and antidiarrheals after cultures obtained - Blood culture negative  4.  Diabetes mellitus - Moderate sliding scale insulin  5.  Generalized weakness with history of Parkinson's disease - PT and OT consulted for supportive care  DVT and PPI prophylaxis initiated Case discussed with Care Management/Social Worker. Management plans discussed with the patient, family and they are in agreement.  CODE STATUS: full  DVT Prophylaxis: lovnoex  TOTAL TIME TAKING CARE OF THIS PATIENT: 25* minutes.  >50% time spent on counselling and coordination of care  POSSIBLE D/C IN 1-2* DAYS, DEPENDING ON CLINICAL CONDITION.  Note: This dictation was prepared with Dragon dictation along with smaller phrase technology. Any transcriptional errors that result from this process are unintentional.  Fritzi Mandes M.D on 12/11/2018 at 3:47 PM  Between 7am to 6pm - Pager - [phone]  After 6pm go to [email or web address] - password [email or web address] [facility]  Office  [phone]  CC: Primary care physician; [name], MDPatient ID: Gilbert Finner Sr., male   DOB: [birth year], 78 y.o.   MRN: [id number]

## 2018-12-11 NOTE — TOC Initial Note (Signed)
Transition of Care (TOC) - Initial/Assessment Note    Patient Details  Name: Gilbert EPPINGER Sr. MRN: [id number] Date of Birth: 1941/03/06  Transition of Care [facility]) CM/SW Contact:    Grecia Lynk, Lenice Llamas Phone Number: [phone]  12/11/2018, 5:37 PM  Clinical Narrative: Clinical Social Worker (CSW) met with patient to discuss D/C plan and his wife Gilbert [name] was at bedside. PT is recommending SNF. CSW introduced self and explained role of CSW department. Per patient he lives in [place] with his wife Gilbert [name]. CSW explained that no visitors are allowed at the SNFs now due to covid-19. Wife and patient are agreeable to SNF search in [facility] however they prefer for patient to go home if he can't have visitors at the SNF. Per wife and patient they will discuss going home vs going to a SNF and will get back to CSW with heir decision. FL2 complete and faxed out. CSW explained that patient's insurance Health Team will have to approve SNF. Wife verbalized her understanding. CSW will continue to follow and assist as needed.               Expected Discharge Plan: Skilled Nursing Facility Barriers to Discharge: Continued Medical Work up   Patient Goals and CMS Choice Patient states their goals for this hospitalization and ongoing recovery are:: To go home.      Expected Discharge Plan and Services Expected Discharge Plan: [place] In-house Referral: Clinical Social Work Discharge Planning Services: CM Consult Post Acute Care Choice: [place] Living arrangements for the past 2 months: [place]                                      Prior Living Arrangements/Services Living arrangements for the past 2 months: Single Family Home Lives with:: Spouse Patient language and need for interpreter reviewed:: No Do you feel safe going back to the place where you live?: Yes      Need for Family Participation in Patient Care: Yes (Comment) Care  giver support system in place?: Yes (comment)   Criminal Activity/Legal Involvement Pertinent to Current Situation/Hospitalization: No - Comment as needed  Activities of Daily Living Home Assistive Devices/Equipment: None ADL Screening (condition at time of admission) Patient's cognitive ability adequate to safely complete daily activities?: Yes Is the patient deaf or have difficulty hearing?: No Does the patient have difficulty seeing, even when wearing glasses/contacts?: No Does the patient have difficulty concentrating, remembering, or making decisions?: No Patient able to express need for assistance with ADLs?: Yes Does the patient have difficulty dressing or bathing?: Yes Independently performs ADLs?: No Communication: Needs assistance Is this a change from baseline?: Change from baseline, expected to last <3 days Dressing (OT): Needs assistance Is this a change from baseline?: Change from baseline, expected to last <3days Grooming: Needs assistance Is this a change from baseline?: Change from baseline, expected to last <3 days Feeding: Independent Bathing: Needs assistance Is this a change from baseline?: Pre-admission baseline Toileting: Needs assistance Is this a change from baseline?: Change from baseline, expected to last <3 days In/Out Bed: Needs assistance Is this a change from baseline?: Change from baseline, expected to last <3 days Walks in Home: Needs assistance Is this a change from baseline?: Change from baseline, expected to last <3 days Does the patient have difficulty walking or climbing stairs?: Yes Weakness of Legs: Both Weakness of  Arms/Hands: Both  Permission Sought/Granted Permission sought to share information with : Chartered certified accountant granted to share information with : Yes, Verbal Permission Granted              Emotional Assessment Appearance:: Appears stated age Attitude/Demeanor/Rapport: Engaged Affect (typically  observed): Pleasant, Calm Orientation: : Oriented to Self, Oriented to Place, Fluctuating Orientation (Suspected and/or reported Sundowners) Alcohol / Substance Use: Not Applicable Psych Involvement: No (comment)  Admission diagnosis:  Weakness [R53.1] Fever, unspecified fever cause [R50.9] Patient Active Problem List   Diagnosis Date Noted  . Dehydration 12/10/2018  . Seizures ([place])   . Expressive aphasia   . CVA (cerebral vascular accident) ([place]) 05/05/2016   PCP:  [name], MD Pharmacy:   [facility], [place] [address] Phone: [phone] Fax: [place], [place] [address] [place] [address] Phone: [phone] Fax: [place], [place] [address] Phone: [phone] Fax: [phone]     Social Determinants of Health (SDOH) Interventions    Readmission Risk Interventions No flowsheet data found.

## 2018-12-11 NOTE — Plan of Care (Signed)

## 2018-12-11 NOTE — H&P (Signed)
[facility] [place] NAME: Gilbert [name]    MR#:  [id number]  DATE OF BIRTH:  29-May-1940  DATE OF ADMISSION:  12/10/2018  PRIMARY CARE PHYSICIAN: [name], MD   REQUESTING/REFERRING PHYSICIAN: Lenise Arena, MD  CHIEF COMPLAINT:   Chief Complaint  Patient presents with  . Weakness  . Fever    HISTORY OF PRESENT ILLNESS:  Gilbert [name]  is a 78 y.o. male with a known history of Parkinson's disease, diabetes, hypertension, seizures, CVA.  He presented to the emergency room accompanied by his wife with complaints of generalized weakness with nausea and vomiting as well as recent diarrhea.  He has had no nausea, vomiting, or diarrhea in the last 48 hours.  He has also noted decreased appetite with poor oral intake.  He was seen with similar symptoms on 12/07/2018 diagnosed with diarrhea and abdominal pain.  He was discharged home to follow-up with his primary care physician.  Patient now presents reporting increasing weakness and continued poor oral intake and appetite.  He was febrile with temperature 101.8 on EMS arrival with oxygen saturation in the mid 80s.  He is on O2 at 4 L/min with oxygen saturation above 96%.  Patient reports he does not feel short of breath.  He denies chest pain.  He denies abdominal pain currently.  He denies having had episode of nausea, no vomiting, or diarrhea over the last 36 hours.  WBC is 11.9.  Chest x-ray shows mild pulmonary vascular congestion with mild chronic interstitial lung disease.  BUN is 31 with creatinine 1.85.  Glucose is 259.  We have admitted him to the hospitalist service for dehydration and acute kidney injury.  With evidence of mild pulmonary vascular congestion on chest x-ray, BNP is pending.    PAST MEDICAL HISTORY:   Past Medical History:  Diagnosis Date  . Diabetes mellitus without complication ([place])   . Hypertension   . Parkinson disease ([place])     PAST SURGICAL  HISTORY:   Past Surgical History:  Procedure Laterality Date  . ELBOW SURGERY    . NO PAST SURGERIES      SOCIAL HISTORY:   Social History   Tobacco Use  . Smoking status: Never Smoker  . Smokeless tobacco: Never Used  Substance Use Topics  . Alcohol use: No    FAMILY HISTORY:   Family History  Problem Relation Age of Onset  . Brain cancer Mother   . Prostate cancer Neg Hx   . Bladder Cancer Neg Hx   . Kidney cancer Neg Hx     DRUG ALLERGIES:  No Known Allergies  REVIEW OF SYSTEMS:   Review of Systems  Constitutional: Positive for chills, fever and malaise/fatigue.  HENT: Negative for congestion, sinus pain and sore throat.   Eyes: Negative for blurred vision and double vision.  Respiratory: Negative for cough, sputum production, shortness of breath and wheezing.   Cardiovascular: Negative for chest pain and leg swelling.  Gastrointestinal: Positive for abdominal pain, diarrhea, nausea and vomiting. Negative for blood in stool, constipation, heartburn and melena.  Genitourinary: Negative for dysuria, flank pain and hematuria.  Musculoskeletal: Positive for falls and myalgias.  Skin: Negative for itching and rash.  Neurological: Positive for weakness. Negative for dizziness and headaches.  Psychiatric/Behavioral: Negative for depression.   MEDICATIONS AT HOME:   Prior to Admission medications   Medication Sig Start Date End Date Taking? Authorizing Provider  amLODipine (NORVASC) 10 MG tablet Take  1 tablet (10 mg total) by mouth daily. 05/10/16  Yes Gladstone Lighter, MD  aspirin EC 81 MG EC tablet Take 1 tablet (81 mg total) by mouth daily. 05/10/16  Yes Gladstone Lighter, MD  atenolol (TENORMIN) 25 MG tablet Take 1 tablet (25 mg total) by mouth daily. 05/10/16  Yes Gladstone Lighter, MD  clopidogrel (PLAVIX) 75 MG tablet Take 1 tablet (75 mg total) by mouth daily. 05/10/16  Yes Gladstone Lighter, MD  divalproex (DEPAKOTE) 500 MG DR tablet Take 1 tablet (500 mg  total) by mouth 2 (two) times daily. 05/10/16  Yes Gladstone Lighter, MD  insulin aspart protamine- aspart (NOVOLOG MIX 70/30) (70-30) 100 UNIT/ML injection Inject 30 Units into the skin 2 (two) times daily with a meal. 30 units subq morning and evening   Yes [provider]  levothyroxine (SYNTHROID, LEVOTHROID) 75 MCG tablet Take 75 mcg by mouth daily before breakfast.   Yes [provider]  lisinopril (PRINIVIL,ZESTRIL) 40 MG tablet Take 1 tablet (40 mg total) by mouth at bedtime. 05/10/16  Yes Gladstone Lighter, MD  atorvastatin (LIPITOR) 40 MG tablet Take 1 tablet (40 mg total) by mouth daily at 6 PM. Patient not taking: Reported on 05/26/2017 05/10/16   Gladstone Lighter, MD  carbidopa-levodopa (SINEMET IR) 25-100 MG tablet Take 1 tablet by mouth 3 (three) times daily. 12/07/18   [provider]  divalproex (DEPAKOTE ER) 500 MG 24 hr tablet Take 1 tablet by mouth 2 (two) times daily. 10/02/18   [provider]  donepezil (ARICEPT) 10 MG tablet Take 10 mg by mouth daily. 11/23/18   [provider]  hydrALAZINE (APRESOLINE) 50 MG tablet Take 1 tablet by mouth 3 (three) times daily. 09/28/18   [provider]      VITAL SIGNS:  Blood pressure (!) 159/73, pulse 65, temperature 97.9 F (36.6 C), resp. rate 16, height 5\' 11"  (1.803 m), weight 86.2 kg, SpO2 97 %.  PHYSICAL EXAMINATION:  Physical Exam  GENERAL:  78 y.o.-year-old patient lying in the bed with no acute distress.  EYES: Pupils equal, round, reactive to light and accommodation. No scleral icterus. Extraocular muscles intact.  HEENT: Head atraumatic, normocephalic. Oropharynx and nasopharynx clear.  NECK:  Supple, no jugular venous distention. No thyroid enlargement, no tenderness.  LUNGS: Normal breath sounds bilaterally, no wheezing, rales,rhonchi or crepitation. No use of accessory muscles of respiration.  CARDIOVASCULAR: Regular rate and rhythm, S1, S2 normal. No murmurs, rubs, or  gallops.  ABDOMEN: Soft, nondistended, nontender. Bowel sounds present. No organomegaly or mass.  EXTREMITIES: No pedal edema, cyanosis, or clubbing.  NEUROLOGIC: Cranial nerves II through XII are intact.  Generalized weakness. Sensation intact. Gait not checked.  PSYCHIATRIC: The patient is alert and oriented x 3.  Normal affect and good eye contact. SKIN: No obvious rash, lesion, or ulcer.   LABORATORY PANEL:   CBC Recent Labs  Lab 12/10/18 1751  WBC 11.5*  HGB 12.0*  HCT 36.6*  PLT 153   ------------------------------------------------------------------------------------------------------------------  Chemistries  Recent Labs  Lab 12/10/18 1751  NA 132*  K 3.5  CL 94*  CO2 21*  GLUCOSE 259*  BUN 31*  CREATININE 1.85*  CALCIUM 8.0*  AST 22  ALT 9  ALKPHOS 102  BILITOT 1.1   ------------------------------------------------------------------------------------------------------------------  Cardiac Enzymes No results for input(s): TROPONINI in the last 168 hours. ------------------------------------------------------------------------------------------------------------------  RADIOLOGY:  Dg Chest Port 1 View  Result Date: 12/10/2018 CLINICAL DATA:  [place] last night with bruising on his back. Weakness. Fever. EXAM:  PORTABLE CHEST 1 VIEW COMPARISON:  None. FINDINGS: Enlarged cardiac silhouette. Mildly prominent pulmonary vasculature and interstitial markings. No pleural fluid. No fracture or pneumothorax seen. Tortuous and calcified thoracic aorta. IMPRESSION: 1. Cardiomegaly and mild pulmonary vascular congestion. 2. Mild chronic interstitial lung disease. Electronically Signed   By: Claudie Revering M.D.   On: 12/10/2018 18:19      IMPRESSION AND PLAN:   1.  Dehydration - Possibly secondary to infection of unknown source - Patient is receiving normal saline at 100 cc/h to peripheral IV for fluid resuscitation - Repeat CBC and BMP in the a.m. -Telemetry monitoring   2.  Acute kidney injury -Secondary to #1 - Fluid resuscitation -Repeat BMP in the a.m. and monitor renal function closely  3.  Acute gastroenteritis - With infection of unknown source - Patient started on IV antibiotic therapy with vancomycin, Zosyn, and Flagyl. - GI panel and C. difficile pending -Repeat CBC in the a.m. - We will treat symptomatically with antiemetic for nausea vomiting and antidiarrheals after cultures obtained - Blood and urine cultures pending  4.  Diabetes mellitus - Moderate sliding scale insulin -Hemoglobin A1c  5.  Generalized weakness with history of Parkinson's disease - PT and OT consulted for supportive care  DVT and PPI prophylaxis initiated    All the records are reviewed and case discussed with ED provider. The plan of care was discussed in details with the patient (and family). I answered all questions. The patient agreed to proceed with the above mentioned plan. Further management will depend upon hospital course.   CODE STATUS: Full code  TOTAL TIME TAKING CARE OF THIS PATIENT: 45 minutes.    [place] on 12/11/2018 at 1:29 AM  Pager - [phone]  After 6pm go to [email or web address] - Proofreader  [facility]  Office  [phone]  CC: Primary care physician; [name], MD   Note: This dictation was prepared with Dragon dictation along with smaller phrase technology. Any transcriptional errors that result from this process are unintentional.

## 2018-12-11 NOTE — Evaluation (Signed)
Physical Therapy Evaluation Patient Details Name: Gilbert LORENSON Sr. MRN: [id number] DOB: June 02, 1940 Today's Date: 12/11/2018   History of Present Illness  Pt admitted for dehydration. Pt complains of weakness/fever. History includes Parkinson's, DM, HTN, seizures, and CVA.  Clinical Impression  Pt is a pleasant 78 year old male who was admitted for dehydration. Pt performs bed mobility with mod, transfers with mod assist, and ambulation with min assist and RW. Pt somewhat confused, asked if he was supposed to sit on RW. Therapist showed him the recliner and requested he sit in recliner. Pt demonstrates deficits with strength/mobility/balance. Pt has had multiple falls, currently not at baseline. High risk for recurring falls without assist. Would benefit from skilled PT to address above deficits and promote optimal return to PLOF; recommend transition to STR upon discharge from acute hospitalization.     Follow Up Recommendations SNF    Equipment Recommendations  Rolling walker with 5" wheels    Recommendations for Other Services       Precautions / Restrictions Precautions Precautions: Fall Restrictions Weight Bearing Restrictions: No      Mobility  Bed Mobility Overal bed mobility: Needs Assistance Bed Mobility: Supine to Sit     Supine to sit: Mod assist     General bed mobility comments: has difficulty with trunk control, needs heavy assist to come to seated position at EOB. Pt has difficulty sitting at EOB with significant post lateral leaning noted. Able to improve and sit with min assist with cues  Transfers Overall transfer level: Needs assistance Equipment used: Rolling walker (2 wheeled) Transfers: Sit to/from Stand Sit to Stand: Mod assist         General transfer comment: bed elevated for transfer. RW used with cues for hand placement prior to attempt. Once standing, needs cues to keep RW close to body  Ambulation/Gait Ambulation/Gait assistance: Min  assist Gait Distance (Feet): 3 Feet Assistive device: Rolling walker (2 wheeled) Gait Pattern/deviations: Step-to pattern     General Gait Details: ambulated with cautious stepping over to recliner. RW used and adjusted prior to mobility efforts. Pt fatigues with "plop" into recliner  Stairs            Wheelchair Mobility    Modified Rankin (Stroke Patients Only)       Balance Overall balance assessment: Needs assistance;History of Falls Sitting-balance support: Feet supported Sitting balance-Leahy Scale: Poor Sitting balance - Comments: heavy post lean present   Standing balance support: Bilateral upper extremity supported Standing balance-Leahy Scale: Fair                               Pertinent Vitals/Pain Pain Assessment: No/denies pain    Home Living Family/patient expects to be discharged to:: Private residence Living Arrangements: Spouse/significant other Available Help at Discharge: Family Type of Home: House Home Access: Stairs to enter Entrance Stairs-Rails: Can reach both Entrance Stairs-Number of Steps: 3 Home Layout: Two level;Able to live on main level with bedroom/bathroom Home Equipment: Kasandra Knudsen - single point      Prior Function Level of Independence: Independent         Comments: was previously independent, however per wife was having multiple falls recently. Not using AD     Hand Dominance        Extremity/Trunk Assessment   Upper Extremity Assessment Upper Extremity Assessment: Generalized weakness(B UE grossly 4/5)    Lower Extremity Assessment Lower Extremity Assessment: Generalized weakness(B LE grossly  3+/5)       Communication   Communication: No difficulties  Cognition Arousal/Alertness: Awake/alert Behavior During Therapy: WFL for tasks assessed/performed Overall Cognitive Status: Within Functional Limits for tasks assessed                                 General Comments: appears confused  at times, otherwise alert. HOH      General Comments      Exercises Other Exercises Other Exercises: supine ther-ex performed on B LE including SLRs, AP, and hip abd/add. All ther-ex performed x 10 reps with fatigue present/weakness. CGA given   Assessment/Plan    PT Assessment Patient needs continued PT services  PT Problem List Decreased strength;Decreased activity tolerance;Decreased balance;Decreased mobility;Decreased safety awareness;Decreased knowledge of use of DME       PT Treatment Interventions Gait training;DME instruction;Therapeutic exercise;Balance training    PT Goals (Current goals can be found in the Care Plan section)  Acute Rehab PT Goals Patient Stated Goal: to go home PT Goal Formulation: With patient Time For Goal Achievement: 12/25/18 Potential to Achieve Goals: Good    Frequency Min 2X/week   Barriers to discharge        Co-evaluation               AM-PAC PT "6 Clicks" Mobility  Outcome Measure Help needed turning from your back to your side while in a flat bed without using bedrails?: A Little Help needed moving from lying on your back to sitting on the side of a flat bed without using bedrails?: A Lot Help needed moving to and from a bed to a chair (including a wheelchair)?: A Little Help needed standing up from a chair using your arms (e.g., wheelchair or bedside chair)?: A Little Help needed to walk in hospital room?: A Lot Help needed climbing 3-5 steps with a railing? : A Lot 6 Click Score: 15    End of Session Equipment Utilized During Treatment: Gait belt Activity Tolerance: Patient tolerated treatment well Patient left: in chair;with chair alarm set Nurse Communication: Mobility status PT Visit Diagnosis: Muscle weakness (generalized) (M62.81);Difficulty in walking, not elsewhere classified (R26.2);Unsteadiness on feet (R26.81);History of falling (Z91.81)    Time: [id number] PT Time Calculation (min) (ACUTE ONLY): 26  min   Charges:   PT Evaluation $PT Eval Low Complexity: 1 Low PT Treatments $Therapeutic Exercise: 8-22 mins        Greggory Stallion, PT, DPT [phone]   Patrice Moates 12/11/2018, 1:08 PM

## 2018-12-11 NOTE — NC FL2 (Signed)
[place] LEVEL OF CARE SCREENING TOOL     IDENTIFICATION  Patient Name: Gilbert ALLSUP Sr. Birthdate: 02-15-1941 Sex: male Admission Date (Current Location): 12/10/2018  [place] and [place] Number:  Engineering geologist and Address:  [facility], [address] [place], [place] [address]      Provider Number: [id number]  Attending Physician Name and Address:  Fritzi Mandes, MD  Relative Name and Phone Number:       Current Level of Care: Hospital Recommended Level of Care: [place] Prior Approval Number:    Date Approved/Denied:   PASRR Number: [id number]  Discharge Plan: SNF    Current Diagnoses: Patient Active Problem List   Diagnosis Date Noted  . Dehydration 12/10/2018  . Seizures ([place])   . Expressive aphasia   . CVA (cerebral vascular accident) ([place]) 05/05/2016    Orientation RESPIRATION BLADDER Height & Weight     Self, Place  Normal Continent Weight: 190 lb (86.2 kg) Height:  5\' 11"  (180.3 cm)  BEHAVIORAL SYMPTOMS/MOOD NEUROLOGICAL BOWEL NUTRITION STATUS      Continent Diet(Diet: Carb Modified.)  AMBULATORY STATUS COMMUNICATION OF NEEDS Skin   Extensive Assist Verbally Normal                       Personal Care Assistance Level of Assistance  Bathing, Feeding, Dressing Bathing Assistance: Limited assistance Feeding assistance: Independent Dressing Assistance: Limited assistance     Functional Limitations Info  Sight, Hearing, Speech Sight Info: Adequate Hearing Info: Adequate Speech Info: Adequate    SPECIAL CARE FACTORS FREQUENCY  PT (By licensed PT), OT (By licensed OT)     PT Frequency: 5 OT Frequency: 5            Contractures      Additional Factors Info  Code Status, Allergies Code Status Info: Full Code. Allergies Info: No Known Allergies.           Current Medications (12/11/2018):  This is the current hospital active medication list Current  Facility-Administered Medications  Medication Dose Route Frequency Provider Last Rate Last Dose  . acetaminophen (TYLENOL) tablet 650 mg  650 mg Oral Q6H PRN Seals, Theo Dills, NP       Or  . acetaminophen (TYLENOL) suppository 650 mg  650 mg Rectal Q6H PRN Seals, Theo Dills, NP      . amLODipine (NORVASC) tablet 10 mg  10 mg Oral Daily Seals, Angela H, NP   10 mg at 12/11/18 1014  . aspirin EC tablet 81 mg  81 mg Oral Daily Seals, Theo Dills, NP   81 mg at 12/11/18 1014  . atenolol (TENORMIN) tablet 25 mg  25 mg Oral Daily Seals, Angela H, NP   25 mg at 12/11/18 1015  . atorvastatin (LIPITOR) tablet 40 mg  40 mg Oral q1800 Seals, Levada Dy H, NP   40 mg at 12/11/18 1722  . carbidopa-levodopa (SINEMET IR) 25-100 MG per tablet immediate release 1 tablet  1 tablet Oral TID Mayer Camel, NP   1 tablet at 12/11/18 1629  . divalproex (DEPAKOTE ER) 24 hr tablet 500 mg  500 mg Oral BID Fritzi Mandes, MD      . donepezil (ARICEPT) tablet 10 mg  10 mg Oral Daily Seals, Angela H, NP   10 mg at 12/11/18 1014  . enoxaparin (LOVENOX) injection 40 mg  40 mg Subcutaneous Q24H Seals, Theo Dills, NP      . hydrALAZINE (  APRESOLINE) tablet 50 mg  50 mg Oral TID Gardiner Barefoot H, NP   50 mg at 12/11/18 1629  . insulin aspart (novoLOG) injection 0-15 Units  0-15 Units Subcutaneous TID WC Seals, Theo Dills, NP   2 Units at 12/11/18 1722  . insulin aspart (novoLOG) injection 0-5 Units  0-5 Units Subcutaneous QHS Mayer Camel, NP   3 Units at 12/10/18 2333  . insulin aspart protamine- aspart (NOVOLOG MIX 70/30) injection 30 Units  30 Units Subcutaneous BID WC Seals, Angela H, NP   30 Units at 12/11/18 1723  . levothyroxine (SYNTHROID) tablet 75 mcg  75 mcg Oral QAC breakfast Gardiner Barefoot H, NP   75 mcg at 12/11/18 0553  . lisinopril (ZESTRIL) tablet 40 mg  40 mg Oral QHS Seals, Angela H, NP      . multivitamin with minerals tablet 1 tablet  1 tablet Oral Daily Seals, Theo Dills, NP   1 tablet at 12/11/18 1014  . ondansetron  (ZOFRAN) tablet 4 mg  4 mg Oral Q6H PRN Seals, Theo Dills, NP       Or  . ondansetron (ZOFRAN) injection 4 mg  4 mg Intravenous Q6H PRN Seals, Angela H, NP      . polyethylene glycol (MIRALAX / GLYCOLAX) packet 17 g  17 g Oral Daily PRN Seals, Theo Dills, NP         Discharge Medications: Please see discharge summary for a list of discharge medications.  Relevant Imaging Results:  Relevant Lab Results:   Additional Information SSN: [id number]  Erasm[facility] Bistline, Veronia Beets, LCSW

## 2018-12-11 NOTE — Evaluation (Signed)
Occupational Therapy Evaluation Patient Details Name: Gilbert SPARE Sr. MRN: [id number] DOB: [birth year] Today's Date: 12/11/2018    History of Present Illness Pt admitted for dehydration. Pt complains of weakness/fever. History includes Parkinson's, DM, HTN, seizures, and CVA.   Clinical Impression   Pt seen for OT evaluation this date. Prior to hospital admission, pt was relatively independent up until about 1 month ago, per pt's wife. Pt was able to perform ADL tasks without assist but pt reports ADL becoming more difficult to complete on his own. Wife reports multiple recent falls and pt attributes this to his legs "giving out." Currently pt demonstrates impairments in strength, activity tolerance, balance, and requiring increased assist for ADL and functional mobility. Pt/wife educated in use of AE/DME to improve safety/independence with bathing tasks and toileting. Pt/wife verbalized understanding. Pt would benefit from skilled OT to address noted impairments and functional limitations (see below for any additional details) in order to maximize safety and independence while minimizing falls risk and caregiver burden.  Upon hospital discharge, recommend pt discharge to [place] prior to return home to maximize safety and improve strength/balance in order to minimize falls risk and risk of readmission.    Follow Up Recommendations  SNF    Equipment Recommendations  3 in 1 bedside commode;Other (comment);Tub/shower bench(handheld shower head, grab bars installed into shower wall)    Recommendations for Other Services       Precautions / Restrictions Precautions Precautions: Fall Restrictions Weight Bearing Restrictions: No      Mobility Bed Mobility Overal bed mobility: Needs Assistance Bed Mobility: Supine to Sit     Supine to sit: Mod assist     General bed mobility comments: deferred, pt seated EOB with nurse tech at start of session, in recliner at end of  session  Transfers Overall transfer level: Needs assistance Equipment used: Rolling walker (2 wheeled) Transfers: Sit to/from Stand Sit to Stand: Min assist;Mod assist         General transfer comment: bed elevated for transfer. RW used with cues for hand placement prior to attempt. Once standing, needs cues to keep RW close to body    Balance Overall balance assessment: Needs assistance;History of Falls Sitting-balance support: Feet supported Sitting balance-Leahy Scale: Poor Sitting balance - Comments: heavy post lean present   Standing balance support: Bilateral upper extremity supported Standing balance-Leahy Scale: Fair                             ADL either performed or assessed with clinical judgement   ADL Overall ADL's : Needs assistance/impaired Eating/Feeding: Set up;Sitting   Grooming: Sitting;Set up   Upper Body Bathing: Sitting;Minimal assistance   Lower Body Bathing: Sit to/from stand;Moderate assistance;Minimal assistance   Upper Body Dressing : Sitting;Set up;Min guard   Lower Body Dressing: Sit to/from stand;Moderate assistance;Minimal assistance   Toilet Transfer: Minimal assistance;Min guard;RW;BSC                   Vision Patient Visual Report: No change from baseline Vision Assessment?: No apparent visual deficits     Perception     Praxis      Pertinent Vitals/Pain Pain Assessment: No/denies pain     Hand Dominance Right   Extremity/Trunk Assessment Upper Extremity Assessment Upper Extremity Assessment: Generalized weakness(grossly 4/5 bilat; wife reports occasional BUE tremors, none noted during assessment)   Lower Extremity Assessment Lower Extremity Assessment: Defer to PT evaluation;Generalized weakness(grossly 3+/5 bilat)  Communication Communication Communication: No difficulties   Cognition Arousal/Alertness: Awake/alert Behavior During Therapy: WFL for tasks assessed/performed Overall Cognitive  Status: Within Functional Limits for tasks assessed                                 General Comments: appears confused at times, otherwise alert. HOH   General Comments       Exercises Exercises: Other exercises Other Exercises Other Exercises: Pt/wife instructed in use of AE/DME to improve safety/independence with bathing tasks and toileting tasks - may benefit from handheld showerhead and tub transfer bench to improve safety with tub transfers   Shoulder Instructions      Home Living Family/patient expects to be discharged to:: Private residence Living Arrangements: Spouse/significant other Available Help at Discharge: Family Type of Home: House Home Access: Stairs to enter Technical brewer of Steps: 3 Entrance Stairs-Rails: Can reach both Home Layout: Two level;Able to live on main level with bedroom/bathroom     Bathroom Shower/Tub: Teacher, early years/pre: Standard     Home Equipment: Cane - single point;Grab bars - tub/shower   Additional Comments: has suction grab bars, recently attempted to use and it came off the wall when pt fell      Prior Functioning/Environment Level of Independence: Independent        Comments: was previously independent, however per wife was having multiple falls recently. Not using AD        OT Problem List: Decreased strength;Decreased activity tolerance;Impaired balance (sitting and/or standing);Decreased knowledge of use of DME or AE      OT Treatment/Interventions: Self-care/ADL training;Therapeutic exercise;Therapeutic activities;Neuromuscular education;DME and/or AE instruction;Patient/family education;Balance training    OT Goals(Current goals can be found in the care plan section) Acute Rehab OT Goals Patient Stated Goal: to go home OT Goal Formulation: With patient/family Time For Goal Achievement: 12/25/18 Potential to Achieve Goals: Good ADL Goals Pt Will Perform Lower Body Dressing: with  min assist;sit to/from stand(with or without AE) Pt Will Transfer to Toilet: with supervision;ambulating(BSC over toilet, LRAD for ambn) Pt Will Perform Tub/Shower Transfer: with min guard assist;tub bench;rolling walker;ambulating  OT Frequency: Min 2X/week   Barriers to D/C:            Co-evaluation              AM-PAC OT "6 Clicks" Daily Activity     Outcome Measure Help from another person eating meals?: None Help from another person taking care of personal grooming?: None Help from another person toileting, which includes using toliet, bedpan, or urinal?: A Little Help from another person bathing (including washing, rinsing, drying)?: A Lot Help from another person to put on and taking off regular upper body clothing?: A Little Help from another person to put on and taking off regular lower body clothing?: A Lot 6 Click Score: 18   End of Session    Activity Tolerance: Patient tolerated treatment well Patient left: in chair;with call bell/phone within reach;with chair alarm set;with family/visitor present;with SCD's reapplied  OT Visit Diagnosis: Other abnormalities of gait and mobility (R26.89);Repeated falls (R29.6);Muscle weakness (generalized) (M62.81)                Time: [id number] OT Time Calculation (min): 22 min Charges:  OT General Charges $OT Visit: 1 Visit OT Evaluation $OT Eval Low Complexity: 1 Low OT Treatments $Self Care/Home Management : 8-22 mins  Jeni Salles, MPH, MS, OTR/L  ascom [phone] 12/11/18, 3:16 PM

## 2018-12-12 LAB — URINE CULTURE

## 2018-12-12 LAB — GLUCOSE, CAPILLARY
Glucose-Capillary: 87 mg/dL (ref 70–99)
Glucose-Capillary: 163 mg/dL — ABNORMAL HIGH (ref 70–99)
Glucose-Capillary: 119 mg/dL — ABNORMAL HIGH (ref 70–99)
Glucose-Capillary: 133 mg/dL — ABNORMAL HIGH (ref 70–99)

## 2018-12-12 NOTE — Progress Notes (Signed)
Physical Therapy Treatment Patient Details Name: Gilbert CAMPISE Sr. MRN: [id number] DOB: 1941-01-23 Today's Date: 12/12/2018    History of Present Illness Pt admitted for dehydration. Pt complains of weakness/fever. History includes Parkinson's, DM, HTN, seizures, and CVA.    PT Comments    Pt is making good progress towards goals with increased ambulation distance this session. Still somewhat confused and has difficulty following commands/tasks. Able to ambulate in hallway with close chair follow for safety/endurance. Needs heavy cues for gait technique as he demonstrates short shuffled gait pattern. Good endurance with there-ex. Needs hands on assist for all mobility, high falls risk. Recommend continued use of RW at all time to improve balance and decrease falls. Pt is currently not at baseline level, continue to recommend SNF.   Follow Up Recommendations  SNF     Equipment Recommendations  Rolling walker with 5" wheels    Recommendations for Other Services       Precautions / Restrictions Precautions Precautions: Fall Restrictions Weight Bearing Restrictions: No    Mobility  Bed Mobility Overal bed mobility: Needs Assistance Bed Mobility: Supine to Sit     Supine to sit: Mod assist     General bed mobility comments: has difficulty sliding B LEs towards EOB and sequencing. Has difficulty trunk stability. Once sitting, needs min assist for seated balance, able to progress to cga  Transfers Overall transfer level: Needs assistance Equipment used: Rolling walker (2 wheeled) Transfers: Sit to/from Stand Sit to Stand: Min assist         General transfer comment: needs assist for sequencing. Heavy verbal cues for hand placement. Once standing, able to stand with upright posture  Ambulation/Gait Ambulation/Gait assistance: Min assist Gait Distance (Feet): 100 Feet Assistive device: Rolling walker (2 wheeled) Gait Pattern/deviations: Step-through pattern      General Gait Details: ambulated in hallway with close chair follow. Short shuffle gait pattern performed, however able to demonstrate improved step length with cues. Unsteadiness with turns. Fatigues with increased endurance   Stairs             Wheelchair Mobility    Modified Rankin (Stroke Patients Only)       Balance Overall balance assessment: Needs assistance;History of Falls Sitting-balance support: Feet supported Sitting balance-Leahy Scale: Fair     Standing balance support: Bilateral upper extremity supported Standing balance-Leahy Scale: Fair                              Cognition Arousal/Alertness: Awake/alert Behavior During Therapy: WFL for tasks assessed/performed Overall Cognitive Status: Within Functional Limits for tasks assessed(slightly confused to situation)                                        Exercises Other Exercises Other Exercises: supine ther-ex performed including B LE AP, hip abd/add, and heel slides x 10 reps with supervision and cues for sequencing.    General Comments        Pertinent Vitals/Pain Pain Assessment: No/denies pain    Home Living                      Prior Function            PT Goals (current goals can now be found in the care plan section) Acute Rehab PT Goals Patient Stated Goal: to go  home PT Goal Formulation: With patient Time For Goal Achievement: 12/25/18 Potential to Achieve Goals: Good Progress towards PT goals: Progressing toward goals    Frequency    Min 2X/week      PT Plan Current plan remains appropriate    Co-evaluation              AM-PAC PT "6 Clicks" Mobility   Outcome Measure  Help needed turning from your back to your side while in a flat bed without using bedrails?: A Little Help needed moving from lying on your back to sitting on the side of a flat bed without using bedrails?: A Little Help needed moving to and from a bed to a  chair (including a wheelchair)?: A Little Help needed standing up from a chair using your arms (e.g., wheelchair or bedside chair)?: A Little Help needed to walk in hospital room?: A Little Help needed climbing 3-5 steps with a railing? : A Lot 6 Click Score: 17    End of Session Equipment Utilized During Treatment: Gait belt Activity Tolerance: Patient tolerated treatment well Patient left: in chair;with chair alarm set Nurse Communication: Mobility status PT Visit Diagnosis: Muscle weakness (generalized) (M62.81);Difficulty in walking, not elsewhere classified (R26.2);Unsteadiness on feet (R26.81);History of falling (Z91.81)     Time: [id number] PT Time Calculation (min) (ACUTE ONLY): 32 min  Charges:  $Gait Training: 8-22 mins $Therapeutic Exercise: 8-22 mins                     Gilbert [name], PT, DPT [phone]    Gilbert [name] 12/12/2018, 1:12 PM

## 2018-12-12 NOTE — Progress Notes (Signed)
OT Cancellation Note  Patient Details Name: Gilbert SCALA Sr. MRN: [id number] DOB: June 20, 1940   Cancelled Treatment:    Reason Eval/Treat Not Completed: Patient declined, no reason specified. Upon attempt, pt napping in bed and wife napping in the recliner. Pt and wife politely decline any OT needs at this time. Therapist was able to answer a question the wife had about a tub transfer bench. Pt/wife agreeable to OT re-attempting treatment next date.   Jeni Salles, MPH, MS, OTR/L ascom [phone] 12/12/18, 2:50 PM

## 2018-12-12 NOTE — Progress Notes (Signed)
[facility] [place] NAME: Gilbert [name]    MR#:  [id number]  DATE OF BIRTH:  [birth year]  SUBJECTIVE:   Patient came in with nausea vomiting diarrhea and poor appetite. No more diarrhea. Patient had low-grade fever of 101's morning. He is insisting he wants to go home. He feels a lot better. He ate good dinner. REVIEW OF SYSTEMS:   Review of Systems  Constitutional: Positive for malaise/fatigue. Negative for chills, fever and weight loss.  HENT: Negative for ear discharge, ear pain and nosebleeds.   Eyes: Negative for blurred vision, pain and discharge.  Respiratory: Negative for sputum production, shortness of breath, wheezing and stridor.   Cardiovascular: Negative for chest pain, palpitations, orthopnea and PND.  Gastrointestinal: Negative for abdominal pain, diarrhea, nausea and vomiting.  Genitourinary: Negative for frequency and urgency.  Musculoskeletal: Negative for back pain and joint pain.  Neurological: Positive for weakness. Negative for sensory change, speech change and focal weakness.  Psychiatric/Behavioral: Negative for depression and hallucinations. The patient is not nervous/anxious.    Tolerating Diet:poor Tolerating PT: rehab  DRUG ALLERGIES:  No Known Allergies  VITALS:  Blood pressure (!) 158/54, pulse 73, temperature (!) 101.9 F (38.8 C), resp. rate 19, height 5\' 11"  (1.803 m), weight 86.2 kg, SpO2 93 %.  PHYSICAL EXAMINATION:   Physical Exam  GENERAL:  78 y.o.-year-old patient lying in the bed with no acute distress.  EYES: Pupils equal, round, reactive to light and accommodation. No scleral icterus. Extraocular muscles intact.  HEENT: Head atraumatic, normocephalic. Oropharynx and nasopharynx clear.  NECK:  Supple, no jugular venous distention. No thyroid enlargement, no tenderness.  LUNGS: Normal breath sounds bilaterally, no wheezing, rales, rhonchi. No use of accessory muscles of respiration.   CARDIOVASCULAR: S1, S2 normal. No murmurs, rubs, or gallops.  ABDOMEN: Soft, nontender, nondistended. Bowel sounds present. No organomegaly or mass.  EXTREMITIES: No cyanosis, clubbing or edema b/l.    NEUROLOGIC: Cranial nerves II through XII are intact. No focal Motor or sensory deficits b/l.  weakness PSYCHIATRIC:  patient is alert and oriented x 3.  SKIN: No obvious rash, lesion, or ulcer.   LABORATORY PANEL:  CBC Recent Labs  Lab 12/11/18 0203  WBC 9.7  HGB 10.9*  HCT 33.7*  PLT 128*    Chemistries  Recent Labs  Lab 12/10/18 1751 12/11/18 0203  NA 132* 133*  K 3.5 3.5  CL 94* 96*  CO2 21* 24  GLUCOSE 259* 241*  BUN 31* 27*  CREATININE 1.85* 1.71*  CALCIUM 8.0* 7.6*  AST 22  --   ALT 9  --   ALKPHOS 102  --   BILITOT 1.1  --    Cardiac Enzymes No results for input(s): TROPONINI in the last 168 hours. RADIOLOGY:  Dg Chest Port 1 View  Result Date: 12/10/2018 CLINICAL DATA:  [place] last night with bruising on his back. Weakness. Fever. EXAM: PORTABLE CHEST 1 VIEW COMPARISON:  None. FINDINGS: Enlarged cardiac silhouette. Mildly prominent pulmonary vasculature and interstitial markings. No pleural fluid. No fracture or pneumothorax seen. Tortuous and calcified thoracic aorta. IMPRESSION: 1. Cardiomegaly and mild pulmonary vascular congestion. 2. Mild chronic interstitial lung disease. Electronically Signed   By: Claudie Revering M.D.   On: 12/10/2018 18:19   ASSESSMENT AND PLAN:  Gilbert [name]  is a 79 y.o. male with a known history of Parkinson's disease, diabetes, hypertension, seizures, CVA.  He presented to the emergency room accompanied by his wife with complaints  of generalized weakness with nausea and vomiting as well as recent diarrhea.    1.  Dehydration-- acute on chronic kidney failure. Baseline creatinine around 1.6 - Possibly secondary to infection of unknown source - Patient is receiving normal saline at 100 cc/h to peripheral IV for fluid resuscitation -   creatinine improving. Patient encourage oral intake.  2.  Acute kidney injury -Secondary to #1 - received Fluid resuscitation  3.  Acute gastroenteritis - With infection of unknown source -d/c broad-spectrum antibiotic. - GI panel and C. difficile pending-- no diarrhea documented--will send stool sample if diarrhea recurs - We will treat symptomatically with antiemetic for nausea vomiting and antidiarrheals  - Blood culture negative -source of fever ?viral  4.  Diabetes mellitus - Moderate sliding scale insulin  5.  Generalized weakness with history of Parkinson's disease - PT and OT consulted for supportive care  DVT and PPI prophylaxis initiated  Physical therapy recommends rehab. Patient is insisting he wants to go home. wife is agreeable since there is no was it allowed for family members and the facilities they prefer going home.  Wants to go home today. Discussed with patient if he remains afebrile till afternoon will consider discharge later today.   CODE STATUS: full  DVT Prophylaxis: lovnoex  TOTAL TIME TAKING CARE OF THIS PATIENT: 25* minutes.  >50% time spent on counselling and coordination of care  POSSIBLE D/C IN  today DEPENDING ON CLINICAL CONDITION.  Note: This dictation was prepared with Dragon dictation along with smaller phrase technology. Any transcriptional errors that result from this process are unintentional.  Fritzi Mandes M.D on 12/12/2018 at 7:50 AM  Between 7am to 6pm - Pager - [phone]  After 6pm go to [email or web address] - password [email or web address] [facility]  Office  [phone]  CC: Primary care physician; [name], MDPatient ID: Gilbert Finner Sr., male   DOB: 1940-12-29, 78 y.o.   MRN: [id number]

## 2018-12-12 NOTE — TOC Progression Note (Addendum)
Transition of Care (TOC) - Progression Note    Patient Details  Name: Gilbert GAUNCE Sr. MRN: [id number] Date of Birth: [birth year]  Transition of Care [facility]) CM/SW Contact  Batu Cassin, Lenice Llamas Phone Number: [phone]  12/12/2018, 12:19 PM  Clinical Narrative: Clinical Social Worker (CSW) met with patient and his wife Gilbert [name] was at bedside. CSW presented SNF bed offers to wife and provided CMS SNF list to wife. Per patient and wife they have decided that patient will go home from [facility] because they do not want him to be in a SNF with no visitors allowed. CSW explained that all SNFs have a no visitor policy at this time due to covid 19. Patient and wife verbalized their understanding and prefer home health. CSW provided wife with CMS home health list. Per wife she does not have a preference of home health agency. CSW contacted [facility] agency representative and asked him to review referral. Per wife patient will also need a rolling walker because he does not have a walker at home. CSW made [facility] agency representative aware of need for rolling walker. RN and MD aware of above. CSW will continue to follow and assist as needed.   2:15 pm: Per [facility] representative they can accept patient. Patient will need home health PT, OT and aide. Patient's rolling walker at been delivered to the room. Patient and his wife Gilbert [name] are aware of above.     Expected Discharge Plan: [place] Barriers to Discharge: Continued Medical Work up  Expected Discharge Plan and Services Expected Discharge Plan: [place] In-house Referral: Clinical Social Work Discharge Planning Services: CM Consult Post Acute Care Choice: [place] arrangements for the past 2 months: Single Family Home                                       Social Determinants of Health (SDOH) Interventions    Readmission Risk  Interventions No flowsheet data found.

## 2018-12-13 LAB — GLUCOSE, CAPILLARY
Glucose-Capillary: 79 mg/dL (ref 70–99)
Glucose-Capillary: 161 mg/dL — ABNORMAL HIGH (ref 70–99)

## 2018-12-13 NOTE — Care Management Important Message (Signed)
Important Message  Patient Details  Name: Gilbert [name]. MRN: [id number] Date of Birth: July 07, 1940   Medicare Important Message Given:  Yes     Juliann Pulse A Evie Crumpler 12/13/2018, 10:52 AM

## 2018-12-13 NOTE — Discharge Summary (Signed)
[facility] NAME: Kaynin Brazill    MR#:  [id number]  DATE OF BIRTH:  [birth year]  DATE OF ADMISSION:  12/10/2018 ADMITTING PHYSICIAN: Otila Back, MD  DATE OF DISCHARGE: 12/13/2018  PRIMARY CARE PHYSICIAN: [name], MD    ADMISSION DIAGNOSIS:  Weakness [R53.1] Fever, unspecified fever cause [R50.9]  DISCHARGE DIAGNOSIS:  Febrile illnesss --Viral syndrome-- source of infection identified nausea vomiting diarrhea/acute gastroenteritis resolved SECONDARY DIAGNOSIS:   Past Medical History:  Diagnosis Date  . Diabetes mellitus without complication ([place])   . Hypertension   . Parkinson disease [facility])     HOSPITAL COURSE:  JohnNisewonderis a56 y.o.malewith a known history of Parkinson's disease, diabetes, hypertension, seizures, CVA. He presented to the emergency room accompanied by his wife with complaints of generalized weakness with nausea and vomiting as well as recent diarrhea.    1.Dehydration-- acute on chronic kidney failure. Baseline creatinine around 1.6 -no source of infection identified -received IV fluids - creatinine improving. Patient encourage oral intake. Grading PO diet. No diarrhea  2. Acute kidney injury -Secondary to #1 -received Fluid resuscitation  3. Acute gastroenteritis-viral -With infection of unknown source -d/c broad-spectrum antibiotic. -GI panel and C. difficile pending-- no diarrhea documented--will send stool sample if diarrhea recurs -We will treat symptomatically with antiemetic for nausea vomiting and antidiarrheals  -Blood culture negative -source of fever ?viral  4. Diabetes mellitus -Moderate sliding scale insulin  5. Generalized weakness with history of Parkinson's disease -PT and OT consulted for supportive care  DVT and PPI prophylaxis initiated  Physical therapy recommends rehab. Patient is insisting he wants to go home. wife is  agreeable since there is no was it allowed for family members and the facilities they prefer going home.  discussed with wife in the room. Discharge today to home. Wife and patient agreeable CONSULTS OBTAINED:    DRUG ALLERGIES:  No Known Allergies  DISCHARGE MEDICATIONS:   Allergies as of 12/13/2018   No Known Allergies     Medication List    STOP taking these medications   clopidogrel 75 MG tablet Commonly known as: PLAVIX     TAKE these medications   amLODipine 10 MG tablet Commonly known as: NORVASC Take 1 tablet (10 mg total) by mouth daily.   aspirin 81 MG EC tablet Take 1 tablet (81 mg total) by mouth daily.   atenolol 50 MG tablet Commonly known as: TENORMIN Take 50 mg by mouth daily. What changed: Another medication with the same name was removed. Continue taking this medication, and follow the directions you see here.   atorvastatin 80 MG tablet Commonly known as: LIPITOR Take 40 mg by mouth daily. What changed: Another medication with the same name was removed. Continue taking this medication, and follow the directions you see here.   carbidopa-levodopa 25-100 MG tablet Commonly known as: SINEMET IR Take 1 tablet by mouth 3 (three) times daily.   divalproex 500 MG 24 hr tablet Commonly known as: DEPAKOTE ER Take 1 tablet by mouth 2 (two) times daily. What changed: Another medication with the same name was removed. Continue taking this medication, and follow the directions you see here.   donepezil 10 MG tablet Commonly known as: ARICEPT Take 10 mg by mouth daily.   hydrALAZINE 50 MG tablet Commonly known as: APRESOLINE Take 50 mg by mouth 3 (three) times daily.   insulin aspart protamine- aspart (70-30) 100 UNIT/ML injection Commonly known as: NOVOLOG MIX 70/30 Inject  30 Units into the skin 2 (two) times daily with a meal.   levothyroxine 75 MCG tablet Commonly known as: SYNTHROID Take 75 mcg by mouth daily before breakfast.   lisinopril 40  MG tablet Commonly known as: ZESTRIL Take 1 tablet (40 mg total) by mouth at bedtime.   vitamin C 500 MG tablet Commonly known as: ASCORBIC ACID Take 1,000 mg by mouth daily.       If you experience worsening of your admission symptoms, develop shortness of breath, life threatening emergency, suicidal or homicidal thoughts you must seek medical attention immediately by calling 911 or calling your MD immediately  if symptoms less severe.  You Must read complete instructions/literature along with all the possible adverse reactions/side effects for all the Medicines you take and that have been prescribed to you. Take any new Medicines after you have completely understood and accept all the possible adverse reactions/side effects.   Please note  You were cared for by a hospitalist during your hospital stay. If you have any questions about your discharge medications or the care you received while you were in the hospital after you are discharged, you can call the unit and asked to speak with the hospitalist on call if the hospitalist that took care of you is not available. Once you are discharged, your primary care physician will handle any further medical issues. Please note that NO REFILLS for any discharge medications will be authorized once you are discharged, as it is imperative that you return to your primary care physician (or establish a relationship with a primary care physician if you do not have one) for your aftercare needs so that they can reassess your need for medications and monitor your lab values. Today   SUBJECTIVE   Doing well. Tolerating diet.  VITAL SIGNS:  Blood pressure (!) 148/84, pulse 64, temperature 98.8 F (37.1 C), temperature source Oral, resp. rate 19, height 5\' 11"  (1.803 m), weight 86.2 kg, SpO2 96 %.  I/O:    Intake/Output Summary (Last 24 hours) at 12/13/2018 1331 Last data filed at 12/13/2018 0900 Gross per 24 hour  Intake 480 ml  Output -  Net 480 ml     PHYSICAL EXAMINATION:  GENERAL:  78 y.o.-year-old patient lying in the bed with no acute distress.  EYES: Pupils equal, round, reactive to light and accommodation. No scleral icterus. Extraocular muscles intact.  HEENT: Head atraumatic, normocephalic. Oropharynx and nasopharynx clear.  NECK:  Supple, no jugular venous distention. No thyroid enlargement, no tenderness.  LUNGS: Normal breath sounds bilaterally, no wheezing, rales,rhonchi or crepitation. No use of accessory muscles of respiration.  CARDIOVASCULAR: S1, S2 normal. No murmurs, rubs, or gallops.  ABDOMEN: Soft, non-tender, non-distended. Bowel sounds present. No organomegaly or mass.  EXTREMITIES: No pedal edema, cyanosis, or clubbing.  NEUROLOGIC: Cranial nerves II through XII are intact. Muscle strength 5/5 in all extremities. Sensation intact. Gait not checked.  PSYCHIATRIC: The patient is alert and oriented x 3.  SKIN: No obvious rash, lesion, or ulcer.   DATA REVIEW:   CBC  Recent Labs  Lab 12/11/18 0203  WBC 9.7  HGB 10.9*  HCT 33.7*  PLT 128*    Chemistries  Recent Labs  Lab 12/10/18 1751 12/11/18 0203  NA 132* 133*  K 3.5 3.5  CL 94* 96*  CO2 21* 24  GLUCOSE 259* 241*  BUN 31* 27*  CREATININE 1.85* 1.71*  CALCIUM 8.0* 7.6*  AST 22  --   ALT 9  --  ALKPHOS 102  --   BILITOT 1.1  --     Microbiology Results   Recent Results (from the past 240 hour(s))  Blood Culture (routine x 2)     Status: None (Preliminary result)   Collection Time: 12/10/18  5:51 PM   Specimen: BLOOD  Result Value Ref Range Status   Specimen Description BLOOD LEFT ANTECUBITAL  Final   Special Requests   Final    BOTTLES DRAWN AEROBIC AND ANAEROBIC Blood Culture results may not be optimal due to an excessive volume of blood received in culture bottles   Culture   Final    NO GROWTH 3 DAYS Performed at [facility], [address] [place], [place] [address]    Report Status PENDING  Incomplete  Blood  Culture (routine x 2)     Status: None (Preliminary result)   Collection Time: 12/10/18  5:51 PM   Specimen: BLOOD  Result Value Ref Range Status   Specimen Description BLOOD BLOOD RIGHT FOREARM  Final   Special Requests   Final    BOTTLES DRAWN AEROBIC AND ANAEROBIC Blood Culture adequate volume   Culture   Final    NO GROWTH 3 DAYS Performed at [facility], [address] [place], [place] [address]    Report Status PENDING  Incomplete  SARS Coronavirus 2 [facility] order, Performed in [place] hospital lab) Nasopharyngeal Nasopharyngeal Swab     Status: None   Collection Time: 12/10/18  5:51 PM   Specimen: Nasopharyngeal Swab  Result Value Ref Range Status   SARS Coronavirus 2 NEGATIVE NEGATIVE Final    Comment: Performed at [facility], [address] [place], [place] [address]  Urine culture     Status: Abnormal   Collection Time: 12/10/18  5:57 PM   Specimen: In/Out Cath Urine  Result Value Ref Range Status   Specimen Description   Final    IN/OUT CATH URINE Performed at [facility], [address] [place], [place] [address]    Special Requests   Final    NONE Performed at [facility], [address] [place], [place] [address]    Culture MULTIPLE SPECIES PRESENT, SUGGEST RECOLLECTION (A)  Final   Report Status 12/12/2018 FINAL  Final    RADIOLOGY:  No results found.   CODE STATUS:     Code Status Orders  (From admission, onward)         Start     Ordered   12/10/18 2123  Full code  Continuous     12/10/18 2122        Code Status History    Date Active Date Inactive Code Status Order ID Comments User Context   05/05/2016 1932 05/10/2016 1440 Full Code [id number]  Loletha Grayer, MD ED   Advance Care Planning Activity      TOTAL TIME TAKING CARE OF THIS PATIENT: 40 minutes.    Fritzi Mandes M.D on 12/13/2018 at 1:31 PM  Between 7am to 6pm - Pager - [phone] After 6pm go to [email or web address] - password  [facility]  Office  [phone]  CC: Primary care physician; [name], MD

## 2018-12-13 NOTE — TOC Transition Note (Signed)
Transition of Care [facility]) - CM/SW Discharge Note   Patient Details  Name: LAURENS SAHAI Sr. MRN: [id number] Date of Birth: Sep 29, 1940  Transition of Care [facility]) CM/SW Contact:  Giulliana Mcroberts, Lenice Llamas Phone Number: [phone]  12/13/2018, 1:58 PM   Clinical Narrative: Clinical Social Worker ([place]) notified [place] representative that patient will D/C home today. [employer] agency representative delivered rolling walker to room. Please reconsult if future social work needs arise. CSW signing off.     Final next level of care: [facility] Barriers to Discharge: Barriers Resolved   Patient Goals and CMS Choice Patient states their goals for this hospitalization and ongoing recovery are:: To go home. CMS Medicare.gov Compare Post Acute Care list provided to:: Patient Represenative (must comment)([name] Constance Holster.) Choice offered to / list presented to : Spouse  Discharge Placement                       Discharge Plan and Services In-house Referral: Clinical Social Work Discharge Planning Services: CM Consult Post Acute Care Choice: [place]          DME Arranged: Gilford Rile rolling DME Agency: [employer] Date DME Agency Contacted: 12/12/18 Time DME Agency Contacted: 1137 Representative spoke with at DME Agency: [place]: RN, PT, OT, Nurse's Aide [place] Agency: [place] ([place]) Date [place]: 12/13/18 Time [place]: 1130 Representative spoke with at [place]: [place] ([place]) Interventions     Readmission Risk Interventions No flowsheet data found.

## 2018-12-13 NOTE — Progress Notes (Signed)
Patient assisted with dressing by RN and NT. Patient wheeled to front lobby and assisted into the car. Belongings including rolling walker placed in car with patient.

## 2018-12-15 DIAGNOSIS — A09 Infectious gastroenteritis and colitis, unspecified: Secondary | ICD-10-CM | POA: Diagnosis not present

## 2018-12-15 DIAGNOSIS — N289 Disorder of kidney and ureter, unspecified: Secondary | ICD-10-CM | POA: Diagnosis not present

## 2018-12-15 DIAGNOSIS — Z7982 Long term (current) use of aspirin: Secondary | ICD-10-CM | POA: Diagnosis not present

## 2018-12-15 DIAGNOSIS — M6281 Muscle weakness (generalized): Secondary | ICD-10-CM | POA: Diagnosis not present

## 2018-12-15 DIAGNOSIS — E119 Type 2 diabetes mellitus without complications: Secondary | ICD-10-CM | POA: Diagnosis not present

## 2018-12-15 DIAGNOSIS — G20 Parkinson's disease: Secondary | ICD-10-CM | POA: Diagnosis not present

## 2018-12-15 DIAGNOSIS — E538 Deficiency of other specified B group vitamins: Secondary | ICD-10-CM | POA: Diagnosis not present

## 2018-12-15 DIAGNOSIS — I10 Essential (primary) hypertension: Secondary | ICD-10-CM | POA: Diagnosis not present

## 2018-12-15 DIAGNOSIS — N179 Acute kidney failure, unspecified: Secondary | ICD-10-CM | POA: Diagnosis not present

## 2018-12-15 DIAGNOSIS — G40909 Epilepsy, unspecified, not intractable, without status epilepticus: Secondary | ICD-10-CM | POA: Diagnosis not present

## 2018-12-15 DIAGNOSIS — R972 Elevated prostate specific antigen [PSA]: Secondary | ICD-10-CM | POA: Diagnosis not present

## 2018-12-15 DIAGNOSIS — Z794 Long term (current) use of insulin: Secondary | ICD-10-CM | POA: Diagnosis not present

## 2018-12-15 DIAGNOSIS — A048 Other specified bacterial intestinal infections: Secondary | ICD-10-CM | POA: Diagnosis not present

## 2018-12-15 DIAGNOSIS — E1151 Type 2 diabetes mellitus with diabetic peripheral angiopathy without gangrene: Secondary | ICD-10-CM | POA: Diagnosis not present

## 2018-12-15 DIAGNOSIS — D500 Iron deficiency anemia secondary to blood loss (chronic): Secondary | ICD-10-CM | POA: Diagnosis not present

## 2018-12-15 DIAGNOSIS — Z8673 Personal history of transient ischemic attack (TIA), and cerebral infarction without residual deficits: Secondary | ICD-10-CM | POA: Diagnosis not present

## 2018-12-15 DIAGNOSIS — E860 Dehydration: Secondary | ICD-10-CM | POA: Diagnosis not present

## 2018-12-15 LAB — CULTURE, BLOOD (ROUTINE X 2)
Culture: NO GROWTH
Culture: NO GROWTH
Special Requests: ADEQUATE

## 2018-12-18 DIAGNOSIS — A09 Infectious gastroenteritis and colitis, unspecified: Secondary | ICD-10-CM | POA: Diagnosis not present

## 2019-01-03 DIAGNOSIS — A09 Infectious gastroenteritis and colitis, unspecified: Secondary | ICD-10-CM | POA: Diagnosis not present

## 2019-01-03 DIAGNOSIS — Z125 Encounter for screening for malignant neoplasm of prostate: Secondary | ICD-10-CM | POA: Diagnosis not present

## 2019-01-03 DIAGNOSIS — N183 Chronic kidney disease, stage 3 (moderate): Secondary | ICD-10-CM | POA: Diagnosis not present

## 2019-01-03 DIAGNOSIS — E1151 Type 2 diabetes mellitus with diabetic peripheral angiopathy without gangrene: Secondary | ICD-10-CM | POA: Diagnosis not present

## 2019-01-17 DIAGNOSIS — N183 Chronic kidney disease, stage 3 (moderate): Secondary | ICD-10-CM | POA: Diagnosis not present

## 2019-01-17 DIAGNOSIS — E860 Dehydration: Secondary | ICD-10-CM | POA: Diagnosis not present

## 2019-01-17 DIAGNOSIS — R972 Elevated prostate specific antigen [PSA]: Secondary | ICD-10-CM | POA: Diagnosis not present

## 2019-01-17 DIAGNOSIS — D500 Iron deficiency anemia secondary to blood loss (chronic): Secondary | ICD-10-CM | POA: Diagnosis not present

## 2019-01-23 DIAGNOSIS — G40909 Epilepsy, unspecified, not intractable, without status epilepticus: Secondary | ICD-10-CM | POA: Diagnosis not present

## 2019-01-23 DIAGNOSIS — G20 Parkinson's disease: Secondary | ICD-10-CM | POA: Diagnosis not present

## 2019-01-23 DIAGNOSIS — R4189 Other symptoms and signs involving cognitive functions and awareness: Secondary | ICD-10-CM | POA: Diagnosis not present

## 2019-01-29 DIAGNOSIS — E860 Dehydration: Secondary | ICD-10-CM | POA: Diagnosis not present

## 2019-01-29 DIAGNOSIS — G20 Parkinson's disease: Secondary | ICD-10-CM | POA: Diagnosis not present

## 2019-01-29 DIAGNOSIS — G40909 Epilepsy, unspecified, not intractable, without status epilepticus: Secondary | ICD-10-CM | POA: Diagnosis not present

## 2019-01-29 DIAGNOSIS — Z8673 Personal history of transient ischemic attack (TIA), and cerebral infarction without residual deficits: Secondary | ICD-10-CM | POA: Diagnosis not present

## 2019-01-29 DIAGNOSIS — N179 Acute kidney failure, unspecified: Secondary | ICD-10-CM | POA: Diagnosis not present

## 2019-01-29 DIAGNOSIS — E119 Type 2 diabetes mellitus without complications: Secondary | ICD-10-CM | POA: Diagnosis not present

## 2019-01-29 DIAGNOSIS — I10 Essential (primary) hypertension: Secondary | ICD-10-CM | POA: Diagnosis not present

## 2019-01-29 DIAGNOSIS — M6281 Muscle weakness (generalized): Secondary | ICD-10-CM | POA: Diagnosis not present

## 2019-03-01 DIAGNOSIS — K625 Hemorrhage of anus and rectum: Secondary | ICD-10-CM | POA: Diagnosis not present

## 2019-03-01 DIAGNOSIS — R1032 Left lower quadrant pain: Secondary | ICD-10-CM | POA: Diagnosis not present

## 2019-03-01 DIAGNOSIS — R972 Elevated prostate specific antigen [PSA]: Secondary | ICD-10-CM | POA: Diagnosis not present

## 2019-03-01 DIAGNOSIS — R531 Weakness: Secondary | ICD-10-CM | POA: Diagnosis not present

## 2019-03-06 ENCOUNTER — Other Ambulatory Visit: Payer: Self-pay | Admitting: Gastroenterology

## 2019-03-06 DIAGNOSIS — R1032 Left lower quadrant pain: Secondary | ICD-10-CM

## 2019-03-06 DIAGNOSIS — Z8719 Personal history of other diseases of the digestive system: Secondary | ICD-10-CM | POA: Diagnosis not present

## 2019-03-08 DIAGNOSIS — Z8719 Personal history of other diseases of the digestive system: Secondary | ICD-10-CM | POA: Diagnosis not present

## 2019-03-14 ENCOUNTER — Other Ambulatory Visit: Payer: Self-pay

## 2019-03-14 ENCOUNTER — Other Ambulatory Visit: Payer: Self-pay | Admitting: Gastroenterology

## 2019-03-14 ENCOUNTER — Ambulatory Visit
Admission: RE | Admit: 2019-03-14 | Discharge: 2019-03-14 | Disposition: A | Discharge status: Home / Self Care | Payer: PPO | Source: Ambulatory Visit | Attending: Gastroenterology | Admitting: Gastroenterology

## 2019-03-14 DIAGNOSIS — R935 Abnormal findings on diagnostic imaging of other abdominal regions, including retroperitoneum: Secondary | ICD-10-CM

## 2019-03-14 DIAGNOSIS — K8020 Calculus of gallbladder without cholecystitis without obstruction: Secondary | ICD-10-CM | POA: Diagnosis not present

## 2019-03-14 DIAGNOSIS — R1032 Left lower quadrant pain: Secondary | ICD-10-CM

## 2019-03-14 MED ORDER — IOHEXOL 300 MG/ML  SOLN
75.0000 mL | Freq: Once | INTRAMUSCULAR | Status: AC | PRN
  Administered 2019-03-14: 75 mL via INTRAVENOUS

## 2019-03-21 ENCOUNTER — Ambulatory Visit
Admission: RE | Admit: 2019-03-21 | Discharge: 2019-03-21 | Disposition: A | Discharge status: Home / Self Care | Payer: PPO | Source: Ambulatory Visit | Attending: Gastroenterology | Admitting: Gastroenterology

## 2019-03-21 ENCOUNTER — Other Ambulatory Visit: Payer: Self-pay | Admitting: Gastroenterology

## 2019-03-21 ENCOUNTER — Other Ambulatory Visit: Payer: Self-pay

## 2019-03-21 DIAGNOSIS — R935 Abnormal findings on diagnostic imaging of other abdominal regions, including retroperitoneum: Secondary | ICD-10-CM | POA: Insufficient documentation

## 2019-03-21 DIAGNOSIS — K8020 Calculus of gallbladder without cholecystitis without obstruction: Secondary | ICD-10-CM | POA: Diagnosis not present

## 2019-03-21 MED ORDER — TECHNETIUM TC 99M MEBROFENIN IV KIT
5.0000 | PACK | Freq: Once | INTRAVENOUS | Status: AC | PRN
  Administered 2019-03-21: 5.358 via INTRAVENOUS

## 2019-04-04 DIAGNOSIS — K828 Other specified diseases of gallbladder: Secondary | ICD-10-CM | POA: Diagnosis not present

## 2019-06-10 ENCOUNTER — Other Ambulatory Visit: Payer: Self-pay

## 2019-06-10 ENCOUNTER — Emergency Department: Payer: PPO

## 2019-06-10 ENCOUNTER — Encounter: Payer: Self-pay | Admitting: Emergency Medicine

## 2019-06-10 ENCOUNTER — Inpatient Hospital Stay
Admission: EM | Admit: 2019-06-10 | Discharge: 2019-06-15 | DRG: 418 | Disposition: A | Discharge status: Home / Self Care | Payer: PPO | Attending: Family Medicine | Admitting: Family Medicine

## 2019-06-10 ENCOUNTER — Inpatient Hospital Stay: Payer: PPO

## 2019-06-10 DIAGNOSIS — Z794 Long term (current) use of insulin: Secondary | ICD-10-CM

## 2019-06-10 DIAGNOSIS — K8050 Calculus of bile duct without cholangitis or cholecystitis without obstruction: Secondary | ICD-10-CM | POA: Diagnosis not present

## 2019-06-10 DIAGNOSIS — R2689 Other abnormalities of gait and mobility: Secondary | ICD-10-CM | POA: Diagnosis present

## 2019-06-10 DIAGNOSIS — R17 Unspecified jaundice: Secondary | ICD-10-CM | POA: Diagnosis not present

## 2019-06-10 DIAGNOSIS — N183 Chronic kidney disease, stage 3 unspecified: Secondary | ICD-10-CM | POA: Diagnosis present

## 2019-06-10 DIAGNOSIS — R1010 Upper abdominal pain, unspecified: Secondary | ICD-10-CM

## 2019-06-10 DIAGNOSIS — K660 Peritoneal adhesions (postprocedural) (postinfection): Secondary | ICD-10-CM | POA: Diagnosis not present

## 2019-06-10 DIAGNOSIS — E1169 Type 2 diabetes mellitus with other specified complication: Secondary | ICD-10-CM | POA: Diagnosis not present

## 2019-06-10 DIAGNOSIS — K8012 Calculus of gallbladder with acute and chronic cholecystitis without obstruction: Secondary | ICD-10-CM | POA: Diagnosis not present

## 2019-06-10 DIAGNOSIS — K8044 Calculus of bile duct with chronic cholecystitis without obstruction: Principal | ICD-10-CM | POA: Diagnosis present

## 2019-06-10 DIAGNOSIS — K8010 Calculus of gallbladder with chronic cholecystitis without obstruction: Secondary | ICD-10-CM | POA: Diagnosis present

## 2019-06-10 DIAGNOSIS — R748 Abnormal levels of other serum enzymes: Secondary | ICD-10-CM | POA: Diagnosis present

## 2019-06-10 DIAGNOSIS — K831 Obstruction of bile duct: Secondary | ICD-10-CM | POA: Diagnosis not present

## 2019-06-10 DIAGNOSIS — E11649 Type 2 diabetes mellitus with hypoglycemia without coma: Secondary | ICD-10-CM | POA: Diagnosis not present

## 2019-06-10 DIAGNOSIS — R937 Abnormal findings on diagnostic imaging of other parts of musculoskeletal system: Secondary | ICD-10-CM | POA: Diagnosis not present

## 2019-06-10 DIAGNOSIS — I10 Essential (primary) hypertension: Secondary | ICD-10-CM | POA: Diagnosis not present

## 2019-06-10 DIAGNOSIS — K8051 Calculus of bile duct without cholangitis or cholecystitis with obstruction: Secondary | ICD-10-CM | POA: Diagnosis not present

## 2019-06-10 DIAGNOSIS — Z7189 Other specified counseling: Secondary | ICD-10-CM | POA: Diagnosis not present

## 2019-06-10 DIAGNOSIS — Z7982 Long term (current) use of aspirin: Secondary | ICD-10-CM | POA: Diagnosis not present

## 2019-06-10 DIAGNOSIS — E785 Hyperlipidemia, unspecified: Secondary | ICD-10-CM | POA: Diagnosis present

## 2019-06-10 DIAGNOSIS — Z79899 Other long term (current) drug therapy: Secondary | ICD-10-CM

## 2019-06-10 DIAGNOSIS — Z8673 Personal history of transient ischemic attack (TIA), and cerebral infarction without residual deficits: Secondary | ICD-10-CM | POA: Diagnosis not present

## 2019-06-10 DIAGNOSIS — I160 Hypertensive urgency: Secondary | ICD-10-CM

## 2019-06-10 DIAGNOSIS — I69320 Aphasia following cerebral infarction: Secondary | ICD-10-CM | POA: Diagnosis not present

## 2019-06-10 DIAGNOSIS — Z9181 History of falling: Secondary | ICD-10-CM | POA: Diagnosis not present

## 2019-06-10 DIAGNOSIS — G40909 Epilepsy, unspecified, not intractable, without status epilepticus: Secondary | ICD-10-CM | POA: Diagnosis not present

## 2019-06-10 DIAGNOSIS — R932 Abnormal findings on diagnostic imaging of liver and biliary tract: Secondary | ICD-10-CM | POA: Diagnosis not present

## 2019-06-10 DIAGNOSIS — R1011 Right upper quadrant pain: Secondary | ICD-10-CM | POA: Diagnosis not present

## 2019-06-10 DIAGNOSIS — Z515 Encounter for palliative care: Secondary | ICD-10-CM | POA: Diagnosis not present

## 2019-06-10 DIAGNOSIS — R935 Abnormal findings on diagnostic imaging of other abdominal regions, including retroperitoneum: Secondary | ICD-10-CM | POA: Diagnosis not present

## 2019-06-10 DIAGNOSIS — Z20822 Contact with and (suspected) exposure to covid-19: Secondary | ICD-10-CM | POA: Diagnosis present

## 2019-06-10 DIAGNOSIS — E1122 Type 2 diabetes mellitus with diabetic chronic kidney disease: Secondary | ICD-10-CM | POA: Diagnosis present

## 2019-06-10 DIAGNOSIS — R1033 Periumbilical pain: Secondary | ICD-10-CM | POA: Diagnosis not present

## 2019-06-10 DIAGNOSIS — R109 Unspecified abdominal pain: Secondary | ICD-10-CM

## 2019-06-10 DIAGNOSIS — K8020 Calculus of gallbladder without cholecystitis without obstruction: Secondary | ICD-10-CM

## 2019-06-10 DIAGNOSIS — I161 Hypertensive emergency: Secondary | ICD-10-CM | POA: Diagnosis not present

## 2019-06-10 DIAGNOSIS — Z79890 Hormone replacement therapy (postmenopausal): Secondary | ICD-10-CM | POA: Diagnosis not present

## 2019-06-10 DIAGNOSIS — E1129 Type 2 diabetes mellitus with other diabetic kidney complication: Secondary | ICD-10-CM

## 2019-06-10 DIAGNOSIS — G20 Parkinson's disease: Secondary | ICD-10-CM | POA: Diagnosis present

## 2019-06-10 DIAGNOSIS — Z03818 Encounter for observation for suspected exposure to other biological agents ruled out: Secondary | ICD-10-CM | POA: Diagnosis not present

## 2019-06-10 DIAGNOSIS — I69398 Other sequelae of cerebral infarction: Secondary | ICD-10-CM

## 2019-06-10 DIAGNOSIS — K9181 Other intraoperative complications of digestive system: Secondary | ICD-10-CM | POA: Diagnosis not present

## 2019-06-10 DIAGNOSIS — K812 Acute cholecystitis with chronic cholecystitis: Secondary | ICD-10-CM | POA: Diagnosis not present

## 2019-06-10 DIAGNOSIS — E119 Type 2 diabetes mellitus without complications: Secondary | ICD-10-CM | POA: Diagnosis not present

## 2019-06-10 DIAGNOSIS — K811 Chronic cholecystitis: Secondary | ICD-10-CM | POA: Diagnosis not present

## 2019-06-10 DIAGNOSIS — I129 Hypertensive chronic kidney disease with stage 1 through stage 4 chronic kidney disease, or unspecified chronic kidney disease: Secondary | ICD-10-CM | POA: Diagnosis present

## 2019-06-10 DIAGNOSIS — K810 Acute cholecystitis: Secondary | ICD-10-CM | POA: Diagnosis not present

## 2019-06-10 HISTORY — DX: Hypertensive urgency: I16.0

## 2019-06-10 HISTORY — DX: Calculus of bile duct without cholangitis or cholecystitis without obstruction: K80.50

## 2019-06-10 HISTORY — DX: Cerebral infarction, unspecified: I63.9

## 2019-06-10 HISTORY — DX: Chronic cholecystitis: K81.1

## 2019-06-10 LAB — URINALYSIS, COMPLETE (UACMP) WITH MICROSCOPIC
Bacteria, UA: NONE SEEN
Bilirubin Urine: NEGATIVE
Glucose, UA: NEGATIVE mg/dL
Hgb urine dipstick: NEGATIVE
Ketones, ur: 5 mg/dL — AB
Leukocytes,Ua: NEGATIVE
Nitrite: NEGATIVE
Protein, ur: 100 mg/dL — AB
Specific Gravity, Urine: 1.016 (ref 1.005–1.030)
pH: 6 (ref 5.0–8.0)

## 2019-06-10 LAB — HEMOGLOBIN A1C
Hgb A1c MFr Bld: 6.9 % — ABNORMAL HIGH (ref 4.8–5.6)
Mean Plasma Glucose: 151.33 mg/dL

## 2019-06-10 LAB — COMPREHENSIVE METABOLIC PANEL
ALT: 30 U/L (ref 0–44)
AST: 340 U/L — ABNORMAL HIGH (ref 15–41)
Albumin: 3.8 g/dL (ref 3.5–5.0)
Alkaline Phosphatase: 476 U/L — ABNORMAL HIGH (ref 38–126)
Anion gap: 10 (ref 5–15)
BUN: 17 mg/dL (ref 8–23)
CO2: 24 mmol/L (ref 22–32)
Calcium: 8.5 mg/dL — ABNORMAL LOW (ref 8.9–10.3)
Chloride: 100 mmol/L (ref 98–111)
Creatinine, Ser: 1.2 mg/dL (ref 0.61–1.24)
GFR calc Af Amer: >60 mL/min (ref >60)
GFR calc non Af Amer: 57 mL/min — ABNORMAL LOW (ref >60)
Glucose, Bld: 69 mg/dL — ABNORMAL LOW (ref 70–99)
Potassium: 3.1 mmol/L — ABNORMAL LOW (ref 3.5–5.1)
Sodium: 134 mmol/L — ABNORMAL LOW (ref 135–145)
Total Bilirubin: 5.4 mg/dL — ABNORMAL HIGH (ref 0.3–1.2)
Total Protein: 8.1 g/dL (ref 6.5–8.1)

## 2019-06-10 LAB — CBC
HCT: 43.8 % (ref 39.0–52.0)
Hemoglobin: 13.8 g/dL (ref 13.0–17.0)
MCH: 27.8 pg (ref 26.0–34.0)
MCHC: 31.5 g/dL (ref 30.0–36.0)
MCV: 88.1 fL (ref 80.0–100.0)
Platelets: 161 10*3/uL (ref 150–400)
RBC: 4.97 MIL/uL (ref 4.22–5.81)
RDW: 15.5 % (ref 11.5–15.5)
WBC: 7.9 10*3/uL (ref 4.0–10.5)
nRBC: 0 % (ref 0.0–0.2)

## 2019-06-10 LAB — GLUCOSE, CAPILLARY
Glucose-Capillary: 53 mg/dL — ABNORMAL LOW (ref 70–99)
Glucose-Capillary: 177 mg/dL — ABNORMAL HIGH (ref 70–99)

## 2019-06-10 LAB — LIPASE, BLOOD: Lipase: 46 U/L (ref 11–51)

## 2019-06-10 LAB — MAGNESIUM: Magnesium: 2 mg/dL (ref 1.7–2.4)

## 2019-06-10 MED ORDER — POTASSIUM CHLORIDE 10 MEQ/100ML IV SOLN
10.0000 meq | INTRAVENOUS | Status: AC
  Administered 2019-06-10 (×2): 10 meq via INTRAVENOUS
  Filled 2019-06-10 (×2): qty 100

## 2019-06-10 MED ORDER — GADOBUTROL 1 MMOL/ML IV SOLN
7.5000 mL | Freq: Once | INTRAVENOUS | Status: AC | PRN
  Administered 2019-06-10: 22:00:00 7.5 mL via INTRAVENOUS

## 2019-06-10 MED ORDER — MORPHINE SULFATE (PF) 2 MG/ML IV SOLN
2.0000 mg | Freq: Once | INTRAVENOUS | Status: AC
  Administered 2019-06-10: 18:00:00 2 mg via INTRAVENOUS
  Filled 2019-06-10: qty 1

## 2019-06-10 MED ORDER — INSULIN ASPART 100 UNIT/ML ~~LOC~~ SOLN
0.0000 [IU] | SUBCUTANEOUS | Status: DC
  Administered 2019-06-11 (×3): 3 [IU] via SUBCUTANEOUS
  Administered 2019-06-12: 8 [IU] via SUBCUTANEOUS
  Administered 2019-06-12: 01:00:00 2 [IU] via SUBCUTANEOUS
  Administered 2019-06-12: 20:00:00 8 [IU] via SUBCUTANEOUS
  Administered 2019-06-12: 18:00:00 5 [IU] via SUBCUTANEOUS
  Administered 2019-06-12: 09:00:00 3 [IU] via SUBCUTANEOUS
  Administered 2019-06-12: 05:00:00 2 [IU] via SUBCUTANEOUS
  Administered 2019-06-13: 12:00:00 3 [IU] via SUBCUTANEOUS
  Administered 2019-06-13 (×2): 5 [IU] via SUBCUTANEOUS
  Administered 2019-06-13: 09:00:00 3 [IU] via SUBCUTANEOUS
  Administered 2019-06-14 (×2): 5 [IU] via SUBCUTANEOUS
  Administered 2019-06-14: 21:00:00 4 [IU] via SUBCUTANEOUS
  Administered 2019-06-14 (×2): 5 [IU] via SUBCUTANEOUS
  Administered 2019-06-14 – 2019-06-15 (×2): 3 [IU] via SUBCUTANEOUS
  Filled 2019-06-10 (×19): qty 1

## 2019-06-10 MED ORDER — HYDRALAZINE HCL 20 MG/ML IJ SOLN
10.0000 mg | Freq: Four times a day (QID) | INTRAMUSCULAR | Status: DC | PRN
  Administered 2019-06-11 – 2019-06-14 (×4): 10 mg via INTRAVENOUS
  Filled 2019-06-10 (×3): qty 1

## 2019-06-10 MED ORDER — MORPHINE SULFATE (PF) 2 MG/ML IV SOLN
2.0000 mg | INTRAVENOUS | Status: DC | PRN
  Administered 2019-06-13 – 2019-06-14 (×2): 2 mg via INTRAVENOUS
  Filled 2019-06-10 (×2): qty 1

## 2019-06-10 MED ORDER — DEXTROSE 50 % IV SOLN
25.0000 g | Freq: Once | INTRAVENOUS | Status: AC
  Administered 2019-06-10: 21:00:00 25 g via INTRAVENOUS

## 2019-06-10 MED ORDER — ACETAMINOPHEN 650 MG RE SUPP: 650.0000 mg | Freq: Four times a day (QID) | RECTAL | Status: DC | PRN

## 2019-06-10 MED ORDER — ENOXAPARIN SODIUM 40 MG/0.4ML ~~LOC~~ SOLN: 40.0000 mg | SUBCUTANEOUS | Status: DC

## 2019-06-10 MED ORDER — LABETALOL HCL 5 MG/ML IV SOLN
10.0000 mg | INTRAVENOUS | Status: DC | PRN
  Administered 2019-06-11 – 2019-06-12 (×3): 10 mg via INTRAVENOUS
  Filled 2019-06-10 (×2): qty 4

## 2019-06-10 MED ORDER — DEXTROSE-NACL 5-0.45 % IV SOLN: INTRAVENOUS | Status: DC

## 2019-06-10 MED ORDER — ONDANSETRON HCL 4 MG/2ML IJ SOLN
4.0000 mg | Freq: Four times a day (QID) | INTRAMUSCULAR | Status: DC | PRN
  Administered 2019-06-13: 20:00:00 4 mg via INTRAVENOUS

## 2019-06-10 MED ORDER — ONDANSETRON HCL 4 MG/2ML IJ SOLN
4.0000 mg | Freq: Once | INTRAMUSCULAR | Status: AC
  Administered 2019-06-10: 18:00:00 4 mg via INTRAVENOUS
  Filled 2019-06-10: qty 2

## 2019-06-10 MED ORDER — DEXTROSE 50 % IV SOLN
INTRAVENOUS | Status: AC
  Filled 2019-06-10: qty 50

## 2019-06-10 MED ORDER — ONDANSETRON HCL 4 MG PO TABS: 4.0000 mg | ORAL_TABLET | Freq: Four times a day (QID) | ORAL | Status: DC | PRN

## 2019-06-10 MED ORDER — KCL IN DEXTROSE-NACL 20-5-0.45 MEQ/L-%-% IV SOLN
INTRAVENOUS | Status: DC
  Filled 2019-06-10 (×11): qty 1000

## 2019-06-10 MED ORDER — ACETAMINOPHEN 325 MG PO TABS: 650.0000 mg | ORAL_TABLET | Freq: Four times a day (QID) | ORAL | Status: DC | PRN

## 2019-06-10 NOTE — ED Notes (Signed)
Transporting pt to room #217 via stretcher.

## 2019-06-10 NOTE — ED Provider Notes (Signed)
[facility] Emergency Department Provider Note   ____________________________________________    I have reviewed the triage vital signs and the nursing notes.   HISTORY  Chief Complaint Abdominal Pain     HPI LINZY DARLING Sr. is a 79 y.o. male with a history of diabetes, hypertension who presents with upper abdominal pain x2 days which is been constant and moderate to severe.  It does not radiate.  Has not take anything for this.  He has never had this before.  No history of abdominal surgery.  Review of medical records demonstrates the patient was seen by Dr. Lysle Pearl of general surgery after having abnormal CT scan which showed possible chronic cholecystitis, HIDA scan performed as well.  Patient was asymptomatic at the time and opted for observation  Past Medical History:  Diagnosis Date  . Diabetes mellitus without complication ([place])   . Hypertension   . Parkinson disease ([place])   . Stroke [facility])     Patient Active Problem List   Diagnosis Date Noted  . Dehydration 12/10/2018  . Seizures ([place])   . Expressive aphasia   . CVA (cerebral vascular accident) ([place]) 05/05/2016    Past Surgical History:  Procedure Laterality Date  . ELBOW SURGERY    . NO PAST SURGERIES      Prior to Admission medications   Medication Sig Start Date End Date Taking? Authorizing Provider  amLODipine (NORVASC) 10 MG tablet Take 1 tablet (10 mg total) by mouth daily. 05/10/16   Gladstone Lighter, MD  aspirin EC 81 MG EC tablet Take 1 tablet (81 mg total) by mouth daily. 05/10/16   Gladstone Lighter, MD  atenolol (TENORMIN) 50 MG tablet Take 50 mg by mouth daily.    [provider]  atorvastatin (LIPITOR) 80 MG tablet Take 40 mg by mouth daily.    [provider]  carbidopa-levodopa (SINEMET IR) 25-100 MG tablet Take 1 tablet by mouth 3 (three) times daily. 12/07/18   [provider]  divalproex (DEPAKOTE ER) 500 MG 24 hr tablet Take 1  tablet by mouth 2 (two) times daily. 10/02/18   [provider]  donepezil (ARICEPT) 10 MG tablet Take 10 mg by mouth daily. 11/23/18   [provider]  hydrALAZINE (APRESOLINE) 50 MG tablet Take 50 mg by mouth 3 (three) times daily.  09/28/18   [provider]  insulin aspart protamine- aspart (NOVOLOG MIX 70/30) (70-30) 100 UNIT/ML injection Inject 30 Units into the skin 2 (two) times daily with a meal.     [provider]  levothyroxine (SYNTHROID, LEVOTHROID) 75 MCG tablet Take 75 mcg by mouth daily before breakfast.    [provider]  lisinopril (PRINIVIL,ZESTRIL) 40 MG tablet Take 1 tablet (40 mg total) by mouth at bedtime. 05/10/16   Gladstone Lighter, MD  vitamin C (ASCORBIC ACID) 500 MG tablet Take 1,000 mg by mouth daily.    [provider]     Allergies Patient has no known allergies.  Family History  Problem Relation Age of Onset  . Brain cancer Mother   . Prostate cancer Neg Hx   . Bladder Cancer Neg Hx   . Kidney cancer Neg Hx     Social History Social History   Tobacco Use  . Smoking status: Never Smoker  . Smokeless tobacco: Never Used  Substance Use Topics  . Alcohol use: No  . Drug use: No    Review of Systems  Constitutional: No fever/chills Eyes: No visual  changes.  ENT: No sore throat. Cardiovascular: Denies chest pain. Respiratory: Denies shortness of breath. Gastrointestinal: As above Genitourinary: Negative for dysuria. Musculoskeletal: Negative for back pain. Skin: Negative for rash. Neurological: Negative for headaches    ____________________________________________   PHYSICAL EXAM:  VITAL SIGNS: ED Triage Vitals  Enc Vitals Group     BP 06/10/19 1515 (!) 201/83     Pulse Rate 06/10/19 1514 (!) 59     Resp 06/10/19 1514 18     Temp 06/10/19 1514 97.8 F (36.6 C)     Temp Source 06/10/19 1514 Oral     SpO2 06/10/19 1515 94 %     Weight 06/10/19 1513 86.2 kg (190 lb)     Height  06/10/19 1513 1.778 m (5' 10" )     Head Circumference --      Peak Flow --      Pain Score 06/10/19 1513 4     Pain Loc --      Pain Edu? --      Excl. in [place]? --     Constitutional: Alert and oriented.   Nose: No congestion/rhinnorhea. Mouth/Throat: Mucous membranes are moist.    Cardiovascular: Normal rate, regular rhythm.   Good peripheral circulation. Respiratory: Normal respiratory effort.  No retractions. Lungs CTAB. Gastrointestinal: Mild tenderness palpation right upper quadrant, no distention.  No CVA tenderness.  Musculoskeletal:  Warm and well perfused Neurologic:  Normal speech and language. No gross focal neurologic deficits are appreciated.  Skin:  Skin is warm, dry and intact. No rash noted. Psychiatric: Mood and affect are normal. Speech and behavior are normal.  ____________________________________________   LABS (all labs ordered are listed, but only abnormal results are displayed)  Labs Reviewed  COMPREHENSIVE METABOLIC PANEL - Abnormal; Notable for the following components:      Result Value   Sodium 134 (*)    Potassium 3.1 (*)    Glucose, Bld 69 (*)    Calcium 8.5 (*)    AST 340 (*)    Alkaline Phosphatase 476 (*)    Total Bilirubin 5.4 (*)    GFR calc non Af Amer 57 (*)    All other components within normal limits  URINALYSIS, COMPLETE (UACMP) WITH MICROSCOPIC - Abnormal; Notable for the following components:   Color, Urine AMBER (*)    APPearance CLEAR (*)    Ketones, ur 5 (*)    Protein, ur 100 (*)    All other components within normal limits  LIPASE, BLOOD  CBC   ____________________________________________  EKG  None ____________________________________________  RADIOLOGY  Ultrasound demonstrates no acute cholecystitis, possible mass versus sludge ____________________________________________   PROCEDURES  Procedure(s) performed: No  Procedures   Critical Care performed:  No ____________________________________________   INITIAL IMPRESSION / ASSESSMENT AND PLAN / ED COURSE  Pertinent labs & imaging results that were available during my care of the patient were reviewed by me and considered in my medical decision making (see chart for details).  Patient presents with upper abdominal pain, mild tenderness in the right upper quadrant, review records demonstrates a history of likely chronic cholecystitis has been asymptomatic in the past.  However now with elevated T bili, elevated alk phos, mild jaundice with tenderness we will repeat ultrasound.  Will treat with IV morphine, IV Zofran, reevaluate.  Ultrasound demonstrates possible mass versus sludge in the gallbladder  Discussed with Dr. Hampton Abbot of general surgery who recommends medicine admission, MRCP to evaluate further, if need for cholecystectomy, surgery will be happy to  oblige but no indication for surgery at this time  Discussed results with patient, he is feeling much better after morphine    ____________________________________________   FINAL CLINICAL IMPRESSION(S) / ED DIAGNOSES  Final diagnoses:  Upper abdominal pain  Jaundice        Note:  This document was prepared using Dragon voice recognition software and may include unintentional dictation errors.   Lavonia Drafts, MD 06/10/19 1843

## 2019-06-10 NOTE — ED Triage Notes (Addendum)
Pt here for pain across lower abdomen at belt line.  No NVD or fevers.  Eyes appear mildly jaundice. Pt reports some mild constipations. Denies urinary sx but reports urine has been dark in color.  Ambulatory.  NAD.

## 2019-06-10 NOTE — H&P (Signed)
History and Physical    Gilbert VANDERHOOF Sr. YTK:[id number] DOB: May 14, 1940 DOA: 06/10/2019  PCP: [name], MD   Patient coming from: home  I have personally briefly reviewed patient's old medical records in [facility]  Chief Complaint: abdominal pain x3 days  HPI: Gilbert [name] Sr. is a 79 y.o. male with medical history significant for diabetes, hypertension, history of CVA and seizure disorder, who presented to the emergency room with 3-day history of periumbilical  mid abdominal pain  crampy and of moderate to severe intensity nonradiating.worse with ingestion of food stuff.  Did not try anything to alleviate pain.  Has no appetite, saying he has not eaten anything all day.  He said his blood sugar dropped and his wife gave him some sugar water to drink.  He denies nausea or vomiting .  Also noticed that his urine is dark.  Gilbert [name] he was constipated today..  Denies fever or chills.  Denies cough or chest pain.  Patient was evaluated by surgery in December 2020 for LLQ pain with abnormal gallbladder findings on CT abdomen.Cholecystectomy was recommended as an option but patient elected observation. ED Course: On arrival in the emergency room, he was afebrile with temperature of 97.8.  Blood pressure was very elevated at 201/83, HR 59, RR 18, O2 sat 94% on room air.  His white cell count was normal at 7900, hemoglobin 13.8.  Blood work notable for elevated liver enzymes with an AST of 340, ALT of 30, alk phos 476 and total bilirubin 5.4.  Right upper quadrant ultrasound showed an irregular masslike structure within the gallbladder, neoplastic versus tumefactive sludge, 7 mm gallstone identified, CBD 6 mm.  Cholelithiasis without evidence of acute cholecystitis, normal liver.  Pain was completely relieved with morphine administered in the emergency room.  The emergency room provider spoke with surgeon, Dr. Hampton Abbot who recommended admission for MRCP and consultation with primary surgeon,  Dr. Lysle Pearl  Review of Systems: As per HPI otherwise 10 point review of systems negative.    Past Medical History:  Diagnosis Date  . Diabetes mellitus without complication ([place])   . Hypertension   . Parkinson disease ([place])   . Stroke [facility])     Past Surgical History:  Procedure Laterality Date  . ELBOW SURGERY    . NO PAST SURGERIES       reports that he has never smoked. He has never used smokeless tobacco. He reports that he does not drink alcohol or use drugs.  No Known Allergies  Family History  Problem Relation Age of Onset  . Brain cancer Mother   . Prostate cancer Neg Hx   . Bladder Cancer Neg Hx   . Kidney cancer Neg Hx      Prior to Admission medications   Medication Sig Start Date End Date Taking? Authorizing Provider  amLODipine (NORVASC) 10 MG tablet Take 1 tablet (10 mg total) by mouth daily. 05/10/16   Gladstone Lighter, MD  aspirin EC 81 MG EC tablet Take 1 tablet (81 mg total) by mouth daily. 05/10/16   Gladstone Lighter, MD  atenolol (TENORMIN) 50 MG tablet Take 50 mg by mouth daily.    [provider]  atorvastatin (LIPITOR) 80 MG tablet Take 40 mg by mouth daily.    [provider]  carbidopa-levodopa (SINEMET IR) 25-100 MG tablet Take 1 tablet by mouth 3 (three) times daily. 12/07/18   [provider]  divalproex (DEPAKOTE ER) 500 MG 24 hr tablet Take 1  tablet by mouth 2 (two) times daily. 10/02/18   [provider]  donepezil (ARICEPT) 10 MG tablet Take 10 mg by mouth daily. 11/23/18   [provider]  hydrALAZINE (APRESOLINE) 50 MG tablet Take 50 mg by mouth 3 (three) times daily.  09/28/18   [provider]  insulin aspart protamine- aspart (NOVOLOG MIX 70/30) (70-30) 100 UNIT/ML injection Inject 30 Units into the skin 2 (two) times daily with a meal.     [provider]  levothyroxine (SYNTHROID, LEVOTHROID) 75 MCG tablet Take 75 mcg by mouth daily before breakfast.    [provider]    lisinopril (PRINIVIL,ZESTRIL) 40 MG tablet Take 1 tablet (40 mg total) by mouth at bedtime. 05/10/16   Gladstone Lighter, MD  vitamin C (ASCORBIC ACID) 500 MG tablet Take 1,000 mg by mouth daily.    [provider]    Physical Exam: Vitals:   06/10/19 1514 06/10/19 1515 06/10/19 1737 06/10/19 1830  BP:  (!) 201/83 (!) 206/111 (!) 190/97  Pulse: (!) 59  86 76  Resp: 18  (!) 25 20  Temp: 97.8 F (36.6 C)     TempSrc: Oral     SpO2:  94% 91% 97%  Weight:      Height:         Vitals:   06/10/19 1514 06/10/19 1515 06/10/19 1737 06/10/19 1830  BP:  (!) 201/83 (!) 206/111 (!) 190/97  Pulse: (!) 59  86 76  Resp: 18  (!) 25 20  Temp: 97.8 F (36.6 C)     TempSrc: Oral     SpO2:  94% 91% 97%  Weight:      Height:        Constitutional: NAD, alert and oriented x 3 Eyes: PERRL, lids and mildly icteric sclerae ENMT: Mucous membranes are moist.  Neck: normal, supple, no masses, no thyromegaly Respiratory: clear to auscultation bilaterally, no wheezing, no crackles. Normal respiratory effort. No accessory muscle use.  Cardiovascular: Regular rate and rhythm, no murmurs / rubs / gallops. No extremity edema. 2+ pedal pulses. No carotid bruits.  Abdomen: non tender on palpation, no masses palpated. No hepatosplenomegaly. Bowel sounds positive.  Musculoskeletal: no clubbing / cyanosis. No joint deformity upper and lower extremities.  Skin: no rashes, lesions, ulcers.  Neurologic: No gross focal neurologic deficit. Psychiatric: Normal mood and affect.   Labs on Admission: I have personally reviewed following labs and imaging studies  CBC: Recent Labs  Lab 06/10/19 1517  WBC 7.9  HGB 13.8  HCT 43.8  MCV 88.1  PLT 163   Basic Metabolic Panel: Recent Labs  Lab 06/10/19 1517  NA 134*  K 3.1*  CL 100  CO2 24  GLUCOSE 69*  BUN 17  CREATININE 1.20  CALCIUM 8.5*   GFR: Estimated Creatinine Clearance: 50.7 mL/min (by C-G formula based on SCr of 1.2 mg/dL). Liver  Function Tests: Recent Labs  Lab 06/10/19 1517  AST 340*  ALT 30  ALKPHOS 476*  BILITOT 5.4*  PROT 8.1  ALBUMIN 3.8   Recent Labs  Lab 06/10/19 1517  LIPASE 46   No results for input(s): AMMONIA in the last 168 hours. Coagulation Profile: No results for input(s): INR, PROTIME in the last 168 hours. Cardiac Enzymes: No results for input(s): CKTOTAL, CKMB, CKMBINDEX, TROPONINI in the last 168 hours. BNP (last 3 results) No results for input(s): PROBNP in the last 8760 hours. HbA1C: No results for input(s): HGBA1C in the last 72 hours. CBG: No  results for input(s): GLUCAP in the last 168 hours. Lipid Profile: No results for input(s): CHOL, HDL, LDLCALC, TRIG, CHOLHDL, LDLDIRECT in the last 72 hours. Thyroid Function Tests: No results for input(s): TSH, T4TOTAL, FREET4, T3FREE, THYROIDAB in the last 72 hours. Anemia Panel: No results for input(s): VITAMINB12, FOLATE, FERRITIN, TIBC, IRON, RETICCTPCT in the last 72 hours. Urine analysis:    Component Value Date/Time   COLORURINE AMBER (A) 06/10/2019 1517   APPEARANCEUR CLEAR (A) 06/10/2019 1517   APPEARANCEUR Clear 11/11/2016 1406   LABSPEC 1.016 06/10/2019 1517   PHURINE 6.0 06/10/2019 1517   GLUCOSEU NEGATIVE 06/10/2019 1517   HGBUR NEGATIVE 06/10/2019 1517   BILIRUBINUR NEGATIVE 06/10/2019 1517   BILIRUBINUR Negative 11/11/2016 1406   KETONESUR 5 (A) 06/10/2019 1517   PROTEINUR 100 (A) 06/10/2019 1517   NITRITE NEGATIVE 06/10/2019 1517   LEUKOCYTESUR NEGATIVE 06/10/2019 1517    Radiological Exams on Admission: US Abdomen Limited RUQ  Result Date: 06/10/2019 CLINICAL DATA:  Upper abdominal pain for 3 days. EXAM: ULTRASOUND ABDOMEN LIMITED RIGHT UPPER QUADRANT COMPARISON:  Gilbert [name]. FINDINGS: Gallbladder: Irregular masslike structure within the gallbladder, neoplastic versus tumefactive sludge. 7 mm gallstone identified. No pericholecystic fluid. No sonographic Murphy's sign elicited, per the sonographer. Common bile  duct: Diameter: 6 mm Liver: No focal lesion identified. Within normal limits in parenchymal echogenicity. Portal vein is patent on color Doppler imaging with normal direction of blood flow towards the liver. Other: Gilbert [name]. IMPRESSION: 1. Irregular masslike structure within the gallbladder, neoplastic process versus tumefactive sludge. 2. Cholelithiasis without evidence of acute cholecystitis. 3. Liver appears normal. Electronically Signed   By: Franki Cabot M.D.   On: 06/10/2019 18:19    EKG: Independently reviewed.   Assessment/Plan Principal Problem:   Abdominal pain   Abnormal findings on diagnostic imaging of gall bladder   Obstructive jaundice Cholelithiasis without cholecystitis -AST 340, ALT 30, alk phos 476 with total bilirubin 5.4.  Lipase was 46 -Ultrasound showing cholelithiasis without cholecystitis and irregular masslike structure within the gallbladder neoplastic versus tumefactive follow-up with 7 mm stone, CBD 6 mm -Surgical consult requested -N.p.o. except for sips and meds -MRCP with and without contrast as recommended by surgery -IV hydration -Pain control, IV antiemetics  Active problems Hypoglycemia Type II diabetes with hyperlipidemia -Hydration with D5 1/2NS -Blood sugar was 69.  Patient reports not eating anything all day due to abdominal pain -Hypoglycemia protocol, sliding scale coverage  Hypertensive urgency -Blood pressure was as high as 206/111 in the emergency room -Elevated blood pressure possibly related to pain -Continue home lisinopril, amlodipine and atenolol, as needed IV hypertensives blood pressure remains elevated   History of cerebrovascular accident without residual deficits -Continue aspirin and atorvastatin  Parkinson's disease -Continue Sinemet once reconciled     Seizure disorder ([place]) -Continue Depakote   Type 2 diabetes mellitus with hyperlipidemia ([place]) -Hold home diabetic regimen -Insulin sliding scale coverage         DVT prophylaxis: lovenox Code Status: full code  Family Communication: Gilbert [name]  Disposition Plan: Back to previous home environment Consults called: Dr. Cyndie Chime will speak with Dr. Lysle Pearl, patient;'s primary surgeon(per secure text conversation)   Athena Masse MD [facility]     06/10/2019, 7:08 PM

## 2019-06-10 NOTE — ED Notes (Signed)
Ultrasound at patient bedside. 

## 2019-06-11 DIAGNOSIS — Z8673 Personal history of transient ischemic attack (TIA), and cerebral infarction without residual deficits: Secondary | ICD-10-CM

## 2019-06-11 DIAGNOSIS — K8050 Calculus of bile duct without cholangitis or cholecystitis without obstruction: Secondary | ICD-10-CM

## 2019-06-11 DIAGNOSIS — R17 Unspecified jaundice: Secondary | ICD-10-CM

## 2019-06-11 DIAGNOSIS — Z7189 Other specified counseling: Secondary | ICD-10-CM

## 2019-06-11 DIAGNOSIS — I160 Hypertensive urgency: Secondary | ICD-10-CM

## 2019-06-11 DIAGNOSIS — R932 Abnormal findings on diagnostic imaging of liver and biliary tract: Secondary | ICD-10-CM

## 2019-06-11 DIAGNOSIS — I69320 Aphasia following cerebral infarction: Secondary | ICD-10-CM

## 2019-06-11 DIAGNOSIS — Z515 Encounter for palliative care: Secondary | ICD-10-CM

## 2019-06-11 LAB — COMPREHENSIVE METABOLIC PANEL
ALT: 110 U/L — ABNORMAL HIGH (ref 0–44)
AST: 251 U/L — ABNORMAL HIGH (ref 15–41)
Albumin: 3 g/dL — ABNORMAL LOW (ref 3.5–5.0)
Alkaline Phosphatase: 418 U/L — ABNORMAL HIGH (ref 38–126)
Anion gap: 6 (ref 5–15)
BUN: 15 mg/dL (ref 8–23)
CO2: 28 mmol/L (ref 22–32)
Calcium: 8 mg/dL — ABNORMAL LOW (ref 8.9–10.3)
Chloride: 100 mmol/L (ref 98–111)
Creatinine, Ser: 1.26 mg/dL — ABNORMAL HIGH (ref 0.61–1.24)
GFR calc Af Amer: >60 mL/min (ref >60)
GFR calc non Af Amer: 54 mL/min — ABNORMAL LOW (ref >60)
Glucose, Bld: 166 mg/dL — ABNORMAL HIGH (ref 70–99)
Potassium: 4 mmol/L (ref 3.5–5.1)
Sodium: 134 mmol/L — ABNORMAL LOW (ref 135–145)
Total Bilirubin: 4.5 mg/dL — ABNORMAL HIGH (ref 0.3–1.2)
Total Protein: 6.6 g/dL (ref 6.5–8.1)

## 2019-06-11 LAB — CBC
HCT: 37.9 % — ABNORMAL LOW (ref 39.0–52.0)
Hemoglobin: 12 g/dL — ABNORMAL LOW (ref 13.0–17.0)
MCH: 27.8 pg (ref 26.0–34.0)
MCHC: 31.7 g/dL (ref 30.0–36.0)
MCV: 87.7 fL (ref 80.0–100.0)
Platelets: 128 10*3/uL — ABNORMAL LOW (ref 150–400)
RBC: 4.32 MIL/uL (ref 4.22–5.81)
RDW: 15.2 % (ref 11.5–15.5)
WBC: 4.7 10*3/uL (ref 4.0–10.5)
nRBC: 0 % (ref 0.0–0.2)

## 2019-06-11 LAB — GLUCOSE, CAPILLARY
Glucose-Capillary: 107 mg/dL — ABNORMAL HIGH (ref 70–99)
Glucose-Capillary: 137 mg/dL — ABNORMAL HIGH (ref 70–99)
Glucose-Capillary: 140 mg/dL — ABNORMAL HIGH (ref 70–99)
Glucose-Capillary: 151 mg/dL — ABNORMAL HIGH (ref 70–99)
Glucose-Capillary: 151 mg/dL — ABNORMAL HIGH (ref 70–99)
Glucose-Capillary: 156 mg/dL — ABNORMAL HIGH (ref 70–99)

## 2019-06-11 LAB — PROTIME-INR
INR: 1 (ref 0.8–1.2)
Prothrombin Time: 13.3 seconds (ref 11.4–15.2)

## 2019-06-11 LAB — LIPASE, BLOOD: Lipase: 39 U/L (ref 11–51)

## 2019-06-11 LAB — SARS CORONAVIRUS 2 (TAT 6-24 HRS): SARS Coronavirus 2: NEGATIVE

## 2019-06-11 MED ORDER — DIVALPROEX SODIUM ER 500 MG PO TB24
500.0000 mg | ORAL_TABLET | Freq: Two times a day (BID) | ORAL | Status: DC
  Administered 2019-06-11 – 2019-06-15 (×7): 500 mg via ORAL
  Filled 2019-06-11 (×9): qty 1

## 2019-06-11 MED ORDER — CARBIDOPA-LEVODOPA 25-100 MG PO TABS
1.0000 | ORAL_TABLET | Freq: Three times a day (TID) | ORAL | Status: DC
  Administered 2019-06-11 – 2019-06-15 (×10): 1 via ORAL
  Filled 2019-06-11 (×14): qty 1

## 2019-06-11 NOTE — Progress Notes (Addendum)
PROGRESS NOTE    BLAYTON HUTTNER Sr.  WER:[id number] DOB: 1940/06/24 DOA: 06/10/2019  PCP: [name], MD    LOS - 1   Brief Narrative:  Enedina Finner Sr. is a 79 y.o. male with medical history significant for diabetes, hypertension, history of CVA and seizure disorder, who presented to the ED on 2/14 with 3-day history of periumbilical  mid abdominal pain  crampy and of moderate to severe intensity non-radiating worse after eating.  Patient had recently been evaluated by surgery in December 2020 for left lower quadrant pain and abnormal gallbladder findings on CT abdomen.  Cholecystectomy was recommended at that time as an option but patient elected for observation in the ED, patient was afebrile, hypertensive 201/83 and otherwise stable vitals.  Labs notable for elevated liver enzymes.  Right upper quadrant ultrasound showed an irregular masslike structure within the gallbladder, neoplastic versus tumefactive sludge, 7 mm gallstone was seen and common bile duct dilated to 6 mm.  ED physician spoke with general surgery on-call recommended admission for MRCP.  Patient better to hospitalist service with consult to general surgery and gastroenterology for further evaluation management.  Subjective 2/15: Patient seen examined this morning at bedside.  No acute events reported overnight.  He reports no longer having abdominal pain this morning.  No nausea vomiting.  No chest pain or shortness of breath.  We discussed the findings of his right upper quadrant ultrasound and need for further for work-up.  He expressed understanding.  Assessment & Plan:   Principal Problem:   Abdominal pain Active Problems:   Seizure disorder ([facility])   Type 2 diabetes mellitus with hyperlipidemia ([facility])   Abnormal findings on diagnostic imaging of gall bladder   Obstructive jaundice   History of CVA (cerebrovascular accident) without residual deficits   Hypertensive urgency   Hypoglycemia associated with  type 2 diabetes mellitus ([place])   Cholelithiasis without cholecystitis Choledocholithiasis Liver enzymes on admission: AST 340, ALT 30, alk phos 476 with total bilirubin 5.4.  Lipase was 46. 2/14: RUQ ultrasound showing cholelithiasis without cholecystitis and irregular masslike structure within the gallbladder neoplastic versus tumefactive follow-up with 7 mm stone, CBD 6 mm. 2/15: MRCP confirmed stone in common bile duct. --ERCP planned for tomorrow morning --To be determined if need for cholecystectomy following ERCP --Hold Lovenox VTE prophylaxis -GI and general surgery following -N.p.o. except for sips and meds -IV hydration -Pain control, IV antiemetics   Hypoglycemia Type II diabetes with hyperlipidemia -Hold home diabetes regimen -Hydration with D5 1/2NS -Blood sugar was 69.  Patient reports not eating anything all day due to abdominal pain -Hypoglycemia protocol, sliding scale coverage  Hypertensive urgency -improved -Blood pressure was as high as 206/111 in the emergency room -Elevated blood pressure possibly related to pain -Continue home lisinopril, amlodipine and atenolol, as needed IV hypertensives blood pressure remains elevated   History of cerebrovascular accident without residual deficits -Continue aspirin and atorvastatin  Parkinson's disease -Continue Sinemet once reconciled     Seizure disorder -Continue Depakote    DVT prophylaxis: Lovenox   Code Status: Full Code  Family Communication: None at bedside during encounter  Disposition Plan: Expect discharge home to prior home environment, pending PT evaluation and clearance by consultants. Coming From home Exp DC Date: To be determined Barriers: Evaluation as above Medically Stable for Discharge?  No  Consultants:  Gastroenterology  General surgery  Procedures:   None  Antimicrobials:   None   Objective: Vitals:   06/10/19 1830 06/10/19  2007 06/10/19 2126 06/11/19 0429  BP: (!)  190/97 (!) 199/88 (!) 169/67 (!) 165/77  Pulse: 76 64 63 (!) 58  Resp: 20 20 18 18   Temp:   97.7 F (36.5 C) (!) 97.4 F (36.3 C)  TempSrc:   Oral Oral  SpO2: 97% 93% 94% 94%  Weight:      Height:        Intake/Output Summary (Last 24 hours) at 06/11/2019 1547 Last data filed at 06/11/2019 1219 Gross per 24 hour  Intake 1190.16 ml  Output 1110 ml  Net 80.16 ml   Filed Weights   06/10/19 1513  Weight: 86.2 kg    Examination:  General exam: awake, alert, no acute distress HEENT: atraumatic, clear conjunctiva, icteric sclera, moist mucus membranes, hearing grossly normal  Respiratory system: CTAB, no wheezes, rales or rhonchi, normal respiratory effort. Cardiovascular system: normal S1/S2, RRR, no JVD, murmurs, rubs, gallops, no pedal edema.   Gastrointestinal system: soft, NT, ND, no HSM felt, +bowel sounds. Central nervous system: A&O x4. no gross focal neurologic deficits, normal speech Extremities: moves all, no cyanosis, normal tone Skin: dry, intact, normal temperature, jaundice Psychiatry: normal mood, congruent affect, judgement and insight appear normal    Data Reviewed: I have personally reviewed following labs and imaging studies  CBC: Recent Labs  Lab 06/10/19 1517 06/11/19 0540  WBC 7.9 4.7  HGB 13.8 12.0*  HCT 43.8 37.9*  MCV 88.1 87.7  PLT 161 315*   Basic Metabolic Panel: Recent Labs  Lab 06/10/19 1517 06/11/19 0540  NA 134* 134*  K 3.1* 4.0  CL 100 100  CO2 24 28  GLUCOSE 69* 166*  BUN 17 15  CREATININE 1.20 1.26*  CALCIUM 8.5* 8.0*  MG 2.0  --    GFR: Estimated Creatinine Clearance: 48.3 mL/min (A) (by C-G formula based on SCr of 1.26 mg/dL (H)). Liver Function Tests: Recent Labs  Lab 06/10/19 1517 06/11/19 0540  AST 340* 251*  ALT 30 110*  ALKPHOS 476* 418*  BILITOT 5.4* 4.5*  PROT 8.1 6.6  ALBUMIN 3.8 3.0*   Recent Labs  Lab 06/10/19 1517 06/11/19 0540  LIPASE 46 39   No results for input(s): AMMONIA in the last 168  hours. Coagulation Profile: Recent Labs  Lab 06/11/19 0540  INR 1.0   Cardiac Enzymes: No results for input(s): CKTOTAL, CKMB, CKMBINDEX, TROPONINI in the last 168 hours. BNP (last 3 results) No results for input(s): PROBNP in the last 8760 hours. HbA1C: Recent Labs    06/10/19 1517  HGBA1C 6.9*   CBG: Recent Labs  Lab 06/10/19 2139 06/11/19 0029 06/11/19 0426 06/11/19 0750 06/11/19 1139  GLUCAP 177* 107* 137* 151* 140*   Lipid Profile: No results for input(s): CHOL, HDL, LDLCALC, TRIG, CHOLHDL, LDLDIRECT in the last 72 hours. Thyroid Function Tests: No results for input(s): TSH, T4TOTAL, FREET4, T3FREE, THYROIDAB in the last 72 hours. Anemia Panel: No results for input(s): VITAMINB12, FOLATE, FERRITIN, TIBC, IRON, RETICCTPCT in the last 72 hours. Sepsis Labs: No results for input(s): PROCALCITON, LATICACIDVEN in the last 168 hours.  Recent Results (from the past 240 hour(s))  SARS CORONAVIRUS 2 (TAT 6-24 HRS) Nasopharyngeal Nasopharyngeal Swab     Status: None   Collection Time: 06/10/19  8:13 PM   Specimen: Nasopharyngeal Swab  Result Value Ref Range Status   SARS Coronavirus 2 NEGATIVE NEGATIVE Final    Comment: (NOTE) SARS-CoV-2 target nucleic acids are NOT DETECTED. The SARS-CoV-2 RNA is generally detectable in upper and lower respiratory  specimens during the acute phase of infection. Negative results do not preclude SARS-CoV-2 infection, do not rule out co-infections with other pathogens, and should not be used as the sole basis for treatment or other patient management decisions. Negative results must be combined with clinical observations, patient history, and epidemiological information. The expected result is Negative. Fact Sheet for Patients: SugarRoll.be Fact Sheet for Healthcare Providers: https://www.woods-mathews.com/ This test is not yet approved or cleared by the [place] FDA and  has been  authorized for detection and/or diagnosis of SARS-CoV-2 by FDA under an Emergency Use Authorization (EUA). This EUA will remain  in effect (meaning this test can be used) for the duration of the COVID-19 declaration under Section 56 4(b)(1) of the Act, 21 U.S.C. section 360bbb-3(b)(1), unless the authorization is terminated or revoked sooner. Performed at [facility], [place] [address] [place], [place] [address]          Radiology Studies: MR 3D Recon At Scanner  Result Date: 06/11/2019 CLINICAL DATA:  Nonspecific (abnormal) findings on radiological and other examination of musculoskeletal system EXAM: 3-DIMENSIONAL MR IMAGE RENDERING ON ACQUISITION WORKSTATION TECHNIQUE: 3-dimensional MR images were rendered by post-processing of the original MR data on an acquisition workstation. The 3-dimensional MR images were interpreted and findings were reported in the accompanying complete MR report for this study COMPARISON:  None. FINDINGS: See report findings on MRI IMPRESSION: See report impression on accompanying MRI Electronically Signed   By: Suzy Bouchard M.D.   On: 06/11/2019 08:23   MR ABDOMEN MRCP W WO CONTAST  Result Date: 06/11/2019 CLINICAL DATA:  Periumbilical pain. Abdominal pain. Cholelithiasis. EXAM: MRI ABDOMEN WITHOUT AND WITH CONTRAST (INCLUDING MRCP) TECHNIQUE: Multiplanar multisequence MR imaging of the abdomen was performed both before and after the administration of intravenous contrast. Heavily T2-weighted images of the biliary and pancreatic ducts were obtained, and three-dimensional MRCP images were rendered by post processing. CONTRAST:  7.25m GADAVIST GADOBUTROL 1 MMOL/ML IV SOLN COMPARISON:  Ultrasound 06/10/2019, HIDA scan 03/21/2019, CT 03/14/2019 FINDINGS: Lower chest:  Lung bases are clear. Hepatobiliary: Mild intrahepatic biliary duct dilatation. The extrahepatic ducts are mildly dilated. The common hepatic duct measures 8 mm. Common bile duct measures 8 mm.  There are multiple round filling defects within the mid and distal common bile duct (image 18/11). Approximately 5 small filling defects measuring approximately 4-5 mm each consistent choledocholithiasis. These distal stones have high signal intensity on noncontrast T1 weighted imaging (image 46/19) typical of cholesterol stones. Within the neck of the gallbladder, there is similar cluster of material tissue which has high signal intensity on noncontrast T1 weighted imaging (image 39/9). There are gallstones collecting in the neck the gallbladder on comparison CT. Favor mixture of stones and sludge with some chronic inflammation. Chronic narrowing of the neck of the gallbladder associated the stones in probable sludge. Within the fundus of the gallbladder there is wall adherent lesion measuring approximately 10 mm (image 18/10). This lesion also has inherent T1 shortening similar to the stones and sludge in the neck of the gallbladder and common bile duct stones. Pancreas: Normal pancreatic parenchymal intensity. No ductal dilatation or inflammation. Spleen: Normal spleen. Adrenals/urinary tract: Adrenal glands and kidneys are normal. Stomach/Bowel: Stomach and limited of the small bowel is unremarkable Vascular/Lymphatic: Abdominal aortic normal caliber. No retroperitoneal periportal lymphadenopathy. Musculoskeletal: No aggressive osseous lesion IMPRESSION: 1. Mild intra and extrahepatic biliary duct dilatation associated with partially obstructing stones in the common bile duct. 2. Collection of gallstones and sludge in the  neck of the gallbladder with chronic gallbladder neck narrowing. No gallbladder distension. 3. Lesion in the fundus of the gallbladder which corresponds to ultrasound abnormality. This lesion has similar imaging characteristics to the stones and sludge in the neck the gallbladder and favored wall adherent sludge and stones. Cannot completely exclude gallbladder neoplasm. No evidence of  extension of the process beyond the gallbladder wall. Electronically Signed   By: Suzy Bouchard M.D.   On: 06/11/2019 08:16   US Abdomen Limited RUQ  Result Date: 06/10/2019 CLINICAL DATA:  Upper abdominal pain for 3 days. EXAM: ULTRASOUND ABDOMEN LIMITED RIGHT UPPER QUADRANT COMPARISON:  None. FINDINGS: Gallbladder: Irregular masslike structure within the gallbladder, neoplastic versus tumefactive sludge. 7 mm gallstone identified. No pericholecystic fluid. No sonographic Murphy's sign elicited, per the sonographer. Common bile duct: Diameter: 6 mm Liver: No focal lesion identified. Within normal limits in parenchymal echogenicity. Portal vein is patent on color Doppler imaging with normal direction of blood flow towards the liver. Other: None. IMPRESSION: 1. Irregular masslike structure within the gallbladder, neoplastic process versus tumefactive sludge. 2. Cholelithiasis without evidence of acute cholecystitis. 3. Liver appears normal. Electronically Signed   By: Franki Cabot M.D.   On: 06/10/2019 18:19        Scheduled Meds:  carbidopa-levodopa  1 tablet Oral TID   divalproex  500 mg Oral BID   insulin aspart  0-15 Units Subcutaneous Q4H   Continuous Infusions:  dextrose 5 % and 0.45 % NaCl with KCl 20 mEq/L 125 mL/hr at 06/11/19 0859     LOS: 1 day    Time spent: 40 minutes    [name], DO [facility]   If 7PM-7AM, please contact night-coverage [email or web address] 06/11/2019, 3:47 PM

## 2019-06-11 NOTE — Consult Note (Signed)
Subjective:   CC: cholelithiasis  HPI:  Gilbert DATEMA Sr. is a 79 y.o. male who is consulted by [facility] for evaluation of above cc.  Symptoms were first noted 3 days ago. Pain is dull, constant, located in lower quadrants.  Associated with nothing specific, exacerbated by nothing specific.  Morphine in ED has resolved pain.  No recurrence since. Last BM shortly after pain started, and it did not resolve his pain.     Past Medical History:  has a past medical history of Diabetes mellitus without complication ([place]), Hypertension, Parkinson disease ([place]), and Stroke ([place]).  Past Surgical History:  has a past surgical history that includes No past surgeries and Elbow surgery.  Family History: family history includes Brain cancer in his mother.  Social History:  reports that he has never smoked. He has never used smokeless tobacco. He reports that he does not drink alcohol or use drugs.  Current Medications:  Medications Prior to Admission  Medication Sig Dispense Refill  . amLODipine (NORVASC) 10 MG tablet Take 1 tablet (10 mg total) by mouth daily. 30 tablet 2  . aspirin EC 81 MG EC tablet Take 1 tablet (81 mg total) by mouth daily. 30 tablet 2  . atenolol (TENORMIN) 50 MG tablet Take 50 mg by mouth daily.    Marland Kitchen atorvastatin (LIPITOR) 80 MG tablet Take 40 mg by mouth at bedtime.     . carbidopa-levodopa (SINEMET IR) 25-100 MG tablet Take 1 tablet by mouth 3 (three) times daily.    . divalproex (DEPAKOTE ER) 500 MG 24 hr tablet Take 500 mg by mouth 2 (two) times daily.     Marland Kitchen donepezil (ARICEPT) 10 MG tablet Take 10 mg by mouth daily.    . hydrALAZINE (APRESOLINE) 50 MG tablet Take 50 mg by mouth 3 (three) times daily.     . insulin aspart protamine- aspart (NOVOLOG MIX 70/30) (70-30) 100 UNIT/ML injection Inject 20-45 Units into the skin See admin instructions. Inject 45u under the skin every morning and inject 20u under the skin every night    . levothyroxine (SYNTHROID, LEVOTHROID) 75  MCG tablet Take 75 mcg by mouth daily before breakfast.    . lisinopril (PRINIVIL,ZESTRIL) 40 MG tablet Take 1 tablet (40 mg total) by mouth at bedtime. 30 tablet 2  . vitamin C (ASCORBIC ACID) 500 MG tablet Take 1,000 mg by mouth daily.      Allergies:  Allergies as of 06/10/2019  . (No Known Allergies)    ROS:  General: Denies weight loss, weight gain, fatigue, fevers, chills, and night sweats. Eyes: Denies blurry vision, double vision, eye pain, itchy eyes, and tearing. Ears: Denies hearing loss, earache, and ringing in ears. Nose: Denies sinus pain, congestion, infections, runny nose, and nosebleeds. Mouth/throat: Denies hoarseness, sore throat, bleeding gums, and difficulty swallowing. Heart: Denies chest pain, palpitations, racing heart, irregular heartbeat, leg pain or swelling, and decreased activity tolerance. Respiratory: Denies breathing difficulty, shortness of breath, wheezing, cough, and sputum. GI: Denies change in appetite, heartburn, nausea, vomiting, constipation, diarrhea, and blood in stool. GU: Denies difficulty urinating, pain with urinating, urgency, frequency, blood in urine. Musculoskeletal: Denies joint stiffness, pain, swelling, muscle weakness. Skin: Denies rash, itching, mass, tumors, sores, and boils Neurologic: Denies headache, fainting, dizziness, seizures, numbness, and tingling. Psychiatric: Denies depression, anxiety, difficulty sleeping, and memory loss. Endocrine: Denies heat or cold intolerance, and increased thirst or urination. Blood/lymph: Denies easy bruising, easy bruising, and swollen glands     Objective:  BP (!) 165/77 (BP Location: Left Arm)   Pulse (!) 58   Temp (!) 97.4 F (36.3 C) (Oral)   Resp 18   Ht 5\' 10"  (1.778 m)   Wt 86.2 kg   SpO2 94%   BMI 27.26 kg/m    Constitutional :  alert, cooperative, appears stated age and no distress  Lymphatics/Throat:  no asymmetry, masses, or scars  Respiratory:  clear to  auscultation bilaterally  Cardiovascular:  regular rate and rhythm  Gastrointestinal: soft, non-tender; bowel sounds normal; no masses,  no organomegaly.   Musculoskeletal: Steady movement  Skin: Cool and moist  Psychiatric: Normal affect, non-agitated, not confused       LABS:  CMP Latest Ref Rng & Units 06/11/2019 06/10/2019 12/11/2018  Glucose 70 - 99 mg/dL 166(H) 69(L) 241(H)  BUN 8 - 23 mg/dL 15 17 27(H)  Creatinine 0.61 - 1.24 mg/dL 1.26(H) 1.20 1.71(H)  Sodium 135 - 145 mmol/L 134(L) 134(L) 133(L)  Potassium 3.5 - 5.1 mmol/L 4.0 3.1(L) 3.5  Chloride 98 - 111 mmol/L 100 100 96(L)  CO2 22 - 32 mmol/L 28 24 24   Calcium 8.9 - 10.3 mg/dL 8.0(L) 8.5(L) 7.6(L)  Total Protein 6.5 - 8.1 g/dL 6.6 8.1 -  Total Bilirubin 0.3 - 1.2 mg/dL 4.5(H) 5.4(H) -  Alkaline Phos 38 - 126 U/L 418(H) 476(H) -  AST 15 - 41 U/L 251(H) 340(H) -  ALT 0 - 44 U/L 110(H) 30 -   CBC Latest Ref Rng & Units 06/11/2019 06/10/2019 12/11/2018  WBC 4.0 - 10.5 K/uL 4.7 7.9 9.7  Hemoglobin 13.0 - 17.0 g/dL 12.0(L) 13.8 10.9(L)  Hematocrit 39.0 - 52.0 % 37.9(L) 43.8 33.7(L)  Platelets 150 - 400 K/uL 128(L) 161 128(L)     RADS: CLINICAL DATA:  Nonspecific (abnormal) findings on radiological and other examination of musculoskeletal system  EXAM: 3-DIMENSIONAL MR IMAGE RENDERING ON ACQUISITION WORKSTATION  TECHNIQUE: 3-dimensional MR images were rendered by post-processing of the original MR data on an acquisition workstation. The 3-dimensional MR images were interpreted and findings were reported in the accompanying complete MR report for this study  COMPARISON:  None.  FINDINGS: See report findings on MRI  IMPRESSION: See report impression on accompanying MRI   Electronically Signed   By: Suzy Bouchard M.D.   On: 06/11/2019 08:23  Assessment:      choledocolithiasis Possible GB neoplasm Abdominal pain HTN DM HLD  Plan:    ERCP pending supposedly with GI to remove  choledocholithiasis.  Regarding his cholelithiasis and possible gallbladder neoplasm, I explained to patient and wife multiple times that there is a possibility I may need to defer completing the cholecystectomy if upon examination there is concerns of a gallbladder neoplasm involving his liver.  Because of this possible scenario, did offer to them they can potentially proceed with a interval cholecystectomy at a tertiary care center that can address the possible said risks all at once, versus proceeding with a diagnostic laparoscopy here at [facility] first.  The patient and wife seems to understand the potential scenarios moving forward after the ERCP.  I will discuss with them again in more detail after the ERCP.  In the meantime, patient's wife was very concerned about possible insurance coverage of proceeding with any additional procedures during the patient stay at [facility].  I reached out to the case manager who stated that it will be the patient's responsibility to reach out to her insurance company to confirm what her exact coverage entails.  I relayed this  information to the patient's wife and she verbalized understanding.  She stated that she will go through her insurance agent for further information.

## 2019-06-11 NOTE — Consult Note (Signed)
Gilbert [name] , MD [address], [place], [place], [place], [address] [address], [place], [place], [place], [address] Phone: [phone]  Fax: [phone]  Consultation  Referring Provider:    Dr Arbutus Ped Primary Care Physician:  [name], MD Primary Gastroenterologist:  [facility]          Reason for Consultation:     Abdominal pain   Date of Admission:  06/10/2019 Date of Consultation:  06/11/2019         HPI:   Gilbert DERR Sr. is a 79 y.o. male presented to the emergency room with abdominal pain of 3 days duration.  He has a history of CVA, hypertension, seizures.  Previously has seen surgery for a possible cholecystectomy but apparently patient declined at that time.  Prior imaging 03/14/2019: CT scan of the abdomen and pelvis: Cholelithiasis with mild diffuse gallbladder wall thickening.  Cholecystitis cannot be excluded. 03/21/2019 HIDA scan: No filling of the gallbladder over 2-hour interval.Patent cystic duct. 06/11/2019: Right upper quadrant ultrasound: Irregular masslike structure within the gallbladder.  Neoplastic process versus sludge.  Cholelithiasis without evidence of acute cholecystitis.Common bile duct 6 mm. 06/11/2019: MRCP: Mild intra and extrahepatic biliary duct dilation associated with partially obstructing stones in the common bile duct.  Collection of gallstones and sludge in the neck of the gallbladder with gallbladder neck narrowing.  No gallbladder distention.  Lesions in the fundus of the gallbladder which corresponds to ultrasound abnormality.  Characteristic similar to stones and sludge.  Does not extend beyond the gallbladder wall.  6.  On admission elevated AST and ALT with a total bilirubin of 5.4.  Hb and white cell count normal.  Normal platelet count.  INR 1.0.  Total bilirubin 4.5 today.  Alkaline phosphatase 418.  Transaminases still elevated today.  On admission noted to have hypertensive emergency.   He says that he had  abdominal pain all over his abdomen for 3 days and then stopped all of a sudden.  Denies any fever.  At this point of time no pain whatsoever.  Not on any blood thinners.  Past Medical History:  Diagnosis Date  . Diabetes mellitus without complication ([place])   . Hypertension   . Parkinson disease ([place])   . Stroke [facility])     Past Surgical History:  Procedure Laterality Date  . ELBOW SURGERY    . NO PAST SURGERIES      Prior to Admission medications   Medication Sig Start Date End Date Taking? Authorizing Provider  amLODipine (NORVASC) 10 MG tablet Take 1 tablet (10 mg total) by mouth daily. 05/10/16  Yes Gladstone Lighter, MD  aspirin EC 81 MG EC tablet Take 1 tablet (81 mg total) by mouth daily. 05/10/16  Yes Gladstone Lighter, MD  atenolol (TENORMIN) 50 MG tablet Take 50 mg by mouth daily.   Yes [provider]  atorvastatin (LIPITOR) 80 MG tablet Take 40 mg by mouth at bedtime.    Yes [provider]  carbidopa-levodopa (SINEMET IR) 25-100 MG tablet Take 1 tablet by mouth 3 (three) times daily. 12/07/18  Yes [provider]  divalproex (DEPAKOTE ER) 500 MG 24 hr tablet Take 500 mg by mouth 2 (two) times daily.    Yes [provider]  donepezil (ARICEPT) 10 MG tablet Take 10 mg by mouth daily. 11/23/18  Yes [provider]  hydrALAZINE (APRESOLINE) 50 MG tablet Take 50 mg by mouth 3 (three) times daily.  09/28/18  Yes [provider]  insulin aspart protamine- aspart (NOVOLOG MIX 70/30) (70-30) 100 UNIT/ML injection Inject 20-45 Units into the skin See admin instructions. Inject 45u under the skin every morning and inject 20u under the skin every night   Yes [provider]  levothyroxine (SYNTHROID, LEVOTHROID) 75 MCG tablet Take 75 mcg by mouth daily before breakfast.   Yes [provider]  lisinopril (PRINIVIL,ZESTRIL) 40 MG tablet Take 1 tablet (40 mg total) by mouth at bedtime. 05/10/16  Yes Gladstone Lighter, MD    vitamin C (ASCORBIC ACID) 500 MG tablet Take 1,000 mg by mouth daily.   Yes [provider]    Family History  Problem Relation Age of Onset  . Brain cancer Mother   . Prostate cancer Neg Hx   . Bladder Cancer Neg Hx   . Kidney cancer Neg Hx      Social History   Tobacco Use  . Smoking status: Never Smoker  . Smokeless tobacco: Never Used  Substance Use Topics  . Alcohol use: No  . Drug use: No    Allergies as of 06/10/2019  . (No Known Allergies)    Review of Systems:    All systems reviewed and negative except where noted in HPI.   Physical Exam:  Vital signs in last 24 hours: Temp:  [97.4 F (36.3 C)-97.8 F (36.6 C)] 97.4 F (36.3 C) (02/15 0429) Pulse Rate:  [58-86] 58 (02/15 0429) Resp:  [18-25] 18 (02/15 0429) BP: (165-206)/(67-111) 165/77 (02/15 0429) SpO2:  [91 %-97 %] 94 % (02/15 0429) Weight:  [86.2 kg] 86.2 kg (02/14 1513)   General:   Pleasant, cooperative in NAD Head:  Normocephalic and atraumatic. Eyes:   No icterus.   Conjunctiva pink. PERRLA. Ears:  Normal auditory acuity. Neck:  Supple; no masses or thyroidomegaly Lungs: Respirations even and unlabored. Lungs clear to auscultation bilaterally.   No wheezes, crackles, or rhonchi.  Heart:  Regular rate and rhythm;  Without murmur, clicks, rubs or gallops Abdomen:  Soft, nondistended, nontender. Normal bowel sounds. No appreciable masses or hepatomegaly.  No rebound or guarding.  Neurologic:  Alert and oriented x3;  grossly normal neurologically. Skin:  Intact without significant lesions or rashes. Cervical Nodes:  No significant cervical adenopathy. Psych:  Alert and cooperative. Normal affect.  LAB RESULTS: Recent Labs    06/10/19 1517 06/11/19 0540  WBC 7.9 4.7  HGB 13.8 12.0*  HCT 43.8 37.9*  PLT 161 128*   BMET Recent Labs    06/10/19 1517 06/11/19 0540  NA 134* 134*  K 3.1* 4.0  CL 100 100  CO2 24 28  GLUCOSE 69* 166*  BUN 17 15  CREATININE 1.20 1.26*  CALCIUM  8.5* 8.0*   LFT Recent Labs    06/11/19 0540  PROT 6.6  ALBUMIN 3.0*  AST 251*  ALT 110*  ALKPHOS 418*  BILITOT 4.5*   PT/INR Recent Labs    06/11/19 0540  LABPROT 13.3  INR 1.0    STUDIES: MR 3D Recon At Scanner  Result Date: 06/11/2019 CLINICAL DATA:  Nonspecific (abnormal) findings on radiological and other examination of musculoskeletal system EXAM: 3-DIMENSIONAL MR IMAGE RENDERING ON ACQUISITION WORKSTATION TECHNIQUE: 3-dimensional MR images were rendered by post-processing of the original MR data on an acquisition workstation. The 3-dimensional MR images were interpreted and findings were reported in the accompanying complete MR report for this study COMPARISON:  None. FINDINGS: See report findings on MRI IMPRESSION: See report impression on accompanying MRI Electronically Signed  By: Suzy Bouchard M.D.   On: 06/11/2019 08:23   MR ABDOMEN MRCP W WO CONTAST  Result Date: 06/11/2019 CLINICAL DATA:  Periumbilical pain. Abdominal pain. Cholelithiasis. EXAM: MRI ABDOMEN WITHOUT AND WITH CONTRAST (INCLUDING MRCP) TECHNIQUE: Multiplanar multisequence MR imaging of the abdomen was performed both before and after the administration of intravenous contrast. Heavily T2-weighted images of the biliary and pancreatic ducts were obtained, and three-dimensional MRCP images were rendered by post processing. CONTRAST:  7.89mL GADAVIST GADOBUTROL 1 MMOL/ML IV SOLN COMPARISON:  Ultrasound 06/10/2019, HIDA scan 03/21/2019, CT 03/14/2019 FINDINGS: Lower chest:  Lung bases are clear. Hepatobiliary: Mild intrahepatic biliary duct dilatation. The extrahepatic ducts are mildly dilated. The common hepatic duct measures 8 mm. Common bile duct measures 8 mm. There are multiple round filling defects within the mid and distal common bile duct (image 18/11). Approximately 5 small filling defects measuring approximately 4-5 mm each consistent choledocholithiasis. These distal stones have high signal intensity on  noncontrast T1 weighted imaging (image 46/19) typical of cholesterol stones. Within the neck of the gallbladder, there is similar cluster of material tissue which has high signal intensity on noncontrast T1 weighted imaging (image 39/9). There are gallstones collecting in the neck the gallbladder on comparison CT. Favor mixture of stones and sludge with some chronic inflammation. Chronic narrowing of the neck of the gallbladder associated the stones in probable sludge. Within the fundus of the gallbladder there is wall adherent lesion measuring approximately 10 mm (image 18/10). This lesion also has inherent T1 shortening similar to the stones and sludge in the neck of the gallbladder and common bile duct stones. Pancreas: Normal pancreatic parenchymal intensity. No ductal dilatation or inflammation. Spleen: Normal spleen. Adrenals/urinary tract: Adrenal glands and kidneys are normal. Stomach/Bowel: Stomach and limited of the small bowel is unremarkable Vascular/Lymphatic: Abdominal aortic normal caliber. No retroperitoneal periportal lymphadenopathy. Musculoskeletal: No aggressive osseous lesion IMPRESSION: 1. Mild intra and extrahepatic biliary duct dilatation associated with partially obstructing stones in the common bile duct. 2. Collection of gallstones and sludge in the neck of the gallbladder with chronic gallbladder neck narrowing. No gallbladder distension. 3. Lesion in the fundus of the gallbladder which corresponds to ultrasound abnormality. This lesion has similar imaging characteristics to the stones and sludge in the neck the gallbladder and favored wall adherent sludge and stones. Cannot completely exclude gallbladder neoplasm. No evidence of extension of the process beyond the gallbladder wall. Electronically Signed   By: Suzy Bouchard M.D.   On: 06/11/2019 08:16   US Abdomen Limited RUQ  Result Date: 06/10/2019 CLINICAL DATA:  Upper abdominal pain for 3 days. EXAM: ULTRASOUND ABDOMEN LIMITED  RIGHT UPPER QUADRANT COMPARISON:  None. FINDINGS: Gallbladder: Irregular masslike structure within the gallbladder, neoplastic versus tumefactive sludge. 7 mm gallstone identified. No pericholecystic fluid. No sonographic Murphy's sign elicited, per the sonographer. Common bile duct: Diameter: 6 mm Liver: No focal lesion identified. Within normal limits in parenchymal echogenicity. Portal vein is patent on color Doppler imaging with normal direction of blood flow towards the liver. Other: None. IMPRESSION: 1. Irregular masslike structure within the gallbladder, neoplastic process versus tumefactive sludge. 2. Cholelithiasis without evidence of acute cholecystitis. 3. Liver appears normal. Electronically Signed   By: Franki Cabot M.D.   On: 06/10/2019 18:19      Impression / Plan:   Gilbert MARKWOOD Sr. is a 79 y.o. y/o male with prior history of recurrent abdominal pain and [facility].  Previously had been recommended cholecystectomy but patient  declined.  On this admission of 3 days of abdominal pain on admission noted to have a dilated common bile duct with stones in the common bile duct and elevated transaminases and total bilirubin of 5.4 a normal lipase level.  Some concern in the past for a masslike lesion in the gallbladder.  Present imaging suggest is probably gallstones.  Plan 1.  ERCP tomorrow.  Stop all blood thinners and heparin prior to procedure. 2.  Will need surgical evaluation to determine if the gallbladder needs to come out before the patient is discharged.    I have discussed alternative options, risks & benefits,  which include, but are not limited to, bleeding, infection, perforation,respiratory complication, drug reaction, pancreatitis and death.  The patient agrees with this plan & written consent will be obtained.   Thank you for involving me in the care of this patient.      LOS: 1 day   Gilbert Bellows, MD  06/11/2019, 11:19 AM

## 2019-06-11 NOTE — Consult Note (Addendum)
Consultation Note Date: 06/11/2019   Patient Name: Gilbert GUST Sr.  DOB: Apr 26, 1941  MRN: [id number]  Age / Sex: 79 y.o., male   PCP: [name], MD Referring Physician: [name], DO   REASON FOR CONSULTATION:Establishing goals of care  Palliative Care consult requested for goals of care discussion in this 79 y.o. male with multiple medical problems including diabetes, hypertension, Parkinson, CVA, and seizure disorder. Patient presented to ED from home with complaints of 3 days history of abdominal pain worse after eating. During ED work-up liver enzymes found to be elevated. Right upper quadrant ultrasound showed an irregular masslike structure within the gallbladder, neoplastic versus tumefactive sludge, 7 mm gallstone was seen and common bile duct dilated to 6 mm. Patient administered morphine for pain in the ED which he reports helped.   Clinical Assessment and Goals of Care: I have reviewed medical records including lab results, imaging, Epic notes, and MAR, received report from the bedside RN, and assessed the patient. I met at the bedside with Gilbert [name]  to discuss diagnosis prognosis, GOC, EOL wishes, disposition and options. He was awake, A&O x4, watching tv. He denies pain or shortness of breath.   Assisted to the side of the bed and to stand and use urinal. No complications noted. Slightly unsteady when standing requiring standby support which he reports is baseline since his CVA.   I introduced Palliative Medicine as specialized medical care for people living with serious illness. It focuses on providing relief from the symptoms and stress of a serious illness. The goal is to improve quality of life for both the patient and the family. Patient verbalized understanding. Offered to include his wife in discussion. He reported she was home preparing to come up and felt he could relay any significant information to her or have her call with questions.   We  discussed a brief life review of the patient, along with his functional and nutritional status. Gilbert [name] reports he is originally from [place]. 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 He and his wife have been married for 61 years and have 6 children, 82 grandchildren, and 7 great grands which he shares he is proud of.   Prior to admission patient reports being able to perform all ADLs independently. He does endorse gait instability and high risk of falling. He does not use a cane or walker but does have one in the home from previous needs. He reports prior to his recent illness his appetite was good and he was able to eat 3 meals a day with several snacks in between. He is able to drive although he does not drive often, stating his wife does mostly.   We discussed His current illness and what it means in the larger context of His on-going co-morbidities. With specific discussions regarding his current illness. Natural disease trajectory and expectations at EOL were discussed.  Gilbert [name] is able to verbalize his understanding of his current illness. He shares he is hopeful he can feel better soon and get back to his normal life. He understands possibility of neoplasm. He reports wishes to proceed with all necessary work-up stating he and his wife have discussed.   I attempted to elicit values and goals of care important to the patient.    The difference between aggressive medical intervention and comfort care was considered in light of the patient's goals of care.Patient  is requesting aggressive medical intervention. He reports he is awaiting surgery team to come and speak with him and update him with plan of care.   Advanced directives, concepts specific to code status, artifical feeding and hydration, and rehospitalization were considered and discussed. Patient reports he does not have a documented advanced directive. He reports he and his  wife are in the process of completing these documents. His states his wife, Milana Kidney would be his designated HCPOA.   I discussed with patient his current full code status at length with consideration of his current illness and co-morbidities. Gilbert [name] reports at this time he wishes to remain a full code and receive all heroic measures. He does share he would not want to be on prolonged life support or machinery and in that situation he would prefer a natural dying process.   Questions and concerns were addressed.  Patient was encouraged to call with questions or concerns.  PMT will continue to support holistically.   SOCIAL HISTORY:     reports that he has never smoked. He has never used smokeless tobacco. He reports that he does not drink alcohol or use drugs.  CODE STATUS: Full code  ADVANCE DIRECTIVES: Wife   SYMPTOM MANAGEMENT: per attending   Palliative Prophylaxis:   Bowel Regimen and Frequent Pain Assessment  PSYCHO-SOCIAL/SPIRITUAL:  Support System: Family  Desire for further Chaplaincy support: No   Additional Recommendations (Limitations, Scope, Preferences):  Full Scope Treatment   PAST MEDICAL HISTORY: Past Medical History:  Diagnosis Date  . Diabetes mellitus without complication ([place])   . Hypertension   . Parkinson disease ([place])   . Stroke [facility])     PAST SURGICAL HISTORY:  Past Surgical History:  Procedure Laterality Date  . ELBOW SURGERY    . NO PAST SURGERIES      ALLERGIES:  has No Known Allergies.   MEDICATIONS:  Current Facility-Administered Medications  Medication Dose Route Frequency Provider Last Rate Last Admin  . acetaminophen (TYLENOL) tablet 650 mg  650 mg Oral Q6H PRN  Masse, MD       Or  . acetaminophen (TYLENOL) suppository 650 mg  650 mg Rectal Q6H PRN Judd Gaudier V, MD      . dextrose 5 % and 0.45 % NaCl with KCl 20 mEq/L infusion   Intravenous Continuous Sharion Settler, NP 125 mL/hr at 06/11/19 0859 Rate  Verify at 06/11/19 0859  . enoxaparin (LOVENOX) injection 40 mg  40 mg Subcutaneous Q24H Judd Gaudier V, MD      . hydrALAZINE (APRESOLINE) injection 10 mg  10 mg Intravenous Q6H PRN Sharion Settler, NP      . insulin aspart (novoLOG) injection 0-15 Units  0-15 Units Subcutaneous Q4H  Masse, MD   3 Units at 06/11/19 [phone]  . labetalol (NORMODYNE) injection 10 mg  10 mg Intravenous Q4H PRN Judd Gaudier V, MD      . morphine 2 MG/ML injection 2 mg  2 mg Intravenous Q2H PRN  Masse, MD      . ondansetron [facility]) tablet 4 mg  4 mg Oral Q6H PRN  Masse, MD       Or  . ondansetron [facility]) injection 4 mg  4 mg Intravenous Q6H PRN  Masse, MD        VITAL SIGNS: BP (!) 165/77 (BP Location: Left Arm)   Pulse (!) 58   Temp (!) 97.4 F (36.3 C) (Oral)   Resp 18   Ht 5'  10" (1.778 m)   Wt 86.2 kg   SpO2 94%   BMI 27.26 kg/m  Filed Weights   06/10/19 1513  Weight: 86.2 kg    Estimated body mass index is 27.26 kg/m as calculated from the following:   Height as of this encounter: 5' 10"  (1.778 m).   Weight as of this encounter: 86.2 kg.  LABS: CBC:    Component Value Date/Time   WBC 4.7 06/11/2019 0540   HGB 12.0 (L) 06/11/2019 0540   HCT 37.9 (L) 06/11/2019 0540   PLT 128 (L) 06/11/2019 0540   Comprehensive Metabolic Panel:    Component Value Date/Time   NA 134 (L) 06/11/2019 0540   K 4.0 06/11/2019 0540   CO2 28 06/11/2019 0540   BUN 15 06/11/2019 0540   CREATININE 1.26 (H) 06/11/2019 0540   ALBUMIN 3.0 (L) 06/11/2019 0540     Review of Systems  Musculoskeletal: Positive for gait problem.  Neurological: Positive for weakness.  Psychiatric/Behavioral: Positive for hallucinations.  All other systems reviewed and are negative. Unless otherwise noted, a complete review of systems is negative.  Physical Exam General: NAD Cardiovascular: regular rate and rhythm Pulmonary: clear ant fields Abdomen: soft, mild tenderness, + bowel  sounds Extremities: no edema, no joint deformities Skin: no rashes Neurological: awake, A&O x3, mood appropriate   Prognosis: Unable to determine  Discharge Planning:  To be Determined   Recommendations:  Full Code-as requested by patient  Continue with current plan of care per medical team  Patient remains hopeful for improvement. Requesting full scope/aggressive care. Waiting evaluation from Surgery team. States he is in full agreement with all recommendations/surgery at this point.   PMT will continue to support and follow as needed. Please call for urgent needs.    Palliative Performance Scale: PPS 30-40% NPO in preparation for Surgery eval                Patient expressed understanding and was in agreement with this plan.   Thank you for allowing the Palliative Medicine Team to assist in the care of this patient.  Time In: 1230 Time Out: 1320 Time Total: 50 min.   Visit consisted of counseling and education dealing with the complex and emotionally intense issues of symptom management and palliative care in the setting of serious and potentially life-threatening illness.Greater than 50%  of this time was spent counseling and coordinating care related to the above assessment and plan.  Signed by:  Alda Lea, AGPCNP-BC Palliative Medicine Team  Phone: [phone] Fax: [phone] Pager: [phone] Amion: Bjorn Pippin

## 2019-06-12 ENCOUNTER — Inpatient Hospital Stay: Payer: PPO | Admitting: Anesthesiology

## 2019-06-12 ENCOUNTER — Inpatient Hospital Stay: Payer: PPO

## 2019-06-12 ENCOUNTER — Encounter
Admission: EM | Disposition: A | Discharge status: Home / Self Care | Payer: Self-pay | Source: Home / Self Care | Attending: Internal Medicine

## 2019-06-12 ENCOUNTER — Encounter: Payer: Self-pay | Admitting: Internal Medicine

## 2019-06-12 HISTORY — PX: ENDOSCOPIC RETROGRADE CHOLANGIOPANCREATOGRAPHY (ERCP) WITH PROPOFOL: SHX5810

## 2019-06-12 LAB — COMPREHENSIVE METABOLIC PANEL
ALT: 44 U/L (ref 0–44)
AST: 144 U/L — ABNORMAL HIGH (ref 15–41)
Albumin: 2.8 g/dL — ABNORMAL LOW (ref 3.5–5.0)
Alkaline Phosphatase: 364 U/L — ABNORMAL HIGH (ref 38–126)
Anion gap: 9 (ref 5–15)
BUN: 12 mg/dL (ref 8–23)
CO2: 23 mmol/L (ref 22–32)
Calcium: 8 mg/dL — ABNORMAL LOW (ref 8.9–10.3)
Chloride: 106 mmol/L (ref 98–111)
Creatinine, Ser: 1.22 mg/dL (ref 0.61–1.24)
GFR calc Af Amer: >60 mL/min (ref >60)
GFR calc non Af Amer: 56 mL/min — ABNORMAL LOW (ref >60)
Glucose, Bld: 165 mg/dL — ABNORMAL HIGH (ref 70–99)
Potassium: 3.6 mmol/L (ref 3.5–5.1)
Sodium: 138 mmol/L (ref 135–145)
Total Bilirubin: 2 mg/dL — ABNORMAL HIGH (ref 0.3–1.2)
Total Protein: 6.1 g/dL — ABNORMAL LOW (ref 6.5–8.1)

## 2019-06-12 LAB — GLUCOSE, CAPILLARY
Glucose-Capillary: 135 mg/dL — ABNORMAL HIGH (ref 70–99)
Glucose-Capillary: 144 mg/dL — ABNORMAL HIGH (ref 70–99)
Glucose-Capillary: 149 mg/dL — ABNORMAL HIGH (ref 70–99)
Glucose-Capillary: 175 mg/dL — ABNORMAL HIGH (ref 70–99)
Glucose-Capillary: 202 mg/dL — ABNORMAL HIGH (ref 70–99)
Glucose-Capillary: 258 mg/dL — ABNORMAL HIGH (ref 70–99)
Glucose-Capillary: 277 mg/dL — ABNORMAL HIGH (ref 70–99)

## 2019-06-12 LAB — CBC
HCT: 36.6 % — ABNORMAL LOW (ref 39.0–52.0)
Hemoglobin: 11.5 g/dL — ABNORMAL LOW (ref 13.0–17.0)
MCH: 27.4 pg (ref 26.0–34.0)
MCHC: 31.4 g/dL (ref 30.0–36.0)
MCV: 87.4 fL (ref 80.0–100.0)
Platelets: 125 10*3/uL — ABNORMAL LOW (ref 150–400)
RBC: 4.19 MIL/uL — ABNORMAL LOW (ref 4.22–5.81)
RDW: 15.8 % — ABNORMAL HIGH (ref 11.5–15.5)
WBC: 4.6 10*3/uL (ref 4.0–10.5)
nRBC: 0 % (ref 0.0–0.2)

## 2019-06-12 LAB — MAGNESIUM: Magnesium: 1.6 mg/dL — ABNORMAL LOW (ref 1.7–2.4)

## 2019-06-12 SURGERY — ENDOSCOPIC RETROGRADE CHOLANGIOPANCREATOGRAPHY (ERCP) WITH PROPOFOL
Anesthesia: General

## 2019-06-12 MED ORDER — INDOMETHACIN 50 MG RE SUPP: 50.0000 mg | Freq: Once | RECTAL | Status: DC

## 2019-06-12 MED ORDER — LISINOPRIL 20 MG PO TABS
20.0000 mg | ORAL_TABLET | Freq: Every day | ORAL | Status: DC
  Administered 2019-06-12 – 2019-06-15 (×4): 20 mg via ORAL
  Filled 2019-06-12 (×4): qty 1

## 2019-06-12 MED ORDER — SUCCINYLCHOLINE CHLORIDE 20 MG/ML IJ SOLN
INTRAMUSCULAR | Status: DC | PRN
  Administered 2019-06-12: 100 mg via INTRAVENOUS

## 2019-06-12 MED ORDER — LACTATED RINGERS IV SOLN: INTRAVENOUS | Status: DC

## 2019-06-12 MED ORDER — INDOMETHACIN 50 MG RE SUPP
100.0000 mg | Freq: Once | RECTAL | Status: AC
  Administered 2019-06-12: 13:00:00 100 mg via RECTAL

## 2019-06-12 MED ORDER — ROCURONIUM BROMIDE 100 MG/10ML IV SOLN
INTRAVENOUS | Status: DC | PRN
  Administered 2019-06-12: 10 mg via INTRAVENOUS
  Administered 2019-06-12: 30 mg via INTRAVENOUS

## 2019-06-12 MED ORDER — INDOMETHACIN 50 MG RE SUPP
RECTAL | Status: AC
  Filled 2019-06-12: qty 2

## 2019-06-12 MED ORDER — LABETALOL HCL 5 MG/ML IV SOLN
10.0000 mg | Freq: Once | INTRAVENOUS | Status: AC
  Administered 2019-06-12: 14:00:00 10 mg via INTRAVENOUS
  Filled 2019-06-12: qty 4

## 2019-06-12 MED ORDER — DEXAMETHASONE SODIUM PHOSPHATE 10 MG/ML IJ SOLN
INTRAMUSCULAR | Status: DC | PRN
  Administered 2019-06-12: 10 mg via INTRAVENOUS

## 2019-06-12 MED ORDER — LIDOCAINE HCL (CARDIAC) PF 100 MG/5ML IV SOSY
PREFILLED_SYRINGE | INTRAVENOUS | Status: DC | PRN
  Administered 2019-06-12: 80 mg via INTRAVENOUS

## 2019-06-12 MED ORDER — AMLODIPINE BESYLATE 10 MG PO TABS
10.0000 mg | ORAL_TABLET | Freq: Every day | ORAL | Status: DC
  Administered 2019-06-12 – 2019-06-15 (×4): 10 mg via ORAL
  Filled 2019-06-12 (×4): qty 1

## 2019-06-12 MED ORDER — EPINEPHRINE 1 MG/10ML IJ SOSY
PREFILLED_SYRINGE | INTRAMUSCULAR | Status: DC | PRN
  Administered 2019-06-12: 0.3 mg via SUBCUTANEOUS

## 2019-06-12 MED ORDER — ONDANSETRON HCL 4 MG/2ML IJ SOLN
INTRAMUSCULAR | Status: DC | PRN
  Administered 2019-06-12: 4 mg via INTRAVENOUS

## 2019-06-12 MED ORDER — FENTANYL CITRATE (PF) 100 MCG/2ML IJ SOLN: 25.0000 ug | INTRAMUSCULAR | Status: DC | PRN

## 2019-06-12 MED ORDER — FENTANYL CITRATE (PF) 100 MCG/2ML IJ SOLN
INTRAMUSCULAR | Status: DC | PRN
  Administered 2019-06-12: 50 ug via INTRAVENOUS
  Administered 2019-06-12 (×2): 25 ug via INTRAVENOUS

## 2019-06-12 MED ORDER — MAGNESIUM SULFATE 2 GM/50ML IV SOLN
2.0000 g | Freq: Once | INTRAVENOUS | Status: AC
  Administered 2019-06-12: 2 g via INTRAVENOUS
  Filled 2019-06-12: qty 50

## 2019-06-12 MED ORDER — LACTATED RINGERS IV SOLN: INTRAVENOUS | Status: DC | PRN

## 2019-06-12 MED ORDER — FENTANYL CITRATE (PF) 100 MCG/2ML IJ SOLN
INTRAMUSCULAR | Status: AC
  Filled 2019-06-12: qty 2

## 2019-06-12 MED ORDER — SUGAMMADEX SODIUM 200 MG/2ML IV SOLN
INTRAVENOUS | Status: DC | PRN
  Administered 2019-06-12: 172.4 mg via INTRAVENOUS

## 2019-06-12 MED ORDER — LABETALOL HCL 5 MG/ML IV SOLN
INTRAVENOUS | Status: AC
  Filled 2019-06-12: qty 4

## 2019-06-12 MED ORDER — PROPOFOL 10 MG/ML IV BOLUS
INTRAVENOUS | Status: DC | PRN
  Administered 2019-06-12: 60 mg via INTRAVENOUS
  Administered 2019-06-12: 40 mg via INTRAVENOUS

## 2019-06-12 MED ORDER — HYDRALAZINE HCL 50 MG PO TABS
50.0000 mg | ORAL_TABLET | Freq: Three times a day (TID) | ORAL | Status: DC
  Administered 2019-06-12 – 2019-06-15 (×8): 50 mg via ORAL
  Filled 2019-06-12 (×9): qty 1

## 2019-06-12 NOTE — Transfer of Care (Signed)
Immediate Anesthesia Transfer of Care Note  Patient: Gilbert Finner Sr.  Procedure(s) Performed: ENDOSCOPIC RETROGRADE CHOLANGIOPANCREATOGRAPHY (ERCP) WITH PROPOFOL (N/A )  Patient Location: PACU  Anesthesia Type:General  Level of Consciousness: awake, alert  and oriented  Airway & Oxygen Therapy: Patient Spontanous Breathing and Patient connected to face mask oxygen  Post-op Assessment: Report given to RN and Post -op Vital signs reviewed and stable  Post vital signs: Reviewed and stable  Last Vitals:  Vitals Value Taken Time  BP 202/69 06/12/19 1400  Temp 36.6 C 06/12/19 1354  Pulse 67 06/12/19 1400  Resp 15 06/12/19 1400  SpO2 94 % 06/12/19 1400  Vitals shown include unvalidated device data.  Last Pain:  Vitals:   06/12/19 1145  TempSrc: Temporal  PainSc: 0-No pain         Complications: No apparent anesthesia complications

## 2019-06-12 NOTE — Progress Notes (Signed)
PT Cancellation Note  Patient Details Name: Gilbert VALLEAU Sr. MRN: [id number] DOB: 08-15-1940   Cancelled Treatment:    Reason Eval/Treat Not Completed: Other (comment)(Pt out of room at this time. PT to follow up as able.)   Lieutenant Diego PT, DPT 1:00 PM,06/12/19

## 2019-06-12 NOTE — Anesthesia Procedure Notes (Signed)
Procedure Name: Intubation Date/Time: 06/12/2019 12:58 PM Performed by: Nelda Marseille, CRNA Pre-anesthesia Checklist: Patient identified, Patient being monitored, Timeout performed, Emergency Drugs available and Suction available Patient Re-evaluated:Patient Re-evaluated prior to induction Oxygen Delivery Method: Circle system utilized Preoxygenation: Pre-oxygenation with 100% oxygen Induction Type: IV induction Ventilation: Mask ventilation without difficulty Laryngoscope Size: Mac and 3 Grade View: Grade II Tube type: Oral Tube size: 7.5 mm Number of attempts: 1 Airway Equipment and Method: Stylet Placement Confirmation: ETT inserted through vocal cords under direct vision,  positive ETCO2 and breath sounds checked- equal and bilateral Secured at: 21 cm Tube secured with: Tape Dental Injury: Teeth and Oropharynx as per pre-operative assessment

## 2019-06-12 NOTE — Op Note (Signed)
[facility] Gastroenterology Patient Name: Gilbert [name] Procedure Date: 06/12/2019 12:30 PM MRN: [id number] Account #: [id number] Date of Birth: Oct 11, 1940 Admit Type: Inpatient Age: 79 Room: [facility] ENDO ROOM 4 Gender: Male Note Status: Finalized Procedure:             ERCP Indications:           Common bile duct stone(s) Providers:             Lucilla Lame MD, MD Referring MD:          [name], MD (Referring MD) Medicines:             General Anesthesia Complications:         Significant bleeding - required epinephrine Procedure:             Pre-Anesthesia Assessment:                        - Prior to the procedure, a History and Physical was                         performed, and patient medications and allergies were                         reviewed. The patient's tolerance of previous                         anesthesia was also reviewed. The risks and benefits                         of the procedure and the sedation options and risks                         were discussed with the patient. All questions were                         answered, and informed consent was obtained. Prior                         Anticoagulants: The patient has taken anticoagulant                         medication, last dose was 1 day prior to procedure.                         ASA Grade Assessment: III - A patient with severe                         systemic disease. After reviewing the risks and                         benefits, the patient was deemed in satisfactory                         condition to undergo the procedure.                        After obtaining informed consent, the scope was passed  under direct vision. Throughout the procedure, the                         patient's blood pressure, pulse, and oxygen                         saturations were monitored continuously. The                         Duodenoscope was introduced through the  mouth, and                         used to inject contrast into and used to inject                         contrast into the bile duct. The ERCP was accomplished                         without difficulty. The patient tolerated the                         procedure well. Findings:      The scout film was normal. The esophagus was successfully intubated       under direct vision. The scope was advanced to a normal major papilla in       the descending duodenum without detailed examination of the pharynx,       larynx and associated structures, and upper GI tract. The upper GI tract       was grossly normal. The bile duct was deeply cannulated with the       short-nosed traction sphincterotome. Contrast was injected. I personally       interpreted the bile duct images. There was brisk flow of contrast       through the ducts. Image quality was excellent. Contrast extended to the       hepatic ducts. The main bile duct contained four stones. A wire was       passed into the biliary tree. A 7 mm biliary sphincterotomy was made       with a traction (standard) sphincterotome using ERBE electrocautery.       Severe bleeding from the sphincterotomy continued for over 5 minutes.       The biliary tree was swept with a 15 mm balloon starting at the       bifurcation. Four stones were removed. No stones remained. Area was       successfully injected with 3 mL of a 1:10,000 solution of epinephrine       through the ERCP scope for hemostasis. Two 10 Fr by 7 cm plastic stents       with a single external flap and a single internal flap were placed 2 cm       into the common bile duct. Bile flowed through the stents. The stents       were in good position. Impression:            - An area successfully injected.                        - Choledocholithiasis was found. Complete removal was  accomplished by biliary sphincterotomy and balloon                         extraction.                         - A biliary sphincterotomy was performed.                        - The biliary tree was swept.                        - Two plastic stents were placed into the common bile                         duct. Recommendation:        - Return patient to hospital ward for ongoing care.                        - Clear liquid diet.                        - Hold any and all blood thinners.                        - Watch for pancreatitis, bleeding, perforation, and                         cholangitis.                        - Repeat ERCP in 1 month to remove stent. Procedure Code(s):     --- Professional ---                        [phone], Endoscopic retrograde cholangiopancreatography                         (ERCP); with placement of endoscopic stent into                         biliary or pancreatic duct, including pre- and                         post-dilation and guide wire passage, when performed,                         including sphincterotomy, when performed, each stent                        [id number], 23, Endoscopic retrograde                         cholangiopancreatography (ERCP); with placement of                         endoscopic stent into biliary or pancreatic duct,                         including pre- and post-dilation and guide wire  passage, when performed, including sphincterotomy,                         when performed, each stent                        [id number], Endoscopic retrograde cholangiopancreatography                         (ERCP); with removal of calculi/debris from                         biliary/pancreatic duct(s)                        [phone], Endoscopic catheterization of the biliary                         ductal system, radiological supervision and                         interpretation                        [phone], Unlisted procedure, biliary tract Diagnosis Code(s):     --- Professional ---                        K80.50, Calculus  of bile duct without cholangitis or                         cholecystitis without obstruction CPT copyright 2019 American Medical Association. All rights reserved. The codes documented in this report are preliminary and upon coder review may  be revised to meet current compliance requirements. Lucilla Lame MD, MD 06/12/2019 1:39:01 PM This report has been signed electronically. Number of Addenda: 0 Note Initiated On: 06/12/2019 12:30 PM Estimated Blood Loss:  Estimated blood loss: none. Estimated blood loss: 15                         mL requiring treatment with epinephrine. Estimated                         blood loss: 15 mL requiring treatment with coagulation.      [facility]

## 2019-06-12 NOTE — Anesthesia Preprocedure Evaluation (Addendum)
Anesthesia Evaluation  Patient identified by MRN, date of birth, ID band Patient awake    Reviewed: Allergy & Precautions, H&P , NPO status , Patient's Chart, lab work & pertinent test results  Airway Mallampati: III  TM Distance: >3 FB Neck ROM: full    Dental  (+) Chipped, Poor Dentition, Missing   Pulmonary neg pulmonary ROS, neg COPD,           Cardiovascular hypertension, (-) angina(-) Past MI (-) dysrhythmias      Neuro/Psych Seizures -, Well Controlled,  Parkinson's CVA, Residual Symptoms negative psych ROS   GI/Hepatic negative GI ROS, Neg liver ROS,   Endo/Other  diabetes  Renal/GU negative Renal ROS  negative genitourinary   Musculoskeletal   Abdominal   Peds  Hematology negative hematology ROS (+)   Anesthesia Other Findings Past Medical History: No date: Diabetes mellitus without complication ([facility]) No date: Hypertension No date: Parkinson disease ([place]) No date: Stroke [facility])  Past Surgical History: No date: ELBOW SURGERY No date: NO PAST SURGERIES  BMI    Body Mass Index: 27.27 kg/m      Reproductive/Obstetrics negative OB ROS                            Anesthesia Physical Anesthesia Plan  ASA: III  Anesthesia Plan: General ETT   Post-op Pain Management:    Induction:   PONV Risk Score and Plan:   Airway Management Planned:   Additional Equipment:   Intra-op Plan:   Post-operative Plan:   Informed Consent: I have reviewed the patients History and Physical, chart, labs and discussed the procedure including the risks, benefits and alternatives for the proposed anesthesia with the patient or authorized representative who has indicated his/her understanding and acceptance.     Dental Advisory Given  Plan Discussed with: Anesthesiologist  Anesthesia Plan Comments:         Anesthesia Quick Evaluation

## 2019-06-12 NOTE — Progress Notes (Addendum)
PROGRESS NOTE    JAQUIS PICKLESIMER Sr.  FYB:[id number] DOB: 1940/07/19 DOA: 06/10/2019  PCP: [name], MD    LOS - 2   Brief Narrative:  Enedina Finner Sr.is a 79 y.o.malewith medical history significant fordiabetes, hypertension, history of CVA and seizure disorder, who presented to the ED on 2/14 with3-day history of periumbilicalmid abdominal pain crampy and of moderate to severe intensitynon-radiating worse after eating.  Patient had recently been evaluated by surgery in December 2020 for left lower quadrant pain and abnormal gallbladder findings on CT abdomen.  Cholecystectomy was recommended at that time as an option but patient elected for observation in the ED, patient was afebrile, hypertensive 201/83 and otherwise stable vitals.  Labs notable for elevated liver enzymes.  Right upper quadrant ultrasound showed an irregular masslike structure within the gallbladder, neoplastic versus tumefactive sludge, 7 mm gallstone was seen and common bile duct dilated to 6 mm.  ED physician spoke with general surgery on-call recommended admission for MRCP.  Patient better to hospitalist service with consult to general surgery and gastroenterology for further evaluation management.  Subjective 2/16: Patient seen this morning, wife at bedside.  No acute events reported overnight.  He is to go for ERCP today.  He asks about if and when he will have surgery to remove his gallbladder.  He denies any acute complaints including fevers, chills, abdominal pain, nausea vomiting or diarrhea, chest pain or shortness of breath.  Only complaint is hunger.   Assessment & Plan:   Principal Problem:   Abdominal pain Active Problems:   Seizure disorder (HCC)   Type 2 diabetes mellitus with hyperlipidemia (HCC)   Abnormal findings on diagnostic imaging of gall bladder   Obstructive jaundice   History of CVA (cerebrovascular accident) without residual deficits   Hypertensive urgency  Hypoglycemia associated with type 2 diabetes mellitus ([place])   Cholelithiasis without cholecystitis Choledocholithiasis Liver enzymes on admission: AST 340, ALT 30, alk phos 476 with total bilirubin 5.4. Lipase was 46.  LFT's improving. 2/14: RUQ ultrasound showing cholelithiasis without cholecystitis and irregular masslike structure within the gallbladder neoplastic versus tumefactive follow-up with 7 mm stone, CBD 6 mm. 2/15: MRCP confirmed stone in common bile duct. 2/16: ERCP with complete removal of the stone and 2 CBD stents placed. --To be determined if need for cholecystectomy following ERCP --Hold Lovenox VTE prophylaxis -GI and general surgery following -clear liquid diet -IV hydration -Pain control,IV antiemetics  Hypoglycemia Type II diabetes with hyperlipidemia -Hold home diabetes regimen -Hydration with D51/2NS -Blood sugar was 69. Patient reports not eating anything all day due to abdominal pain -Hypoglycemia protocol, sliding scale coverage  Hypertensive urgency -improved -Blood pressure was as high as 206/111 in the emergency room -Elevated blood pressure possibly related to pain -meds held while NPO, BP uncontrolled --resume home hydralazine 50 TID, amlodipine 10, lisinopril 20 (normal dose 40) --hold atenolol for now & monitor, add this and increase lisinopril as BP tolerates  History ofcerebrovascular accident without residual deficits -Continue aspirin and atorvastatin  Parkinson's disease -Continue Sinemet once reconciled  Seizure disorder -Continue Depakote   DVT prophylaxis: Lovenox   Code Status: Full Code  Family Communication: None at bedside during encounter  Disposition Plan: Expect discharge home to prior home environment, pending PT evaluation and clearance by consultants.  To be determined if any surgery will be done.  Possible referral/transfer to tertiary center due to concern for gallbladder neoplasm, in case of any liver  involvement. Coming From home Exp DC  Date: To be determined Barriers: Evaluation as above Medically Stable for Discharge?  No  Consultants:  Gastroenterology  General surgery  Procedures:   None  Antimicrobials:   None    Objective: Vitals:   06/11/19 2027 06/11/19 2157 06/11/19 2309 06/12/19 0415  BP: (!) 181/78 (!) 178/79 (!) 135/58 (!) 160/63  Pulse: 68 65 70 66  Resp:    18  Temp:    98.3 F (36.8 C)  TempSrc:    Oral  SpO2:    94%  Weight:      Height:        Intake/Output Summary (Last 24 hours) at 06/12/2019 0734 Last data filed at 06/12/2019 0509 Gross per 24 hour  Intake 2370.92 ml  Output 1350 ml  Net 1020.92 ml   Filed Weights   06/10/19 1513  Weight: 86.2 kg    Examination:  General exam: awake, alert, no acute distress HEENT: icteric sclera, moist mucus membranes, hearing grossly normal  Respiratory system: CTAB, no wheezes, rales or rhonchi, normal respiratory effort. Cardiovascular system: normal S1/S2, RRR, no JVD, murmurs, rubs, gallops, no pedal edema.   Gastrointestinal system: soft, NT, ND, no HSM felt, +bowel sounds. Central nervous system: A&O x4. no gross focal neurologic deficits, normal speech Extremities: moves all, no edema, normal tone Skin: dry, intact, normal temperature Psychiatry: normal mood, congruent affect, judgement and insight appear normal    Data Reviewed: I have personally reviewed following labs and imaging studies  CBC: Recent Labs  Lab 06/10/19 1517 06/11/19 0540 06/12/19 0527  WBC 7.9 4.7 4.6  HGB 13.8 12.0* 11.5*  HCT 43.8 37.9* 36.6*  MCV 88.1 87.7 87.4  PLT 161 128* 025*   Basic Metabolic Panel: Recent Labs  Lab 06/10/19 1517 06/11/19 0540 06/12/19 0527  NA 134* 134* 138  K 3.1* 4.0 3.6  CL 100 100 106  CO2 24 28 23   GLUCOSE 69* 166* 165*  BUN 17 15 12   CREATININE 1.20 1.26* 1.22  CALCIUM 8.5* 8.0* 8.0*  MG 2.0  --  1.6*   GFR: Estimated Creatinine Clearance: 49.9 mL/min (by  C-G formula based on SCr of 1.22 mg/dL). Liver Function Tests: Recent Labs  Lab 06/10/19 1517 06/11/19 0540 06/12/19 0527  AST 340* 251* 144*  ALT 30 110* 44  ALKPHOS 476* 418* 364*  BILITOT 5.4* 4.5* 2.0*  PROT 8.1 6.6 6.1*  ALBUMIN 3.8 3.0* 2.8*   Recent Labs  Lab 06/10/19 1517 06/11/19 0540  LIPASE 46 39   No results for input(s): AMMONIA in the last 168 hours. Coagulation Profile: Recent Labs  Lab 06/11/19 0540  INR 1.0   Cardiac Enzymes: No results for input(s): CKTOTAL, CKMB, CKMBINDEX, TROPONINI in the last 168 hours. BNP (last 3 results) No results for input(s): PROBNP in the last 8760 hours. HbA1C: Recent Labs    06/10/19 1517  HGBA1C 6.9*   CBG: Recent Labs  Lab 06/11/19 1139 06/11/19 1554 06/11/19 2049 06/12/19 0012 06/12/19 0411  GLUCAP 140* 151* 156* 149* 144*   Lipid Profile: No results for input(s): CHOL, HDL, LDLCALC, TRIG, CHOLHDL, LDLDIRECT in the last 72 hours. Thyroid Function Tests: No results for input(s): TSH, T4TOTAL, FREET4, T3FREE, THYROIDAB in the last 72 hours. Anemia Panel: No results for input(s): VITAMINB12, FOLATE, FERRITIN, TIBC, IRON, RETICCTPCT in the last 72 hours. Sepsis Labs: No results for input(s): PROCALCITON, LATICACIDVEN in the last 168 hours.  Recent Results (from the past 240 hour(s))  SARS CORONAVIRUS 2 (TAT 6-24 HRS) Nasopharyngeal Nasopharyngeal Swab  Status: None   Collection Time: 06/10/19  8:13 PM   Specimen: Nasopharyngeal Swab  Result Value Ref Range Status   SARS Coronavirus 2 NEGATIVE NEGATIVE Final    Comment: (NOTE) SARS-CoV-2 target nucleic acids are NOT DETECTED. The SARS-CoV-2 RNA is generally detectable in upper and lower respiratory specimens during the acute phase of infection. Negative results do not preclude SARS-CoV-2 infection, do not rule out co-infections with other pathogens, and should not be used as the sole basis for treatment or other patient management decisions. Negative  results must be combined with clinical observations, patient history, and epidemiological information. The expected result is Negative. Fact Sheet for Patients: SugarRoll.be Fact Sheet for Healthcare Providers: https://www.woods-mathews.com/ This test is not yet approved or cleared by the [place] FDA and  has been authorized for detection and/or diagnosis of SARS-CoV-2 by FDA under an Emergency Use Authorization (EUA). This EUA will remain  in effect (meaning this test can be used) for the duration of the COVID-19 declaration under Section 56 4(b)(1) of the Act, 21 U.S.C. section 360bbb-3(b)(1), unless the authorization is terminated or revoked sooner. Performed at [facility], [place] [address], [place], [place] [address]          Radiology Studies: MR 3D Recon At Scanner  Result Date: 06/11/2019 CLINICAL DATA:  Nonspecific (abnormal) findings on radiological and other examination of musculoskeletal system EXAM: 3-DIMENSIONAL MR IMAGE RENDERING ON ACQUISITION WORKSTATION TECHNIQUE: 3-dimensional MR images were rendered by post-processing of the original MR data on an acquisition workstation. The 3-dimensional MR images were interpreted and findings were reported in the accompanying complete MR report for this study COMPARISON:  None. FINDINGS: See report findings on MRI IMPRESSION: See report impression on accompanying MRI Electronically Signed   By: Suzy Bouchard M.D.   On: 06/11/2019 08:23   MR ABDOMEN MRCP W WO CONTAST  Result Date: 06/11/2019 CLINICAL DATA:  Periumbilical pain. Abdominal pain. Cholelithiasis. EXAM: MRI ABDOMEN WITHOUT AND WITH CONTRAST (INCLUDING MRCP) TECHNIQUE: Multiplanar multisequence MR imaging of the abdomen was performed both before and after the administration of intravenous contrast. Heavily T2-weighted images of the biliary and pancreatic ducts were obtained, and three-dimensional MRCP images were  rendered by post processing. CONTRAST:  7.56m GADAVIST GADOBUTROL 1 MMOL/ML IV SOLN COMPARISON:  Ultrasound 06/10/2019, HIDA scan 03/21/2019, CT 03/14/2019 FINDINGS: Lower chest:  Lung bases are clear. Hepatobiliary: Mild intrahepatic biliary duct dilatation. The extrahepatic ducts are mildly dilated. The common hepatic duct measures 8 mm. Common bile duct measures 8 mm. There are multiple round filling defects within the mid and distal common bile duct (image 18/11). Approximately 5 small filling defects measuring approximately 4-5 mm each consistent choledocholithiasis. These distal stones have high signal intensity on noncontrast T1 weighted imaging (image 46/19) typical of cholesterol stones. Within the neck of the gallbladder, there is similar cluster of material tissue which has high signal intensity on noncontrast T1 weighted imaging (image 39/9). There are gallstones collecting in the neck the gallbladder on comparison CT. Favor mixture of stones and sludge with some chronic inflammation. Chronic narrowing of the neck of the gallbladder associated the stones in probable sludge. Within the fundus of the gallbladder there is wall adherent lesion measuring approximately 10 mm (image 18/10). This lesion also has inherent T1 shortening similar to the stones and sludge in the neck of the gallbladder and common bile duct stones. Pancreas: Normal pancreatic parenchymal intensity. No ductal dilatation or inflammation. Spleen: Normal spleen. Adrenals/urinary tract: Adrenal glands and kidneys are  normal. Stomach/Bowel: Stomach and limited of the small bowel is unremarkable Vascular/Lymphatic: Abdominal aortic normal caliber. No retroperitoneal periportal lymphadenopathy. Musculoskeletal: No aggressive osseous lesion IMPRESSION: 1. Mild intra and extrahepatic biliary duct dilatation associated with partially obstructing stones in the common bile duct. 2. Collection of gallstones and sludge in the neck of the gallbladder  with chronic gallbladder neck narrowing. No gallbladder distension. 3. Lesion in the fundus of the gallbladder which corresponds to ultrasound abnormality. This lesion has similar imaging characteristics to the stones and sludge in the neck the gallbladder and favored wall adherent sludge and stones. Cannot completely exclude gallbladder neoplasm. No evidence of extension of the process beyond the gallbladder wall. Electronically Signed   By: Suzy Bouchard M.D.   On: 06/11/2019 08:16   US Abdomen Limited RUQ  Result Date: 06/10/2019 CLINICAL DATA:  Upper abdominal pain for 3 days. EXAM: ULTRASOUND ABDOMEN LIMITED RIGHT UPPER QUADRANT COMPARISON:  None. FINDINGS: Gallbladder: Irregular masslike structure within the gallbladder, neoplastic versus tumefactive sludge. 7 mm gallstone identified. No pericholecystic fluid. No sonographic Murphy's sign elicited, per the sonographer. Common bile duct: Diameter: 6 mm Liver: No focal lesion identified. Within normal limits in parenchymal echogenicity. Portal vein is patent on color Doppler imaging with normal direction of blood flow towards the liver. Other: None. IMPRESSION: 1. Irregular masslike structure within the gallbladder, neoplastic process versus tumefactive sludge. 2. Cholelithiasis without evidence of acute cholecystitis. 3. Liver appears normal. Electronically Signed   By: Franki Cabot M.D.   On: 06/10/2019 18:19        Scheduled Meds: . carbidopa-levodopa  1 tablet Oral TID  . divalproex  500 mg Oral BID  . insulin aspart  0-15 Units Subcutaneous Q4H   Continuous Infusions: . dextrose 5 % and 0.45 % NaCl with KCl 20 mEq/L 125 mL/hr at 06/12/19 0311  . magnesium sulfate bolus IVPB       LOS: 2 days    Time spent: 35 minutes    [name], DO [facility]   If 7PM-7AM, please contact night-coverage [email or web address] 06/12/2019, 7:34 AM

## 2019-06-13 ENCOUNTER — Inpatient Hospital Stay: Payer: PPO | Admitting: Anesthesiology

## 2019-06-13 ENCOUNTER — Encounter
Admission: EM | Disposition: A | Discharge status: Home / Self Care | Payer: Self-pay | Source: Home / Self Care | Attending: Internal Medicine

## 2019-06-13 ENCOUNTER — Encounter: Payer: Self-pay | Admitting: *Deleted

## 2019-06-13 DIAGNOSIS — E1169 Type 2 diabetes mellitus with other specified complication: Secondary | ICD-10-CM

## 2019-06-13 DIAGNOSIS — E785 Hyperlipidemia, unspecified: Secondary | ICD-10-CM

## 2019-06-13 HISTORY — PX: OTHER SURGICAL HISTORY: SHX169

## 2019-06-13 LAB — COMPREHENSIVE METABOLIC PANEL
ALT: 65 U/L — ABNORMAL HIGH (ref 0–44)
AST: 85 U/L — ABNORMAL HIGH (ref 15–41)
Albumin: 2.7 g/dL — ABNORMAL LOW (ref 3.5–5.0)
Alkaline Phosphatase: 294 U/L — ABNORMAL HIGH (ref 38–126)
Anion gap: 8 (ref 5–15)
BUN: 17 mg/dL (ref 8–23)
CO2: 23 mmol/L (ref 22–32)
Calcium: 8.2 mg/dL — ABNORMAL LOW (ref 8.9–10.3)
Chloride: 104 mmol/L (ref 98–111)
Creatinine, Ser: 1.49 mg/dL — ABNORMAL HIGH (ref 0.61–1.24)
GFR calc Af Amer: 51 mL/min — ABNORMAL LOW (ref >60)
GFR calc non Af Amer: 44 mL/min — ABNORMAL LOW (ref >60)
Glucose, Bld: 212 mg/dL — ABNORMAL HIGH (ref 70–99)
Potassium: 4 mmol/L (ref 3.5–5.1)
Sodium: 135 mmol/L (ref 135–145)
Total Bilirubin: 1.5 mg/dL — ABNORMAL HIGH (ref 0.3–1.2)
Total Protein: 6.2 g/dL — ABNORMAL LOW (ref 6.5–8.1)

## 2019-06-13 LAB — CBC
HCT: 35.2 % — ABNORMAL LOW (ref 39.0–52.0)
Hemoglobin: 11.2 g/dL — ABNORMAL LOW (ref 13.0–17.0)
MCH: 28.1 pg (ref 26.0–34.0)
MCHC: 31.8 g/dL (ref 30.0–36.0)
MCV: 88.2 fL (ref 80.0–100.0)
Platelets: 148 10*3/uL — ABNORMAL LOW (ref 150–400)
RBC: 3.99 MIL/uL — ABNORMAL LOW (ref 4.22–5.81)
RDW: 15.9 % — ABNORMAL HIGH (ref 11.5–15.5)
WBC: 6.9 10*3/uL (ref 4.0–10.5)
nRBC: 0 % (ref 0.0–0.2)

## 2019-06-13 LAB — GLUCOSE, CAPILLARY
Glucose-Capillary: 149 mg/dL — ABNORMAL HIGH (ref 70–99)
Glucose-Capillary: 214 mg/dL — ABNORMAL HIGH (ref 70–99)
Glucose-Capillary: 188 mg/dL — ABNORMAL HIGH (ref 70–99)
Glucose-Capillary: 167 mg/dL — ABNORMAL HIGH (ref 70–99)
Glucose-Capillary: 173 mg/dL — ABNORMAL HIGH (ref 70–99)
Glucose-Capillary: 245 mg/dL — ABNORMAL HIGH (ref 70–99)

## 2019-06-13 LAB — MAGNESIUM: Magnesium: 2 mg/dL (ref 1.7–2.4)

## 2019-06-13 SURGERY — CHOLECYSTECTOMY, ROBOT-ASSISTED, LAPAROSCOPIC
Anesthesia: General | Site: Abdomen

## 2019-06-13 MED ORDER — FENTANYL CITRATE (PF) 100 MCG/2ML IJ SOLN
INTRAMUSCULAR | Status: AC
  Filled 2019-06-13: qty 2

## 2019-06-13 MED ORDER — ONDANSETRON HCL 4 MG/2ML IJ SOLN
INTRAMUSCULAR | Status: AC
  Filled 2019-06-13: qty 2

## 2019-06-13 MED ORDER — DEXMEDETOMIDINE HCL IN NACL 80 MCG/20ML IV SOLN
INTRAVENOUS | Status: AC
  Filled 2019-06-13: qty 20

## 2019-06-13 MED ORDER — CEFAZOLIN SODIUM-DEXTROSE 2-3 GM-%(50ML) IV SOLR
INTRAVENOUS | Status: DC | PRN
  Administered 2019-06-13: 2 g via INTRAVENOUS

## 2019-06-13 MED ORDER — INDOCYANINE GREEN 25 MG IV SOLR
1.2500 mg | Freq: Once | INTRAVENOUS | Status: AC
  Administered 2019-06-13: 15:00:00 1.25 mg via INTRAVENOUS
  Filled 2019-06-13: qty 10

## 2019-06-13 MED ORDER — FENTANYL CITRATE (PF) 100 MCG/2ML IJ SOLN
INTRAMUSCULAR | Status: AC
  Administered 2019-06-13: 20:00:00 50 ug via INTRAVENOUS
  Filled 2019-06-13: qty 2

## 2019-06-13 MED ORDER — OXYCODONE HCL 5 MG/5ML PO SOLN: 5.0000 mg | Freq: Once | ORAL | Status: DC | PRN

## 2019-06-13 MED ORDER — ROCURONIUM BROMIDE 50 MG/5ML IV SOLN
INTRAVENOUS | Status: AC
  Filled 2019-06-13: qty 1

## 2019-06-13 MED ORDER — LIDOCAINE-EPINEPHRINE 1 %-1:100000 IJ SOLN
INTRAMUSCULAR | Status: AC
  Filled 2019-06-13: qty 1

## 2019-06-13 MED ORDER — DEXAMETHASONE SODIUM PHOSPHATE 10 MG/ML IJ SOLN
INTRAMUSCULAR | Status: AC
  Filled 2019-06-13: qty 1

## 2019-06-13 MED ORDER — CEFAZOLIN SODIUM 1 G IJ SOLR
INTRAMUSCULAR | Status: AC
  Filled 2019-06-13: qty 20

## 2019-06-13 MED ORDER — BUPIVACAINE HCL (PF) 0.5 % IJ SOLN
INTRAMUSCULAR | Status: AC
  Filled 2019-06-13: qty 30

## 2019-06-13 MED ORDER — BUPIVACAINE HCL 0.5 % IJ SOLN
INTRAMUSCULAR | Status: DC | PRN
  Administered 2019-06-13: 6 mL

## 2019-06-13 MED ORDER — FENTANYL CITRATE (PF) 100 MCG/2ML IJ SOLN
25.0000 ug | INTRAMUSCULAR | Status: DC | PRN
  Administered 2019-06-13: 20:00:00 50 ug via INTRAVENOUS

## 2019-06-13 MED ORDER — BISACODYL 5 MG PO TBEC: 5.0000 mg | DELAYED_RELEASE_TABLET | Freq: Every day | ORAL | Status: DC | PRN

## 2019-06-13 MED ORDER — DEXAMETHASONE SODIUM PHOSPHATE 10 MG/ML IJ SOLN
INTRAMUSCULAR | Status: DC | PRN
  Administered 2019-06-13: 5 mg via INTRAVENOUS

## 2019-06-13 MED ORDER — ROCURONIUM BROMIDE 100 MG/10ML IV SOLN
INTRAVENOUS | Status: DC | PRN
  Administered 2019-06-13: 20 mg via INTRAVENOUS
  Administered 2019-06-13: 50 mg via INTRAVENOUS

## 2019-06-13 MED ORDER — INSULIN ASPART 100 UNIT/ML ~~LOC~~ SOLN
SUBCUTANEOUS | Status: AC
  Filled 2019-06-13: qty 1

## 2019-06-13 MED ORDER — SODIUM CHLORIDE 0.9 % IV SOLN: INTRAVENOUS | Status: DC | PRN

## 2019-06-13 MED ORDER — DEXMEDETOMIDINE HCL IN NACL 400 MCG/100ML IV SOLN
INTRAVENOUS | Status: DC | PRN
  Administered 2019-06-13 (×2): 4 ug via INTRAVENOUS

## 2019-06-13 MED ORDER — SODIUM CHLORIDE 0.9 % IV SOLN: INTRAVENOUS | Status: DC

## 2019-06-13 MED ORDER — OXYCODONE HCL 5 MG PO TABS: 5.0000 mg | ORAL_TABLET | Freq: Once | ORAL | Status: DC | PRN

## 2019-06-13 MED ORDER — LIDOCAINE HCL (PF) 2 % IJ SOLN
INTRAMUSCULAR | Status: AC
  Filled 2019-06-13: qty 10

## 2019-06-13 MED ORDER — MAGNESIUM CITRATE PO SOLN
1.0000 | Freq: Once | ORAL | Status: DC
  Filled 2019-06-13: qty 296

## 2019-06-13 MED ORDER — PROPOFOL 10 MG/ML IV BOLUS
INTRAVENOUS | Status: AC
  Filled 2019-06-13: qty 20

## 2019-06-13 MED ORDER — GLYCOPYRROLATE 0.2 MG/ML IJ SOLN
INTRAMUSCULAR | Status: DC | PRN
  Administered 2019-06-13: .1 mg via INTRAVENOUS

## 2019-06-13 MED ORDER — PROPOFOL 10 MG/ML IV BOLUS
INTRAVENOUS | Status: DC | PRN
  Administered 2019-06-13: 150 mg via INTRAVENOUS

## 2019-06-13 MED ORDER — POLYETHYLENE GLYCOL 3350 17 G PO PACK
17.0000 g | PACK | Freq: Every day | ORAL | Status: DC
  Administered 2019-06-14 – 2019-06-15 (×2): 17 g via ORAL
  Filled 2019-06-13 (×2): qty 1

## 2019-06-13 MED ORDER — SUGAMMADEX SODIUM 200 MG/2ML IV SOLN
INTRAVENOUS | Status: DC | PRN
  Administered 2019-06-13: 180 mg via INTRAVENOUS

## 2019-06-13 MED ORDER — SUGAMMADEX SODIUM 200 MG/2ML IV SOLN
INTRAVENOUS | Status: AC
  Filled 2019-06-13: qty 2

## 2019-06-13 MED ORDER — HYDRALAZINE HCL 20 MG/ML IJ SOLN
INTRAMUSCULAR | Status: AC
  Filled 2019-06-13: qty 1

## 2019-06-13 MED ORDER — LIDOCAINE HCL (CARDIAC) PF 100 MG/5ML IV SOSY
PREFILLED_SYRINGE | INTRAVENOUS | Status: DC | PRN
  Administered 2019-06-13: 60 mg via INTRAVENOUS

## 2019-06-13 MED ORDER — CHLORHEXIDINE GLUCONATE CLOTH 2 % EX PADS
6.0000 | MEDICATED_PAD | Freq: Every day | CUTANEOUS | Status: DC
  Administered 2019-06-13: 08:00:00 6 via TOPICAL

## 2019-06-13 MED ORDER — ONDANSETRON HCL 4 MG/2ML IJ SOLN
INTRAMUSCULAR | Status: DC | PRN
  Administered 2019-06-13: 4 mg via INTRAVENOUS

## 2019-06-13 MED ORDER — FENTANYL CITRATE (PF) 100 MCG/2ML IJ SOLN
INTRAMUSCULAR | Status: DC | PRN
  Administered 2019-06-13 (×5): 50 ug via INTRAVENOUS

## 2019-06-13 MED ORDER — LIDOCAINE-EPINEPHRINE 1 %-1:100000 IJ SOLN
INTRAMUSCULAR | Status: DC | PRN
  Administered 2019-06-13: 6 mL

## 2019-06-13 SURGICAL SUPPLY — 61 items
ANCHOR TIS RET SYS 235ML (MISCELLANEOUS) ×4 IMPLANT
BAG INFUSER PRESSURE 100CC (MISCELLANEOUS) ×4 IMPLANT
BLADE SURG SZ11 CARB STEEL (BLADE) ×4 IMPLANT
BULB RESERV EVAC DRAIN JP 100C (MISCELLANEOUS) ×4 IMPLANT
CANISTER SUCT 1200ML W/VALVE (MISCELLANEOUS) ×4 IMPLANT
CANNULA REDUC XI 12-8 STAPL (CANNULA) ×1
CANNULA REDUC XI 12-8MM STAPL (CANNULA) ×1
CANNULA REDUCER 12-8 DVNC XI (CANNULA) ×2 IMPLANT
CHLORAPREP W/TINT 26 (MISCELLANEOUS) ×4 IMPLANT
CLIP VESOLOCK MED LG 6/CT (CLIP) ×4 IMPLANT
COVER TIP SHEARS 8 DVNC (MISCELLANEOUS) ×2 IMPLANT
COVER TIP SHEARS 8MM DA VINCI (MISCELLANEOUS) ×2
COVER WAND RF STERILE (DRAPES) ×4 IMPLANT
DECANTER SPIKE VIAL GLASS SM (MISCELLANEOUS) ×8 IMPLANT
DEFOGGER SCOPE WARMER CLEARIFY (MISCELLANEOUS) ×4 IMPLANT
DERMABOND ADVANCED (GAUZE/BANDAGES/DRESSINGS) ×2
DERMABOND ADVANCED .7 DNX12 (GAUZE/BANDAGES/DRESSINGS) ×2 IMPLANT
DRAIN CHANNEL JP 15F RND 16 (MISCELLANEOUS) ×4 IMPLANT
DRAPE ARM DVNC X/XI (DISPOSABLE) ×8 IMPLANT
DRAPE COLUMN DVNC XI (DISPOSABLE) ×2 IMPLANT
DRAPE DA VINCI XI ARM (DISPOSABLE) ×8
DRAPE DA VINCI XI COLUMN (DISPOSABLE) ×2
ELECT CAUTERY BLADE 6.4 (BLADE) ×4 IMPLANT
ELECT REM PT RETURN 9FT ADLT (ELECTROSURGICAL) ×4
ELECTRODE REM PT RTRN 9FT ADLT (ELECTROSURGICAL) ×2 IMPLANT
GLOVE BIOGEL PI IND STRL 7.0 (GLOVE) ×4 IMPLANT
GLOVE BIOGEL PI INDICATOR 7.0 (GLOVE) ×4
GLOVE SURG SYN 6.5 ES PF (GLOVE) ×8 IMPLANT
GOWN STRL REUS W/ TWL LRG LVL3 (GOWN DISPOSABLE) ×6 IMPLANT
GOWN STRL REUS W/TWL LRG LVL3 (GOWN DISPOSABLE) ×6
GRASPER SUT TROCAR 14GX15 (MISCELLANEOUS) IMPLANT
IRRIGATOR SUCT 8 DISP DVNC XI (IRRIGATION / IRRIGATOR) ×2 IMPLANT
IRRIGATOR SUCTION 8MM XI DISP (IRRIGATION / IRRIGATOR) ×2
IV NS 1000ML (IV SOLUTION) ×2
IV NS 1000ML BAXH (IV SOLUTION) ×2 IMPLANT
LABEL OR SOLS (LABEL) ×4 IMPLANT
NEEDLE HYPO 22GX1.5 SAFETY (NEEDLE) ×4 IMPLANT
NEEDLE INSUFFLATION 14GA 120MM (NEEDLE) ×4 IMPLANT
NS IRRIG 500ML POUR BTL (IV SOLUTION) ×4 IMPLANT
OBTURATOR OPTICAL STANDARD 8MM (TROCAR) ×2
OBTURATOR OPTICAL STND 8 DVNC (TROCAR) ×2
OBTURATOR OPTICALSTD 8 DVNC (TROCAR) ×2 IMPLANT
PACK LAP CHOLECYSTECTOMY (MISCELLANEOUS) ×4 IMPLANT
PENCIL ELECTRO HAND CTR (MISCELLANEOUS) ×4 IMPLANT
SEAL CANN UNIV 5-8 DVNC XI (MISCELLANEOUS) ×6 IMPLANT
SEAL XI 5MM-8MM UNIVERSAL (MISCELLANEOUS) ×6
SET TUBE SMOKE EVAC HIGH FLOW (TUBING) ×4 IMPLANT
SOLUTION ELECTROLUBE (MISCELLANEOUS) ×4 IMPLANT
SPONGE DRAIN TRACH 4X4 STRL 2S (GAUZE/BANDAGES/DRESSINGS) ×4 IMPLANT
STAPLER CANNULA SEAL DVNC XI (STAPLE) ×2 IMPLANT
STAPLER CANNULA SEAL XI (STAPLE) ×2
SUT ETHILON 3-0 FS-10 30 BLK (SUTURE) ×4
SUT MNCRL 4-0 (SUTURE) ×4
SUT MNCRL 4-0 27XMFL (SUTURE) ×4
SUT VIC AB 0 CT1 36 (SUTURE) ×4 IMPLANT
SUT VIC AB 3-0 SH 27 (SUTURE) ×2
SUT VIC AB 3-0 SH 27X BRD (SUTURE) ×2 IMPLANT
SUT VICRYL 0 AB UR-6 (SUTURE) ×4 IMPLANT
SUTURE EHLN 3-0 FS-10 30 BLK (SUTURE) ×2 IMPLANT
SUTURE MNCRL 4-0 27XMF (SUTURE) ×4 IMPLANT
SYSTEM WECK SHIELD CLOSURE (TROCAR) ×4 IMPLANT

## 2019-06-13 NOTE — Anesthesia Preprocedure Evaluation (Signed)
Anesthesia Evaluation  Patient identified by MRN, date of birth, ID band Patient awake    Reviewed: Allergy & Precautions, H&P , NPO status , Patient's Chart, lab work & pertinent test results  History of Anesthesia Complications Negative for: history of anesthetic complications  Airway Mallampati: III  TM Distance: <3 FB Neck ROM: limited    Dental  (+) Chipped, Poor Dentition, Missing   Pulmonary neg pulmonary ROS, neg shortness of breath,           Cardiovascular hypertension, (-) angina(-) Past MI      Neuro/Psych Seizures -,  CVA, Residual Symptoms negative psych ROS   GI/Hepatic negative GI ROS, Neg liver ROS,   Endo/Other  diabetes, Type 2  Renal/GU      Musculoskeletal   Abdominal   Peds  Hematology negative hematology ROS (+)   Anesthesia Other Findings Past Medical History: No date: Diabetes mellitus without complication (HCC) No date: Hypertension No date: Parkinson disease ([place]) No date: Stroke [facility])  Past Surgical History: No date: ELBOW SURGERY 06/12/2019: ENDOSCOPIC RETROGRADE CHOLANGIOPANCREATOGRAPHY (ERCP) WITH  PROPOFOL; N/A     Comment:  Procedure: ENDOSCOPIC RETROGRADE               CHOLANGIOPANCREATOGRAPHY (ERCP) WITH PROPOFOL;  Surgeon:               Lucilla Lame, MD;  Location: [facility] ENDOSCOPY;  Service:               Endoscopy;  Laterality: N/A; No date: NO PAST SURGERIES  BMI    Body Mass Index: 27.27 kg/m      Reproductive/Obstetrics negative OB ROS                             Anesthesia Physical Anesthesia Plan  ASA: III  Anesthesia Plan: General ETT   Post-op Pain Management:    Induction: Intravenous  PONV Risk Score and Plan: Ondansetron, Dexamethasone, Midazolam and Treatment may vary due to age or medical condition  Airway Management Planned: Oral ETT and Video Laryngoscope Planned  Additional Equipment:   Intra-op Plan:    Post-operative Plan: Extubation in OR  Informed Consent: I have reviewed the patients History and Physical, chart, labs and discussed the procedure including the risks, benefits and alternatives for the proposed anesthesia with the patient or authorized representative who has indicated his/her understanding and acceptance.     Dental Advisory Given  Plan Discussed with: Anesthesiologist, CRNA and Surgeon  Anesthesia Plan Comments: (Patient consented for risks of anesthesia including but not limited to:  - adverse reactions to medications - damage to teeth, lips or other oral mucosa - sore throat or hoarseness - Damage to heart, brain, lungs or loss of life  Patient voiced understanding.)        Anesthesia Quick Evaluation

## 2019-06-13 NOTE — Care Management Important Message (Signed)
Important Message  Patient Details  Name: Gilbert EVILSIZER Sr. MRN: [id number] Date of Birth: [birth year]   Medicare Important Message Given:  Yes     [name] 06/13/2019, 10:38 AM

## 2019-06-13 NOTE — Anesthesia Postprocedure Evaluation (Signed)
Anesthesia Post Note  Patient: Chrystie Nose Cotterill Sr.  Procedure(s) Performed: ENDOSCOPIC RETROGRADE CHOLANGIOPANCREATOGRAPHY (ERCP) WITH PROPOFOL (N/A )  Patient location during evaluation: PACU Anesthesia Type: General Level of consciousness: awake and alert Pain management: pain level controlled Vital Signs Assessment: post-procedure vital signs reviewed and stable Respiratory status: spontaneous breathing, respiratory function stable and nonlabored ventilation Cardiovascular status: blood pressure returned to baseline and stable Postop Assessment: no apparent nausea or vomiting Anesthetic complications: no     Last Vitals:  Vitals:   06/13/19 0427 06/13/19 0835  BP: (!) 119/49 (!) 139/59  Pulse: 63   Resp: 18   Temp: 36.7 C   SpO2: 96%     Last Pain:  Vitals:   06/13/19 0738  TempSrc:   PainSc: 0-No pain                 Tera Mater

## 2019-06-13 NOTE — Transfer of Care (Signed)
Immediate Anesthesia Transfer of Care Note  Patient: Chrystie Nose Pyon Sr.  Procedure(s) Performed: XI ROBOTIC ASSISTED LAPAROSCOPIC CHOLECYSTECTOMY (N/A Abdomen) INDOCYANINE GREEN FLUORESCENCE IMAGING (ICG) (N/A )  Patient Location: PACU  Anesthesia Type:General  Level of Consciousness: sedated and patient cooperative  Airway & Oxygen Therapy: Patient Spontanous Breathing and Patient connected to face mask oxygen  Post-op Assessment: Report given to RN and Post -op Vital signs reviewed and stable  Post vital signs: Reviewed and stable  Last Vitals:  Vitals Value Taken Time  BP    Temp    Pulse    Resp    SpO2      Last Pain:  Vitals:   06/13/19 1459  TempSrc: Tympanic  PainSc: 0-No pain      Patients Stated Pain Goal: 0 ([id number])  Complications: No apparent anesthesia complications

## 2019-06-13 NOTE — Anesthesia Postprocedure Evaluation (Signed)
Anesthesia Post Note  Patient: Gilbert Nose Ibarra Sr.  Procedure(s) Performed: XI ROBOTIC ASSISTED LAPAROSCOPIC CHOLECYSTECTOMY (N/A Abdomen) INDOCYANINE GREEN FLUORESCENCE IMAGING (ICG) (N/A )  Patient location during evaluation: PACU Anesthesia Type: General Level of consciousness: awake and alert Pain management: pain level controlled Vital Signs Assessment: post-procedure vital signs reviewed and stable Respiratory status: spontaneous breathing, nonlabored ventilation, respiratory function stable and patient connected to nasal cannula oxygen Cardiovascular status: blood pressure returned to baseline and stable Postop Assessment: no apparent nausea or vomiting Anesthetic complications: no     Last Vitals:  Vitals:   06/13/19 2008 06/13/19 2032  BP: (!) 152/69 (!) 161/75  Pulse: 88 89  Resp: (!) 26 18  Temp: 37 C 36.8 C  SpO2: 93% 93%    Last Pain:  Vitals:   06/13/19 2032  TempSrc: Oral  PainSc:                  Precious Haws Sura Canul

## 2019-06-13 NOTE — TOC Initial Note (Signed)
Transition of Care (TOC) - Initial/Assessment Note    Patient Details  Name: Gilbert KOLODZIEJ Sr. MRN: [id number] Date of Birth: [birth year]  Transition of Care [facility] CM/SW Contact:    Beverly Sessions, RN Phone Number: 06/13/2019, 1:41 PM  Clinical Narrative:                 Patient admitted from home with abdominal pain Patient lives at home with wife  Plan for lap chole today  PCP [facility] - denies issues obtaining medications  Patient states "I can still drive, but my wife doesn't let me, so she drives"  PT has assessed patient and recommends home health.  Patient states he used someone last year, and would like to use them again.  Per chart review [facility] was used.  Patient confirms.  [facility] [place]  Patient has a RW and BSC in the home  Expected Discharge Plan: [place] Barriers to Discharge: Continued Medical Work up   Patient Goals and CMS Choice     Choice offered to / list presented to : Patient  Expected Discharge Plan and Services Expected Discharge Plan: [place]   Discharge Planning Services: CM Consult Post Acute Care Choice: [place] arrangements for the past 2 months: [place]: PT [place] Agency: [place] ([place]) Date HH Agency Contacted: 06/13/19   Representative spoke with at [place]: Corene Cornea  Prior Living Arrangements/Services Living arrangements for the past 2 months: [place] Lives with:: Spouse Patient language and need for interpreter reviewed:: Yes Do you feel safe going back to the place where you live?: Yes      Need for Family Participation in Patient Care: Yes (Comment) Care giver support system in place?: Yes (comment) Current home services: DME Criminal Activity/Legal Involvement Pertinent to Current Situation/Hospitalization: No - Comment  as needed  Activities of Daily Living Home Assistive Devices/Equipment: None ADL Screening (condition at time of admission) Patient's cognitive ability adequate to safely complete daily activities?: Yes Is the patient deaf or have difficulty hearing?: Yes Does the patient have difficulty seeing, even when wearing glasses/contacts?: No Does the patient have difficulty concentrating, remembering, or making decisions?: No Patient able to express need for assistance with ADLs?: Yes Does the patient have difficulty dressing or bathing?: No Independently performs ADLs?: Yes (appropriate for developmental age) Does the patient have difficulty walking or climbing stairs?: Yes Weakness of Legs: Both Weakness of Arms/Hands: Both  Permission Sought/Granted                  Emotional Assessment       Orientation: : Oriented to Self, Oriented to Place, Oriented to  Time, Oriented to Situation   Psych Involvement: No (comment)  Admission diagnosis:  Jaundice [R17] Upper abdominal pain [R10.10] Abdominal pain [R10.9] Patient Active Problem List   Diagnosis Date Noted  . Type 2 diabetes mellitus with hyperlipidemia ([place]) 06/10/2019  . Abnormal findings on diagnostic imaging of gall bladder 06/10/2019  . Obstructive jaundice 06/10/2019  . Abdominal pain 06/10/2019  . History of CVA (cerebrovascular accident) without residual deficits 06/10/2019  . Hypertensive urgency 06/10/2019  . Hypoglycemia associated with type 2 diabetes mellitus ([place]) 06/10/2019  . Dehydration 12/10/2018  .  Seizure disorder ([place])   . Expressive aphasia   . CVA (cerebral vascular accident) ([place]) 05/05/2016   PCP:  [name], MD Pharmacy:   [facility], [place] [address] Phone: [phone] Fax: [place], [place] [address] [place] [address] Phone:  [phone] Fax: [place], [place] [address] Phone: [phone] Fax: [phone]     Social Determinants of Health (SDOH) Interventions    Readmission Risk Interventions No flowsheet data found.

## 2019-06-13 NOTE — Evaluation (Signed)
Physical Therapy Evaluation Patient Details Name: Gilbert GIRTEN Sr. MRN: [id number] DOB: [birth year] Today's Date: 06/13/2019   History of Present Illness  Per MD notes: Pt is a 79 y.o. male with medical history significant for diabetes, hypertension, history of CVA and seizure disorder, who presented to the emergency room with a history of periumbilical mid abdominal pain and is s/p ERCP. Pt is scheduled for robo assisted lap chole in the PM on 06/13/2019    Clinical Impression  Pt pleasant and motivated to participate during the session. Pt was found on RA and SpO2 and HR were WNL t/o session. Pt completed bed mobility, transfers, and ambulation without physical assistance, but required CGA during ambulation due to being slightly unsteady on his feet when ambulating. Pt reported 3 falls in the past 6 months due to LOB and stated that he was not using an AD during any of the falls. During amb today, pt verbalized his preference to not use an AD when at home. Pt was educated on the benefit and increased safety of using a RW. Pt was able to ambulate around the nursing station twice without any noted difficulty. Pt's largest limitations were related to his balance when completing balance exercises. Pt will benefit from HHPT services upon discharge to safely address deficits listed in patient problem list for decreased caregiver assistance and eventual return to PLOF.       Follow Up Recommendations Home health PT    Equipment Recommendations  (Pt has all neccessary equipment but prefers to not use RW and BSC)    Recommendations for Other Services       Precautions / Restrictions Precautions Precautions: Fall Restrictions Weight Bearing Restrictions: No      Mobility  Bed Mobility Overal bed mobility: Modified Independent             General bed mobility comments: Takes extra time  Transfers Overall transfer level: Needs assistance Equipment used: Rolling walker (2  wheeled) Transfers: Sit to/from Stand Sit to Stand: Supervision         General transfer comment: provided with min verbal cueing for hand placement  Ambulation/Gait Ambulation/Gait assistance: Min guard Gait Distance (Feet): 350 Feet Assistive device: Rolling walker (2 wheeled) Gait Pattern/deviations: Shuffle Gait velocity: WNL   General Gait Details: Pt takes small and quick steps during amb that could be described as a minimal shuffle. Pt required verbal cueing for RW use but had good carryover after inital cueing  Stairs            Wheelchair Mobility    Modified Rankin (Stroke Patients Only)       Balance Overall balance assessment: Needs assistance Sitting-balance support: No upper extremity supported;Feet supported Sitting balance-Leahy Scale: Good     Standing balance support: No upper extremity supported;During functional activity Standing balance-Leahy Scale: Fair Standing balance comment: Pt was slightly unstead at times with amb but never required more than CGA with amb with a RW Single Leg Stance - Right Leg: 1(pt able to tolerate 1 second of R SLS) Single Leg Stance - Left Leg: 1(pt able to tolerate 1 second of L SLS) Semi-Tandem Stance - Right Leg: 30(Pt able to complete a UE task in semi-tandem stance with 2 occasions of min assist over a 30 second period)                       Pertinent Vitals/Pain Pain Assessment: No/denies pain    Home Living Family/patient expects to  be discharged to:: Private residence Living Arrangements: Spouse/significant other Available Help at Discharge: Family Type of Home: House Home Access: Stairs to enter Entrance Stairs-Rails: Right;Left(bil railings but cannot reach both at once) Entrance Stairs-Number of Steps: [place]: Two level;Able to live on main level with bedroom/bathroom Home Equipment: Cane - single point;Grab bars - tub/shower;Walker - 2 wheels;Bedside commode Additional Comments: has  3 suction grab bars in shower but states that they would probably fall off if he fell when holding on to them    Prior Function Level of Independence: Independent         Comments: community amb, no AD, ind with ADL's. 3 falls in past 6 months secondary to loss of balance without an AD outside of his home     Hand Dominance        Extremity/Trunk Assessment        Lower Extremity Assessment Lower Extremity Assessment: Generalized weakness       Communication   Communication: No difficulties  Cognition Arousal/Alertness: Awake/alert Behavior During Therapy: WFL for tasks assessed/performed Overall Cognitive Status: Within Functional Limits for tasks assessed                                        General Comments      Exercises Total Joint Exercises Ankle Circles/Pumps: AROM;Strengthening;10 reps;Seated Gluteal Sets: Strengthening;Both;10 reps;Seated Long Arc Quad: AROM;Strengthening;Both;15 Theatre manager in Standing: AROM;Strengthening;Both;10 reps Other Exercises Other Exercises: Semi-tandem stance with UE task for balance   Assessment/Plan    PT Assessment Patient needs continued PT services  PT Problem List Decreased strength;Decreased balance;Decreased knowledge of use of DME       PT Treatment Interventions DME instruction;Therapeutic exercise;Gait training;Balance training;Stair training;Functional mobility training;Therapeutic activities    PT Goals (Current goals can be found in the Care Plan section)  Acute Rehab PT Goals Patient Stated Goal: improve balance, keep strength, and walk PT Goal Formulation: With patient Time For Goal Achievement: 06/26/19 Potential to Achieve Goals: Good Additional Goals Additional Goal #1: Pt will maintain tandem stance position for >/ 30 sec without any LOB    Frequency Min 2X/week   Barriers to discharge        Co-evaluation               AM-PAC PT "6 Clicks" Mobility  Outcome  Measure Help needed turning from your back to your side while in a flat bed without using bedrails?: A Little Help needed moving from lying on your back to sitting on the side of a flat bed without using bedrails?: A Little Help needed moving to and from a bed to a chair (including a wheelchair)?: A Little Help needed standing up from a chair using your arms (e.g., wheelchair or bedside chair)?: A Little Help needed to walk in hospital room?: A Little Help needed climbing 3-5 steps with a railing? : A Little 6 Click Score: 18    End of Session Equipment Utilized During Treatment: Gait belt Activity Tolerance: Patient tolerated treatment well Patient left: in chair;with call bell/phone within reach;with chair alarm set;with SCD's reapplied Nurse Communication: Mobility status PT Visit Diagnosis: Unsteadiness on feet (R26.81)    Time: [id number] PT Time Calculation (min) (ACUTE ONLY): 39 min   Charges:              Annabelle Harman, SPT 06/13/19 1:38 PM

## 2019-06-13 NOTE — Progress Notes (Signed)
PROGRESS NOTE    Gilbert ODOHERTY Sr.  YOV:[id number] DOB: [birth year] DOA: 06/10/2019  PCP: [name], MD    LOS - 3   Brief Narrative:  Gilbert Greeneis a 79 y.o.malewith medical history significant fordiabetes, hypertension, history of CVA and seizure disorder, who presented to theED on 2/14with3-day history of periumbilicalmid abdominal pain crampy and of moderate to severe intensitynon-radiating worseafter eating. Patient had recently been evaluated by surgery in December 2020 for left lower quadrant pain and abnormal gallbladder findings on CT abdomen. Cholecystectomy was recommended at that time as an option but patient elected for observation in the ED, patient was afebrile, hypertensive 201/83 and otherwise stable vitals. Labs notable for elevated liver enzymes. Right upper quadrant ultrasound showed an irregular masslike structure within the gallbladder, neoplastic versus tumefactive sludge, 7 mm gallstone was seen and common bile duct dilated to 6 mm. ED physician spoke with general surgery on-call recommended admission for MRCP. Patient better to hospitalist service with consult to general surgery and gastroenterology for further evaluation management.  Subjective 2/17: Patient seen and examined at bedside this morning.  No family present.  He reports Good.  States he is feeling good ever since his abdominal pain resolved after admission.  Does state he has not had a BM since admission.  Bowel regimen ordered.  He reports going for cholecystectomy later today  Assessment & Plan:   Principal Problem:   Abdominal pain Active Problems:   Seizure disorder (HCC)   Type 2 diabetes mellitus with hyperlipidemia (HCC)   Abnormal findings on diagnostic imaging of gall bladder   Obstructive jaundice   History of CVA (cerebrovascular accident) without residual deficits   Hypertensive urgency   Hypoglycemia associated with type 2 diabetes mellitus ([place])    Cholelithiasis without cholecystitis Choledocholithiasis Liver enzymes on admission:AST 340, ALT 30, alk phos 476 with total bilirubin 5.4. Lipase was 46.  LFT's improving. 2/14: RUQ ultrasound showing cholelithiasis without cholecystitis and irregular masslike structure within the gallbladder neoplastic versus tumefactive follow-up with 7 mm stone, CBD 6 mm.  MRCP confirmed stone in common bile duct. 2/16: ERCP with complete removal of the stone and 2 CBD stents placed. 2/17: Plan is for cholecystectomy this afternoon --Hold Lovenox VTE prophylaxis -GI and general surgery following -Diet per surgery, has been on clear liquids since admission -IV hydration -Pain control,IV antiemetics  Hypoglycemia -resolved, blood glucose 69 on admission Type II diabetes with hyperlipidemia -Hold home diabetes regimen -Hypoglycemia protocol --Sliding scale coverage  Hypertensive urgency-resolved -Blood pressure was as high as 206/111 in the emergency room -Elevated blood pressure possibly related to pain -meds held while NPO, BP uncontrolled --resumed home hydralazine 50 TID, amlodipine 10, lisinopril 20 (normal dose 40) --hold atenolol for now & monitor, add this and increase lisinopril as BP tolerates  History ofcerebrovascular accident without residual deficits -Continue aspirin and atorvastatin  Parkinson's disease -Continue Sinemet once reconciled  Seizure disorder -Continue Depakote   DVT prophylaxis:Lovenox Code Status: Full Code Family Communication:None at bedside during encounter  Disposition Plan:Expect discharge home to prior home environment, pending PT evaluation and clearance by consultants.  Cholecystectomy today.  Possible referral/transfer to tertiary center due to concern for gallbladder neoplasm, in case of any liver involvement, pending findings during surgery today. Coming Fromhome Exp DC Date:To be determined Barriers:Evaluation as above  Medically Stable for Discharge?No  Consultants: Gastroenterology  General surgery  Procedures:  None  Antimicrobials:  None    Objective: Vitals:   06/12/19 1544 06/12/19 1753  06/12/19 2002 06/13/19 0427  BP: (!) 169/105 (!) 175/82 (!) 152/63 (!) 119/49  Pulse: 74  79 63  Resp: 18  16 18   Temp: (!) 97.5 F (36.4 C)  97.9 F (36.6 C) 98 F (36.7 C)  TempSrc: Oral  Oral Oral  SpO2: 95%  92% 96%  Weight:      Height:        Intake/Output Summary (Last 24 hours) at 06/13/2019 0731 Last data filed at 06/13/2019 0502 Gross per 24 hour  Intake 2468.37 ml  Output 1267 ml  Net 1201.37 ml   Filed Weights   06/10/19 1513 06/12/19 1145  Weight: 86.2 kg 86.2 kg    Examination:  General exam: awake, alert, no acute distress HEENT: moist mucus membranes, hearing grossly normal  Respiratory system: CTAB, no wheezes, rales or rhonchi, normal respiratory effort. Cardiovascular system: normal S1/S2, RRR, no JVD, murmurs, rubs, gallops, no pedal edema.   Gastrointestinal system: soft, NT, ND, no HSM felt, +bowel sounds. Central nervous system: A&O x4. no gross focal neurologic deficits, normal speech Extremities: moves all, no edema, normal tone Psychiatry: normal mood, congruent affect, judgement and insight appear normal    Data Reviewed: I have personally reviewed following labs and imaging studies  CBC: Recent Labs  Lab 06/10/19 1517 06/11/19 0540 06/12/19 0527  WBC 7.9 4.7 4.6  HGB 13.8 12.0* 11.5*  HCT 43.8 37.9* 36.6*  MCV 88.1 87.7 87.4  PLT 161 128* 694*   Basic Metabolic Panel: Recent Labs  Lab 06/10/19 1517 06/11/19 0540 06/12/19 0527  NA 134* 134* 138  K 3.1* 4.0 3.6  CL 100 100 106  CO2 24 28 23   GLUCOSE 69* 166* 165*  BUN 17 15 12   CREATININE 1.20 1.26* 1.22  CALCIUM 8.5* 8.0* 8.0*  MG 2.0  --  1.6*   GFR: Estimated Creatinine Clearance: 49.9 mL/min (by C-G formula based on SCr of 1.22 mg/dL). Liver Function Tests: Recent  Labs  Lab 06/10/19 1517 06/11/19 0540 06/12/19 0527  AST 340* 251* 144*  ALT 30 110* 44  ALKPHOS 476* 418* 364*  BILITOT 5.4* 4.5* 2.0*  PROT 8.1 6.6 6.1*  ALBUMIN 3.8 3.0* 2.8*   Recent Labs  Lab 06/10/19 1517 06/11/19 0540  LIPASE 46 39   No results for input(s): AMMONIA in the last 168 hours. Coagulation Profile: Recent Labs  Lab 06/11/19 0540  INR 1.0   Cardiac Enzymes: No results for input(s): CKTOTAL, CKMB, CKMBINDEX, TROPONINI in the last 168 hours. BNP (last 3 results) No results for input(s): PROBNP in the last 8760 hours. HbA1C: Recent Labs    06/10/19 1517  HGBA1C 6.9*   CBG: Recent Labs  Lab 06/12/19 1149 06/12/19 1648 06/12/19 1950 06/12/19 2347 06/13/19 0455  GLUCAP 135* 202* 277* 258* 214*   Lipid Profile: No results for input(s): CHOL, HDL, LDLCALC, TRIG, CHOLHDL, LDLDIRECT in the last 72 hours. Thyroid Function Tests: No results for input(s): TSH, T4TOTAL, FREET4, T3FREE, THYROIDAB in the last 72 hours. Anemia Panel: No results for input(s): VITAMINB12, FOLATE, FERRITIN, TIBC, IRON, RETICCTPCT in the last 72 hours. Sepsis Labs: No results for input(s): PROCALCITON, LATICACIDVEN in the last 168 hours.  Recent Results (from the past 240 hour(s))  SARS CORONAVIRUS 2 (TAT 6-24 HRS) Nasopharyngeal Nasopharyngeal Swab     Status: None   Collection Time: 06/10/19  8:13 PM   Specimen: Nasopharyngeal Swab  Result Value Ref Range Status   SARS Coronavirus 2 NEGATIVE NEGATIVE Final    Comment: (NOTE) SARS-CoV-2  target nucleic acids are NOT DETECTED. The SARS-CoV-2 RNA is generally detectable in upper and lower respiratory specimens during the acute phase of infection. Negative results do not preclude SARS-CoV-2 infection, do not rule out co-infections with other pathogens, and should not be used as the sole basis for treatment or other patient management decisions. Negative results must be combined with clinical observations, patient history,  and epidemiological information. The expected result is Negative. Fact Sheet for Patients: SugarRoll.be Fact Sheet for Healthcare Providers: https://www.woods-mathews.com/ This test is not yet approved or cleared by the [place] FDA and  has been authorized for detection and/or diagnosis of SARS-CoV-2 by FDA under an Emergency Use Authorization (EUA). This EUA will remain  in effect (meaning this test can be used) for the duration of the COVID-19 declaration under Section 56 4(b)(1) of the Act, 21 U.S.C. section 360bbb-3(b)(1), unless the authorization is terminated or revoked sooner. Performed at [facility], [place] [address] [place], [place] [address]          Radiology Studies: DG C-Arm 1-60 Min-No Report  Result Date: 06/12/2019 Fluoroscopy was utilized by the requesting physician.  No radiographic interpretation.        Scheduled Meds: . amLODipine  10 mg Oral Daily  . carbidopa-levodopa  1 tablet Oral TID  . Chlorhexidine Gluconate Cloth  6 each Topical Q0600  . divalproex  500 mg Oral BID  . hydrALAZINE  50 mg Oral Q8H  . indocyanine green  1.25 mg Intravenous Once  . insulin aspart  0-15 Units Subcutaneous Q4H  . lisinopril  20 mg Oral Daily   Continuous Infusions: . dextrose 5 % and 0.45 % NaCl with KCl 20 mEq/L 75 mL/hr at 06/13/19 0442     LOS: 3 days    Time spent: 30 minutes    [name], DO [facility]   If 7PM-7AM, please contact night-coverage [email or web address] 06/13/2019, 7:31 AM

## 2019-06-13 NOTE — Anesthesia Procedure Notes (Signed)
Procedure Name: Intubation Date/Time: 06/13/2019 4:20 PM Performed by: Jerrye Noble, CRNA Pre-anesthesia Checklist: Patient identified, Emergency Drugs available, Suction available and Patient being monitored Patient Re-evaluated:Patient Re-evaluated prior to induction Oxygen Delivery Method: Circle system utilized Preoxygenation: Pre-oxygenation with 100% oxygen Induction Type: IV induction Ventilation: Oral airway inserted - appropriate to patient size and Mask ventilation without difficulty Laryngoscope Size: McGraph and 3 Grade View: Grade I Tube type: Oral Tube size: 7.5 mm Airway Equipment and Method: Stylet and Video-laryngoscopy Placement Confirmation: positive ETCO2,  ETT inserted through vocal cords under direct vision and breath sounds checked- equal and bilateral Secured at: 22 cm Tube secured with: Tape Dental Injury: Teeth and Oropharynx as per pre-operative assessment

## 2019-06-13 NOTE — Progress Notes (Addendum)
Subjective:  CC: Gilbert Nose Garner Sr. is a 79 y.o. male  Hospital stay day 3,   choledocolithiasis  HPI: S/p ERCP.  No new issues  ROS:  General: Denies weight loss, weight gain, fatigue, fevers, chills, and night sweats. Heart: Denies chest pain, palpitations, racing heart, irregular heartbeat, leg pain or swelling, and decreased activity tolerance. Respiratory: Denies breathing difficulty, shortness of breath, wheezing, cough, and sputum. GI: Denies change in appetite, heartburn, nausea, vomiting, constipation, diarrhea, and blood in stool. GU: Denies difficulty urinating, pain with urinating, urgency, frequency, blood in urine.   Objective:   Temp:  [97.2 F (36.2 C)-98.1 F (36.7 C)] 98 F (36.7 C) (02/17 0427) Pulse Rate:  [61-79] 63 (02/17 0427) Resp:  [10-20] 18 (02/17 0427) BP: (119-202)/(49-105) 119/49 (02/17 0427) SpO2:  [91 %-100 %] 96 % (02/17 0427) Weight:  [86.2 kg] 86.2 kg (02/16 1145)     Height: 5\' 10"  (177.8 cm) Weight: 86.2 kg BMI (Calculated): 27.27   Intake/Output this shift:   Intake/Output Summary (Last 24 hours) at 06/13/2019 0734 Last data filed at 06/13/2019 0502 Gross per 24 hour  Intake 2468.37 ml  Output 1267 ml  Net 1201.37 ml    Constitutional :  alert, cooperative, appears stated age and no distress  Respiratory:  clear to auscultation bilaterally  Cardiovascular:  regular rate and rhythm  Gastrointestinal: soft, non-tender; bowel sounds normal; no masses,  no organomegaly.   Skin: Cool and moist.   Psychiatric: Normal affect, non-agitated, not confused       LABS:  CMP Latest Ref Rng & Units 06/12/2019 06/11/2019 06/10/2019  Glucose 70 - 99 mg/dL 165(H) 166(H) 69(L)  BUN 8 - 23 mg/dL 12 15 17   Creatinine 0.61 - 1.24 mg/dL 1.22 1.26(H) 1.20  Sodium 135 - 145 mmol/L 138 134(L) 134(L)  Potassium 3.5 - 5.1 mmol/L 3.6 4.0 3.1(L)  Chloride 98 - 111 mmol/L 106 100 100  CO2 22 - 32 mmol/L 23 28 24   Calcium 8.9 - 10.3 mg/dL 8.0(L) 8.0(L)  8.5(L)  Total Protein 6.5 - 8.1 g/dL 6.1(L) 6.6 8.1  Total Bilirubin 0.3 - 1.2 mg/dL 2.0(H) 4.5(H) 5.4(H)  Alkaline Phos 38 - 126 U/L 364(H) 418(H) 476(H)  AST 15 - 41 U/L 144(H) 251(H) 340(H)  ALT 0 - 44 U/L 44 110(H) 30   CBC Latest Ref Rng & Units 06/12/2019 06/11/2019 06/10/2019  WBC 4.0 - 10.5 K/uL 4.6 4.7 7.9  Hemoglobin 13.0 - 17.0 g/dL 11.5(L) 12.0(L) 13.8  Hematocrit 39.0 - 52.0 % 36.6(L) 37.9(L) 43.8  Platelets 150 - 400 K/uL 125(L) 128(L) 161    RADS: n/a Assessment:  choledocolithiasis Possible GB neoplasm Abdominal pain HTN DM HLD  S/p ERCP, remain asymptomatic.  Will proceed with robo assisted lap chole tomorrow, understanding there is a chance procedure will be aborted if there is concern for neoplasm intraop. Discussed Gilbert risk of surgery including post-op infxn, seroma, biloma, chronic pain, poor-delayed wound healing, retained gallstone, conversion to open procedure, post-op SBO or ileus, and need for additional procedures to address said risks.  Gilbert risks of general anesthetic including MI, CVA, sudden death or even reaction to anesthetic medications also discussed. Alternatives include continued observation.  Benefits include possible symptom relief, prevention of complications including acute cholecystitis, pancreatitis.  Typical post operative recovery of 3-5 days rest, continued pain in area and incision sites, possible loose stools up to 4-6 weeks, also discussed.  Gilbert [name] understands Gilbert risks, any and all questions were answered to Gilbert  patient's satisfaction

## 2019-06-13 NOTE — OR Nursing (Addendum)
Pt. Had noted pitting edema to right forearm. Upon attempting IV access after releasing tourniquet.noted redness that did not resolve after release of tourniquet to all fingers above first joint. OR nurse, CRNA and Dr. Lysle Pearl made aware with bedside assessment.  No orders received. Noted strong radial pulse in both extremities .

## 2019-06-14 DIAGNOSIS — K831 Obstruction of bile duct: Secondary | ICD-10-CM

## 2019-06-14 DIAGNOSIS — K8051 Calculus of bile duct without cholangitis or cholecystitis with obstruction: Secondary | ICD-10-CM

## 2019-06-14 LAB — COMPREHENSIVE METABOLIC PANEL
ALT: 107 U/L — ABNORMAL HIGH (ref 0–44)
AST: 131 U/L — ABNORMAL HIGH (ref 15–41)
Albumin: 2.9 g/dL — ABNORMAL LOW (ref 3.5–5.0)
Alkaline Phosphatase: 312 U/L — ABNORMAL HIGH (ref 38–126)
Anion gap: 7 (ref 5–15)
BUN: 18 mg/dL (ref 8–23)
CO2: 24 mmol/L (ref 22–32)
Calcium: 8.2 mg/dL — ABNORMAL LOW (ref 8.9–10.3)
Chloride: 104 mmol/L (ref 98–111)
Creatinine, Ser: 1.32 mg/dL — ABNORMAL HIGH (ref 0.61–1.24)
GFR calc Af Amer: 59 mL/min — ABNORMAL LOW (ref >60)
GFR calc non Af Amer: 51 mL/min — ABNORMAL LOW (ref >60)
Glucose, Bld: 249 mg/dL — ABNORMAL HIGH (ref 70–99)
Potassium: 4.4 mmol/L (ref 3.5–5.1)
Sodium: 135 mmol/L (ref 135–145)
Total Bilirubin: 1.5 mg/dL — ABNORMAL HIGH (ref 0.3–1.2)
Total Protein: 6.5 g/dL (ref 6.5–8.1)

## 2019-06-14 LAB — CBC
HCT: 38.8 % — ABNORMAL LOW (ref 39.0–52.0)
Hemoglobin: 12.1 g/dL — ABNORMAL LOW (ref 13.0–17.0)
MCH: 27.8 pg (ref 26.0–34.0)
MCHC: 31.2 g/dL (ref 30.0–36.0)
MCV: 89.2 fL (ref 80.0–100.0)
Platelets: 160 10*3/uL (ref 150–400)
RBC: 4.35 MIL/uL (ref 4.22–5.81)
RDW: 16.4 % — ABNORMAL HIGH (ref 11.5–15.5)
WBC: 9.1 10*3/uL (ref 4.0–10.5)
nRBC: 0 % (ref 0.0–0.2)

## 2019-06-14 LAB — MAGNESIUM: Magnesium: 1.7 mg/dL (ref 1.7–2.4)

## 2019-06-14 LAB — GLUCOSE, CAPILLARY
Glucose-Capillary: 192 mg/dL — ABNORMAL HIGH (ref 70–99)
Glucose-Capillary: 202 mg/dL — ABNORMAL HIGH (ref 70–99)
Glucose-Capillary: 214 mg/dL — ABNORMAL HIGH (ref 70–99)
Glucose-Capillary: 226 mg/dL — ABNORMAL HIGH (ref 70–99)
Glucose-Capillary: 242 mg/dL — ABNORMAL HIGH (ref 70–99)
Glucose-Capillary: 249 mg/dL — ABNORMAL HIGH (ref 70–99)

## 2019-06-14 MED ORDER — INSULIN ASPART PROT & ASPART (70-30 MIX) 100 UNIT/ML ~~LOC~~ SUSP
10.0000 [IU] | Freq: Two times a day (BID) | SUBCUTANEOUS | Status: DC
  Administered 2019-06-14 – 2019-06-15 (×2): 10 [IU] via SUBCUTANEOUS
  Filled 2019-06-14 (×2): qty 10

## 2019-06-14 MED ORDER — ATENOLOL 50 MG PO TABS
50.0000 mg | ORAL_TABLET | Freq: Every day | ORAL | Status: DC
  Administered 2019-06-15: 09:00:00 50 mg via ORAL
  Filled 2019-06-14: qty 1

## 2019-06-14 MED ORDER — ENOXAPARIN SODIUM 40 MG/0.4ML ~~LOC~~ SOLN
40.0000 mg | SUBCUTANEOUS | Status: DC
  Administered 2019-06-14: 21:00:00 40 mg via SUBCUTANEOUS
  Filled 2019-06-14: qty 0.4

## 2019-06-14 MED ORDER — SODIUM CHLORIDE 0.9 % IV SOLN
2.0000 g | INTRAVENOUS | Status: DC
  Administered 2019-06-14 – 2019-06-15 (×2): 2 g via INTRAVENOUS
  Filled 2019-06-14 (×2): qty 2

## 2019-06-14 NOTE — Op Note (Signed)
Preoperative diagnosis:  cholelithiasis  Postoperative diagnosis: Chronic cholecystitis  Procedure: Robotic assisted Laparoscopic subtotal cholecystectomy.   Anesthesia: GETA   Surgeon: Benjamine Sprague  Specimen: Gallbladder  Complications: None  EBL: 73mL  Wound Classification: Clean Contaminated  Indications: see HPI  Findings: Chronic cholecystitis with dense adhesions to omentum and liver No obvious masslike lesions Critical view of safety unable to be dissected out Subtotal cholecystectomy performed to prevent possible iatrogenic injury to common bile duct structures  Description of procedure:  The patient was placed on the operating table in the supine position. SCDs placed, pre-op abx administered.  General anesthesia was induced and OG tube placed by anesthesia. A time-out was completed verifying correct patient, procedure, site, positioning, and implant(s) and/or special equipment prior to beginning this procedure. The abdomen was prepped and draped in the usual sterile fashion.    Veress needle was placed at the Palmer's point and insufflation was started after confirming a positive saline drop test and no immediate increase in abdominal pressure.  After reaching 15 mm, the Veress needle was removed and a 8 mm port was placed via optiview technique under umbilicus measured 0000000 from gallbladder.  The abdomen was inspected and no abnormalities or injuries were found.  Under direct vision, ports were placed in the following locations: One 12 mm patient left of the umbilicus, 8cm from the optiviewed port, one 8 mm port placed to the patient right of the umbilical port 8 cm apart.  1 additional 8 mm port placed lateral to the 54mm port.  Once ports were placed, The table was placed in the reverse Trendelenburg position with the right side up. The Xi platform was brought into the operative field and docked to the ports successfully.  An endoscope was placed through the umbilical port,  fenestrated grasper through the adjacent patient right port, prograsp to the far patient left port, and then a hook cautery in the left port.  Dense adhesions noted between the gallbladder and omentum and liver.  Part of the omentum attached to the gallbladder was also attached to the anterior abdominal wall.  Meticulous dissection and separation was attempted using combination of gentle blunt dissection and and hook cautery to attempt removal of the omental adhesions to the chronically inflamed gallbladder wall.  This proved to be unsuccessful to visualize the triangle of Calot.  The duodenum was noted as the dissection progressed towards the common bile duct structures, so decision was made to abort further dissection in the area to avoid iatrogenic injury.  A dome down approach was then attempted by trying to separate the gallbladder from the liver fossa, but no easy plane was visualized despite multiple attempts, with some injury to the liver wall that had to be addressed with extensive hemostasis with electrocautery.  Due to the inability to fully remove the gallbladder using the top-down approach either, decision was made at this point to proceed with a subtotal cholecystectomy to prevent further iatrogenic injury.  The visualized gallbladder body and part of the infundibulum was transected using electrocautery, and spilled bile as well as the remaining stones within the infundibulum was all suctioned out.  No obvious masslike structures or neoplasms were noted within the resected portion or within the remainder of the gallbladder.  2 openings were noted deep within the infundibulum presumably a cystic duct opening and a possible accessory cystic duct.  The infundibular mucosa was then extensively cauterized prior to placing a 15 mm Blake drain atop the area, through the right lateral  port.  The open gallbladder was then covered with the remaining omentum.  Extensive irrigation and suctioning was then  performed to ensure the majority of leaked bile as well as some purulent thick fluid within the gallbladder fossa was removed.   Hemostasis was checked  and confirmed multiple times throughout the procedure and with abdominal desufflation. The Endo Catch bag was then placed through the 12 mm port and the transected gallbladder specimen was removed. The 12 mm port site closed with Efx Shield device using 0 vicryl under direct vision.  Abdomen desufflated and secondary trocars were removed under direct vision. No bleeding was noted.   3-0 Vicryl used to approximate the deep dermal layer at the 12 mm port site and a millimeter camera port site. all skin incisions then closed with subcuticular sutures of 4-0 monocryl and dressed with topical skin adhesive. The orogastric tube was removed and patient extubated.  The patient tolerated the procedure well and was taken to the postanesthesia care unit in stable condition.  All sponge and instrument count correct at end of procedure.

## 2019-06-14 NOTE — Progress Notes (Addendum)
PROGRESS NOTE  Gilbert [name] Sr. [id number] DOB: January 18, 1941 DOA: 06/10/2019 PCP: [name], MD  Brief History   79 year old man presented with abdominal pain.  Right upper quadrant ultrasound showed a regular cystlike structure.  Admitted for further evaluation.  A & P  Cholelithiasis without cholecystitis.  Choledocholithiasis. --Status post ERCP 2/16 with complete removal of stone and 2 CBD stents placed --Status post subtotal cholecystectomy 2/17 --Mild elevation of LFTs.  Recheck in a.m.  Diabetes mellitus type 2 with hypoglycemia earlier in admission --Resume NovoLog Mix 70/30 at lower dose twice daily  Hypertensive urgency, resolved --Overall stable.  Can restart beta-blocker.  Continue hydralazine, amlodipine, lisinopril  DVT prophylaxis: enoxaparin Code Status: Full Family Communication: wife at bedside Disposition Plan: home when cleared by surgery, GI; possibly tomorrow   Murray Hodgkins, MD  [facility] Direct contact: see [email or web address] (further directions at bottom of note if needed) 7PM-7AM contact night coverage as at bottom of note 06/14/2019, 4:39 PM  LOS: 4 days   Significant Hospital Events   .    Consults:  .    Procedures:  .   Significant Diagnostic Tests:  Marland Kitchen    Micro Data:  .    Antimicrobials:  .   Interval History/Subjective  Feels fine, no pain, no complaints.  Objective   Vitals:  Vitals:   06/14/19 1030 06/14/19 1152  BP: (!) 152/80 (!) 172/74  Pulse: 74 71  Resp:  20  Temp:  97.8 F (36.6 C)  SpO2: 93% 93%    Exam:  Constitutional.  Appears calm, comfortable. Respiratory.  Clear to auscultation bilaterally.  No wheezes, rales or rhonchi.  Normal respiratory effort. Cardiovascular.  Regular rate and rhythm.  No murmur, rub or gallop. Psychiatric.  Grossly normal mood and affect.  Speech fluent and appropriate.  I have personally reviewed the following:   Today's Data  . CBG stable  192-226 . Creatinine trending down, 1.32, alkaline phosphatase, AST, ALT and total bilirubin slightly higher. . CBC unremarkable.  Scheduled Meds: . amLODipine  10 mg Oral Daily  . carbidopa-levodopa  1 tablet Oral TID  . divalproex  500 mg Oral BID  . enoxaparin (LOVENOX) injection  40 mg Subcutaneous Q24H  . hydrALAZINE  50 mg Oral Q8H  . insulin aspart  0-15 Units Subcutaneous Q4H  . lisinopril  20 mg Oral Daily  . magnesium citrate  1 Bottle Oral Once  . polyethylene glycol  17 g Oral Daily   Continuous Infusions: . cefTRIAXone (ROCEPHIN)  IV 2 g (06/14/19 [id number])    Principal Problem:   Abdominal pain Active Problems:   Seizure disorder (HCC)   Type 2 diabetes mellitus with hyperlipidemia (HCC)   Abnormal findings on diagnostic imaging of gall bladder   Obstructive jaundice   History of CVA (cerebrovascular accident) without residual deficits   Hypertensive urgency   Hypoglycemia associated with type 2 diabetes mellitus ([place])   LOS: 4 days   How to contact the [facility] Attending or Consulting provider [place] or covering provider during after hours [place], for this patient?  1. Check the care team in [facility] and look for a) attending/consulting TR[facility] provider listed and b) the [facility] team listed 2. Log into [email or web address] and use [place]'s universal password to access. If you [name] not have the password, please contact the hospital operator. 3. Locate the [facility] provider you are looking for under [facility] and page to a number that you can be directly reached. 4.  If you still have difficulty reaching the provider, please page the [facility] (Director on Call) for the Hospitalists listed on amion for assistance.

## 2019-06-14 NOTE — Evaluation (Signed)
Physical Therapy Evaluation Patient Details Name: Gilbert ASADA Sr. MRN: [id number] DOB: [birth year] Today's Date: 06/14/2019   History of Present Illness  Per MD notes: Pt is a 79 y.o. male with medical history significant for diabetes, hypertension, history of CVA and seizure disorder, who presented to the emergency room with a history of periumbilical mid abdominal pain and is s/p ERCP. pt underwent robo assisted lap chole in the PM on 06/13/2019    Clinical Impression  Patient re-evaluated today following surgery yesterday PM. Continues to be awake and alert and able to provide detailed history. Today states he has full flight of stairs to climb at home with one handrail and does not have a BSC but he also states he does not need one due to having a sink to hold onto. Otherwise, no changes in PLOF. Upon assessment, patient was able to complete bed mobility mod I, transfers with supervision with and without RW. He ambulated with CGA for safety and tended to leave his walker behind or lag behind it, improved with cuing. He completed 8 steps x 2 with short break in between. He was nervous at first but demonstrated functional navigation of steps with CGA for safety. No change in discharge recommendation since surgery. Patient would benefit from continued  skilled physical therapapy to address remaining impairments and functional limitations to work towards stated goals and return to PLOF or maximal functional independence.       Follow Up Recommendations Home health PT    Equipment Recommendations  None recommended by PT    Recommendations for Other Services       Precautions / Restrictions Precautions Precautions: Fall Restrictions Weight Bearing Restrictions: No      Mobility  Bed Mobility Overal bed mobility: Modified Independent             General bed mobility comments: Takes extra time  Transfers Overall transfer level: Needs assistance Equipment used: Rolling walker  (2 wheeled) Transfers: Sit to/from Stand Sit to Stand: Supervision         General transfer comment: provided with min verbal cueing for hand placement; leaves walker behind to go to chair once in room  Ambulation/Gait Ambulation/Gait assistance: Min guard Gait Distance (Feet): 260 Feet Assistive device: Rolling walker (2 wheeled) Gait Pattern/deviations: Shuffle Gait velocity: WFL but slow   General Gait Details: Pt takes small and quick steps during amb that could be described as a minimal shuffle. Increases step length over time. Pt required verbal cueing for RW use but had good carryover after inital cueing. Tends to push walker too far ahead of himself and leave it behind in the room.  Stairs Stairs: Yes Stairs assistance: Min guard Stair Management: One rail Right;Step to pattern;Alternating pattern;Forwards Number of Stairs: 16(2x8) General stair comments: Patient very nervous about stairs at first but states he has full flight he must take at home. Uses step to pattern on descent and step over step on ascent. Required no physical assist.  Wheelchair Mobility    Modified Rankin (Stroke Patients Only)       Balance Overall balance assessment: Needs assistance Sitting-balance support: No upper extremity supported;Feet supported Sitting balance-Leahy Scale: Good Sitting balance - Comments: steady sitting EOB   Standing balance support: No upper extremity supported;During functional activity Standing balance-Leahy Scale: Fair Standing balance comment: Pt was slightly unstead at times with amb but never required more than CGA with amb with a RW     Tandem Stance - Right Leg: (Semi-tandem: Pt  able to complete a UE task in tandem stance with 2 occasions of min assist over a 30 second period)                       Pertinent Vitals/Pain      Home Living Family/patient expects to be discharged to:: Private residence Living Arrangements: Spouse/significant  other Available Help at Discharge: Family Type of Home: House Home Access: Stairs to enter Entrance Stairs-Rails: Right;Left(bil railings but cannot reach both at once) Entrance Stairs-Number of Steps: 3 Home Layout: Two level;Able to live on main level with bedroom/bathroom Home Equipment: Cane - single point;Grab bars - tub/shower;Walker - 2 wheels Additional Comments: has 3 suction grab bars in shower but states that they would probably fall off if he fell when holding on to them; states he has no BSC but doesn' need one    Prior Function Level of Independence: Independent         Comments: community amb, no AD, ind with ADL's. 3 falls in past 6 months secondary to loss of balance without an AD outside of his home     Hand Dominance   Dominant Hand: Right    Extremity/Trunk Assessment                Communication   Communication: No difficulties  Cognition Arousal/Alertness: Awake/alert Behavior During Therapy: WFL for tasks assessed/performed Overall Cognitive Status: Within Functional Limits for tasks assessed                                        General Comments      Exercises Other Exercises Other Exercises: educated on role of PT in acute care setting   Assessment/Plan    PT Assessment Patient needs continued PT services  PT Problem List Decreased strength;Decreased balance;Decreased knowledge of use of DME;Decreased activity tolerance;Decreased mobility       PT Treatment Interventions DME instruction;Therapeutic exercise;Gait training;Balance training;Stair training;Functional mobility training;Therapeutic activities    PT Goals (Current goals can be found in the Care Plan section)  Acute Rehab PT Goals Patient Stated Goal: improve balance, keep strength, and walk PT Goal Formulation: With patient Time For Goal Achievement: 06/26/19 Potential to Achieve Goals: Good    Frequency Min 2X/week   Barriers to discharge   stairs     Co-evaluation               AM-PAC PT "6 Clicks" Mobility  Outcome Measure Help needed turning from your back to your side while in a flat bed without using bedrails?: None Help needed moving from lying on your back to sitting on the side of a flat bed without using bedrails?: None Help needed moving to and from a bed to a chair (including a wheelchair)?: A Little Help needed standing up from a chair using your arms (e.g., wheelchair or bedside chair)?: A Little Help needed to walk in hospital room?: A Little Help needed climbing 3-5 steps with a railing? : A Little 6 Click Score: 20    End of Session Equipment Utilized During Treatment: Gait belt Activity Tolerance: Patient tolerated treatment well;No increased pain Patient left: in chair;with call bell/phone within reach;with chair alarm set Nurse Communication: Mobility status PT Visit Diagnosis: Unsteadiness on feet (R26.81)    Time: [id number] PT Time Calculation (min) (ACUTE ONLY): 25 min   Charges:   PT Evaluation $PT  Re-evaluation: 1 Re-eval PT Treatments $Gait Training: 8-22 mins        Everlean Alstrom. Graylon Good, PT, DPT 06/14/19, 11:04 AM

## 2019-06-14 NOTE — Progress Notes (Addendum)
Inpatient Diabetes Program Recommendations  AACE/ADA: New Consensus Statement on Inpatient Glycemic Control   Target Ranges:  Prepandial:   less than 140 mg/dL      Peak postprandial:   less than 180 mg/dL (1-2 hours)      Critically ill patients:  140 - 180 mg/dL   Results for SEVRIN, GRAHN (MRN [id number]) as of 06/14/2019 12:33  Ref. Range 06/13/2019 07:58 06/13/2019 12:05 06/13/2019 14:57 06/13/2019 19:25 06/14/2019 00:23 06/14/2019 05:16 06/14/2019 07:36 06/14/2019 12:21  Glucose-Capillary Latest Ref Range: 70 - 99 mg/dL 188 (H)  Novolog 3 units 167 (H)  Novolog 3 units 173 (H)    Decadron 5 mg 245 (H)  Novolog 5 units 249 (H)  Novolog 5 units 214 (H)  Novolog 5 units 192 (H)  Novolog 3 units 226 (H)   Review of Glycemic Control  Diabetes history: DM2 Outpatient Diabetes medications: 70/30 30 units BID Current orders for Inpatient glycemic control: Novolog 0-15 units Q4H  Inpatient Diabetes Program Recommendations:   Insulin - Basal: Please consider ordering Lantus 10 units Q24H.  Thanks, Barnie Alderman, RN, MSN, CDE Diabetes Coordinator Inpatient Diabetes Program [phone] (Team Pager from 8am to 5pm)

## 2019-06-14 NOTE — Progress Notes (Addendum)
Subjective:  CC: Gilbert D Skeen Sr. is a 79 y.o. male  Hospital stay day 4, 1 Day Post-Op subtotal cholecystectomy HPI: No issues overnight  ROS:  General: Denies weight loss, weight gain, fatigue, fevers, chills, and night sweats. Heart: Denies chest pain, palpitations, racing heart, irregular heartbeat, leg pain or swelling, and decreased activity tolerance. Respiratory: Denies breathing difficulty, shortness of breath, wheezing, cough, and sputum. GI: Denies change in appetite, heartburn, nausea, vomiting, constipation, diarrhea, and blood in stool. GU: Denies difficulty urinating, pain with urinating, urgency, frequency, blood in urine.   Objective:   Temp:  [97.4 F (36.3 C)-98.6 F (37 C)] 97.7 F (36.5 C) (02/18 0741) Pulse Rate:  [63-94] 70 (02/18 0741) Resp:  [15-26] 18 (02/18 0741) BP: (132-191)/(60-82) 152/78 (02/18 0741) SpO2:  [92 %-97 %] 93 % (02/18 0741) Weight:  [86.2 kg] 86.2 kg (02/17 1459)     Height: 5\' 10"  (177.8 cm) Weight: 86.2 kg BMI (Calculated): 27.27   Intake/Output this shift:   Intake/Output Summary (Last 24 hours) at 06/14/2019 1031 Last data filed at 06/14/2019 [id number] Gross per 24 hour  Intake 2148.64 ml  Output 1136 ml  Net 1012.64 ml  66ml/24 hr  Constitutional :  alert, cooperative, appears stated age and no distress  Respiratory:  clear to auscultation bilaterally  Cardiovascular:  regular rate and rhythm  Gastrointestinal: soft, non-tender; bowel sounds normal; no masses,  no organomegaly. JP with serosanguinous discharge  Skin: Cool and moist.   Psychiatric: Normal affect, non-agitated, not confused       LABS:  CMP Latest Ref Rng & Units 06/14/2019 06/13/2019 06/12/2019  Glucose 70 - 99 mg/dL 249(H) 212(H) 165(H)  BUN 8 - 23 mg/dL 18 17 12   Creatinine 0.61 - 1.24 mg/dL 1.32(H) 1.49(H) 1.22  Sodium 135 - 145 mmol/L 135 135 138  Potassium 3.5 - 5.1 mmol/L 4.4 4.0 3.6  Chloride 98 - 111 mmol/L 104 104 106  CO2 22 - 32 mmol/L 24 23 23    Calcium 8.9 - 10.3 mg/dL 8.2(L) 8.2(L) 8.0(L)  Total Protein 6.5 - 8.1 g/dL 6.5 6.2(L) 6.1(L)  Total Bilirubin 0.3 - 1.2 mg/dL 1.5(H) 1.5(H) 2.0(H)  Alkaline Phos 38 - 126 U/L 312(H) 294(H) 364(H)  AST 15 - 41 U/L 131(H) 85(H) 144(H)  ALT 0 - 44 U/L 107(H) 65(H) 44   CBC Latest Ref Rng & Units 06/14/2019 06/13/2019 06/12/2019  WBC 4.0 - 10.5 K/uL 9.1 6.9 4.6  Hemoglobin 13.0 - 17.0 g/dL 12.1(L) 11.2(L) 11.5(L)  Hematocrit 39.0 - 52.0 % 38.8(L) 35.2(L) 36.6(L)  Platelets 150 - 400 K/uL 160 148(L) 125(L)    RADS: n/a Assessment:  choledocolithiasis Possible GB neoplasm Abdominal pain HTN DM HLD  Chronic cholecystitis noted to the point entire GB could not be removed secondary to the dense adhesions and chronically inflamed tissue.  I explained to the wife after the procedure that a subtotal cholecystectomy is a acceptable alternative in these Cases, and that it is rare to require additional procedures to complete the cholecystectomy.  Fortunately the ERCP procedure done prior will hopefully keep biliary leak minimal, if not prevent it completely.  We will continue to monitor closely especially with the JP output as he advances his diet slowly.  If all goes well with the patient tolerating a diet, pain control and no evidence of biliary leak through the JP drain, plan is for patient to be discharged by tomorrow morning.    Recommend continuing IV antibiotics in the meantime and completing with an oral  course as an outpatient due to the fair amount of purulent bilious material noted during the dissection process Intra-Op.

## 2019-06-14 NOTE — Progress Notes (Signed)
Gilbert [name] , MD [address], [place], [place], [place], [address] [address], [place], [place], [place], [address] Phone: [phone]  Fax: [phone]   [name] is being followed for choledocholithiasis day 2 of follow up   Subjective: Mild pain at surgery site when moves, otherwise feels well    Objective: Vital signs in last 24 hours: Vitals:   06/13/19 2117 06/13/19 2246 06/14/19 0523 06/14/19 0741  BP: (!) 158/79 (!) 172/78 (!) 160/76 (!) 152/78  Pulse: 88 94 78 70  Resp: 20 20 20 18   Temp: 97.9 F (36.6 C) 97.8 F (36.6 C) 98.4 F (36.9 C) 97.7 F (36.5 C)  TempSrc: Oral Oral Oral Oral  SpO2: 93% 94% 94% 93%  Weight:      Height:       Weight change: 0 kg  Intake/Output Summary (Last 24 hours) at 06/14/2019 0915 Last data filed at 06/14/2019 [id number] Gross per 24 hour  Intake 2148.64 ml  Output 1136 ml  Net 1012.64 ml     Exam: Abdomen: soft, nontender, normal bowel sounds, surgical drain seen and scars from surgery for port   Lab Results: @LABTEST2 @ Micro Results: Recent Results (from the past 240 hour(s))  SARS CORONAVIRUS 2 (TAT 6-24 HRS) Nasopharyngeal Nasopharyngeal Swab     Status: None   Collection Time: 06/10/19  8:13 PM   Specimen: Nasopharyngeal Swab  Result Value Ref Range Status   SARS Coronavirus 2 NEGATIVE NEGATIVE Final    Comment: (NOTE) SARS-CoV-2 target nucleic acids are NOT DETECTED. The SARS-CoV-2 RNA is generally detectable in upper and lower respiratory specimens during the acute phase of infection. Negative results do not preclude SARS-CoV-2 infection, do not rule out co-infections with other pathogens, and should not be used as the sole basis for treatment or other patient management decisions. Negative results must be combined with clinical observations, patient history, and epidemiological information. The expected result is Negative. Fact Sheet for  Patients: SugarRoll.be Fact Sheet for Healthcare Providers: https://www.woods-mathews.com/ This test is not yet approved or cleared by the [place] FDA and  has been authorized for detection and/or diagnosis of SARS-CoV-2 by FDA under an Emergency Use Authorization (EUA). This EUA will remain  in effect (meaning this test can be used) for the duration of the COVID-19 declaration under Section 56 4(b)(1) of the Act, 21 U.S.C. section 360bbb-3(b)(1), unless the authorization is terminated or revoked sooner. Performed at [facility], [place] [address], [place], [place] [address]    Studies/Results: DG C-Arm 1-60 Min-No Report  Result Date: 06/12/2019 Fluoroscopy was utilized by the requesting physician.  No radiographic interpretation.   Medications: I have reviewed the patient's current medications. Scheduled Meds: . amLODipine  10 mg Oral Daily  . carbidopa-levodopa  1 tablet Oral TID  . divalproex  500 mg Oral BID  . hydrALAZINE  50 mg Oral Q8H  . insulin aspart  0-15 Units Subcutaneous Q4H  . lisinopril  20 mg Oral Daily  . magnesium citrate  1 Bottle Oral Once  . polyethylene glycol  17 g Oral Daily   Continuous Infusions: . cefTRIAXone (ROCEPHIN)  IV 2 g (06/14/19 0812)   PRN Meds:.acetaminophen **OR** acetaminophen, bisacodyl, hydrALAZINE, labetalol, morphine injection, ondansetron **OR** ondansetron (ZOFRAN) IV   Assessment: Principal Problem:   Abdominal pain Active Problems:   Seizure disorder (HCC)   Type 2 diabetes mellitus with hyperlipidemia (HCC)   Abnormal findings on diagnostic imaging of gall bladder   Obstructive jaundice  History of CVA (cerebrovascular accident) without residual deficits   Hypertensive urgency   Hypoglycemia associated with type 2 diabetes mellitus ([place])   [name] 79 y.o. male who is status post laparoscopic cholecystectomy and ERCP for choledocholithiasis.  Underwent a  stent placement 2 days back.  Transaminases and total bilirubin decline but have gone up a bit minimally.  Plan: 1.  Recheck LFTs tomorrow.   LOS: 4 days   Gilbert Bellows, MD 06/14/2019, 9:15 AM

## 2019-06-15 ENCOUNTER — Encounter: Payer: Self-pay | Admitting: Internal Medicine

## 2019-06-15 DIAGNOSIS — K811 Chronic cholecystitis: Secondary | ICD-10-CM

## 2019-06-15 LAB — COMPREHENSIVE METABOLIC PANEL
ALT: 17 U/L (ref 0–44)
AST: 76 U/L — ABNORMAL HIGH (ref 15–41)
Albumin: 2.6 g/dL — ABNORMAL LOW (ref 3.5–5.0)
Alkaline Phosphatase: 202 U/L — ABNORMAL HIGH (ref 38–126)
Anion gap: 7 (ref 5–15)
BUN: 22 mg/dL (ref 8–23)
CO2: 26 mmol/L (ref 22–32)
Calcium: 7.9 mg/dL — ABNORMAL LOW (ref 8.9–10.3)
Chloride: 106 mmol/L (ref 98–111)
Creatinine, Ser: 1.42 mg/dL — ABNORMAL HIGH (ref 0.61–1.24)
GFR calc Af Amer: 54 mL/min — ABNORMAL LOW (ref >60)
GFR calc non Af Amer: 46 mL/min — ABNORMAL LOW (ref >60)
Glucose, Bld: 92 mg/dL (ref 70–99)
Potassium: 3.7 mmol/L (ref 3.5–5.1)
Sodium: 139 mmol/L (ref 135–145)
Total Bilirubin: 1.1 mg/dL (ref 0.3–1.2)
Total Protein: 5.7 g/dL — ABNORMAL LOW (ref 6.5–8.1)

## 2019-06-15 LAB — GLUCOSE, CAPILLARY
Glucose-Capillary: 106 mg/dL — ABNORMAL HIGH (ref 70–99)
Glucose-Capillary: 140 mg/dL — ABNORMAL HIGH (ref 70–99)
Glucose-Capillary: 153 mg/dL — ABNORMAL HIGH (ref 70–99)
Glucose-Capillary: 89 mg/dL (ref 70–99)

## 2019-06-15 LAB — SURGICAL PATHOLOGY

## 2019-06-15 MED ORDER — SODIUM CHLORIDE 0.9 % IV SOLN: INTRAVENOUS | Status: DC | PRN

## 2019-06-15 MED ORDER — AMOXICILLIN-POT CLAVULANATE 875-125 MG PO TABS: 1.0000 | ORAL_TABLET | Freq: Two times a day (BID) | ORAL | 0 refills | Status: AC

## 2019-06-15 NOTE — Progress Notes (Signed)
06/15/2019 12:59  Gilbert D Bahe Sr. to be D/C'd Home per MD order.  Discussed prescriptions and follow up appointments with the patient. Prescriptions given to patient, medication list explained in detail. Pt verbalized understanding.  Allergies as of 06/15/2019   No Known Allergies     Medication List    TAKE these medications   amLODipine 10 MG tablet Commonly known as: NORVASC Take 1 tablet (10 mg total) by mouth daily. Notes to patient: Morning 06/16/19   amoxicillin-clavulanate 875-125 MG tablet Commonly known as: Augmentin Take 1 tablet by mouth 2 (two) times daily for 7 days. Notes to patient: Before bed 06/15/19   aspirin 81 MG EC tablet Take 1 tablet (81 mg total) by mouth daily. Notes to patient: Morning 06/16/19   atenolol 50 MG tablet Commonly known as: TENORMIN Take 50 mg by mouth daily. Notes to patient: Morning 06/16/19   atorvastatin 80 MG tablet Commonly known as: LIPITOR Take 40 mg by mouth at bedtime. Notes to patient: Before bed 06/15/19   carbidopa-levodopa 25-100 MG tablet Commonly known as: SINEMET IR Take 1 tablet by mouth 3 (three) times daily. Notes to patient: Afternoon 06/15/19   divalproex 500 MG 24 hr tablet Commonly known as: DEPAKOTE ER Take 500 mg by mouth 2 (two) times daily. Notes to patient: Before bed 06/15/19   donepezil 10 MG tablet Commonly known as: ARICEPT Take 10 mg by mouth daily. Notes to patient: Morning 06/16/19   hydrALAZINE 50 MG tablet Commonly known as: APRESOLINE Take 50 mg by mouth 3 (three) times daily. Notes to patient: Before bed 06/16/19   insulin aspart protamine- aspart (70-30) 100 UNIT/ML injection Commonly known as: NOVOLOG MIX 70/30 Inject 20-45 Units into the skin See admin instructions. Inject 45u under the skin every morning and inject 20u under the skin every night Notes to patient: Evening 06/15/19   levothyroxine 75 MCG tablet Commonly known as: SYNTHROID Take 75 mcg by mouth daily before  breakfast. Notes to patient: Before breakfast 06/16/19   lisinopril 40 MG tablet Commonly known as: ZESTRIL Take 1 tablet (40 mg total) by mouth at bedtime. Notes to patient: Before bed 06/15/19   vitamin C 500 MG tablet Commonly known as: ASCORBIC ACID Take 1,000 mg by mouth daily. Notes to patient: Morning 06/16/19       Vitals:   06/15/19 0735 06/15/19 1154  BP: (!) 149/69 (!) 171/78  Pulse: 73 69  Resp:  16  Temp: 98.3 F (36.8 C) 98.4 F (36.9 C)  SpO2: 96% 91%    Skin clean, dry and intact without evidence of skin break down, no evidence of skin tears noted. IV catheter discontinued intact. Site without signs and symptoms of complications. Dressing and pressure applied. Pt denies pain at this time. No complaints noted.  An After Visit Summary was printed and given to the patient. Patient escorted via [place], and D/C home via private auto.  Gilbert [name]

## 2019-06-15 NOTE — Progress Notes (Signed)
Subjective:  CC: Gilbert D Shevchenko Sr. is a 79 y.o. male  Hospital stay day 5, 2 Days Post-Op subtotal cholecystectomy HPI: No issues overnight. Tolerating regular diet  ROS:  General: Denies weight loss, weight gain, fatigue, fevers, chills, and night sweats. Heart: Denies chest pain, palpitations, racing heart, irregular heartbeat, leg pain or swelling, and decreased activity tolerance. Respiratory: Denies breathing difficulty, shortness of breath, wheezing, cough, and sputum. GI: Denies change in appetite, heartburn, nausea, vomiting, constipation, diarrhea, and blood in stool. GU: Denies difficulty urinating, pain with urinating, urgency, frequency, blood in urine.   Objective:   Temp:  [97.8 F (36.6 C)-98.5 F (36.9 C)] 98.3 F (36.8 C) (02/19 0735) Pulse Rate:  [71-79] 73 (02/19 0735) Resp:  [20] 20 (02/19 0341) BP: (116-172)/(47-80) 149/69 (02/19 0735) SpO2:  [92 %-96 %] 96 % (02/19 0735)     Height: 5\' 10"  (177.8 cm) Weight: 86.2 kg BMI (Calculated): 27.27   Intake/Output this shift:   Intake/Output Summary (Last 24 hours) at 06/15/2019 1009 Last data filed at 06/15/2019 0725 Gross per 24 hour  Intake 340 ml  Output 1060 ml  Net -720 ml  48ml/24 hr  Constitutional :  alert, cooperative, appears stated age and no distress  Respiratory:  clear to auscultation bilaterally  Cardiovascular:  regular rate and rhythm  Gastrointestinal: soft, non-tender; bowel sounds normal; no masses,  no organomegaly. JP with serosanguinous discharge  Skin: Cool and moist.   Psychiatric: Normal affect, non-agitated, not confused       LABS:  CMP Latest Ref Rng & Units 06/15/2019 06/14/2019 06/13/2019  Glucose 70 - 99 mg/dL 92 249(H) 212(H)  BUN 8 - 23 mg/dL 22 18 17   Creatinine 0.61 - 1.24 mg/dL 1.42(H) 1.32(H) 1.49(H)  Sodium 135 - 145 mmol/L 139 135 135  Potassium 3.5 - 5.1 mmol/L 3.7 4.4 4.0  Chloride 98 - 111 mmol/L 106 104 104  CO2 22 - 32 mmol/L 26 24 23   Calcium 8.9 - 10.3  mg/dL 7.9(L) 8.2(L) 8.2(L)  Total Protein 6.5 - 8.1 g/dL 5.7(L) 6.5 6.2(L)  Total Bilirubin 0.3 - 1.2 mg/dL 1.1 1.5(H) 1.5(H)  Alkaline Phos 38 - 126 U/L 202(H) 312(H) 294(H)  AST 15 - 41 U/L 76(H) 131(H) 85(H)  ALT 0 - 44 U/L 17 107(H) 65(H)   CBC Latest Ref Rng & Units 06/14/2019 06/13/2019 06/12/2019  WBC 4.0 - 10.5 K/uL 9.1 6.9 4.6  Hemoglobin 13.0 - 17.0 g/dL 12.1(L) 11.2(L) 11.5(L)  Hematocrit 39.0 - 52.0 % 38.8(L) 35.2(L) 36.6(L)  Platelets 150 - 400 K/uL 160 148(L) 125(L)    RADS: n/a Assessment:  choledocolithiasis Possible GB neoplasm Abdominal pain HTN DM HLD  Chronic cholecystitis noted to the point entire GB could not be removed secondary to the dense adhesions and chronically inflamed tissue.  I explained to the wife after the procedure that a subtotal cholecystectomy is a acceptable alternative in these Cases, and that it is rare to require additional procedures to complete the cholecystectomy.  Fortunately the ERCP procedure done prior will keep biliary leak minimal, if not prevent it completely.    Ok to be d/c'd from [employer] standpoint with oral abx.  F/u one week for drain removal.  Also likely need f/u for his Cr level with PCP

## 2019-06-15 NOTE — Discharge Summary (Signed)
Physician Discharge Summary  Gilbert FEEHAN Sr. [id number] DOB: Nov 11, 1940 DOA: 06/10/2019  PCP: [name], MD  Admit date: 06/10/2019 Discharge date: 06/15/2019  Recommendations for Outpatient Follow-up:  Outpatient follow-up with general surgery: Status post subtotal cholecystectomy  Follow-up Information    Lysle Pearl, Isami, DO Follow up in 1 week(s).   Specialty: Surgery Why: post op, possible drain removal Contact information: [place] [address] [phone]            Discharge Diagnoses: Principal diagnosis is #1 1. Chronic cholelithiasis without cholecystitis.  Choledocholithiasis. 2. Diabetes mellitus type 2 3. CKD stage III 4. Hypertensive urgency 5. Parkinson's disease 6. Seizure disorder  Discharge Condition: improved Disposition: With home health PT  Diet recommendation: heart healthy, diabetic diet  Filed Weights   06/10/19 1513 06/12/19 1145 06/13/19 1459  Weight: 86.2 kg 86.2 kg 86.2 kg    History of present illness:  79 year old man presented with abdominal pain.  Right upper quadrant ultrasound showed a regular cystlike structure.  Admitted for further evaluation.   Hospital Course:   Underwent MRCP and was seen by gastroenterology and general surgery.  MRCP confirmed choledocholithiasis, seen by GI and underwent ERCP with complete removal of stone and placement of 2 CBD stents 2/16.  Underwent subtotal cholecystectomy 2/18.  LFTs were mildly elevated and gastroenterology recommended watching overnight.  2/19 LFTs were decreased and patient was cleared for discharge home on oral antibiotics.  I discussed with wife by telephone.  Chronic cholelithiasis without cholecystitis.  Choledocholithiasis.  Right upper quadrant ultrasound showed cholelithiasis without cholecystitis, irregular masslike structure.  CBD stone.  MRCP confirmed choledocholithiasis. --Status post ERCP 2/16 with complete removal of stone and 2 CBD stents  placed --Status post subtotal cholecystectomy 2/17 given dense adhesions and chronically inflamed tissue. --LFTs trending down --Home today on Augmentin for 1 week, discussed with Dr. Lysle Pearl.  Follow-up with him in 1 week for drain removal.  Diabetes mellitus type 2 with hypoglycemia earlier in admission --Resume NovoLog Mix 70/30 at lower dose twice daily  CKD stage III.  Follow-up as an outpatient.  Hypertensive urgency, resolved --Overall stable.  Can restart beta-blocker.  Continue hydralazine, amlodipine, lisinopril  Parkinson's disease --Continue Sinemet  Seizure disorder --Continue Depakote  Significant Hospital Events    2/14 admitted for abdominal pain further evaluation of gallbladder.  2/15 gastroenterology, palliative medicine, general surgery consultations    Consults:   Gastroenterology  Palliative medicine  General surgery  Procedures:   2/16 ERCP showed choledocholithiasis which was removed with biliary sphincterotomy.  2 plastic stents placed in common bile duct.  Significant Diagnostic Tests:     Micro Data:     Antimicrobials:     Today's assessment: S: feels fine, no complaints O: Vitals:  Vitals:   06/15/19 0341 06/15/19 0735  BP: (!) 116/47 (!) 149/69  Pulse: 73 73  Resp: 20   Temp: 98.5 F (36.9 C) 98.3 F (36.8 C)  SpO2: 92% 96%    Constitutional:  . Appears calm and comfortable Respiratory:  . CTA bilaterally, no w/r/r.  . Respiratory effort normal.  Cardiovascular:  . RRR, no m/r/g . No LE extremity edema   Psychiatric:  . Mental status o Mood, affect appropriate  BMP unremarkable, creatinine stable at 1.42 AST trending down, total bilirubin has normalized.  ALT has normalized.  Discharge Instructions  Discharge Instructions    Diet - low sodium heart healthy   Complete by: As directed    Diet Carb Modified  Complete by: As directed    Discharge instructions   Complete by: As directed    Call  your physician or seek immediate medical attention for fever, pain, bleeding, rash, vomiting or worsening of condition.   Increase activity slowly   Complete by: As directed      Allergies as of 06/15/2019   No Known Allergies     Medication List    TAKE these medications   amLODipine 10 MG tablet Commonly known as: NORVASC Take 1 tablet (10 mg total) by mouth daily.   amoxicillin-clavulanate 875-125 MG tablet Commonly known as: Augmentin Take 1 tablet by mouth 2 (two) times daily for 7 days.   aspirin 81 MG EC tablet Take 1 tablet (81 mg total) by mouth daily.   atenolol 50 MG tablet Commonly known as: TENORMIN Take 50 mg by mouth daily.   atorvastatin 80 MG tablet Commonly known as: LIPITOR Take 40 mg by mouth at bedtime.   carbidopa-levodopa 25-100 MG tablet Commonly known as: SINEMET IR Take 1 tablet by mouth 3 (three) times daily.   divalproex 500 MG 24 hr tablet Commonly known as: DEPAKOTE ER Take 500 mg by mouth 2 (two) times daily.   donepezil 10 MG tablet Commonly known as: ARICEPT Take 10 mg by mouth daily.   hydrALAZINE 50 MG tablet Commonly known as: APRESOLINE Take 50 mg by mouth 3 (three) times daily.   insulin aspart protamine- aspart (70-30) 100 UNIT/ML injection Commonly known as: NOVOLOG MIX 70/30 Inject 20-45 Units into the skin See admin instructions. Inject 45u under the skin every morning and inject 20u under the skin every night   levothyroxine 75 MCG tablet Commonly known as: SYNTHROID Take 75 mcg by mouth daily before breakfast.   lisinopril 40 MG tablet Commonly known as: ZESTRIL Take 1 tablet (40 mg total) by mouth at bedtime.   vitamin C 500 MG tablet Commonly known as: ASCORBIC ACID Take 1,000 mg by mouth daily.      No Known Allergies  The results of significant diagnostics from this hospitalization (including imaging, microbiology, ancillary and laboratory) are listed below for reference.    Significant Diagnostic  Studies: MR 3D Recon At Scanner  Result Date: 06/11/2019 CLINICAL DATA:  Nonspecific (abnormal) findings on radiological and other examination of musculoskeletal system EXAM: 3-DIMENSIONAL MR IMAGE RENDERING ON ACQUISITION WORKSTATION TECHNIQUE: 3-dimensional MR images were rendered by post-processing of the original MR data on an acquisition workstation. The 3-dimensional MR images were interpreted and findings were reported in the accompanying complete MR report for this study COMPARISON:  None. FINDINGS: See report findings on MRI IMPRESSION: See report impression on accompanying MRI Electronically Signed   By: Suzy Bouchard M.D.   On: 06/11/2019 08:23   DG C-Arm 1-60 Min-No Report  Result Date: 06/12/2019 Fluoroscopy was utilized by the requesting physician.  No radiographic interpretation.   MR ABDOMEN MRCP W WO CONTAST  Result Date: 06/11/2019 CLINICAL DATA:  Periumbilical pain. Abdominal pain. Cholelithiasis. EXAM: MRI ABDOMEN WITHOUT AND WITH CONTRAST (INCLUDING MRCP) TECHNIQUE: Multiplanar multisequence MR imaging of the abdomen was performed both before and after the administration of intravenous contrast. Heavily T2-weighted images of the biliary and pancreatic ducts were obtained, and three-dimensional MRCP images were rendered by post processing. CONTRAST:  7.52mL GADAVIST GADOBUTROL 1 MMOL/ML IV SOLN COMPARISON:  Ultrasound 06/10/2019, HIDA scan 03/21/2019, CT 03/14/2019 FINDINGS: Lower chest:  Lung bases are clear. Hepatobiliary: Mild intrahepatic biliary duct dilatation. The extrahepatic ducts are mildly dilated. The common hepatic  duct measures 8 mm. Common bile duct measures 8 mm. There are multiple round filling defects within the mid and distal common bile duct (image 18/11). Approximately 5 small filling defects measuring approximately 4-5 mm each consistent choledocholithiasis. These distal stones have high signal intensity on noncontrast T1 weighted imaging (image 46/19) typical  of cholesterol stones. Within the neck of the gallbladder, there is similar cluster of material tissue which has high signal intensity on noncontrast T1 weighted imaging (image 39/9). There are gallstones collecting in the neck the gallbladder on comparison CT. Favor mixture of stones and sludge with some chronic inflammation. Chronic narrowing of the neck of the gallbladder associated the stones in probable sludge. Within the fundus of the gallbladder there is wall adherent lesion measuring approximately 10 mm (image 18/10). This lesion also has inherent T1 shortening similar to the stones and sludge in the neck of the gallbladder and common bile duct stones. Pancreas: Normal pancreatic parenchymal intensity. No ductal dilatation or inflammation. Spleen: Normal spleen. Adrenals/urinary tract: Adrenal glands and kidneys are normal. Stomach/Bowel: Stomach and limited of the small bowel is unremarkable Vascular/Lymphatic: Abdominal aortic normal caliber. No retroperitoneal periportal lymphadenopathy. Musculoskeletal: No aggressive osseous lesion IMPRESSION: 1. Mild intra and extrahepatic biliary duct dilatation associated with partially obstructing stones in the common bile duct. 2. Collection of gallstones and sludge in the neck of the gallbladder with chronic gallbladder neck narrowing. No gallbladder distension. 3. Lesion in the fundus of the gallbladder which corresponds to ultrasound abnormality. This lesion has similar imaging characteristics to the stones and sludge in the neck the gallbladder and favored wall adherent sludge and stones. Cannot completely exclude gallbladder neoplasm. No evidence of extension of the process beyond the gallbladder wall. Electronically Signed   By: Suzy Bouchard M.D.   On: 06/11/2019 08:16   US Abdomen Limited RUQ  Result Date: 06/10/2019 CLINICAL DATA:  Upper abdominal pain for 3 days. EXAM: ULTRASOUND ABDOMEN LIMITED RIGHT UPPER QUADRANT COMPARISON:  None. FINDINGS:  Gallbladder: Irregular masslike structure within the gallbladder, neoplastic versus tumefactive sludge. 7 mm gallstone identified. No pericholecystic fluid. No sonographic Murphy's sign elicited, per the sonographer. Common bile duct: Diameter: 6 mm Liver: No focal lesion identified. Within normal limits in parenchymal echogenicity. Portal vein is patent on color Doppler imaging with normal direction of blood flow towards the liver. Other: None. IMPRESSION: 1. Irregular masslike structure within the gallbladder, neoplastic process versus tumefactive sludge. 2. Cholelithiasis without evidence of acute cholecystitis. 3. Liver appears normal. Electronically Signed   By: Franki Cabot M.D.   On: 06/10/2019 18:19    Microbiology: Recent Results (from the past 240 hour(s))  SARS CORONAVIRUS 2 (TAT 6-24 HRS) Nasopharyngeal Nasopharyngeal Swab     Status: None   Collection Time: 06/10/19  8:13 PM   Specimen: Nasopharyngeal Swab  Result Value Ref Range Status   SARS Coronavirus 2 NEGATIVE NEGATIVE Final    Comment: (NOTE) SARS-CoV-2 target nucleic acids are NOT DETECTED. The SARS-CoV-2 RNA is generally detectable in upper and lower respiratory specimens during the acute phase of infection. Negative results do not preclude SARS-CoV-2 infection, do not rule out co-infections with other pathogens, and should not be used as the sole basis for treatment or other patient management decisions. Negative results must be combined with clinical observations, patient history, and epidemiological information. The expected result is Negative. Fact Sheet for Patients: SugarRoll.be Fact Sheet for Healthcare Providers: https://www.woods-mathews.com/ This test is not yet approved or cleared by the [place] FDA and  has  been authorized for detection and/or diagnosis of SARS-CoV-2 by FDA under an Emergency Use Authorization (EUA). This EUA will remain  in effect (meaning  this test can be used) for the duration of the COVID-19 declaration under Section 56 4(b)(1) of the Act, 21 U.S.C. section 360bbb-3(b)(1), unless the authorization is terminated or revoked sooner. Performed at [facility], [place] [address] [place], [place] [address]      Labs: Basic Metabolic Panel: Recent Labs  Lab 06/10/19 1517 06/10/19 1517 06/11/19 0540 06/12/19 0527 06/13/19 0716 06/14/19 0439 06/15/19 0337  NA 134*   < > 134* 138 135 135 139  K 3.1*   < > 4.0 3.6 4.0 4.4 3.7  CL 100   < > 100 106 104 104 106  CO2 24   < > 28 23 23 24 26   GLUCOSE 69*   < > 166* 165* 212* 249* 92  BUN 17   < > 15 12 17 18 22   CREATININE 1.20   < > 1.26* 1.22 1.49* 1.32* 1.42*  CALCIUM 8.5*   < > 8.0* 8.0* 8.2* 8.2* 7.9*  MG 2.0  --   --  1.6* 2.0 1.7  --    < > = values in this interval not displayed.   Liver Function Tests: Recent Labs  Lab 06/11/19 0540 06/12/19 0527 06/13/19 0716 06/14/19 0439 06/15/19 0337  AST 251* 144* 85* 131* 76*  ALT 110* 44 65* 107* 17  ALKPHOS 418* 364* 294* 312* 202*  BILITOT 4.5* 2.0* 1.5* 1.5* 1.1  PROT 6.6 6.1* 6.2* 6.5 5.7*  ALBUMIN 3.0* 2.8* 2.7* 2.9* 2.6*   Recent Labs  Lab 06/10/19 1517 06/11/19 0540  LIPASE 46 39   CBC: Recent Labs  Lab 06/10/19 1517 06/11/19 0540 06/12/19 0527 06/13/19 0716 06/14/19 0439  WBC 7.9 4.7 4.6 6.9 9.1  HGB 13.8 12.0* 11.5* 11.2* 12.1*  HCT 43.8 37.9* 36.6* 35.2* 38.8*  MCV 88.1 87.7 87.4 88.2 89.2  PLT 161 128* 125* 148* 160    CBG: Recent Labs  Lab 06/14/19 1615 06/14/19 1959 06/15/19 0001 06/15/19 0342 06/15/19 0727  GLUCAP 202* 242* 153* 40 106*    Principal Problem:   Abdominal pain Active Problems:   Seizure disorder (HCC)   Type 2 diabetes mellitus with hyperlipidemia (HCC)   Abnormal findings on diagnostic imaging of gall bladder   Obstructive jaundice   History of CVA (cerebrovascular accident) without residual deficits   Hypertensive urgency   Hypoglycemia  associated with type 2 diabetes mellitus ([place])   Time coordinating discharge: 35 minutes  Signed:  Murray Hodgkins, MD  [facility]  06/15/2019, 11:50 AM

## 2019-06-15 NOTE — Care Management Important Message (Signed)
Important Message  Patient Details  Name: Gilbert SILERIO Sr. MRN: [id number] Date of Birth: Feb 19, 1941   Medicare Important Message Given:  Yes     [name] 06/15/2019, 10:55 AM

## 2019-06-15 NOTE — Discharge Instructions (Signed)
Laparoscopic Cholecystectomy, Care After This sheet gives you information about how to care for yourself after your procedure. Your doctor may also give you more specific instructions. If you have problems or questions, contact your doctor. Follow these instructions at home: Care for cuts from surgery (incisions)   Follow instructions from your doctor about how to take care of your cuts from surgery. Make sure you: ? Wash your hands with soap and water before you change your bandage (dressing). If you cannot use soap and water, use hand sanitizer. ? Change your bandage as told by your doctor. ? Leave stitches (sutures), skin glue, or skin tape (adhesive) strips in place. They may need to stay in place for 2 weeks or longer. If tape strips get loose and curl up, you may trim the loose edges. Do not remove tape strips completely unless your doctor says it is okay.  Do not take baths, swim, or use a hot tub until your doctor says it is okay. OK TO SHOWER 24HRS AFTER YOUR SURGERY.   RECORD DAILY OUPUT FROM JP DRAIN AS INSTRUCTED.  PLEASE BRING RECORDS TO NEXT OFFICE VISIT  Check your surgical cut area every day for signs of infection. Check for: ? More redness, swelling, or pain. ? More fluid or blood. ? Warmth. ? Pus or a bad smell. Activity  Do not drive or use heavy machinery while taking prescription pain medicine.  Do not play contact sports until your doctor says it is okay.  Do not drive for 24 hours if you were given a medicine to help you relax (sedative).  Rest as needed. Do not return to work or school until your doctor says it is okay. General instructions .  tylenol and advil as needed for discomfort.  Please alternate between the two every four hours as needed for pain.   .  Use narcotics, if prescribed, only when tylenol and motrin is not enough to control pain. .  325-650mg  every 8hrs to max of 3000mg /24hrs (including the 325mg  in every norco dose) for the tylenol.   .   Advil up to 800mg  per dose every 8hrs as needed for pain.    To prevent or treat constipation while you are taking prescription pain medicine, your doctor may recommend that you: ? Drink enough fluid to keep your pee (urine) clear or pale yellow. ? Take over-the-counter or prescription medicines. ? Eat foods that are high in fiber, such as fresh fruits and vegetables, whole grains, and beans. ? Limit foods that are high in fat and processed sugars, such as fried and sweet foods. Contact a doctor if:  You develop a rash.  You have more redness, swelling, or pain around your surgical cuts.  You have more fluid or blood coming from your surgical cuts.  Your surgical cuts feel warm to the touch.  You have pus or a bad smell coming from your surgical cuts.  You have a fever.  One or more of your surgical cuts breaks open. Get help right away if:  You have trouble breathing.  You have chest pain.  You have pain that is getting worse in your shoulders.  You faint or feel dizzy when you stand.  You have very bad pain in your belly (abdomen).  You are sick to your stomach (nauseous) for more than one day.  You have throwing up (vomiting) that lasts for more than one day.  You have leg pain. This information is not intended to replace advice given  to you by your health care provider. Make sure you discuss any questions you have with your health care provider. Document Released: 01/20/2008 Document Revised: 11/01/2015 Document Reviewed: 09/29/2015 Elsevier Interactive Patient Education  2019 [employer].

## 2019-06-18 DIAGNOSIS — Z8673 Personal history of transient ischemic attack (TIA), and cerebral infarction without residual deficits: Secondary | ICD-10-CM | POA: Diagnosis not present

## 2019-06-18 DIAGNOSIS — Z48815 Encounter for surgical aftercare following surgery on the digestive system: Secondary | ICD-10-CM | POA: Diagnosis not present

## 2019-06-18 DIAGNOSIS — N183 Chronic kidney disease, stage 3 unspecified: Secondary | ICD-10-CM | POA: Diagnosis not present

## 2019-06-18 DIAGNOSIS — K8081 Other cholelithiasis with obstruction: Secondary | ICD-10-CM | POA: Diagnosis not present

## 2019-06-18 DIAGNOSIS — Z9181 History of falling: Secondary | ICD-10-CM | POA: Diagnosis not present

## 2019-06-18 DIAGNOSIS — G40909 Epilepsy, unspecified, not intractable, without status epilepticus: Secondary | ICD-10-CM | POA: Diagnosis not present

## 2019-06-18 DIAGNOSIS — E1122 Type 2 diabetes mellitus with diabetic chronic kidney disease: Secondary | ICD-10-CM | POA: Diagnosis not present

## 2019-06-18 DIAGNOSIS — E1159 Type 2 diabetes mellitus with other circulatory complications: Secondary | ICD-10-CM | POA: Diagnosis not present

## 2019-06-18 DIAGNOSIS — E785 Hyperlipidemia, unspecified: Secondary | ICD-10-CM | POA: Diagnosis not present

## 2019-06-18 DIAGNOSIS — Z8719 Personal history of other diseases of the digestive system: Secondary | ICD-10-CM | POA: Diagnosis not present

## 2019-06-18 DIAGNOSIS — E11649 Type 2 diabetes mellitus with hypoglycemia without coma: Secondary | ICD-10-CM | POA: Diagnosis not present

## 2019-06-18 DIAGNOSIS — G20 Parkinson's disease: Secondary | ICD-10-CM | POA: Diagnosis not present

## 2019-06-18 DIAGNOSIS — I129 Hypertensive chronic kidney disease with stage 1 through stage 4 chronic kidney disease, or unspecified chronic kidney disease: Secondary | ICD-10-CM | POA: Diagnosis not present

## 2019-06-20 ENCOUNTER — Telehealth: Payer: Self-pay | Admitting: Gastroenterology

## 2019-06-20 NOTE — Telephone Encounter (Signed)
[name]  had a stint placed in about week ago for his gallbladder & needs to know when it will be removed.

## 2019-06-22 ENCOUNTER — Telehealth: Payer: Self-pay | Admitting: Gastroenterology

## 2019-06-22 NOTE — Care Management (Signed)
Gilbert [name] with [facility] notified of discharge.  Patient start of care to be 06/18/19.

## 2019-06-22 NOTE — Telephone Encounter (Signed)
[name] called &l/m on v/m asking for a return call back to know when the stints from the ERCP needed to be removed.

## 2019-06-25 ENCOUNTER — Other Ambulatory Visit: Payer: Self-pay

## 2019-06-25 DIAGNOSIS — Z8673 Personal history of transient ischemic attack (TIA), and cerebral infarction without residual deficits: Secondary | ICD-10-CM | POA: Diagnosis not present

## 2019-06-25 DIAGNOSIS — I129 Hypertensive chronic kidney disease with stage 1 through stage 4 chronic kidney disease, or unspecified chronic kidney disease: Secondary | ICD-10-CM | POA: Diagnosis not present

## 2019-06-25 DIAGNOSIS — Z4582 Encounter for adjustment or removal of myringotomy device (stent) (tube): Secondary | ICD-10-CM

## 2019-06-25 DIAGNOSIS — G20 Parkinson's disease: Secondary | ICD-10-CM | POA: Diagnosis not present

## 2019-06-25 DIAGNOSIS — K8081 Other cholelithiasis with obstruction: Secondary | ICD-10-CM | POA: Diagnosis not present

## 2019-06-25 DIAGNOSIS — E11649 Type 2 diabetes mellitus with hypoglycemia without coma: Secondary | ICD-10-CM | POA: Diagnosis not present

## 2019-06-25 DIAGNOSIS — G40909 Epilepsy, unspecified, not intractable, without status epilepticus: Secondary | ICD-10-CM | POA: Diagnosis not present

## 2019-06-25 DIAGNOSIS — Z9181 History of falling: Secondary | ICD-10-CM | POA: Diagnosis not present

## 2019-06-25 DIAGNOSIS — E785 Hyperlipidemia, unspecified: Secondary | ICD-10-CM | POA: Diagnosis not present

## 2019-06-25 DIAGNOSIS — Z8719 Personal history of other diseases of the digestive system: Secondary | ICD-10-CM | POA: Diagnosis not present

## 2019-06-25 DIAGNOSIS — N183 Chronic kidney disease, stage 3 unspecified: Secondary | ICD-10-CM | POA: Diagnosis not present

## 2019-06-25 DIAGNOSIS — E1159 Type 2 diabetes mellitus with other circulatory complications: Secondary | ICD-10-CM | POA: Diagnosis not present

## 2019-06-25 DIAGNOSIS — E1122 Type 2 diabetes mellitus with diabetic chronic kidney disease: Secondary | ICD-10-CM | POA: Diagnosis not present

## 2019-06-25 DIAGNOSIS — Z48815 Encounter for surgical aftercare following surgery on the digestive system: Secondary | ICD-10-CM | POA: Diagnosis not present

## 2019-06-25 NOTE — Telephone Encounter (Signed)
Pt has been scheduled for an ERCP on 07/10/19. Instructions have been mailed.

## 2019-06-26 DIAGNOSIS — Z125 Encounter for screening for malignant neoplasm of prostate: Secondary | ICD-10-CM | POA: Diagnosis not present

## 2019-06-26 DIAGNOSIS — E1151 Type 2 diabetes mellitus with diabetic peripheral angiopathy without gangrene: Secondary | ICD-10-CM | POA: Diagnosis not present

## 2019-07-03 DIAGNOSIS — Z0000 Encounter for general adult medical examination without abnormal findings: Secondary | ICD-10-CM | POA: Diagnosis not present

## 2019-07-03 DIAGNOSIS — G40909 Epilepsy, unspecified, not intractable, without status epilepticus: Secondary | ICD-10-CM | POA: Diagnosis not present

## 2019-07-03 DIAGNOSIS — N1832 Chronic kidney disease, stage 3b: Secondary | ICD-10-CM | POA: Diagnosis not present

## 2019-07-03 DIAGNOSIS — I639 Cerebral infarction, unspecified: Secondary | ICD-10-CM | POA: Diagnosis not present

## 2019-07-03 DIAGNOSIS — G20 Parkinson's disease: Secondary | ICD-10-CM | POA: Diagnosis not present

## 2019-07-03 DIAGNOSIS — E1151 Type 2 diabetes mellitus with diabetic peripheral angiopathy without gangrene: Secondary | ICD-10-CM | POA: Diagnosis not present

## 2019-07-06 ENCOUNTER — Other Ambulatory Visit: Payer: Self-pay

## 2019-07-06 ENCOUNTER — Other Ambulatory Visit
Admission: RE | Admit: 2019-07-06 | Discharge: 2019-07-06 | Disposition: A | Discharge status: Home / Self Care | Payer: PPO | Source: Ambulatory Visit | Attending: Gastroenterology | Admitting: Gastroenterology

## 2019-07-06 DIAGNOSIS — Z20822 Contact with and (suspected) exposure to covid-19: Secondary | ICD-10-CM | POA: Diagnosis not present

## 2019-07-06 DIAGNOSIS — Z01812 Encounter for preprocedural laboratory examination: Secondary | ICD-10-CM | POA: Insufficient documentation

## 2019-07-07 LAB — SARS CORONAVIRUS 2 (TAT 6-24 HRS): SARS Coronavirus 2: NEGATIVE

## 2019-07-10 ENCOUNTER — Ambulatory Visit: Payer: PPO | Admitting: Certified Registered Nurse Anesthetist

## 2019-07-10 ENCOUNTER — Ambulatory Visit: Payer: PPO

## 2019-07-10 ENCOUNTER — Encounter: Payer: Self-pay | Admitting: Gastroenterology

## 2019-07-10 ENCOUNTER — Encounter
Admission: RE | Disposition: A | Discharge status: Home / Self Care | Payer: Self-pay | Source: Home / Self Care | Attending: Gastroenterology

## 2019-07-10 ENCOUNTER — Other Ambulatory Visit: Payer: Self-pay

## 2019-07-10 ENCOUNTER — Ambulatory Visit
Admission: RE | Admit: 2019-07-10 | Discharge: 2019-07-10 | Disposition: A | Discharge status: Home / Self Care | Payer: PPO | Attending: Gastroenterology | Admitting: Gastroenterology

## 2019-07-10 DIAGNOSIS — Z79899 Other long term (current) drug therapy: Secondary | ICD-10-CM | POA: Insufficient documentation

## 2019-07-10 DIAGNOSIS — G20 Parkinson's disease: Secondary | ICD-10-CM | POA: Diagnosis not present

## 2019-07-10 DIAGNOSIS — Z7982 Long term (current) use of aspirin: Secondary | ICD-10-CM | POA: Diagnosis not present

## 2019-07-10 DIAGNOSIS — I10 Essential (primary) hypertension: Secondary | ICD-10-CM | POA: Diagnosis not present

## 2019-07-10 DIAGNOSIS — Z8673 Personal history of transient ischemic attack (TIA), and cerebral infarction without residual deficits: Secondary | ICD-10-CM | POA: Diagnosis not present

## 2019-07-10 DIAGNOSIS — Z4659 Encounter for fitting and adjustment of other gastrointestinal appliance and device: Secondary | ICD-10-CM | POA: Diagnosis not present

## 2019-07-10 DIAGNOSIS — K8050 Calculus of bile duct without cholangitis or cholecystitis without obstruction: Secondary | ICD-10-CM

## 2019-07-10 DIAGNOSIS — Z4582 Encounter for adjustment or removal of myringotomy device (stent) (tube): Secondary | ICD-10-CM

## 2019-07-10 DIAGNOSIS — Z794 Long term (current) use of insulin: Secondary | ICD-10-CM | POA: Insufficient documentation

## 2019-07-10 DIAGNOSIS — Z79890 Hormone replacement therapy (postmenopausal): Secondary | ICD-10-CM | POA: Insufficient documentation

## 2019-07-10 DIAGNOSIS — E119 Type 2 diabetes mellitus without complications: Secondary | ICD-10-CM | POA: Diagnosis not present

## 2019-07-10 HISTORY — PX: ERCP: SHX5425

## 2019-07-10 LAB — GLUCOSE, CAPILLARY
Glucose-Capillary: 54 mg/dL — ABNORMAL LOW (ref 70–99)
Glucose-Capillary: 189 mg/dL — ABNORMAL HIGH (ref 70–99)
Glucose-Capillary: 38 mg/dL — CL (ref 70–99)
Glucose-Capillary: 107 mg/dL — ABNORMAL HIGH (ref 70–99)

## 2019-07-10 SURGERY — ERCP, WITH INTERVENTION IF INDICATED
Anesthesia: General

## 2019-07-10 MED ORDER — LACTATED RINGERS IV SOLN
INTRAVENOUS | Status: DC
  Administered 2019-07-10: 1000 mL via INTRAVENOUS

## 2019-07-10 MED ORDER — DEXTROSE 50 % IV SOLN: 1.0000 | Freq: Once | INTRAVENOUS | Status: DC

## 2019-07-10 MED ORDER — FENTANYL CITRATE (PF) 100 MCG/2ML IJ SOLN
INTRAMUSCULAR | Status: DC | PRN
  Administered 2019-07-10 (×2): 25 ug via INTRAVENOUS
  Administered 2019-07-10: 50 ug via INTRAVENOUS

## 2019-07-10 MED ORDER — EPHEDRINE SULFATE 50 MG/ML IJ SOLN
INTRAMUSCULAR | Status: DC | PRN
  Administered 2019-07-10 (×3): 5 mg via INTRAVENOUS

## 2019-07-10 MED ORDER — FENTANYL CITRATE (PF) 100 MCG/2ML IJ SOLN
INTRAMUSCULAR | Status: AC
  Filled 2019-07-10: qty 2

## 2019-07-10 MED ORDER — LIDOCAINE HCL (CARDIAC) PF 100 MG/5ML IV SOSY
PREFILLED_SYRINGE | INTRAVENOUS | Status: DC | PRN
  Administered 2019-07-10: 100 mg via INTRAVENOUS

## 2019-07-10 MED ORDER — ONDANSETRON HCL 4 MG/2ML IJ SOLN
INTRAMUSCULAR | Status: AC
  Filled 2019-07-10: qty 2

## 2019-07-10 MED ORDER — PROPOFOL 10 MG/ML IV BOLUS
INTRAVENOUS | Status: DC | PRN
  Administered 2019-07-10: 100 mg via INTRAVENOUS
  Administered 2019-07-10: 20 mg via INTRAVENOUS

## 2019-07-10 MED ORDER — ONDANSETRON HCL 4 MG/2ML IJ SOLN
INTRAMUSCULAR | Status: DC | PRN
  Administered 2019-07-10: 4 mg via INTRAVENOUS

## 2019-07-10 MED ORDER — DEXTROSE 50 % IV SOLN
INTRAVENOUS | Status: AC
  Filled 2019-07-10: qty 50

## 2019-07-10 MED ORDER — EPHEDRINE 5 MG/ML INJ
INTRAVENOUS | Status: AC
  Filled 2019-07-10: qty 10

## 2019-07-10 MED ORDER — DEXAMETHASONE SODIUM PHOSPHATE 10 MG/ML IJ SOLN
INTRAMUSCULAR | Status: DC | PRN
  Administered 2019-07-10: 10 mg via INTRAVENOUS

## 2019-07-10 MED ORDER — DEXTROSE 50 % IV SOLN
INTRAVENOUS | Status: AC
  Administered 2019-07-10: 25 mL via INTRAVENOUS
  Filled 2019-07-10: qty 50

## 2019-07-10 MED ORDER — DEXTROSE 50 % IV SOLN
25.0000 mL | Freq: Once | INTRAVENOUS | Status: AC
  Administered 2019-07-10: 25 mL via INTRAVENOUS

## 2019-07-10 MED ORDER — SODIUM CHLORIDE 0.9 % IV SOLN: INTRAVENOUS | Status: DC

## 2019-07-10 MED ORDER — GLYCOPYRROLATE 0.2 MG/ML IJ SOLN
INTRAMUSCULAR | Status: DC | PRN
  Administered 2019-07-10: .2 mg via INTRAVENOUS

## 2019-07-10 MED ORDER — PROPOFOL 10 MG/ML IV BOLUS
INTRAVENOUS | Status: AC
  Filled 2019-07-10: qty 20

## 2019-07-10 MED ORDER — SUCCINYLCHOLINE CHLORIDE 20 MG/ML IJ SOLN
INTRAMUSCULAR | Status: DC | PRN
  Administered 2019-07-10: 100 mg via INTRAVENOUS

## 2019-07-10 NOTE — Anesthesia Preprocedure Evaluation (Signed)
Anesthesia Evaluation  Patient identified by MRN, date of birth, ID band Patient awake    Reviewed: Allergy & Precautions, H&P , NPO status , Patient's Chart, lab work & pertinent test results  History of Anesthesia Complications Negative for: history of anesthetic complications  Airway Mallampati: III  TM Distance: >3 FB Neck ROM: limited    Dental no notable dental hx. (+) Chipped, Poor Dentition, Missing, Dental Advisory Given   Pulmonary neg pulmonary ROS, neg shortness of breath,    Pulmonary exam normal breath sounds clear to auscultation       Cardiovascular hypertension, Pt. on medications (-) angina(-) Past MI  Rhythm:Regular Rate:Normal - Systolic murmurs    Neuro/Psych Seizures -,  Parkinson disease CVA, No Residual Symptoms negative psych ROS   GI/Hepatic negative GI ROS, Neg liver ROS,   Endo/Other  diabetes, Type 2  Renal/GU      Musculoskeletal   Abdominal   Peds  Hematology negative hematology ROS (+)   Anesthesia Other Findings Past Medical History: No date: Diabetes mellitus without complication (HCC) No date: Hypertension No date: Parkinson disease ([place]) No date: Stroke [facility])  Past Surgical History: No date: ELBOW SURGERY 06/12/2019: ENDOSCOPIC RETROGRADE CHOLANGIOPANCREATOGRAPHY (ERCP) WITH  PROPOFOL; N/A     Comment:  Procedure: ENDOSCOPIC RETROGRADE               CHOLANGIOPANCREATOGRAPHY (ERCP) WITH PROPOFOL;  Surgeon:               Lucilla Lame, MD;  Location: [facility] ENDOSCOPY;  Service:               Endoscopy;  Laterality: N/A; No date: NO PAST SURGERIES  BMI    Body Mass Index: 27.27 kg/m      Reproductive/Obstetrics negative OB ROS                             Anesthesia Physical  Anesthesia Plan  ASA: III  Anesthesia Plan: General ETT   Post-op Pain Management:    Induction: Intravenous  PONV Risk Score and Plan: 2 and Ondansetron,  Dexamethasone and Treatment may vary due to age or medical condition  Airway Management Planned: Oral ETT and Video Laryngoscope Planned  Additional Equipment:   Intra-op Plan:   Post-operative Plan: Extubation in OR  Informed Consent: I have reviewed the patients History and Physical, chart, labs and discussed the procedure including the risks, benefits and alternatives for the proposed anesthesia with the patient or authorized representative who has indicated his/her understanding and acceptance.     Dental Advisory Given  Plan Discussed with: Anesthesiologist, CRNA and Surgeon  Anesthesia Plan Comments: (Patient consented for risks of anesthesia including but not limited to:  - adverse reactions to medications - damage to teeth, lips or other oral mucosa - sore throat or hoarseness - Damage to heart, brain, lungs or loss of life  Patient voiced understanding.)        Anesthesia Quick Evaluation

## 2019-07-10 NOTE — Op Note (Signed)
[facility] Gastroenterology Patient Name: Gilbert [name] Procedure Date: 07/10/2019 11:28 AM MRN: [id number] Account #: [id number] Date of Birth: [birth year] Admit Type: Outpatient Age: 79 Room: [facility] ENDO ROOM 4 Gender: Male Note Status: Finalized Procedure:             ERCP Indications:           Stent removal Providers:             Lucilla Lame MD, MD Referring MD:          [name], MD (Referring MD) Medicines:             General Anesthesia Complications:         No immediate complications. Procedure:             Pre-Anesthesia Assessment:                        - Prior to the procedure, a History and Physical was                         performed, and patient medications and allergies were                         reviewed. The patient's tolerance of previous                         anesthesia was also reviewed. The risks and benefits                         of the procedure and the sedation options and risks                         were discussed with the patient. All questions were                         answered, and informed consent was obtained. Prior                         Anticoagulants: The patient has taken no previous                         anticoagulant or antiplatelet agents. ASA Grade                         Assessment: II - A patient with mild systemic disease.                         After reviewing the risks and benefits, the patient                         was deemed in satisfactory condition to undergo the                         procedure.                        After obtaining informed consent, the scope was passed  under direct vision. Throughout the procedure, the                         patient's blood pressure, pulse, and oxygen                         saturations were monitored continuously. The                         Duodenoscope was introduced through the mouth, and                         used to  inject contrast into and used to inject                         contrast into the bile duct. The ERCP was accomplished                         without difficulty. The patient tolerated the                         procedure well. Findings:      A biliary stent was visible on the scout film. Two plastic stents       originating in the common bile duct were emerging from the major       papilla. The bile duct was deeply cannulated with the 15 mm balloon.       Contrast was injected. I personally interpreted the bile duct images.       There was brisk flow of contrast through the ducts. Image quality was       excellent. Contrast extended to the entire biliary tree. A wire was       passed into the biliary tree. The biliary tree was swept with a 15 mm       balloon starting at the bifurcation. One stone was removed. No stones       remained. Impression:            - Two stents from the common bile duct were seen in                         the major papilla.                        - Choledocholithiasis was found. Complete removal was                         accomplished by balloon extraction.                        - The biliary tree was swept. Recommendation:        - Discharge patient to home.                        - Resume previous diet.                        - Continue present medications. Procedure Code(s):     --- Professional ---  [id number], Endoscopic retrograde cholangiopancreatography                         (ERCP); with removal of calculi/debris from                         biliary/pancreatic duct(s)                        [phone], Endoscopic catheterization of the biliary                         ductal system, radiological supervision and                         interpretation Diagnosis Code(s):     --- Professional ---                        K80.50, Calculus of bile duct without cholangitis or                         cholecystitis without obstruction                         Z46.59, Encounter for fitting and adjustment of other                         gastrointestinal appliance and device CPT copyright 2019 American Medical Association. All rights reserved. The codes documented in this report are preliminary and upon coder review may  be revised to meet current compliance requirements. Lucilla Lame MD, MD 07/10/2019 12:31:45 PM This report has been signed electronically. Number of Addenda: 0 Note Initiated On: 07/10/2019 11:28 AM Estimated Blood Loss:  Estimated blood loss: none.      [facility]

## 2019-07-10 NOTE — H&P (Signed)
Lucilla Lame, MD [address] [place], [place] [address] Phone:[phone] Fax : [phone]  Primary Care Physician:  [name], MD Primary Gastroenterologist:  Dr. Allen Norris  Pre-Procedure History & Physical: HPI:  Gilbert Finner Sr. is a 79 y.o. male is here for an ERCP.   Past Medical History:  Diagnosis Date  . Choledocholithiasis   . Chronic cholecystitis   . Diabetes mellitus without complication ([place])   . Hypertension   . Parkinson disease ([place])   . Stroke [facility])     Past Surgical History:  Procedure Laterality Date  . ELBOW SURGERY    . ENDOSCOPIC RETROGRADE CHOLANGIOPANCREATOGRAPHY (ERCP) WITH PROPOFOL N/A 06/12/2019   Procedure: ENDOSCOPIC RETROGRADE CHOLANGIOPANCREATOGRAPHY (ERCP) WITH PROPOFOL;  Surgeon: Lucilla Lame, MD;  Location: [facility] ENDOSCOPY;  Service: Endoscopy;  Laterality: N/A;  . subtotal cholecystectomy  06/13/2019    Prior to Admission medications   Medication Sig Start Date End Date Taking? Authorizing Provider  amLODipine (NORVASC) 10 MG tablet Take 1 tablet (10 mg total) by mouth daily. 05/10/16  Yes Gladstone Lighter, MD  aspirin EC 81 MG EC tablet Take 1 tablet (81 mg total) by mouth daily. 05/10/16  Yes Gladstone Lighter, MD  atenolol (TENORMIN) 50 MG tablet Take 50 mg by mouth daily.   Yes [provider]  atorvastatin (LIPITOR) 80 MG tablet Take 40 mg by mouth at bedtime.    Yes [provider]  carbidopa-levodopa (SINEMET IR) 25-100 MG tablet Take 1 tablet by mouth 3 (three) times daily. 12/07/18  Yes [provider]  divalproex (DEPAKOTE ER) 500 MG 24 hr tablet Take 500 mg by mouth 2 (two) times daily.    Yes [provider]  donepezil (ARICEPT) 10 MG tablet Take 10 mg by mouth daily. 11/23/18  Yes [provider]  hydrALAZINE (APRESOLINE) 50 MG tablet Take 50 mg by mouth 3 (three) times daily.  09/28/18  Yes [provider]  insulin aspart protamine- aspart (NOVOLOG MIX  70/30) (70-30) 100 UNIT/ML injection Inject 20-45 Units into the skin See admin instructions. Inject 45u under the skin every morning and inject 20u under the skin every night   Yes [provider]  levothyroxine (SYNTHROID, LEVOTHROID) 75 MCG tablet Take 75 mcg by mouth daily before breakfast.   Yes [provider]  lisinopril (PRINIVIL,ZESTRIL) 40 MG tablet Take 1 tablet (40 mg total) by mouth at bedtime. 05/10/16  Yes Gladstone Lighter, MD  vitamin C (ASCORBIC ACID) 500 MG tablet Take 1,000 mg by mouth daily.   Yes [provider]    Allergies as of 06/25/2019  . (No Known Allergies)    Family History  Problem Relation Age of Onset  . Brain cancer Mother   . Prostate cancer Neg Hx   . Bladder Cancer Neg Hx   . Kidney cancer Neg Hx     Social History   Socioeconomic History  . Marital status: Married    Spouse name: Not on file  . Number of children: Not on file  . Years of education: Not on file  . Highest education level: Not on file  Occupational History  . Not on file  Tobacco Use  . Smoking status: Never Smoker  . Smokeless tobacco: Never Used  Substance and Sexual Activity  . Alcohol use: No  . Drug use: No  . Sexual activity: Not on file  Other Topics Concern  . Not on file  Social History Narrative  . Not on file  Social Determinants of Health   Financial Resource Strain:   . Difficulty of Paying Living Expenses:   Food Insecurity:   . Worried About Charity fundraiser in the Last Year:   . Arboriculturist in the Last Year:   Transportation Needs:   . Film/video editor (Medical):   Marland Kitchen Lack of Transportation (Non-Medical):   Physical Activity:   . Days of Exercise per Week:   . Minutes of Exercise per Session:   Stress:   . Feeling of Stress :   Social Connections:   . Frequency of Communication with Friends and Family:   . Frequency of Social Gatherings with Friends and Family:   . Attends Religious Services:   .  Active Member of Clubs or Organizations:   . Attends Archivist Meetings:   Marland Kitchen Marital Status:   Intimate Partner Violence:   . Fear of Current or Ex-Partner:   . Emotionally Abused:   Marland Kitchen Physically Abused:   . Sexually Abused:     Review of Systems: See HPI, otherwise negative ROS  Physical Exam: BP (!) 189/79   Pulse (!) 51   Temp (!) 96.6 F (35.9 C) (Tympanic)   Resp 18   Ht 5' 10.5" (1.791 m)   Wt 90.7 kg   SpO2 98%   BMI 28.29 kg/m  General:   Alert,  pleasant and cooperative in NAD Head:  Normocephalic and atraumatic. Neck:  Supple; no masses or thyromegaly. Lungs:  Clear throughout to auscultation.    Heart:  Regular rate and rhythm. Abdomen:  Soft, nontender and nondistended. Normal bowel sounds, without guarding, and without rebound.   Neurologic:  Alert and  oriented x4;  grossly normal neurologically.  Impression/Plan: Gilbert Finner Sr. is here for an ERCP to be performed for stent removal  Risks, benefits, limitations, and alternatives regarding  ERCP have been reviewed with the patient.  Questions have been answered.  All parties agreeable.   Lucilla Lame, MD  07/10/2019, 11:43 AM

## 2019-07-10 NOTE — Transfer of Care (Signed)
Immediate Anesthesia Transfer of Care Note  Patient: Gilbert Finner Sr.  Procedure(s) Performed: ENDOSCOPIC RETROGRADE CHOLANGIOPANCREATOGRAPHY (ERCP) STENT REMOVAL (N/A )  Patient Location: PACU  Anesthesia Type:General  Level of Consciousness: drowsy  Airway & Oxygen Therapy: Patient Spontanous Breathing and Patient connected to face mask oxygen  Post-op Assessment: Report given to RN and Post -op Vital signs reviewed and stable  Post vital signs: Reviewed and stable  Last Vitals:  Vitals Value Taken Time  BP 179/79 07/10/19 1244  Temp 36.4 C 07/10/19 1244  Pulse 75 07/10/19 1247  Resp 18 07/10/19 1247  SpO2 100 % 07/10/19 1247  Vitals shown include unvalidated device data.  Last Pain:  Vitals:   07/10/19 1244  TempSrc:   PainSc: Asleep         Complications: No apparent anesthesia complications

## 2019-07-10 NOTE — Anesthesia Procedure Notes (Signed)
Procedure Name: Intubation Date/Time: 07/10/2019 11:42 AM Performed by: Caryl Asp, CRNA Pre-anesthesia Checklist: Patient identified, Patient being monitored, Timeout performed, Emergency Drugs available and Suction available Patient Re-evaluated:Patient Re-evaluated prior to induction Oxygen Delivery Method: Circle system utilized Preoxygenation: Pre-oxygenation with 100% oxygen Induction Type: IV induction Ventilation: Mask ventilation without difficulty Laryngoscope Size: McGraph and 4 Grade View: Grade I Tube type: Oral Tube size: 7.5 mm Number of attempts: 1 Airway Equipment and Method: Stylet and Video-laryngoscopy Placement Confirmation: ETT inserted through vocal cords under direct vision,  positive ETCO2 and breath sounds checked- equal and bilateral Secured at: 22 cm Tube secured with: Tape Dental Injury: Teeth and Oropharynx as per pre-operative assessment

## 2019-07-10 NOTE — Progress Notes (Signed)
Notified Dr. Rosey Bath, blood sugar 38, order received for one amp of  D50.

## 2019-07-11 ENCOUNTER — Encounter: Payer: Self-pay | Admitting: *Deleted

## 2019-07-11 NOTE — Anesthesia Postprocedure Evaluation (Signed)
Anesthesia Post Note  Patient: [name]  Procedure(s) Performed: ENDOSCOPIC RETROGRADE CHOLANGIOPANCREATOGRAPHY (ERCP) STENT REMOVAL (N/A )  Patient location during evaluation: Endoscopy Anesthesia Type: General Level of consciousness: awake and alert Pain management: pain level controlled Vital Signs Assessment: post-procedure vital signs reviewed and stable Respiratory status: spontaneous breathing, nonlabored ventilation, respiratory function stable and patient connected to nasal cannula oxygen Cardiovascular status: blood pressure returned to baseline and stable Postop Assessment: no apparent nausea or vomiting Anesthetic complications: no     Last Vitals:  Vitals:   07/10/19 1322 07/10/19 1332  BP: (!) 173/79 (!) 132/97  Pulse:  78  Resp:  18  Temp:    SpO2:  93%    Last Pain:  Vitals:   07/11/19 0724  TempSrc:   PainSc: 0-No pain                 Arita Miss

## 2019-09-20 DIAGNOSIS — I6381 Other cerebral infarction due to occlusion or stenosis of small artery: Secondary | ICD-10-CM | POA: Diagnosis not present

## 2019-09-20 DIAGNOSIS — R4189 Other symptoms and signs involving cognitive functions and awareness: Secondary | ICD-10-CM | POA: Diagnosis not present

## 2019-09-20 DIAGNOSIS — R972 Elevated prostate specific antigen [PSA]: Secondary | ICD-10-CM | POA: Diagnosis not present

## 2019-09-20 DIAGNOSIS — N1832 Chronic kidney disease, stage 3b: Secondary | ICD-10-CM | POA: Diagnosis not present

## 2019-09-20 DIAGNOSIS — G20 Parkinson's disease: Secondary | ICD-10-CM | POA: Diagnosis not present

## 2019-09-20 DIAGNOSIS — G40909 Epilepsy, unspecified, not intractable, without status epilepticus: Secondary | ICD-10-CM | POA: Diagnosis not present

## 2019-12-26 DIAGNOSIS — G20 Parkinson's disease: Secondary | ICD-10-CM | POA: Diagnosis not present

## 2019-12-26 DIAGNOSIS — E1151 Type 2 diabetes mellitus with diabetic peripheral angiopathy without gangrene: Secondary | ICD-10-CM | POA: Diagnosis not present

## 2020-01-02 DIAGNOSIS — Z23 Encounter for immunization: Secondary | ICD-10-CM | POA: Diagnosis not present

## 2020-01-02 DIAGNOSIS — E1151 Type 2 diabetes mellitus with diabetic peripheral angiopathy without gangrene: Secondary | ICD-10-CM | POA: Diagnosis not present

## 2020-01-02 DIAGNOSIS — N1832 Chronic kidney disease, stage 3b: Secondary | ICD-10-CM | POA: Diagnosis not present

## 2020-01-02 DIAGNOSIS — Z125 Encounter for screening for malignant neoplasm of prostate: Secondary | ICD-10-CM | POA: Diagnosis not present

## 2020-01-02 DIAGNOSIS — I700 Atherosclerosis of aorta: Secondary | ICD-10-CM | POA: Diagnosis not present

## 2020-03-17 DIAGNOSIS — R4189 Other symptoms and signs involving cognitive functions and awareness: Secondary | ICD-10-CM | POA: Diagnosis not present

## 2020-03-17 DIAGNOSIS — G20 Parkinson's disease: Secondary | ICD-10-CM | POA: Diagnosis not present

## 2020-03-17 DIAGNOSIS — Z79899 Other long term (current) drug therapy: Secondary | ICD-10-CM | POA: Diagnosis not present

## 2020-03-17 DIAGNOSIS — G40909 Epilepsy, unspecified, not intractable, without status epilepticus: Secondary | ICD-10-CM | POA: Diagnosis not present

## 2020-04-28 DIAGNOSIS — I129 Hypertensive chronic kidney disease with stage 1 through stage 4 chronic kidney disease, or unspecified chronic kidney disease: Secondary | ICD-10-CM | POA: Diagnosis not present

## 2020-04-28 DIAGNOSIS — R4701 Aphasia: Secondary | ICD-10-CM | POA: Diagnosis not present

## 2020-04-28 DIAGNOSIS — Z8673 Personal history of transient ischemic attack (TIA), and cerebral infarction without residual deficits: Secondary | ICD-10-CM | POA: Diagnosis not present

## 2020-04-28 DIAGNOSIS — G8929 Other chronic pain: Secondary | ICD-10-CM | POA: Diagnosis not present

## 2020-04-28 DIAGNOSIS — H9193 Unspecified hearing loss, bilateral: Secondary | ICD-10-CM | POA: Diagnosis not present

## 2020-04-28 DIAGNOSIS — G40909 Epilepsy, unspecified, not intractable, without status epilepticus: Secondary | ICD-10-CM | POA: Diagnosis not present

## 2020-04-28 DIAGNOSIS — Z7982 Long term (current) use of aspirin: Secondary | ICD-10-CM | POA: Diagnosis not present

## 2020-04-28 DIAGNOSIS — E782 Mixed hyperlipidemia: Secondary | ICD-10-CM | POA: Diagnosis not present

## 2020-04-28 DIAGNOSIS — N183 Chronic kidney disease, stage 3 unspecified: Secondary | ICD-10-CM | POA: Diagnosis not present

## 2020-04-28 DIAGNOSIS — M545 Low back pain, unspecified: Secondary | ICD-10-CM | POA: Diagnosis not present

## 2020-04-28 DIAGNOSIS — E1122 Type 2 diabetes mellitus with diabetic chronic kidney disease: Secondary | ICD-10-CM | POA: Diagnosis not present

## 2020-04-28 DIAGNOSIS — G20 Parkinson's disease: Secondary | ICD-10-CM | POA: Diagnosis not present

## 2020-06-27 DIAGNOSIS — Z125 Encounter for screening for malignant neoplasm of prostate: Secondary | ICD-10-CM | POA: Diagnosis not present

## 2020-06-27 DIAGNOSIS — E785 Hyperlipidemia, unspecified: Secondary | ICD-10-CM | POA: Diagnosis not present

## 2020-06-27 DIAGNOSIS — E1151 Type 2 diabetes mellitus with diabetic peripheral angiopathy without gangrene: Secondary | ICD-10-CM | POA: Diagnosis not present

## 2020-07-04 DIAGNOSIS — E1151 Type 2 diabetes mellitus with diabetic peripheral angiopathy without gangrene: Secondary | ICD-10-CM | POA: Diagnosis not present

## 2020-07-04 DIAGNOSIS — F0390 Unspecified dementia without behavioral disturbance: Secondary | ICD-10-CM | POA: Diagnosis not present

## 2020-07-04 DIAGNOSIS — E1122 Type 2 diabetes mellitus with diabetic chronic kidney disease: Secondary | ICD-10-CM | POA: Diagnosis not present

## 2020-07-04 DIAGNOSIS — G8929 Other chronic pain: Secondary | ICD-10-CM | POA: Diagnosis not present

## 2020-07-04 DIAGNOSIS — N1832 Chronic kidney disease, stage 3b: Secondary | ICD-10-CM | POA: Diagnosis not present

## 2020-07-04 DIAGNOSIS — G20 Parkinson's disease: Secondary | ICD-10-CM | POA: Diagnosis not present

## 2020-07-04 DIAGNOSIS — I129 Hypertensive chronic kidney disease with stage 1 through stage 4 chronic kidney disease, or unspecified chronic kidney disease: Secondary | ICD-10-CM | POA: Diagnosis not present

## 2020-07-04 DIAGNOSIS — Z8673 Personal history of transient ischemic attack (TIA), and cerebral infarction without residual deficits: Secondary | ICD-10-CM | POA: Diagnosis not present

## 2020-07-04 DIAGNOSIS — R4701 Aphasia: Secondary | ICD-10-CM | POA: Diagnosis not present

## 2020-07-04 DIAGNOSIS — M109 Gout, unspecified: Secondary | ICD-10-CM | POA: Diagnosis not present

## 2020-07-04 DIAGNOSIS — Z0000 Encounter for general adult medical examination without abnormal findings: Secondary | ICD-10-CM | POA: Diagnosis not present

## 2020-07-04 DIAGNOSIS — I639 Cerebral infarction, unspecified: Secondary | ICD-10-CM | POA: Diagnosis not present

## 2020-07-04 DIAGNOSIS — Z9181 History of falling: Secondary | ICD-10-CM | POA: Diagnosis not present

## 2020-07-04 DIAGNOSIS — M545 Low back pain, unspecified: Secondary | ICD-10-CM | POA: Diagnosis not present

## 2020-07-04 DIAGNOSIS — G40909 Epilepsy, unspecified, not intractable, without status epilepticus: Secondary | ICD-10-CM | POA: Diagnosis not present

## 2020-07-04 DIAGNOSIS — I700 Atherosclerosis of aorta: Secondary | ICD-10-CM | POA: Diagnosis not present

## 2020-07-04 DIAGNOSIS — N183 Chronic kidney disease, stage 3 unspecified: Secondary | ICD-10-CM | POA: Diagnosis not present

## 2020-07-04 DIAGNOSIS — Z7982 Long term (current) use of aspirin: Secondary | ICD-10-CM | POA: Diagnosis not present

## 2020-07-04 DIAGNOSIS — I6529 Occlusion and stenosis of unspecified carotid artery: Secondary | ICD-10-CM | POA: Diagnosis not present

## 2020-07-04 DIAGNOSIS — E782 Mixed hyperlipidemia: Secondary | ICD-10-CM | POA: Diagnosis not present

## 2020-07-04 DIAGNOSIS — H9193 Unspecified hearing loss, bilateral: Secondary | ICD-10-CM | POA: Diagnosis not present

## 2020-07-10 DIAGNOSIS — Z9181 History of falling: Secondary | ICD-10-CM | POA: Diagnosis not present

## 2020-07-10 DIAGNOSIS — M545 Low back pain, unspecified: Secondary | ICD-10-CM | POA: Diagnosis not present

## 2020-07-10 DIAGNOSIS — Z7982 Long term (current) use of aspirin: Secondary | ICD-10-CM | POA: Diagnosis not present

## 2020-07-10 DIAGNOSIS — E1122 Type 2 diabetes mellitus with diabetic chronic kidney disease: Secondary | ICD-10-CM | POA: Diagnosis not present

## 2020-07-10 DIAGNOSIS — M109 Gout, unspecified: Secondary | ICD-10-CM | POA: Diagnosis not present

## 2020-07-10 DIAGNOSIS — I6529 Occlusion and stenosis of unspecified carotid artery: Secondary | ICD-10-CM | POA: Diagnosis not present

## 2020-07-10 DIAGNOSIS — I129 Hypertensive chronic kidney disease with stage 1 through stage 4 chronic kidney disease, or unspecified chronic kidney disease: Secondary | ICD-10-CM | POA: Diagnosis not present

## 2020-07-10 DIAGNOSIS — H9193 Unspecified hearing loss, bilateral: Secondary | ICD-10-CM | POA: Diagnosis not present

## 2020-07-10 DIAGNOSIS — G20 Parkinson's disease: Secondary | ICD-10-CM | POA: Diagnosis not present

## 2020-07-10 DIAGNOSIS — E782 Mixed hyperlipidemia: Secondary | ICD-10-CM | POA: Diagnosis not present

## 2020-07-10 DIAGNOSIS — G8929 Other chronic pain: Secondary | ICD-10-CM | POA: Diagnosis not present

## 2020-07-10 DIAGNOSIS — N183 Chronic kidney disease, stage 3 unspecified: Secondary | ICD-10-CM | POA: Diagnosis not present

## 2020-07-10 DIAGNOSIS — Z8673 Personal history of transient ischemic attack (TIA), and cerebral infarction without residual deficits: Secondary | ICD-10-CM | POA: Diagnosis not present

## 2020-07-10 DIAGNOSIS — G40909 Epilepsy, unspecified, not intractable, without status epilepticus: Secondary | ICD-10-CM | POA: Diagnosis not present

## 2020-07-10 DIAGNOSIS — R4701 Aphasia: Secondary | ICD-10-CM | POA: Diagnosis not present

## 2020-07-16 DIAGNOSIS — Z79899 Other long term (current) drug therapy: Secondary | ICD-10-CM | POA: Diagnosis not present

## 2020-07-16 DIAGNOSIS — R4189 Other symptoms and signs involving cognitive functions and awareness: Secondary | ICD-10-CM | POA: Diagnosis not present

## 2020-07-17 DIAGNOSIS — E119 Type 2 diabetes mellitus without complications: Secondary | ICD-10-CM | POA: Diagnosis not present

## 2020-07-20 IMAGING — DX PORTABLE CHEST - 1 VIEW
1 series · 1 of 1 positions shown · non-contrast
Comparison: None.

CLINICAL DATA: Fell last night with bruising on his back. Weakness.
Fever.

EXAM:
PORTABLE CHEST 1 VIEW

[chest ap]
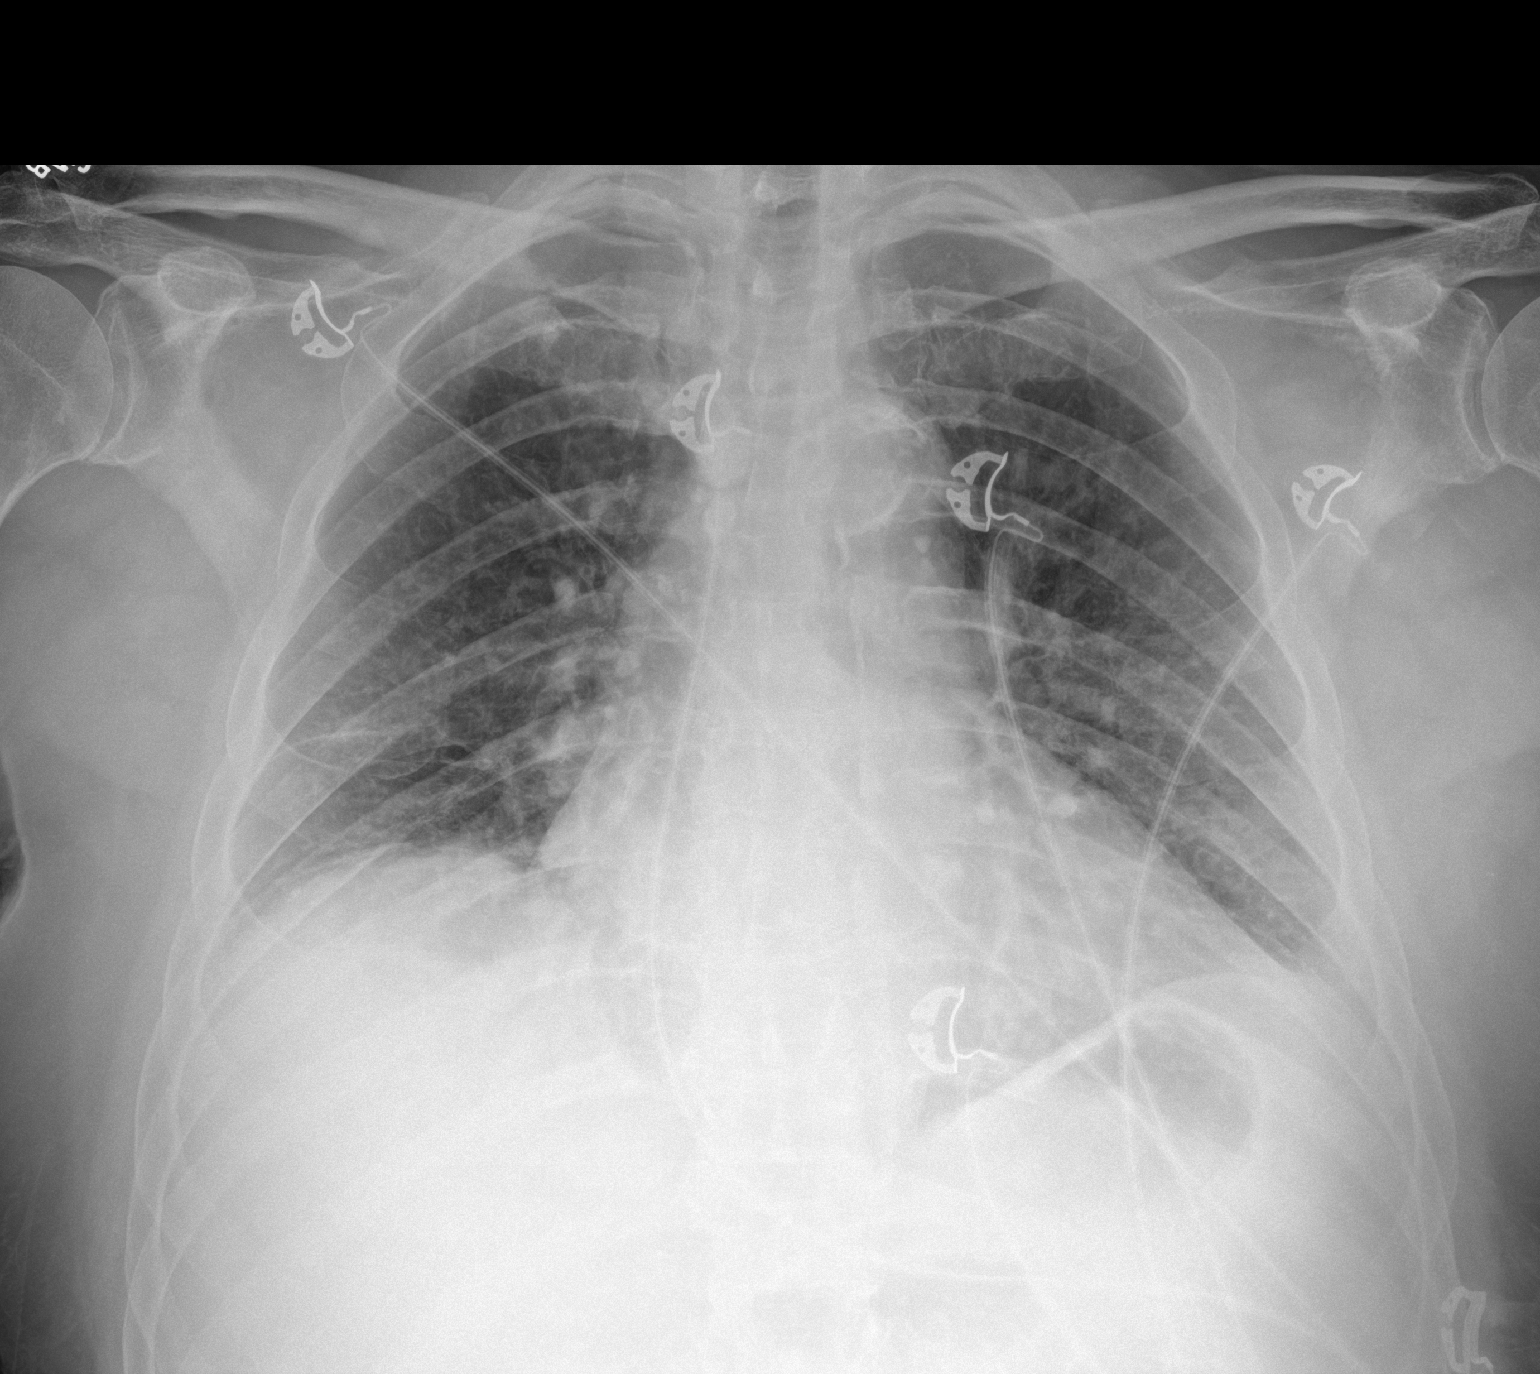

[1 of 1 positions shown; findings below may reference images not displayed]

FINDINGS: Enlarged cardiac silhouette. Mildly prominent pulmonary vasculature
and interstitial markings. No pleural fluid. No fracture or
pneumothorax seen. Tortuous and calcified thoracic aorta.
IMPRESSION: 1. Cardiomegaly and mild pulmonary vascular congestion.
2. Mild chronic interstitial lung disease.

## 2020-07-28 DIAGNOSIS — Z8673 Personal history of transient ischemic attack (TIA), and cerebral infarction without residual deficits: Secondary | ICD-10-CM | POA: Diagnosis not present

## 2020-07-28 DIAGNOSIS — R4701 Aphasia: Secondary | ICD-10-CM | POA: Diagnosis not present

## 2020-07-28 DIAGNOSIS — M109 Gout, unspecified: Secondary | ICD-10-CM | POA: Diagnosis not present

## 2020-07-28 DIAGNOSIS — I129 Hypertensive chronic kidney disease with stage 1 through stage 4 chronic kidney disease, or unspecified chronic kidney disease: Secondary | ICD-10-CM | POA: Diagnosis not present

## 2020-07-28 DIAGNOSIS — M545 Low back pain, unspecified: Secondary | ICD-10-CM | POA: Diagnosis not present

## 2020-07-28 DIAGNOSIS — N183 Chronic kidney disease, stage 3 unspecified: Secondary | ICD-10-CM | POA: Diagnosis not present

## 2020-07-28 DIAGNOSIS — G40909 Epilepsy, unspecified, not intractable, without status epilepticus: Secondary | ICD-10-CM | POA: Diagnosis not present

## 2020-07-28 DIAGNOSIS — H9193 Unspecified hearing loss, bilateral: Secondary | ICD-10-CM | POA: Diagnosis not present

## 2020-07-28 DIAGNOSIS — E1122 Type 2 diabetes mellitus with diabetic chronic kidney disease: Secondary | ICD-10-CM | POA: Diagnosis not present

## 2020-07-28 DIAGNOSIS — I6529 Occlusion and stenosis of unspecified carotid artery: Secondary | ICD-10-CM | POA: Diagnosis not present

## 2020-07-28 DIAGNOSIS — Z9181 History of falling: Secondary | ICD-10-CM | POA: Diagnosis not present

## 2020-07-28 DIAGNOSIS — G8929 Other chronic pain: Secondary | ICD-10-CM | POA: Diagnosis not present

## 2020-07-28 DIAGNOSIS — Z7982 Long term (current) use of aspirin: Secondary | ICD-10-CM | POA: Diagnosis not present

## 2020-07-28 DIAGNOSIS — G20 Parkinson's disease: Secondary | ICD-10-CM | POA: Diagnosis not present

## 2020-07-28 DIAGNOSIS — E782 Mixed hyperlipidemia: Secondary | ICD-10-CM | POA: Diagnosis not present

## 2020-08-14 DIAGNOSIS — Z8673 Personal history of transient ischemic attack (TIA), and cerebral infarction without residual deficits: Secondary | ICD-10-CM | POA: Diagnosis not present

## 2020-08-14 DIAGNOSIS — M545 Low back pain, unspecified: Secondary | ICD-10-CM | POA: Diagnosis not present

## 2020-08-14 DIAGNOSIS — E782 Mixed hyperlipidemia: Secondary | ICD-10-CM | POA: Diagnosis not present

## 2020-08-14 DIAGNOSIS — I129 Hypertensive chronic kidney disease with stage 1 through stage 4 chronic kidney disease, or unspecified chronic kidney disease: Secondary | ICD-10-CM | POA: Diagnosis not present

## 2020-08-14 DIAGNOSIS — Z9181 History of falling: Secondary | ICD-10-CM | POA: Diagnosis not present

## 2020-08-14 DIAGNOSIS — E1122 Type 2 diabetes mellitus with diabetic chronic kidney disease: Secondary | ICD-10-CM | POA: Diagnosis not present

## 2020-08-14 DIAGNOSIS — N183 Chronic kidney disease, stage 3 unspecified: Secondary | ICD-10-CM | POA: Diagnosis not present

## 2020-08-14 DIAGNOSIS — G40909 Epilepsy, unspecified, not intractable, without status epilepticus: Secondary | ICD-10-CM | POA: Diagnosis not present

## 2020-08-14 DIAGNOSIS — M109 Gout, unspecified: Secondary | ICD-10-CM | POA: Diagnosis not present

## 2020-08-14 DIAGNOSIS — R4701 Aphasia: Secondary | ICD-10-CM | POA: Diagnosis not present

## 2020-08-14 DIAGNOSIS — I6529 Occlusion and stenosis of unspecified carotid artery: Secondary | ICD-10-CM | POA: Diagnosis not present

## 2020-08-14 DIAGNOSIS — G20 Parkinson's disease: Secondary | ICD-10-CM | POA: Diagnosis not present

## 2020-08-14 DIAGNOSIS — H9193 Unspecified hearing loss, bilateral: Secondary | ICD-10-CM | POA: Diagnosis not present

## 2020-08-14 DIAGNOSIS — G8929 Other chronic pain: Secondary | ICD-10-CM | POA: Diagnosis not present

## 2020-08-14 DIAGNOSIS — Z7982 Long term (current) use of aspirin: Secondary | ICD-10-CM | POA: Diagnosis not present

## 2020-08-29 DIAGNOSIS — H9193 Unspecified hearing loss, bilateral: Secondary | ICD-10-CM | POA: Diagnosis not present

## 2020-08-29 DIAGNOSIS — N183 Chronic kidney disease, stage 3 unspecified: Secondary | ICD-10-CM | POA: Diagnosis not present

## 2020-08-29 DIAGNOSIS — G8929 Other chronic pain: Secondary | ICD-10-CM | POA: Diagnosis not present

## 2020-08-29 DIAGNOSIS — I6529 Occlusion and stenosis of unspecified carotid artery: Secondary | ICD-10-CM | POA: Diagnosis not present

## 2020-08-29 DIAGNOSIS — E782 Mixed hyperlipidemia: Secondary | ICD-10-CM | POA: Diagnosis not present

## 2020-08-29 DIAGNOSIS — Z7982 Long term (current) use of aspirin: Secondary | ICD-10-CM | POA: Diagnosis not present

## 2020-08-29 DIAGNOSIS — E1122 Type 2 diabetes mellitus with diabetic chronic kidney disease: Secondary | ICD-10-CM | POA: Diagnosis not present

## 2020-08-29 DIAGNOSIS — M545 Low back pain, unspecified: Secondary | ICD-10-CM | POA: Diagnosis not present

## 2020-08-29 DIAGNOSIS — G40909 Epilepsy, unspecified, not intractable, without status epilepticus: Secondary | ICD-10-CM | POA: Diagnosis not present

## 2020-08-29 DIAGNOSIS — I129 Hypertensive chronic kidney disease with stage 1 through stage 4 chronic kidney disease, or unspecified chronic kidney disease: Secondary | ICD-10-CM | POA: Diagnosis not present

## 2020-08-29 DIAGNOSIS — Z8673 Personal history of transient ischemic attack (TIA), and cerebral infarction without residual deficits: Secondary | ICD-10-CM | POA: Diagnosis not present

## 2020-08-29 DIAGNOSIS — G20 Parkinson's disease: Secondary | ICD-10-CM | POA: Diagnosis not present

## 2020-08-29 DIAGNOSIS — Z9181 History of falling: Secondary | ICD-10-CM | POA: Diagnosis not present

## 2020-08-29 DIAGNOSIS — R4701 Aphasia: Secondary | ICD-10-CM | POA: Diagnosis not present

## 2020-08-29 DIAGNOSIS — M109 Gout, unspecified: Secondary | ICD-10-CM | POA: Diagnosis not present

## 2020-09-02 DIAGNOSIS — U071 COVID-19: Secondary | ICD-10-CM | POA: Diagnosis not present

## 2020-09-02 DIAGNOSIS — J208 Acute bronchitis due to other specified organisms: Secondary | ICD-10-CM | POA: Diagnosis not present

## 2020-09-02 DIAGNOSIS — E1151 Type 2 diabetes mellitus with diabetic peripheral angiopathy without gangrene: Secondary | ICD-10-CM | POA: Diagnosis not present

## 2020-09-09 DIAGNOSIS — I6529 Occlusion and stenosis of unspecified carotid artery: Secondary | ICD-10-CM | POA: Diagnosis not present

## 2020-09-09 DIAGNOSIS — R4701 Aphasia: Secondary | ICD-10-CM | POA: Diagnosis not present

## 2020-09-09 DIAGNOSIS — I129 Hypertensive chronic kidney disease with stage 1 through stage 4 chronic kidney disease, or unspecified chronic kidney disease: Secondary | ICD-10-CM | POA: Diagnosis not present

## 2020-09-09 DIAGNOSIS — M109 Gout, unspecified: Secondary | ICD-10-CM | POA: Diagnosis not present

## 2020-09-09 DIAGNOSIS — Z7982 Long term (current) use of aspirin: Secondary | ICD-10-CM | POA: Diagnosis not present

## 2020-09-09 DIAGNOSIS — G8929 Other chronic pain: Secondary | ICD-10-CM | POA: Diagnosis not present

## 2020-09-09 DIAGNOSIS — G20 Parkinson's disease: Secondary | ICD-10-CM | POA: Diagnosis not present

## 2020-09-09 DIAGNOSIS — M545 Low back pain, unspecified: Secondary | ICD-10-CM | POA: Diagnosis not present

## 2020-09-09 DIAGNOSIS — N183 Chronic kidney disease, stage 3 unspecified: Secondary | ICD-10-CM | POA: Diagnosis not present

## 2020-09-09 DIAGNOSIS — E1122 Type 2 diabetes mellitus with diabetic chronic kidney disease: Secondary | ICD-10-CM | POA: Diagnosis not present

## 2020-09-09 DIAGNOSIS — Z9181 History of falling: Secondary | ICD-10-CM | POA: Diagnosis not present

## 2020-09-09 DIAGNOSIS — E782 Mixed hyperlipidemia: Secondary | ICD-10-CM | POA: Diagnosis not present

## 2020-09-09 DIAGNOSIS — H9193 Unspecified hearing loss, bilateral: Secondary | ICD-10-CM | POA: Diagnosis not present

## 2020-09-09 DIAGNOSIS — Z8673 Personal history of transient ischemic attack (TIA), and cerebral infarction without residual deficits: Secondary | ICD-10-CM | POA: Diagnosis not present

## 2020-09-09 DIAGNOSIS — G40909 Epilepsy, unspecified, not intractable, without status epilepticus: Secondary | ICD-10-CM | POA: Diagnosis not present

## 2020-10-06 DIAGNOSIS — Z8673 Personal history of transient ischemic attack (TIA), and cerebral infarction without residual deficits: Secondary | ICD-10-CM | POA: Diagnosis not present

## 2020-10-06 DIAGNOSIS — I6529 Occlusion and stenosis of unspecified carotid artery: Secondary | ICD-10-CM | POA: Diagnosis not present

## 2020-10-06 DIAGNOSIS — Z7982 Long term (current) use of aspirin: Secondary | ICD-10-CM | POA: Diagnosis not present

## 2020-10-06 DIAGNOSIS — E782 Mixed hyperlipidemia: Secondary | ICD-10-CM | POA: Diagnosis not present

## 2020-10-06 DIAGNOSIS — R4701 Aphasia: Secondary | ICD-10-CM | POA: Diagnosis not present

## 2020-10-06 DIAGNOSIS — G8929 Other chronic pain: Secondary | ICD-10-CM | POA: Diagnosis not present

## 2020-10-06 DIAGNOSIS — E1122 Type 2 diabetes mellitus with diabetic chronic kidney disease: Secondary | ICD-10-CM | POA: Diagnosis not present

## 2020-10-06 DIAGNOSIS — G20 Parkinson's disease: Secondary | ICD-10-CM | POA: Diagnosis not present

## 2020-10-06 DIAGNOSIS — M545 Low back pain, unspecified: Secondary | ICD-10-CM | POA: Diagnosis not present

## 2020-10-06 DIAGNOSIS — G40909 Epilepsy, unspecified, not intractable, without status epilepticus: Secondary | ICD-10-CM | POA: Diagnosis not present

## 2020-10-06 DIAGNOSIS — H9193 Unspecified hearing loss, bilateral: Secondary | ICD-10-CM | POA: Diagnosis not present

## 2020-10-06 DIAGNOSIS — N183 Chronic kidney disease, stage 3 unspecified: Secondary | ICD-10-CM | POA: Diagnosis not present

## 2020-10-06 DIAGNOSIS — I129 Hypertensive chronic kidney disease with stage 1 through stage 4 chronic kidney disease, or unspecified chronic kidney disease: Secondary | ICD-10-CM | POA: Diagnosis not present

## 2020-10-06 DIAGNOSIS — Z9181 History of falling: Secondary | ICD-10-CM | POA: Diagnosis not present

## 2020-10-06 DIAGNOSIS — M109 Gout, unspecified: Secondary | ICD-10-CM | POA: Diagnosis not present

## 2020-10-20 DIAGNOSIS — I129 Hypertensive chronic kidney disease with stage 1 through stage 4 chronic kidney disease, or unspecified chronic kidney disease: Secondary | ICD-10-CM | POA: Diagnosis not present

## 2020-10-20 DIAGNOSIS — G40909 Epilepsy, unspecified, not intractable, without status epilepticus: Secondary | ICD-10-CM | POA: Diagnosis not present

## 2020-10-20 DIAGNOSIS — H9193 Unspecified hearing loss, bilateral: Secondary | ICD-10-CM | POA: Diagnosis not present

## 2020-10-20 DIAGNOSIS — E1122 Type 2 diabetes mellitus with diabetic chronic kidney disease: Secondary | ICD-10-CM | POA: Diagnosis not present

## 2020-10-20 DIAGNOSIS — G20 Parkinson's disease: Secondary | ICD-10-CM | POA: Diagnosis not present

## 2020-10-20 DIAGNOSIS — G8929 Other chronic pain: Secondary | ICD-10-CM | POA: Diagnosis not present

## 2020-10-20 DIAGNOSIS — M109 Gout, unspecified: Secondary | ICD-10-CM | POA: Diagnosis not present

## 2020-10-20 DIAGNOSIS — Z8673 Personal history of transient ischemic attack (TIA), and cerebral infarction without residual deficits: Secondary | ICD-10-CM | POA: Diagnosis not present

## 2020-10-20 DIAGNOSIS — M545 Low back pain, unspecified: Secondary | ICD-10-CM | POA: Diagnosis not present

## 2020-10-20 DIAGNOSIS — Z7982 Long term (current) use of aspirin: Secondary | ICD-10-CM | POA: Diagnosis not present

## 2020-10-20 DIAGNOSIS — E782 Mixed hyperlipidemia: Secondary | ICD-10-CM | POA: Diagnosis not present

## 2020-10-20 DIAGNOSIS — R4701 Aphasia: Secondary | ICD-10-CM | POA: Diagnosis not present

## 2020-10-20 DIAGNOSIS — N183 Chronic kidney disease, stage 3 unspecified: Secondary | ICD-10-CM | POA: Diagnosis not present

## 2020-10-20 DIAGNOSIS — Z9181 History of falling: Secondary | ICD-10-CM | POA: Diagnosis not present

## 2020-10-20 DIAGNOSIS — I6529 Occlusion and stenosis of unspecified carotid artery: Secondary | ICD-10-CM | POA: Diagnosis not present

## 2020-10-29 DIAGNOSIS — G8929 Other chronic pain: Secondary | ICD-10-CM | POA: Diagnosis not present

## 2020-10-29 DIAGNOSIS — Z7982 Long term (current) use of aspirin: Secondary | ICD-10-CM | POA: Diagnosis not present

## 2020-10-29 DIAGNOSIS — M545 Low back pain, unspecified: Secondary | ICD-10-CM | POA: Diagnosis not present

## 2020-10-29 DIAGNOSIS — M109 Gout, unspecified: Secondary | ICD-10-CM | POA: Diagnosis not present

## 2020-10-29 DIAGNOSIS — R4701 Aphasia: Secondary | ICD-10-CM | POA: Diagnosis not present

## 2020-10-29 DIAGNOSIS — I129 Hypertensive chronic kidney disease with stage 1 through stage 4 chronic kidney disease, or unspecified chronic kidney disease: Secondary | ICD-10-CM | POA: Diagnosis not present

## 2020-10-29 DIAGNOSIS — E1122 Type 2 diabetes mellitus with diabetic chronic kidney disease: Secondary | ICD-10-CM | POA: Diagnosis not present

## 2020-10-29 DIAGNOSIS — N183 Chronic kidney disease, stage 3 unspecified: Secondary | ICD-10-CM | POA: Diagnosis not present

## 2020-10-29 DIAGNOSIS — Z8673 Personal history of transient ischemic attack (TIA), and cerebral infarction without residual deficits: Secondary | ICD-10-CM | POA: Diagnosis not present

## 2020-10-29 DIAGNOSIS — I6529 Occlusion and stenosis of unspecified carotid artery: Secondary | ICD-10-CM | POA: Diagnosis not present

## 2020-10-29 DIAGNOSIS — G20 Parkinson's disease: Secondary | ICD-10-CM | POA: Diagnosis not present

## 2020-10-29 DIAGNOSIS — Z9181 History of falling: Secondary | ICD-10-CM | POA: Diagnosis not present

## 2020-10-29 DIAGNOSIS — H9193 Unspecified hearing loss, bilateral: Secondary | ICD-10-CM | POA: Diagnosis not present

## 2020-10-29 DIAGNOSIS — G40909 Epilepsy, unspecified, not intractable, without status epilepticus: Secondary | ICD-10-CM | POA: Diagnosis not present

## 2020-10-29 DIAGNOSIS — E782 Mixed hyperlipidemia: Secondary | ICD-10-CM | POA: Diagnosis not present

## 2020-11-17 DIAGNOSIS — R4189 Other symptoms and signs involving cognitive functions and awareness: Secondary | ICD-10-CM | POA: Diagnosis not present

## 2020-11-17 DIAGNOSIS — G20 Parkinson's disease: Secondary | ICD-10-CM | POA: Diagnosis not present

## 2020-11-17 DIAGNOSIS — G40909 Epilepsy, unspecified, not intractable, without status epilepticus: Secondary | ICD-10-CM | POA: Diagnosis not present

## 2020-11-17 DIAGNOSIS — Z8673 Personal history of transient ischemic attack (TIA), and cerebral infarction without residual deficits: Secondary | ICD-10-CM | POA: Diagnosis not present

## 2020-12-31 DIAGNOSIS — E785 Hyperlipidemia, unspecified: Secondary | ICD-10-CM | POA: Diagnosis not present

## 2020-12-31 DIAGNOSIS — E1151 Type 2 diabetes mellitus with diabetic peripheral angiopathy without gangrene: Secondary | ICD-10-CM | POA: Diagnosis not present

## 2021-01-05 DIAGNOSIS — G40909 Epilepsy, unspecified, not intractable, without status epilepticus: Secondary | ICD-10-CM | POA: Diagnosis not present

## 2021-01-05 DIAGNOSIS — G20 Parkinson's disease: Secondary | ICD-10-CM | POA: Diagnosis not present

## 2021-01-05 DIAGNOSIS — E1151 Type 2 diabetes mellitus with diabetic peripheral angiopathy without gangrene: Secondary | ICD-10-CM | POA: Diagnosis not present

## 2021-01-05 DIAGNOSIS — Z125 Encounter for screening for malignant neoplasm of prostate: Secondary | ICD-10-CM | POA: Diagnosis not present

## 2021-04-20 ENCOUNTER — Inpatient Hospital Stay
Admission: EM | Admit: 2021-04-20 | Discharge: 2021-04-22 | DRG: 189 | Disposition: A | Discharge status: Home Health | Payer: HMO | Attending: Student in an Organized Health Care Education/Training Program | Admitting: Student in an Organized Health Care Education/Training Program

## 2021-04-20 ENCOUNTER — Emergency Department: Payer: HMO

## 2021-04-20 ENCOUNTER — Other Ambulatory Visit: Payer: Self-pay

## 2021-04-20 ENCOUNTER — Encounter: Payer: Self-pay | Admitting: Emergency Medicine

## 2021-04-20 DIAGNOSIS — G20A1 Parkinson's disease without dyskinesia, without mention of fluctuations: Secondary | ICD-10-CM

## 2021-04-20 DIAGNOSIS — J9811 Atelectasis: Secondary | ICD-10-CM | POA: Diagnosis present

## 2021-04-20 DIAGNOSIS — Z7982 Long term (current) use of aspirin: Secondary | ICD-10-CM | POA: Diagnosis not present

## 2021-04-20 DIAGNOSIS — I517 Cardiomegaly: Secondary | ICD-10-CM | POA: Diagnosis not present

## 2021-04-20 DIAGNOSIS — M25511 Pain in right shoulder: Secondary | ICD-10-CM | POA: Diagnosis present

## 2021-04-20 DIAGNOSIS — W19XXXA Unspecified fall, initial encounter: Secondary | ICD-10-CM | POA: Diagnosis present

## 2021-04-20 DIAGNOSIS — R0902 Hypoxemia: Secondary | ICD-10-CM | POA: Diagnosis not present

## 2021-04-20 DIAGNOSIS — I5032 Chronic diastolic (congestive) heart failure: Secondary | ICD-10-CM | POA: Diagnosis present

## 2021-04-20 DIAGNOSIS — I130 Hypertensive heart and chronic kidney disease with heart failure and stage 1 through stage 4 chronic kidney disease, or unspecified chronic kidney disease: Secondary | ICD-10-CM | POA: Diagnosis present

## 2021-04-20 DIAGNOSIS — Z8673 Personal history of transient ischemic attack (TIA), and cerebral infarction without residual deficits: Secondary | ICD-10-CM

## 2021-04-20 DIAGNOSIS — E1122 Type 2 diabetes mellitus with diabetic chronic kidney disease: Secondary | ICD-10-CM | POA: Diagnosis present

## 2021-04-20 DIAGNOSIS — M549 Dorsalgia, unspecified: Secondary | ICD-10-CM | POA: Diagnosis not present

## 2021-04-20 DIAGNOSIS — E039 Hypothyroidism, unspecified: Secondary | ICD-10-CM | POA: Diagnosis present

## 2021-04-20 DIAGNOSIS — Z794 Long term (current) use of insulin: Secondary | ICD-10-CM | POA: Diagnosis not present

## 2021-04-20 DIAGNOSIS — Z20822 Contact with and (suspected) exposure to covid-19: Secondary | ICD-10-CM | POA: Diagnosis present

## 2021-04-20 DIAGNOSIS — I10 Essential (primary) hypertension: Secondary | ICD-10-CM | POA: Diagnosis present

## 2021-04-20 DIAGNOSIS — Z808 Family history of malignant neoplasm of other organs or systems: Secondary | ICD-10-CM

## 2021-04-20 DIAGNOSIS — E785 Hyperlipidemia, unspecified: Secondary | ICD-10-CM | POA: Diagnosis present

## 2021-04-20 DIAGNOSIS — W108XXA Fall (on) (from) other stairs and steps, initial encounter: Secondary | ICD-10-CM | POA: Diagnosis present

## 2021-04-20 DIAGNOSIS — I639 Cerebral infarction, unspecified: Secondary | ICD-10-CM | POA: Diagnosis present

## 2021-04-20 DIAGNOSIS — G20 Parkinson's disease: Secondary | ICD-10-CM | POA: Diagnosis present

## 2021-04-20 DIAGNOSIS — G40909 Epilepsy, unspecified, not intractable, without status epilepticus: Secondary | ICD-10-CM

## 2021-04-20 DIAGNOSIS — S2231XA Fracture of one rib, right side, initial encounter for closed fracture: Secondary | ICD-10-CM | POA: Diagnosis present

## 2021-04-20 DIAGNOSIS — R778 Other specified abnormalities of plasma proteins: Secondary | ICD-10-CM | POA: Diagnosis present

## 2021-04-20 DIAGNOSIS — N183 Chronic kidney disease, stage 3 unspecified: Secondary | ICD-10-CM | POA: Diagnosis not present

## 2021-04-20 DIAGNOSIS — E1121 Type 2 diabetes mellitus with diabetic nephropathy: Secondary | ICD-10-CM | POA: Diagnosis not present

## 2021-04-20 DIAGNOSIS — Z79890 Hormone replacement therapy (postmenopausal): Secondary | ICD-10-CM

## 2021-04-20 DIAGNOSIS — R0781 Pleurodynia: Secondary | ICD-10-CM | POA: Diagnosis present

## 2021-04-20 DIAGNOSIS — R7989 Other specified abnormal findings of blood chemistry: Secondary | ICD-10-CM | POA: Diagnosis present

## 2021-04-20 DIAGNOSIS — J8410 Pulmonary fibrosis, unspecified: Secondary | ICD-10-CM | POA: Diagnosis not present

## 2021-04-20 DIAGNOSIS — Z79899 Other long term (current) drug therapy: Secondary | ICD-10-CM

## 2021-04-20 DIAGNOSIS — Y9301 Activity, walking, marching and hiking: Secondary | ICD-10-CM | POA: Diagnosis present

## 2021-04-20 DIAGNOSIS — Y92018 Other place in single-family (private) house as the place of occurrence of the external cause: Secondary | ICD-10-CM

## 2021-04-20 DIAGNOSIS — R079 Chest pain, unspecified: Secondary | ICD-10-CM | POA: Diagnosis not present

## 2021-04-20 DIAGNOSIS — N1831 Chronic kidney disease, stage 3a: Secondary | ICD-10-CM | POA: Diagnosis present

## 2021-04-20 DIAGNOSIS — R52 Pain, unspecified: Secondary | ICD-10-CM | POA: Diagnosis not present

## 2021-04-20 DIAGNOSIS — E038 Other specified hypothyroidism: Secondary | ICD-10-CM | POA: Diagnosis not present

## 2021-04-20 DIAGNOSIS — J9601 Acute respiratory failure with hypoxia: Secondary | ICD-10-CM | POA: Diagnosis not present

## 2021-04-20 DIAGNOSIS — E1129 Type 2 diabetes mellitus with other diabetic kidney complication: Secondary | ICD-10-CM | POA: Diagnosis present

## 2021-04-20 LAB — CBC WITH DIFFERENTIAL/PLATELET
Abs Immature Granulocytes: 0.04 10*3/uL (ref 0.00–0.07)
Basophils Absolute: 0 10*3/uL (ref 0.0–0.1)
Basophils Relative: 0 %
Eosinophils Absolute: 0 10*3/uL (ref 0.0–0.5)
Eosinophils Relative: 0 %
HCT: 39.9 % (ref 39.0–52.0)
Hemoglobin: 13 g/dL (ref 13.0–17.0)
Immature Granulocytes: 1 %
Lymphocytes Relative: 16 %
Lymphs Abs: 1.4 10*3/uL (ref 0.7–4.0)
MCH: 31.1 pg (ref 26.0–34.0)
MCHC: 32.6 g/dL (ref 30.0–36.0)
MCV: 95.5 fL (ref 80.0–100.0)
Monocytes Absolute: 0.6 10*3/uL (ref 0.1–1.0)
Monocytes Relative: 7 %
Neutro Abs: 6.7 10*3/uL (ref 1.7–7.7)
Neutrophils Relative %: 76 %
Platelets: 137 10*3/uL — ABNORMAL LOW (ref 150–400)
RBC: 4.18 MIL/uL — ABNORMAL LOW (ref 4.22–5.81)
RDW: 13.1 % (ref 11.5–15.5)
WBC: 8.8 10*3/uL (ref 4.0–10.5)
nRBC: 0 % (ref 0.0–0.2)

## 2021-04-20 LAB — COMPREHENSIVE METABOLIC PANEL
ALT: 7 U/L (ref 0–44)
AST: 23 U/L (ref 15–41)
Albumin: 3.4 g/dL — ABNORMAL LOW (ref 3.5–5.0)
Alkaline Phosphatase: 83 U/L (ref 38–126)
Anion gap: 6 (ref 5–15)
BUN: 21 mg/dL (ref 8–23)
CO2: 29 mmol/L (ref 22–32)
Calcium: 8.2 mg/dL — ABNORMAL LOW (ref 8.9–10.3)
Chloride: 101 mmol/L (ref 98–111)
Creatinine, Ser: 1.36 mg/dL — ABNORMAL HIGH (ref 0.61–1.24)
GFR, Estimated: 52 mL/min — ABNORMAL LOW (ref >60)
Glucose, Bld: 257 mg/dL — ABNORMAL HIGH (ref 70–99)
Potassium: 4.5 mmol/L (ref 3.5–5.1)
Sodium: 136 mmol/L (ref 135–145)
Total Bilirubin: 0.9 mg/dL (ref 0.3–1.2)
Total Protein: 7.3 g/dL (ref 6.5–8.1)

## 2021-04-20 LAB — BRAIN NATRIURETIC PEPTIDE: B Natriuretic Peptide: 812.4 pg/mL — ABNORMAL HIGH (ref 0.0–100.0)

## 2021-04-20 LAB — TROPONIN I (HIGH SENSITIVITY)
Troponin I (High Sensitivity): 36 ng/L — ABNORMAL HIGH (ref <18)
Troponin I (High Sensitivity): 40 ng/L — ABNORMAL HIGH (ref <18)
Troponin I (High Sensitivity): 41 ng/L — ABNORMAL HIGH (ref <18)

## 2021-04-20 LAB — RESP PANEL BY RT-PCR (FLU A&B, COVID) ARPGX2
Influenza A by PCR: NEGATIVE
Influenza B by PCR: NEGATIVE
SARS Coronavirus 2 by RT PCR: NEGATIVE

## 2021-04-20 LAB — GLUCOSE, CAPILLARY: Glucose-Capillary: 208 mg/dL — ABNORMAL HIGH (ref 70–99)

## 2021-04-20 LAB — CBG MONITORING, ED: Glucose-Capillary: 292 mg/dL — ABNORMAL HIGH (ref 70–99)

## 2021-04-20 MED ORDER — IOHEXOL 350 MG/ML SOLN
75.0000 mL | Freq: Once | INTRAVENOUS | Status: AC | PRN
  Administered 2021-04-20: 12:00:00 75 mL via INTRAVENOUS
  Filled 2021-04-20: qty 75

## 2021-04-20 MED ORDER — INSULIN ASPART 100 UNIT/ML IJ SOLN
0.0000 [IU] | Freq: Three times a day (TID) | INTRAMUSCULAR | Status: DC
  Administered 2021-04-20: 19:00:00 5 [IU] via SUBCUTANEOUS
  Administered 2021-04-21: 08:00:00 2 [IU] via SUBCUTANEOUS
  Administered 2021-04-21 (×2): 3 [IU] via SUBCUTANEOUS
  Filled 2021-04-20 (×5): qty 1

## 2021-04-20 MED ORDER — LEVOTHYROXINE SODIUM 50 MCG PO TABS
75.0000 ug | ORAL_TABLET | Freq: Every day | ORAL | Status: DC
  Administered 2021-04-21 – 2021-04-22 (×2): 75 ug via ORAL
  Filled 2021-04-20 (×2): qty 1

## 2021-04-20 MED ORDER — OXYCODONE-ACETAMINOPHEN 5-325 MG PO TABS
1.0000 | ORAL_TABLET | ORAL | Status: DC | PRN
  Administered 2021-04-20: 22:00:00 1 via ORAL
  Filled 2021-04-20: qty 1

## 2021-04-20 MED ORDER — INSULIN ASPART PROT & ASPART (70-30 MIX) 100 UNIT/ML ~~LOC~~ SUSP: 10.0000 [IU] | SUBCUTANEOUS | Status: DC

## 2021-04-20 MED ORDER — HYDRALAZINE HCL 50 MG PO TABS
50.0000 mg | ORAL_TABLET | Freq: Three times a day (TID) | ORAL | Status: DC
  Administered 2021-04-20 – 2021-04-22 (×6): 50 mg via ORAL
  Filled 2021-04-20 (×6): qty 1

## 2021-04-20 MED ORDER — LIDOCAINE 5 % EX PTCH
1.0000 | MEDICATED_PATCH | CUTANEOUS | Status: DC
  Administered 2021-04-20 – 2021-04-21 (×2): 1 via TRANSDERMAL
  Filled 2021-04-20 (×3): qty 1

## 2021-04-20 MED ORDER — ATORVASTATIN CALCIUM 20 MG PO TABS
40.0000 mg | ORAL_TABLET | Freq: Every day | ORAL | Status: DC
  Administered 2021-04-20 – 2021-04-21 (×2): 40 mg via ORAL
  Filled 2021-04-20 (×2): qty 2

## 2021-04-20 MED ORDER — DIVALPROEX SODIUM ER 500 MG PO TB24
500.0000 mg | ORAL_TABLET | Freq: Two times a day (BID) | ORAL | Status: DC
  Administered 2021-04-20 – 2021-04-22 (×4): 500 mg via ORAL
  Filled 2021-04-20 (×5): qty 1

## 2021-04-20 MED ORDER — INSULIN ASPART PROT & ASPART (70-30 MIX) 100 UNIT/ML ~~LOC~~ SUSP
10.0000 [IU] | Freq: Every day | SUBCUTANEOUS | Status: DC
  Administered 2021-04-20: 19:00:00 10 [IU] via SUBCUTANEOUS
  Filled 2021-04-20: qty 10

## 2021-04-20 MED ORDER — PANTOPRAZOLE SODIUM 40 MG PO TBEC
40.0000 mg | DELAYED_RELEASE_TABLET | Freq: Every day | ORAL | Status: DC
  Administered 2021-04-21 – 2021-04-22 (×2): 40 mg via ORAL
  Filled 2021-04-20 (×2): qty 1

## 2021-04-20 MED ORDER — ASPIRIN EC 81 MG PO TBEC
81.0000 mg | DELAYED_RELEASE_TABLET | Freq: Every day | ORAL | Status: DC
  Administered 2021-04-21 – 2021-04-22 (×2): 81 mg via ORAL
  Filled 2021-04-20 (×2): qty 1

## 2021-04-20 MED ORDER — ASCORBIC ACID 500 MG PO TABS
1000.0000 mg | ORAL_TABLET | Freq: Every day | ORAL | Status: DC
  Administered 2021-04-21 – 2021-04-22 (×2): 1000 mg via ORAL
  Filled 2021-04-20 (×2): qty 2

## 2021-04-20 MED ORDER — FUROSEMIDE 20 MG PO TABS
20.0000 mg | ORAL_TABLET | Freq: Every day | ORAL | Status: DC
  Administered 2021-04-21 – 2021-04-22 (×2): 20 mg via ORAL
  Filled 2021-04-20 (×2): qty 1

## 2021-04-20 MED ORDER — ACETAMINOPHEN 325 MG PO TABS: 650.0000 mg | ORAL_TABLET | Freq: Four times a day (QID) | ORAL | Status: DC | PRN

## 2021-04-20 MED ORDER — ALBUTEROL SULFATE (2.5 MG/3ML) 0.083% IN NEBU: 3.0000 mL | INHALATION_SOLUTION | RESPIRATORY_TRACT | Status: DC | PRN

## 2021-04-20 MED ORDER — ENOXAPARIN SODIUM 40 MG/0.4ML IJ SOSY
40.0000 mg | PREFILLED_SYRINGE | INTRAMUSCULAR | Status: DC
  Administered 2021-04-20 – 2021-04-21 (×2): 40 mg via SUBCUTANEOUS
  Filled 2021-04-20 (×2): qty 0.4

## 2021-04-20 MED ORDER — LORAZEPAM 2 MG/ML IJ SOLN: 1.0000 mg | INTRAMUSCULAR | Status: DC | PRN

## 2021-04-20 MED ORDER — MORPHINE SULFATE (PF) 2 MG/ML IV SOLN
0.5000 mg | INTRAVENOUS | Status: DC | PRN
  Administered 2021-04-21: 11:00:00 0.5 mg via INTRAVENOUS
  Filled 2021-04-20: qty 1

## 2021-04-20 MED ORDER — INSULIN ASPART 100 UNIT/ML IJ SOLN
0.0000 [IU] | Freq: Every day | INTRAMUSCULAR | Status: DC
  Administered 2021-04-20: 22:00:00 2 [IU] via SUBCUTANEOUS
  Filled 2021-04-20: qty 1

## 2021-04-20 MED ORDER — ONDANSETRON HCL 4 MG/2ML IJ SOLN
4.0000 mg | Freq: Once | INTRAMUSCULAR | Status: AC
  Administered 2021-04-20: 10:00:00 4 mg via INTRAVENOUS
  Filled 2021-04-20: qty 2

## 2021-04-20 MED ORDER — DONEPEZIL HCL 5 MG PO TABS
10.0000 mg | ORAL_TABLET | Freq: Every day | ORAL | Status: DC
  Administered 2021-04-20 – 2021-04-21 (×2): 10 mg via ORAL
  Filled 2021-04-20 (×2): qty 2

## 2021-04-20 MED ORDER — HYDRALAZINE HCL 20 MG/ML IJ SOLN: 5.0000 mg | INTRAMUSCULAR | Status: DC | PRN

## 2021-04-20 MED ORDER — MORPHINE SULFATE (PF) 4 MG/ML IV SOLN
4.0000 mg | Freq: Once | INTRAVENOUS | Status: AC
  Administered 2021-04-20: 10:00:00 4 mg via INTRAVENOUS
  Filled 2021-04-20: qty 1

## 2021-04-20 MED ORDER — LISINOPRIL 20 MG PO TABS
40.0000 mg | ORAL_TABLET | Freq: Every day | ORAL | Status: DC
  Administered 2021-04-20 – 2021-04-21 (×2): 40 mg via ORAL
  Filled 2021-04-20 (×2): qty 2

## 2021-04-20 MED ORDER — ATENOLOL 50 MG PO TABS
50.0000 mg | ORAL_TABLET | Freq: Every day | ORAL | Status: DC
  Administered 2021-04-20 – 2021-04-22 (×3): 50 mg via ORAL
  Filled 2021-04-20 (×2): qty 1
  Filled 2021-04-20: qty 2

## 2021-04-20 MED ORDER — DM-GUAIFENESIN ER 30-600 MG PO TB12: 1.0000 | ORAL_TABLET | Freq: Two times a day (BID) | ORAL | Status: DC | PRN

## 2021-04-20 MED ORDER — COLCHICINE 0.6 MG PO TABS
0.6000 mg | ORAL_TABLET | Freq: Every day | ORAL | Status: DC | PRN
  Filled 2021-04-20: qty 1

## 2021-04-20 MED ORDER — CARBIDOPA-LEVODOPA 25-100 MG PO TABS
1.0000 | ORAL_TABLET | Freq: Three times a day (TID) | ORAL | Status: DC
  Administered 2021-04-20 – 2021-04-22 (×6): 1 via ORAL
  Filled 2021-04-20 (×7): qty 1

## 2021-04-20 MED ORDER — INSULIN ASPART PROT & ASPART (70-30 MIX) 100 UNIT/ML ~~LOC~~ SUSP
30.0000 [IU] | Freq: Every day | SUBCUTANEOUS | Status: DC
  Administered 2021-04-21: 13:00:00 30 [IU] via SUBCUTANEOUS
  Filled 2021-04-20 (×2): qty 10

## 2021-04-20 MED ORDER — HEPARIN SODIUM (PORCINE) 5000 UNIT/ML IJ SOLN: 5000.0000 [IU] | Freq: Three times a day (TID) | INTRAMUSCULAR | Status: DC

## 2021-04-20 MED ORDER — ONDANSETRON HCL 4 MG/2ML IJ SOLN: 4.0000 mg | Freq: Three times a day (TID) | INTRAMUSCULAR | Status: DC | PRN

## 2021-04-20 MED ORDER — METOPROLOL SUCCINATE ER 50 MG PO TB24: 50.0000 mg | ORAL_TABLET | Freq: Every day | ORAL | Status: DC

## 2021-04-20 MED ORDER — RIVASTIGMINE TARTRATE 1.5 MG PO CAPS
1.5000 mg | ORAL_CAPSULE | Freq: Two times a day (BID) | ORAL | Status: DC
  Administered 2021-04-21 – 2021-04-22 (×3): 1.5 mg via ORAL
  Filled 2021-04-20 (×7): qty 1

## 2021-04-20 NOTE — H&P (Signed)
History and Physical    Gilbert [name] Sr. YYQ:[id number] DOB: 06-29-1940 DOA: 04/20/2021  Referring MD/NP/PA:   PCP: [name], MD   Patient coming from:  The patient is coming from home.  At baseline, pt is independent for most of ADL.        Chief Complaint: fall and chest pain  HPI: Gilbert [name] Sr. is a 80 y.o. male with medical history significant of hypertension, hyperlipidemia, diabetes mellitus, stroke, hypothyroidism, Parkinson's disease, seizure, CKD stage IIIa, who presents with fall and chest pain.  Pt states that he fell accidentally when he was walking home from his car and tripped his steps last night. No LOC. Patient strongly denies any head or neck injury.  Pt refused CT of head. He developed pain in the right upper lateral chest wall, which is constant, sharp, severe, nonradiating, aggravated by movement.  Patient denies cough, shortness of breath.  No fever or chills.  No nausea vomiting, diarrhea or abdominal pain.  No symptoms of UTI. Patient was found to have oxygen desaturating to 86% on room air, which improved to 92% on 2 L oxygen.  Patient is not using oxygen normally.  ED Course: pt was found to have negative COVID PCR, WBC 8.8, troponin level 36, stable renal function, temperature normal, blood pressure 149/90, heart rate 65, RR 18.  Chest x-ray showed low volume and bilateral basilar atelectasis.  CT angiogram negative for PE, but showed right second rib fracture.  Patient is placed on MedSurg bed of observation  Review of Systems:   General: no fevers, chills, no body weight gain, has fatigue HEENT: no blurry vision, hearing changes or sore throat Respiratory: no dyspnea, coughing, wheezing CV: no chest pain, no palpitations GI: no nausea, vomiting, abdominal pain, diarrhea, constipation GU: no dysuria, burning on urination, increased urinary frequency, hematuria  Ext: no leg edema Neuro: no unilateral weakness, numbness, or tingling, no  vision change or hearing loss. Has fall. Skin: no rash, no skin tear. MSK: has pain in the right upper lateral chest wall. Heme: No easy bruising.  Travel history: No recent long distant travel.  Allergy: No Known Allergies  Past Medical History:  Diagnosis Date   Choledocholithiasis    Chronic cholecystitis    Diabetes mellitus without complication (HCC)    Hypertension    Parkinson disease ([place])    Stroke [facility])     Past Surgical History:  Procedure Laterality Date   ELBOW SURGERY     ENDOSCOPIC RETROGRADE CHOLANGIOPANCREATOGRAPHY (ERCP) WITH PROPOFOL N/A 06/12/2019   Procedure: ENDOSCOPIC RETROGRADE CHOLANGIOPANCREATOGRAPHY (ERCP) WITH PROPOFOL;  Surgeon: Lucilla Lame, MD;  Location: [facility] ENDOSCOPY;  Service: Endoscopy;  Laterality: N/A;   ERCP N/A 07/10/2019   Procedure: ENDOSCOPIC RETROGRADE CHOLANGIOPANCREATOGRAPHY (ERCP) STENT REMOVAL;  Surgeon: Lucilla Lame, MD;  Location: [facility] ENDOSCOPY;  Service: Endoscopy;  Laterality: N/A;   subtotal cholecystectomy  06/13/2019    Social History:  reports that he has never smoked. He has never used smokeless tobacco. He reports that he does not drink alcohol and does not use drugs.  Family History:  Family History  Problem Relation Age of Onset   Brain cancer Mother    Prostate cancer Neg Hx    Bladder Cancer Neg Hx    Kidney cancer Neg Hx      Prior to Admission medications   Medication Sig Start Date End Date Taking? Authorizing Provider  amLODipine (NORVASC) 10 MG tablet Take 1 tablet (10 mg total) by mouth daily.  05/10/16   Gladstone Lighter, MD  aspirin EC 81 MG EC tablet Take 1 tablet (81 mg total) by mouth daily. 05/10/16   Gladstone Lighter, MD  atenolol (TENORMIN) 50 MG tablet Take 50 mg by mouth daily.    [provider]  atorvastatin (LIPITOR) 80 MG tablet Take 40 mg by mouth at bedtime.     [provider]  carbidopa-levodopa (SINEMET IR) 25-100 MG tablet Take 1 tablet by mouth 3 (three) times daily.  12/07/18   [provider]  divalproex (DEPAKOTE ER) 500 MG 24 hr tablet Take 500 mg by mouth 2 (two) times daily.     [provider]  donepezil (ARICEPT) 10 MG tablet Take 10 mg by mouth daily. 11/23/18   [provider]  hydrALAZINE (APRESOLINE) 50 MG tablet Take 50 mg by mouth 3 (three) times daily.  09/28/18   [provider]  insulin aspart protamine- aspart (NOVOLOG MIX 70/30) (70-30) 100 UNIT/ML injection Inject 20-45 Units into the skin See admin instructions. Inject 45u under the skin every morning and inject 20u under the skin every night    [provider]  levothyroxine (SYNTHROID, LEVOTHROID) 75 MCG tablet Take 75 mcg by mouth daily before breakfast.    [provider]  lisinopril (PRINIVIL,ZESTRIL) 40 MG tablet Take 1 tablet (40 mg total) by mouth at bedtime. 05/10/16   Gladstone Lighter, MD  vitamin C (ASCORBIC ACID) 500 MG tablet Take 1,000 mg by mouth daily.    [provider]    Physical Exam: Vitals:   04/20/21 0947 04/20/21 0948 04/20/21 1005  BP:  (!) 149/90   Pulse:  65   Resp:  18   Temp:  97.6 F (36.4 C)   TempSrc:  Oral   SpO2:  92% (!) 86%  Weight: 88 kg    Height: 5\' 10"  (1.778 m)     General: Not in acute distress HEENT:       Eyes: PERRL, EOMI, no scleral icterus.       ENT: No discharge from the ears and nose, no pharynx injection, no tonsillar enlargement.        Neck: No JVD, no bruit, no mass felt. Heme: No neck lymph node enlargement. Cardiac: S1/S2, RRR, No murmurs, No gallops or rubs. Respiratory: No rales, wheezing, rhonchi or rubs. GI: Soft, nondistended, nontender, no rebound pain, no organomegaly, BS present. GU: No hematuria Ext: No pitting leg edema bilaterally. 1+DP/PT pulse bilaterally. Musculoskeletal: has tenderness in the right upper lateral chest wall. Skin: No rashes.  Neuro: Alert, oriented X3, cranial nerves II-XII grossly intact, moves all extremities normally.  Psych:  Patient is not psychotic, no suicidal or hemocidal ideation.  Labs on Admission: I have personally reviewed following labs and imaging studies  CBC: Recent Labs  Lab 04/20/21 1019  WBC 8.8  NEUTROABS 6.7  HGB 13.0  HCT 39.9  MCV 95.5  PLT 629*   Basic Metabolic Panel: Recent Labs  Lab 04/20/21 1019  NA 136  K 4.5  CL 101  CO2 29  GLUCOSE 257*  BUN 21  CREATININE 1.36*  CALCIUM 8.2*   GFR: Estimated Creatinine Clearance: 46.8 mL/min (A) (by C-G formula based on SCr of 1.36 mg/dL (H)). Liver Function Tests: Recent Labs  Lab 04/20/21 1019  AST 23  ALT 7  ALKPHOS 83  BILITOT 0.9  PROT 7.3  ALBUMIN 3.4*   No results for input(s): LIPASE, AMYLASE in the last 168 hours. No results for input(s): AMMONIA in  the last 168 hours. Coagulation Profile: No results for input(s): INR, PROTIME in the last 168 hours. Cardiac Enzymes: No results for input(s): CKTOTAL, CKMB, CKMBINDEX, TROPONINI in the last 168 hours. BNP (last 3 results) No results for input(s): PROBNP in the last 8760 hours. HbA1C: No results for input(s): HGBA1C in the last 72 hours. CBG: No results for input(s): GLUCAP in the last 168 hours. Lipid Profile: No results for input(s): CHOL, HDL, LDLCALC, TRIG, CHOLHDL, LDLDIRECT in the last 72 hours. Thyroid Function Tests: No results for input(s): TSH, T4TOTAL, FREET4, T3FREE, THYROIDAB in the last 72 hours. Anemia Panel: No results for input(s): VITAMINB12, FOLATE, FERRITIN, TIBC, IRON, RETICCTPCT in the last 72 hours. Urine analysis:    Component Value Date/Time   COLORURINE AMBER (A) 06/10/2019 1517   APPEARANCEUR CLEAR (A) 06/10/2019 1517   APPEARANCEUR Clear 11/11/2016 1406   LABSPEC 1.016 06/10/2019 1517   PHURINE 6.0 06/10/2019 1517   GLUCOSEU NEGATIVE 06/10/2019 1517   HGBUR NEGATIVE 06/10/2019 1517   BILIRUBINUR NEGATIVE 06/10/2019 1517   BILIRUBINUR Negative 11/11/2016 1406   KETONESUR 5 (A) 06/10/2019 1517   PROTEINUR 100 (A) 06/10/2019  1517   NITRITE NEGATIVE 06/10/2019 1517   LEUKOCYTESUR NEGATIVE 06/10/2019 1517   Sepsis Labs: @LABRCNTIP (procalcitonin:4,lacticidven:4) ) Recent Results (from the past 240 hour(s))  Resp Panel by RT-PCR (Flu A&B, Covid) Nasopharyngeal Swab     Status: None   Collection Time: 04/20/21 10:31 AM   Specimen: Nasopharyngeal Swab; Nasopharyngeal(NP) swabs in vial transport medium  Result Value Ref Range Status   SARS Coronavirus 2 by RT PCR NEGATIVE NEGATIVE Final    Comment: (NOTE) SARS-CoV-2 target nucleic acids are NOT DETECTED.  The SARS-CoV-2 RNA is generally detectable in upper respiratory specimens during the acute phase of infection. The lowest concentration of SARS-CoV-2 viral copies this assay can detect is 138 copies/mL. A negative result does not preclude SARS-Cov-2 infection and should not be used as the sole basis for treatment or other patient management decisions. A negative result may occur with  improper specimen collection/handling, submission of specimen other than nasopharyngeal swab, presence of viral mutation(s) within the areas targeted by this assay, and inadequate number of viral copies(<138 copies/mL). A negative result must be combined with clinical observations, patient history, and epidemiological information. The expected result is Negative.  Fact Sheet for Patients:  EntrepreneurPulse.com.au  Fact Sheet for Healthcare Providers:  IncredibleEmployment.be  This test is no t yet approved or cleared by the [place] FDA and  has been authorized for detection and/or diagnosis of SARS-CoV-2 by FDA under an Emergency Use Authorization (EUA). This EUA will remain  in effect (meaning this test can be used) for the duration of the COVID-19 declaration under Section 564(b)(1) of the Act, 21 U.S.C.section 360bbb-3(b)(1), unless the authorization is terminated  or revoked sooner.       Influenza A by PCR NEGATIVE  NEGATIVE Final   Influenza B by PCR NEGATIVE NEGATIVE Final    Comment: (NOTE) The Xpert Xpress SARS-CoV-2/FLU/RSV plus assay is intended as an aid in the diagnosis of influenza from Nasopharyngeal swab specimens and should not be used as a sole basis for treatment. Nasal washings and aspirates are unacceptable for Xpert Xpress SARS-CoV-2/FLU/RSV testing.  Fact Sheet for Patients: EntrepreneurPulse.com.au  Fact Sheet for Healthcare Providers: IncredibleEmployment.be  This test is not yet approved or cleared by the [place] FDA and has been authorized for detection and/or diagnosis of SARS-CoV-2 by FDA under an Emergency Use Authorization (EUA). This EUA will  remain in effect (meaning this test can be used) for the duration of the COVID-19 declaration under Section 564(b)(1) of the Act, 21 U.S.C. section 360bbb-3(b)(1), unless the authorization is terminated or revoked.  Performed at [facility], [address], [place], [place] [address]      Radiological Exams on Admission: CT Angio Chest PE W and/or Wo Contrast  Result Date: 04/20/2021 CLINICAL DATA:  Right-sided chest pain after a fall. EXAM: CT ANGIOGRAPHY CHEST WITH CONTRAST TECHNIQUE: Multidetector CT imaging of the chest was performed using the standard protocol during bolus administration of intravenous contrast. Multiplanar CT image reconstructions and MIPs were obtained to evaluate the vascular anatomy. CONTRAST:  2mL OMNIPAQUE IOHEXOL 350 MG/ML SOLN COMPARISON:  Chest radiograph of earlier in the day. FINDINGS: Cardiovascular: The quality of this exam for evaluation of pulmonary embolism is good. The bolus is well timed. No evidence of pulmonary embolism. Aortic atherosclerosis. Tortuous thoracic aorta. Moderate cardiomegaly, without pericardial effusion. Left main and 3 vessel coronary artery calcification. Mediastinum/Nodes: Calcified mediastinal and right hilar nodes  are related to old granulomatous disease. Lungs/Pleura: No pleural fluid. No pneumothorax. Mild right hemidiaphragm elevation with bibasilar atelectasis in the setting of poor inspiratory effort. Right apical and left lower lobe calcified granulomas. Upper Abdomen: Normal imaged portions of the liver, spleen, stomach. Musculoskeletal: Remote posteromedial lower left rib fractures. Anterolateral second right rib minimally displaced fracture including on 34/4. Review of the MIP images confirms the above findings. IMPRESSION: 1. No evidence of pulmonary embolism. 2. Right second rib fracture, minimally displaced. 3. Coronary artery atherosclerosis. Aortic Atherosclerosis (ICD10-I70.0). Electronically Signed   By: Abigail Miyamoto M.D.   On: 04/20/2021 12:09   DG Chest Portable 1 View  Result Date: 04/20/2021 CLINICAL DATA:  Fall.  Hypoxia. EXAM: PORTABLE CHEST 1 VIEW COMPARISON:  12/10/2018 FINDINGS: 1059 hours. Low volume film. Cardiopericardial silhouette is at upper limits of normal for size. Basilar atelectasis bilaterally. No pleural effusion or pulmonary edema. The visualized bony structures of the thorax show no acute abnormality. IMPRESSION: Low volume film with bibasilar atelectasis. Electronically Signed   By: Misty Stanley M.D.   On: 04/20/2021 11:16     EKG: I have personally reviewed.  Sinus rhythm, QTC 499, bifascicular block which is old, poor R wave progression  Assessment/Plan Principal Problem:   Fall Active Problems:   CVA (cerebral vascular accident) ([place])   Seizure disorder ([place])   Type II diabetes mellitus with renal manifestations ([place])   Right rib fracture   Acute respiratory failure with hypoxia (HCC)   Elevated troponin   HTN (hypertension)   HLD (hyperlipidemia)   Hypothyroidism   Parkinson disease (HCC)   CKD (chronic kidney disease), stage IIIa   Chronic diastolic CHF (congestive heart failure) ([place])    Fall and right 2nd rib fracture: Patient strongly denies any  head or neck injury.  Refused CT of the head. -Place in med-surg bed for obs -fall precaution  -pain control: As needed morphine, Percocet, Tylenol, Lidoderm -PT/OT  Acute respiratory failure with hypoxia: Patient denies any cough, shortness of breath.  He has oxygen desaturating to 86% on room air, which improved to 92 % on 2 L oxygen.  Patient does not have leg edema or pulm edema chest x-ray, does not seem to have CHF.  CT angiogram is negative for PE.  Patient has right second rib fracture and chest wall pain, which is limiting chest wall movement for normal breathing. -focus on pain control: as above -prn albuterol -Continue  nasal cannula oxygen to maintain oxygen saturation above 93% -check BNP  Hx of CVA (cerebral vascular accident) ([place]) -Aspirin, Lipitor  Seizure disorder ([place]) -Seizure precaution -When necessary Ativan for seizure -Continue Home medications: Depakote  Type II diabetes mellitus with renal manifestations [facility]): Recent A1c 6.9, well controlled.  Patient still taking 70/30 insulin -SSI -Decrease 70/30 insulin dose from 45-15 unit twice daily to 30-10 units bid   Elevated troponin: trop 36.  Possibly due to demand ischemia -Continue aspirin, Lipitor -Trend troponin -Check A1c, FLP  HTN (hypertension): -IV hydralazine as needed -atenolol, oral hydralazine, lisinopril. -will hold metoprolol since patient is already on atenolol  Chronic diastolic CHF: 2D echo on 2/86/3817 showed EF of 55 to 60% with grade 1 diastolic dysfunction.  Patient does not have leg edema or JVD.  To be compensated. -Continue home Lasix, 20 mg daily -Check BNP  HLD (hyperlipidemia) -Lipitor  Hypothyroidism -Synthroid  Parkinson disease (HCC) -Sinemet  CKD (chronic kidney disease), stage IIIa: Stable.  Recent baseline creatinine 1.2-1.4.  His creatinine is 1.36, BUN 21 -Follow-up with BMP         DVT ppx: SQ Heparin    Code Status: Full code Family Communication:   Yes, patient's wife and granddaughter    at bed side.  Disposition Plan:  Anticipate discharge back to previous environment Consults called:  none Admission status and Level of care: Telemetry Medical:  for obs    Status is: Observation  The patient remains OBS appropriate and will d/c before 2 midnights.         Date of Service 04/20/2021    Ivor Costa [facility]   If 7PM-7AM, please contact night-coverage [email or web address] 04/20/2021, 1:49 PM

## 2021-04-20 NOTE — ED Notes (Signed)
O2 sats 88% on RA, o2 at 2l per [place].

## 2021-04-20 NOTE — ED Triage Notes (Signed)
Pt in via EMS from home with c/o fall and now with back pain. EMS reports pt HTN and has not taken meds this am, no LOC. Pt ambulatory after fall

## 2021-04-20 NOTE — ED Notes (Signed)
Pt states was walking into his house from the car and tripped on curve. Pt believes he fell backwards onto curve. Pt is co right sided chest pain that radiates to right scapula area. Pt states worse when he moves his right arm. Pt denies any head injury or neck pain. Pt was able to stand after fall. Denies any pain to hips or lower extremities.

## 2021-04-20 NOTE — ED Notes (Signed)
Pt awaiting hospital bed availability.Marland KitchenMarland Kitchen

## 2021-04-20 NOTE — ED Provider Notes (Addendum)
[facility] Emergency Department Provider Note  ____________________________________________   Event Date/Time   First MD Initiated Contact with Patient 04/20/21 [phone]     (approximate)  I have reviewed the triage vital signs and the nursing notes.   HISTORY  Chief Complaint Fall and Back Pain    HPI Gilbert DAX Sr. is a 80 y.o. male presents emergency department after a fall yesterday.  Patient states that he fell out of the car last night.  His wife had to help him up.  States he slipped on the sofa on his left side because the right side was hurting so bad.  States his ribs and chest hurt.  No fever or chills.  No pain in his hips or legs.  No LOC.  Patient does have history of CVA, seizure disorder, diabetes, and Parkinson's  Past Medical History:  Diagnosis Date   Choledocholithiasis    Chronic cholecystitis    Diabetes mellitus without complication ([place])    Hypertension    Parkinson disease ([place])    Stroke [facility])     Patient Active Problem List   Diagnosis Date Noted   Fall 04/20/2021   Right rib fracture 04/20/2021   Acute respiratory failure with hypoxia ([place]) 04/20/2021   Elevated troponin 04/20/2021   HTN (hypertension) 04/20/2021   HLD (hyperlipidemia) 04/20/2021   Hypothyroidism 04/20/2021   Parkinson disease ([place])    CKD (chronic kidney disease), stage IIIa    Type II diabetes mellitus with renal manifestations ([place]) 06/10/2019   Abnormal findings on diagnostic imaging of gall bladder 06/10/2019   Obstructive jaundice 06/10/2019   Abdominal pain 06/10/2019   History of CVA (cerebrovascular accident) without residual deficits 06/10/2019   Hypertensive urgency 06/10/2019   Hypoglycemia associated with type 2 diabetes mellitus ([place]) 06/10/2019   Dehydration 12/10/2018   Seizure disorder ([place])    Expressive aphasia    CVA (cerebral vascular accident) ([place]) 05/05/2016    Past Surgical History:  Procedure Laterality Date    ELBOW SURGERY     ENDOSCOPIC RETROGRADE CHOLANGIOPANCREATOGRAPHY (ERCP) WITH PROPOFOL N/A 06/12/2019   Procedure: ENDOSCOPIC RETROGRADE CHOLANGIOPANCREATOGRAPHY (ERCP) WITH PROPOFOL;  Surgeon: Lucilla Lame, MD;  Location: [facility] ENDOSCOPY;  Service: Endoscopy;  Laterality: N/A;   ERCP N/A 07/10/2019   Procedure: ENDOSCOPIC RETROGRADE CHOLANGIOPANCREATOGRAPHY (ERCP) STENT REMOVAL;  Surgeon: Lucilla Lame, MD;  Location[facility] ENDOSCOPY;  Service: Endoscopy;  Laterality: N/A;   subtotal cholecystectomy  06/13/2019    Prior to Admission medications   Medication Sig Start Date End Date Taking? Authorizing Provider  amLODipine (NORVASC) 10 MG tablet Take 1 tablet (10 mg total) by mouth daily. 05/10/16   Gladstone Lighter, MD  aspirin EC 81 MG EC tablet Take 1 tablet (81 mg total) by mouth daily. 05/10/16   Gladstone Lighter, MD  atenolol (TENORMIN) 50 MG tablet Take 50 mg by mouth daily.    [provider]  atorvastatin (LIPITOR) 80 MG tablet Take 40 mg by mouth at bedtime.     [provider]  carbidopa-levodopa (SINEMET IR) 25-100 MG tablet Take 1 tablet by mouth 3 (three) times daily. 12/07/18   [provider]  divalproex (DEPAKOTE ER) 500 MG 24 hr tablet Take 500 mg by mouth 2 (two) times daily.     [provider]  donepezil (ARICEPT) 10 MG tablet Take 10 mg by mouth daily. 11/23/18   [provider]  hydrALAZINE (APRESOLINE) 50 MG tablet Take 50 mg by mouth 3 (three) times daily.  09/28/18  [provider]  insulin aspart protamine- aspart (NOVOLOG MIX 70/30) (70-30) 100 UNIT/ML injection Inject 20-45 Units into the skin See admin instructions. Inject 45u under the skin every morning and inject 20u under the skin every night    [provider]  levothyroxine (SYNTHROID, LEVOTHROID) 75 MCG tablet Take 75 mcg by mouth daily before breakfast.    [provider]  lisinopril (PRINIVIL,ZESTRIL) 40 MG tablet Take 1 tablet (40 mg total) by  mouth at bedtime. 05/10/16   Gladstone Lighter, MD  vitamin C (ASCORBIC ACID) 500 MG tablet Take 1,000 mg by mouth daily.    [provider]    Allergies Patient has no known allergies.  Family History  Problem Relation Age of Onset   Brain cancer Mother    Prostate cancer Neg Hx    Bladder Cancer Neg Hx    Kidney cancer Neg Hx     Social History Social History   Tobacco Use   Smoking status: Never   Smokeless tobacco: Never  Vaping Use   Vaping Use: Never used  Substance Use Topics   Alcohol use: No   Drug use: No    Review of Systems  Constitutional: No fever/chills Eyes: No visual changes. ENT: No sore throat. Respiratory: Denies cough Cardiovascular: Positive chest pain Gastrointestinal: Denies abdominal pain Genitourinary: Negative for dysuria. Musculoskeletal: Negative for back pain. Skin: Negative for rash. Psychiatric: no mood changes,     ____________________________________________   PHYSICAL EXAM:  VITAL SIGNS: ED Triage Vitals  Enc Vitals Group     BP 04/20/21 0948 (!) 149/90     Pulse Rate 04/20/21 0948 65     Resp 04/20/21 0948 18     Temp 04/20/21 0948 97.6 F (36.4 C)     Temp Source 04/20/21 0948 Oral     SpO2 04/20/21 0948 92 %     Weight 04/20/21 0947 194 lb (88 kg)     Height 04/20/21 0947 5\' 10"  (1.778 m)     Head Circumference --      Peak Flow --      Pain Score 04/20/21 0946 10     Pain Loc --      Pain Edu? --      Excl. in [place]? --     Constitutional: Alert and oriented. Well appearing and in no acute distress. Eyes: Conjunctivae are normal.  Head: Atraumatic. Nose: No congestion/rhinnorhea. Mouth/Throat: Mucous membranes are moist.   Neck:  supple no lymphadenopathy noted Cardiovascular: Normal rate, regular rhythm. Heart sounds are normal Respiratory: Normal respiratory effort.  No retractions, lungs c t a, right side of the chest is tender to palpation, slight swelling noted, spine is slightly tender to  palpation Abd: soft nontender bs normal all 4 quad, no bruising noted, area right lower ribs tender GU: deferred Musculoskeletal: FROM all extremities, warm and well perfused, extremities are nontender Neurologic:  Normal speech and language.  Skin:  Skin is warm, dry and intact. No rash noted. Psychiatric: Mood and affect are normal. Speech and behavior are normal.  ____________________________________________   LABS (all labs ordered are listed, but only abnormal results are displayed)  Labs Reviewed  COMPREHENSIVE METABOLIC PANEL - Abnormal; Notable for the following components:      Result Value   Glucose, Bld 257 (*)    Creatinine, Ser 1.36 (*)    Calcium 8.2 (*)    Albumin 3.4 (*)    GFR, Estimated 52 (*)    All other components within  normal limits  CBC WITH DIFFERENTIAL/PLATELET - Abnormal; Notable for the following components:   RBC 4.18 (*)    Platelets 137 (*)    All other components within normal limits  TROPONIN I (HIGH SENSITIVITY) - Abnormal; Notable for the following components:   Troponin I (High Sensitivity) 36 (*)    All other components within normal limits  RESP PANEL BY RT-PCR (FLU A&B, COVID) ARPGX2  BRAIN NATRIURETIC PEPTIDE  HEMOGLOBIN A1C  TROPONIN I (HIGH SENSITIVITY)   ____________________________________________   ____________________________________________  RADIOLOGY  CTA of the chest  ____________________________________________   PROCEDURES  Procedure(s) performed: No  Procedures    ____________________________________________   INITIAL IMPRESSION / ASSESSMENT AND PLAN / ED COURSE  Pertinent labs & imaging results that were available during my care of the patient were reviewed by me and considered in my medical decision making (see chart for details).   Patient is an 80 year old male presents after a fall.  Complaining of chest pain.  Physical exam shows him to be stable although his oxygen level has dropped to 86 on room  air.  I did have nursing staff place him on 2 L of O2.  Patient does not wear O2 at home.  He is tender across the right side of the chest, some swelling noted to the area.  No crepitus noted.  DDx: CVA, rib fracture, MI, pneumothorax  Chest x-ray due to the anterior wall chest pain along with concerns of pneumothorax  Order labs and EKG  Patient was given pain medication and nausea medication.  Placed on O2 2 L  Chest x-ray reviewed by me confirmed by radiology to be negative  Dr. Cherylann Banas and see the patient  Dr. Cherylann Banas will order CT chest for PE  CBC is normal with slight decrease in platelets, troponin slightly elevated at 36, however this may be normal for the patient's age since he is 38, comprehensive metabolic panel shows increase in glucose of 257, creatinine is a little elevated at 1.36, calcium is decreased to 82, his GFR is decreased at 52  CTA of the chest for PE shows a rib fracture but no PE or pneumothorax  We did discuss the findings with the patient and his family.  Due to the hypoxia the patient will be admitted to the hospital.  Consult to the hospitalist.  Dr. Blaine Hamper will be admitting patient   Gilbert FRAZZINI Sr. was evaluated in Emergency Department on 04/20/2021 for the symptoms described in the history of present illness. He was evaluated in the context of the global COVID-19 pandemic, which necessitated consideration that the patient might be at risk for infection with the SARS-CoV-2 virus that causes COVID-19. Institutional protocols and algorithms that pertain to the evaluation of patients at risk for COVID-19 are in a state of rapid change based on information released by regulatory bodies including the CDC and federal and state organizations. These policies and algorithms were followed during the patient's care in the ED.    As part of my medical decision making, I reviewed the following data within the [place] History obtained from  family, Nursing notes reviewed and incorporated, Labs reviewed , EKG interpreted first-degree heart, Old chart reviewed, Radiograph reviewed , Discussed with admitting physician Dr Blaine Hamper, Evaluated by EM attending Dr. Cherylann Banas, Notes from prior ED visits, and [place] Controlled Substance Database  ____________________________________________   FINAL CLINICAL IMPRESSION(S) / ED DIAGNOSES  Final diagnoses:  Hypoxia  Closed fracture of one rib of right side,  initial encounter  Fall, initial encounter      NEW MEDICATIONS STARTED DURING THIS VISIT:  New Prescriptions   No medications on file     Note:  This document was prepared using Dragon voice recognition software and may include unintentional dictation errors.    Versie Starks, PA-C 04/20/21 1300    Caryn Section Linden Dolin, PA-C 04/20/21 1344    Arta Silence, MD 04/20/21 1859

## 2021-04-21 ENCOUNTER — Encounter: Payer: Self-pay | Admitting: Internal Medicine

## 2021-04-21 DIAGNOSIS — Z7982 Long term (current) use of aspirin: Secondary | ICD-10-CM | POA: Diagnosis not present

## 2021-04-21 DIAGNOSIS — N183 Chronic kidney disease, stage 3 unspecified: Secondary | ICD-10-CM | POA: Diagnosis not present

## 2021-04-21 DIAGNOSIS — Z79899 Other long term (current) drug therapy: Secondary | ICD-10-CM | POA: Diagnosis not present

## 2021-04-21 DIAGNOSIS — W108XXA Fall (on) (from) other stairs and steps, initial encounter: Secondary | ICD-10-CM | POA: Diagnosis present

## 2021-04-21 DIAGNOSIS — W19XXXA Unspecified fall, initial encounter: Secondary | ICD-10-CM | POA: Diagnosis not present

## 2021-04-21 DIAGNOSIS — Z20822 Contact with and (suspected) exposure to covid-19: Secondary | ICD-10-CM | POA: Diagnosis present

## 2021-04-21 DIAGNOSIS — I130 Hypertensive heart and chronic kidney disease with heart failure and stage 1 through stage 4 chronic kidney disease, or unspecified chronic kidney disease: Secondary | ICD-10-CM | POA: Diagnosis present

## 2021-04-21 DIAGNOSIS — Z808 Family history of malignant neoplasm of other organs or systems: Secondary | ICD-10-CM | POA: Diagnosis not present

## 2021-04-21 DIAGNOSIS — Z794 Long term (current) use of insulin: Secondary | ICD-10-CM | POA: Diagnosis not present

## 2021-04-21 DIAGNOSIS — R0781 Pleurodynia: Secondary | ICD-10-CM | POA: Diagnosis present

## 2021-04-21 DIAGNOSIS — R778 Other specified abnormalities of plasma proteins: Secondary | ICD-10-CM | POA: Diagnosis present

## 2021-04-21 DIAGNOSIS — I5032 Chronic diastolic (congestive) heart failure: Secondary | ICD-10-CM | POA: Diagnosis present

## 2021-04-21 DIAGNOSIS — Y92018 Other place in single-family (private) house as the place of occurrence of the external cause: Secondary | ICD-10-CM | POA: Diagnosis not present

## 2021-04-21 DIAGNOSIS — G40909 Epilepsy, unspecified, not intractable, without status epilepticus: Secondary | ICD-10-CM | POA: Diagnosis present

## 2021-04-21 DIAGNOSIS — M25511 Pain in right shoulder: Secondary | ICD-10-CM | POA: Diagnosis present

## 2021-04-21 DIAGNOSIS — J9601 Acute respiratory failure with hypoxia: Secondary | ICD-10-CM | POA: Diagnosis present

## 2021-04-21 DIAGNOSIS — J9811 Atelectasis: Secondary | ICD-10-CM | POA: Diagnosis present

## 2021-04-21 DIAGNOSIS — E785 Hyperlipidemia, unspecified: Secondary | ICD-10-CM | POA: Diagnosis present

## 2021-04-21 DIAGNOSIS — E039 Hypothyroidism, unspecified: Secondary | ICD-10-CM | POA: Diagnosis present

## 2021-04-21 DIAGNOSIS — Z8673 Personal history of transient ischemic attack (TIA), and cerebral infarction without residual deficits: Secondary | ICD-10-CM | POA: Diagnosis not present

## 2021-04-21 DIAGNOSIS — Z79890 Hormone replacement therapy (postmenopausal): Secondary | ICD-10-CM | POA: Diagnosis not present

## 2021-04-21 DIAGNOSIS — E1122 Type 2 diabetes mellitus with diabetic chronic kidney disease: Secondary | ICD-10-CM | POA: Diagnosis present

## 2021-04-21 DIAGNOSIS — G20 Parkinson's disease: Secondary | ICD-10-CM | POA: Diagnosis present

## 2021-04-21 DIAGNOSIS — N1831 Chronic kidney disease, stage 3a: Secondary | ICD-10-CM | POA: Diagnosis present

## 2021-04-21 DIAGNOSIS — S2231XA Fracture of one rib, right side, initial encounter for closed fracture: Secondary | ICD-10-CM | POA: Diagnosis present

## 2021-04-21 DIAGNOSIS — Y9301 Activity, walking, marching and hiking: Secondary | ICD-10-CM | POA: Diagnosis present

## 2021-04-21 LAB — LIPID PANEL
Cholesterol: 105 mg/dL (ref 0–200)
HDL: 34 mg/dL — ABNORMAL LOW (ref >40)
LDL Cholesterol: 37 mg/dL (ref 0–99)
Total CHOL/HDL Ratio: 3.1 RATIO
Triglycerides: 168 mg/dL — ABNORMAL HIGH (ref <150)
VLDL: 34 mg/dL (ref 0–40)

## 2021-04-21 LAB — HEMOGLOBIN A1C
Hgb A1c MFr Bld: 8.3 % — ABNORMAL HIGH (ref 4.8–5.6)
Mean Plasma Glucose: 192 mg/dL

## 2021-04-21 LAB — GLUCOSE, CAPILLARY
Glucose-Capillary: 189 mg/dL — ABNORMAL HIGH (ref 70–99)
Glucose-Capillary: 203 mg/dL — ABNORMAL HIGH (ref 70–99)
Glucose-Capillary: 224 mg/dL — ABNORMAL HIGH (ref 70–99)
Glucose-Capillary: 122 mg/dL — ABNORMAL HIGH (ref 70–99)

## 2021-04-21 MED ORDER — INSULIN ASPART PROT & ASPART (70-30 MIX) 100 UNIT/ML ~~LOC~~ SUSP
15.0000 [IU] | Freq: Every day | SUBCUTANEOUS | Status: DC
  Administered 2021-04-21: 17:00:00 15 [IU] via SUBCUTANEOUS
  Filled 2021-04-21: qty 10

## 2021-04-21 MED ORDER — INSULIN ASPART PROT & ASPART (70-30 MIX) 100 UNIT/ML ~~LOC~~ SUSP
45.0000 [IU] | Freq: Every day | SUBCUTANEOUS | Status: DC
  Administered 2021-04-22: 09:00:00 45 [IU] via SUBCUTANEOUS
  Filled 2021-04-21: qty 10

## 2021-04-21 NOTE — Evaluation (Signed)
Occupational Therapy Evaluation Patient Details Name: TORRANCE STOCKLEY Sr. MRN: [id number] DOB: 22-Mar-1941 Today's Date: 04/21/2021   History of Present Illness ROYAL VANDEVOORT Sr. is a 80 y.o. male with medical history significant of hypertension, hyperlipidemia, diabetes mellitus, stroke, hypothyroidism, Parkinson's disease, seizure, CKD stage IIIa, who presents with fall and chest 04/21/21 Chest CT: Right 2nd rib fracture, minimally displaced.   Clinical Impression   Mr Molli Posey was seen for OT evaluation this date. Prior to hospital admission, pt was Independent for mobility and ADLs. Pt lives with spouse in 1 level home c 3 STE. Pt presents to acute OT demonstrating impaired ADL performance and functional mobility 2/2 decreased activity tolerance, pain, and functional ROM deficits. Pt currently requires MIN A and HOB elevated and increased time to exit L side of bed. MOD A don B socks seated EOB. MIN A + RW sit<>stand, CGA + RW for steps bed>chair. SBA + RW for urinal use in standing - single UE support only with good balance. Pt would benefit from skilled OT to address noted impairments and functional limitations (see below for any additional details) in order to maximize safety and independence while minimizing falls risk and caregiver burden. Upon hospital discharge, recommend HHOT to maximize pt safety and return to PLOF.       Recommendations for follow up therapy are one component of a multi-disciplinary discharge planning process, led by the attending physician.  Recommendations may be updated based on patient status, additional functional criteria and insurance authorization.   Follow Up Recommendations  Home health OT    Assistance Recommended at Discharge Intermittent Supervision/Assistance  Functional Status Assessment  Patient has had a recent decline in their functional status and demonstrates the ability to make significant improvements in function in a reasonable and  predictable amount of time.  Equipment Recommendations  BSC/3in1    Recommendations for Other Services       Precautions / Restrictions Precautions Precautions: Fall Restrictions Weight Bearing Restrictions: No      Mobility Bed Mobility Overal bed mobility: Needs Assistance Bed Mobility: Supine to Sit     Supine to sit: Min assist          Transfers Overall transfer level: Needs assistance Equipment used: Rolling walker (2 wheels) Transfers: Sit to/from Stand Sit to Stand: Min assist                  Balance Overall balance assessment: Needs assistance Sitting-balance support: No upper extremity supported;Feet supported Sitting balance-Leahy Scale: Good     Standing balance support: Single extremity supported;During functional activity Standing balance-Leahy Scale: Fair                             ADL either performed or assessed with clinical judgement   ADL Overall ADL's : Needs assistance/impaired                                       General ADL Comments: MOD A don B socks seated EOB. SBA + RW for urinal use in standing - single UE support only with good balance.      Pertinent Vitals/Pain Pain Assessment: 0-10 Pain Score: 10-Worst pain ever Pain Location: R flank Pain Descriptors / Indicators: Discomfort;Dull;Grimacing Pain Intervention(s): Limited activity within patient's tolerance;Patient requesting pain meds-RN notified;Repositioned     Hand Dominance  Extremity/Trunk Assessment Upper Extremity Assessment Upper Extremity Assessment: Generalized weakness   Lower Extremity Assessment Lower Extremity Assessment: Generalized weakness       Communication Communication Communication: HOH   Cognition Arousal/Alertness: Awake/alert Behavior During Therapy: WFL for tasks assessed/performed Overall Cognitive Status: Within Functional Limits for tasks assessed                                        General Comments  SpO2 84% on RA, resolved to 91% with 3L [place]    Exercises Exercises: Other exercises Other Exercises Other Exercises: Pt educated re: OT role, DME recs, dc recs, falls prevention   Shoulder Instructions      Home Living Family/patient expects to be discharged to:: Private residence Living Arrangements: Spouse/significant other Available Help at Discharge: Family Type of Home: House Home Access: Stairs to enter Technical brewer of Steps: 3 Entrance Stairs-Rails: Left;Right Home Layout: One level               Home Equipment: Conservation officer, nature (2 wheels);Cane - single point          Prior Functioning/Environment Prior Level of Function : Independent/Modified Independent             Mobility Comments: no AD for mobility and ADLs          OT Problem List: Decreased strength;Decreased range of motion;Decreased activity tolerance;Impaired balance (sitting and/or standing);Decreased safety awareness      OT Treatment/Interventions: Self-care/ADL training;Therapeutic exercise;Energy conservation;DME and/or AE instruction;Therapeutic activities;Patient/family education;Balance training    OT Goals(Current goals can be found in the care plan section) Acute Rehab OT Goals Patient Stated Goal: to go home OT Goal Formulation: With patient Time For Goal Achievement: 05/05/21 Potential to Achieve Goals: Good ADL Goals Pt Will Perform Grooming: with modified independence;standing (c LRAD PRN) Pt Will Perform Lower Body Dressing: with min assist;sit to/from stand (c LRAD PRN) Pt Will Transfer to Toilet: with supervision;ambulating;bedside commode (c LRAD PRN)  OT Frequency: Min 2X/week   Barriers to D/C:            Co-evaluation              AM-PAC OT "6 Clicks" Daily Activity     Outcome Measure Help from another person eating meals?: None Help from another person taking care of personal grooming?: A Little Help from another  person toileting, which includes using toliet, bedpan, or urinal?: A Little Help from another person bathing (including washing, rinsing, drying)?: A Lot Help from another person to put on and taking off regular upper body clothing?: A Little Help from another person to put on and taking off regular lower body clothing?: A Lot 6 Click Score: 17   End of Session Equipment Utilized During Treatment: Rolling walker (2 wheels) Nurse Communication: Patient requests pain meds  Activity Tolerance: Patient tolerated treatment well Patient left: in chair;with call bell/phone within reach  OT Visit Diagnosis: Other abnormalities of gait and mobility (R26.89);Muscle weakness (generalized) (M62.81)                Time: 4403-4742 OT Time Calculation (min): 27 min Charges:  OT General Charges $OT Visit: 1 Visit OT Evaluation $OT Eval Low Complexity: 1 Low OT Treatments $Self Care/Home Management : 8-22 mins  Dessie Coma, M.S. OTR/L  04/21/21, 12:31 PM  ascom [phone]

## 2021-04-21 NOTE — Evaluation (Signed)
Physical Therapy Evaluation Patient Details Name: Gilbert HANGARTNER Sr. MRN: [id number] DOB: 17-Nov-1940 Today's Date: 04/21/2021  History of Present Illness  Gilbert AYOUB Sr. is a 80 y.o. male with medical history significant of hypertension, hyperlipidemia, diabetes mellitus, stroke, hypothyroidism, Parkinson's disease, seizure, CKD stage IIIa, who presents with fall and chest 04/21/21 Chest CT: Right 2nd rib fracture, minimally displaced.  Clinical Impression  Patient seated in recliner upon arrival to room; alert and oriented, follows commands and agreeable to participation with session (min encouragement due to R UE/flank pain).  Pain grossly 6-7/10 (FACES scale) with movement attempts; meds requested and administered during session per RN.  R UE movement generally limited to shoulder height (due to pain); strength and ROM otherwise (L UE, bilat LEs) grossly WFL for basic transfers and mobility.  Currently requiring min assist for sit/stand with RW; cga for gait (61') with RW.  Very slow and deliberate with all movement activities due to pain in R UE.  Gait pattern demonstrates reciprocal stepping pattern with fair step height/length; mild forward trunk flexion. No overt buckling or LOB; tolerating R UE placement/grasp on RW without difficulty (or increase in pain).  Do recommend continued use of RW for optimal safety/stability at this point; patient informed/aware. Of note, patient on 3L supplemental O2 upon arrival to session, sats >93%.  Attempted wean to 2L with noted desat to 87-88% during gait trial.  Returned to 3L and left 3L end of session.  RN/MD informed/aware; patient educated in importance of deep breathing. May benefit from incentive spirometer for pulmonary hygiene and chest/lung expansion. Would benefit from skilled PT to address above deficits and promote optimal return to PLOF.; Recommend transition to HHPT upon discharge from acute hospitalization.  Will plan to increase gait  distance and complete stair assessment next session as appropriate.      Recommendations for follow up therapy are one component of a multi-disciplinary discharge planning process, led by the attending physician.  Recommendations may be updated based on patient status, additional functional criteria and insurance authorization.  Follow Up Recommendations Home health PT    Assistance Recommended at Discharge PRN  Functional Status Assessment Patient has had a recent decline in their functional status and demonstrates the ability to make significant improvements in function in a reasonable and predictable amount of time.  Equipment Recommendations  Rolling walker (2 wheels)    Recommendations for Other Services       Precautions / Restrictions Precautions Precautions: Fall Restrictions Weight Bearing Restrictions: No      Mobility  Bed Mobility               General bed mobility comments: seated in recliner beginning/end of session    Transfers Overall transfer level: Needs assistance Equipment used: Rolling walker (2 wheels) Transfers: Sit to/from Stand Sit to Stand: Min assist           General transfer comment: cuing for hand placement to optimize safety and pain control; slow, purposeful movement transitions, generally guarded due to pain    Ambulation/Gait Ambulation/Gait assistance: Min guard Gait Distance (Feet): 40 Feet Assistive device: Rolling walker (2 wheels)         General Gait Details: reciprocal stepping pattern with fair step height/length; mild forward trunk flexion. No overt buckling or LOB; tolerating R UE placement/grasp on RW without difficulty (or increase in pain)  Stairs            Wheelchair Mobility    Modified Rankin (Stroke Patients Only)  Balance Overall balance assessment: Needs assistance Sitting-balance support: No upper extremity supported;Feet supported Sitting balance-Leahy Scale: Good     Standing  balance support: Bilateral upper extremity supported Standing balance-Leahy Scale: Fair                               Pertinent Vitals/Pain Pain Assessment: Faces Faces Pain Scale: Hurts even more Pain Location: R flank/shoulder area Pain Descriptors / Indicators: Discomfort;Dull;Grimacing Pain Intervention(s): Limited activity within patient's tolerance;Monitored during session;Repositioned;Patient requesting pain meds-RN notified;RN gave pain meds during session    [name] expects to be discharged to:: Private residence Living Arrangements: Spouse/significant other Available Help at Discharge: Family Type of Home: House Home Access: Stairs to enter Entrance Stairs-Rails: Chemical engineer of Steps: 3   Home Layout: One level Home Equipment: Conservation officer, nature (2 wheels);Cane - single point      Prior Function Prior Level of Function : Independent/Modified Independent             Mobility Comments: Indep with ADLs, household and community mobilization; intermittent use of SPC as needed.  Denies fall history within previous six months.  No driving (wife drives)       Hand Dominance        Extremity/Trunk Assessment   Upper Extremity Assessment Upper Extremity Assessment:  (active R shoulder approx 70-80 degrees, limited by pain; otherwise, grossly WFL)    Lower Extremity Assessment Lower Extremity Assessment: Overall WFL for tasks assessed (grossly at least 4/5 throughout)       Communication      Cognition Arousal/Alertness: Awake/alert Behavior During Therapy: WFL for tasks assessed/performed Overall Cognitive Status: Within Functional Limits for tasks assessed                                          General Comments      Exercises Other Exercises Other Exercises: Reviewed role of PT and progressive mobility; reviewed use of RW, safety and mechanics with transfers.  Patient voiced  understanding.   Assessment/Plan    PT Assessment Patient needs continued PT services  PT Problem List Pain;Decreased strength;Decreased range of motion;Decreased activity tolerance;Decreased balance;Decreased mobility;Decreased knowledge of use of DME;Decreased knowledge of precautions;Decreased safety awareness;Cardiopulmonary status limiting activity       PT Treatment Interventions DME instruction;Gait training;Stair training;Functional mobility training;Therapeutic activities;Therapeutic exercise;Balance training;Patient/family education    PT Goals (Current goals can be found in the Care Plan section)  Acute Rehab PT Goals Patient Stated Goal: to return home with wife PT Goal Formulation: With patient Time For Goal Achievement: 05/05/21 Potential to Achieve Goals: Good    Frequency Min 2X/week   Barriers to discharge        Co-evaluation               AM-PAC PT "6 Clicks" Mobility  Outcome Measure Help needed turning from your back to your side while in a flat bed without using bedrails?: None Help needed moving from lying on your back to sitting on the side of a flat bed without using bedrails?: A Little Help needed moving to and from a bed to a chair (including a wheelchair)?: A Little Help needed standing up from a chair using your arms (e.g., wheelchair or bedside chair)?: A Little Help needed to walk in hospital room?: A Little Help needed climbing 3-5  steps with a railing? : A Little 6 Click Score: 19    End of Session Equipment Utilized During Treatment: Gait belt Activity Tolerance: Patient tolerated treatment well Patient left: in chair;with call bell/phone within reach Nurse Communication: Mobility status PT Visit Diagnosis: Muscle weakness (generalized) (M62.81);Difficulty in walking, not elsewhere classified (R26.2);History of falling (Z91.81)    Time: 5300-5110 PT Time Calculation (min) (ACUTE ONLY): 28 min   Charges:   PT Evaluation $PT Eval  Moderate Complexity: 1 Mod PT Treatments $Therapeutic Activity: 23-37 mins       Mallori Araque H. Owens Shark, PT, DPT, NCS 04/21/21, 5:04 PM [phone]

## 2021-04-21 NOTE — Progress Notes (Signed)
[facility] [place] NAME: Gilbert [name]    MR#:  [id number]  DATE OF BIRTH:  1940/06/28  SUBJECTIVE:  patient came in after he had a fall getting of the car likely trip on something or his legs twisted fell backwards. Sitting out in the chair present. Eating lunch. No family in the room. Complains of right shoulder pain.  REVIEW OF SYSTEMS:   Review of Systems  Constitutional:  Negative for chills, fever and weight loss.  HENT:  Negative for ear discharge, ear pain and nosebleeds.   Eyes:  Negative for blurred vision, pain and discharge.  Respiratory:  Negative for sputum production, shortness of breath, wheezing and stridor.   Cardiovascular:  Negative for chest pain, palpitations, orthopnea and PND.  Gastrointestinal:  Negative for abdominal pain, diarrhea, nausea and vomiting.  Genitourinary:  Negative for frequency and urgency.  Musculoskeletal:  Positive for falls and joint pain. Negative for back pain.  Neurological:  Positive for weakness. Negative for sensory change, speech change and focal weakness.  Psychiatric/Behavioral:  Negative for depression and hallucinations. The patient is not nervous/anxious.   Tolerating Diet:yes Tolerating PT: HHPT  DRUG ALLERGIES:  No Known Allergies  VITALS:  Blood pressure (!) 173/70, pulse (!) 47, temperature 98.1 F (36.7 C), resp. rate 18, height 5\' 10"  (1.778 m), weight 88 kg, SpO2 91 %.  PHYSICAL EXAMINATION:   Physical Exam  GENERAL:  80 y.o.-year-old patient lying in the bed with no acute distress.  HEENT: Head atraumatic, normocephalic. Oropharynx and nasopharynx clear.  LUNGS: Normal breath sounds bilaterally, no wheezing, rales, rhonchi. No use of accessory muscles of respiration.  CARDIOVASCULAR: S1, S2 normal. No murmurs, rubs, or gallops.  ABDOMEN: Soft, nontender, nondistended. Bowel sounds present. No organomegaly or mass.  EXTREMITIES: No cyanosis, clubbing or edema b/l.    Decrease ROM right shoulder due to contusion/ pain NEUROLOGIC: nonfocal PSYCHIATRIC:  patient is alert and oriented x 3.  SKIN: No obvious rash, lesion, or ulcer.   LABORATORY PANEL:  CBC Recent Labs  Lab 04/20/21 1019  WBC 8.8  HGB 13.0  HCT 39.9  PLT 137*    Chemistries  Recent Labs  Lab 04/20/21 1019  NA 136  K 4.5  CL 101  CO2 29  GLUCOSE 257*  BUN 21  CREATININE 1.36*  CALCIUM 8.2*  AST 23  ALT 7  ALKPHOS 83  BILITOT 0.9   Cardiac Enzymes No results for input(s): TROPONINI in the last 168 hours. RADIOLOGY:  CT Angio Chest PE W and/or Wo Contrast  Result Date: 04/20/2021 CLINICAL DATA:  Right-sided chest pain after a fall. EXAM: CT ANGIOGRAPHY CHEST WITH CONTRAST TECHNIQUE: Multidetector CT imaging of the chest was performed using the standard protocol during bolus administration of intravenous contrast. Multiplanar CT image reconstructions and MIPs were obtained to evaluate the vascular anatomy. CONTRAST:  67mL OMNIPAQUE IOHEXOL 350 MG/ML SOLN COMPARISON:  Chest radiograph of earlier in the day. FINDINGS: Cardiovascular: The quality of this exam for evaluation of pulmonary embolism is good. The bolus is well timed. No evidence of pulmonary embolism. Aortic atherosclerosis. Tortuous thoracic aorta. Moderate cardiomegaly, without pericardial effusion. Left main and 3 vessel coronary artery calcification. Mediastinum/Nodes: Calcified mediastinal and right hilar nodes are related to old granulomatous disease. Lungs/Pleura: No pleural fluid. No pneumothorax. Mild right hemidiaphragm elevation with bibasilar atelectasis in the setting of poor inspiratory effort. Right apical and left lower lobe calcified granulomas. Upper Abdomen: Normal imaged portions of the liver,  spleen, stomach. Musculoskeletal: Remote posteromedial lower left rib fractures. Anterolateral second right rib minimally displaced fracture including on 34/4. Review of the MIP images confirms the above findings.  IMPRESSION: 1. No evidence of pulmonary embolism. 2. Right second rib fracture, minimally displaced. 3. Coronary artery atherosclerosis. Aortic Atherosclerosis (ICD10-I70.0). Electronically Signed   By: Abigail Miyamoto M.D.   On: 04/20/2021 12:09   DG Chest Portable 1 View  Result Date: 04/20/2021 CLINICAL DATA:  Fall.  Hypoxia. EXAM: PORTABLE CHEST 1 VIEW COMPARISON:  12/10/2018 FINDINGS: 1059 hours. Low volume film. Cardiopericardial silhouette is at upper limits of normal for size. Basilar atelectasis bilaterally. No pleural effusion or pulmonary edema. The visualized bony structures of the thorax show no acute abnormality. IMPRESSION: Low volume film with bibasilar atelectasis. Electronically Signed   By: Misty Stanley M.D.   On: 04/20/2021 11:16   ASSESSMENT AND PLAN:    Gilbert TISDALE Sr. is a 80 y.o. male with medical history significant of hypertension, hyperlipidemia, diabetes mellitus, stroke, hypothyroidism, Parkinson's disease, seizure, CKD stage IIIa, who presents with fall and chest pain.  Pt states that he fell accidentally when he was walking home from his car and tripped his steps last night. No LOC. Patient strongly denies any head or neck injury.  Fall and right 2nd rib fracture: Patient strongly denies any head or neck injury.  Refused CT of the head. -fall precaution  -pain control: As needed morphine, Percocet, Tylenol, Lidoderm -PT/OT--recommends HH--PT would like to work with him tomorrow as well   Acute respiratory failure with hypoxia: Patient denies any cough, shortness of breath.  He has oxygen desaturating to 86% on room air, which improved to 92 % on 2 L oxygen.  Patient does not have leg edema or pulm edema chest x-ray, does not seem to have CHF.  CT angiogram is negative for PE.  Patient has right second rib fracture and chest wall pain, which is limiting chest wall movement for normal breathing. -focus on pain control: as above -prn albuterol -Continue nasal  cannula oxygen to maintain oxygen saturation above 93% -check BNP   Hx of CVA (cerebral vascular accident) ([place]) -Aspirin, Lipitor   Seizure disorder (HCC) -Seizure precaution -When necessary Ativan for seizure -Continue Home dose of Depakote   Type II diabetes mellitus with renal manifestations [facility]): Recent A1c 6.9, well controlled.  Patient still taking 70/30 insulin -SSI -resume home dose of insulin 70/30    Elevated troponin: trop 36.  Possibly due to demand ischemia -Continue aspirin, Lipitor  HTN (hypertension):-atenolol, oral hydralazine, lisinopril.   Chronic diastolic CHF: 2D echo on 10/25/7791 showed EF of 55 to 60% with grade 1 diastolic dysfunction.  Patient does not have leg edema or JVD.   well compensated. -Continue home Lasix, 20 mg daily   HLD (hyperlipidemia) -Lipitor   Hypothyroidism -Synthroid   Parkinson disease (HCC) -Sinemet   CKD (chronic kidney disease), stage IIIa: Stable.  Recent baseline creatinine 1.2-1.4.  His creatinine is 1.36, BUN 21   Family communication :none today Consults : CODE STATUS: full DVT Prophylaxis :enoxaparin Level of care: Med-Surg Status is: Inpatient  Remains inpatient appropriate because: fall and rib fracture        TOTAL TIME TAKING CARE OF THIS PATIENT: 25 minutes.  >50% time spent on counselling and coordination of care  Note: This dictation was prepared with Dragon dictation along with smaller phrase technology. Any transcriptional errors that result from this process are unintentional.  Norville Haggard.D  [facility]   CC: Primary care physician; [name], MD Patient ID: Gilbert Finner Sr., male   DOB: March 12, 1941, 80 y.o.   MRN: [id number]

## 2021-04-21 NOTE — TOC Initial Note (Signed)
Transition of Care (TOC) - Initial/Assessment Note    Patient Details  Name: Gilbert KLEMANN Sr. MRN: [id number] Date of Birth: [birth year]  Transition of Care [facility]) CM/SW Contact:    Pete Pelt, RN Phone Number: 04/21/2021, 3:23 PM  Clinical Narrative:    Patient lives at home with spouse.  She is able to assist him at home, with transportation and medications.  She states that her son is installing a ramp at home to assist with ambulation and transportation.  Patient has a rolling walker and BSC at home, wife states he refuses to use the [facility].  Home Health PT OT recommended by therapy, spouse states that patient has had home health with [facility] in the past.  [facility] is now [place], Tiffany at [facility] notified.  Spouse does not anticipate any further needs.  TOC contact information provided, TOC to follow to discharge.               Expected Discharge Plan: [place] Barriers to Discharge: Continued Medical Work up   Patient Goals and CMS Choice     Choice offered to / list presented to : NA  Expected Discharge Plan and Services Expected Discharge Plan: [place]   Discharge Planning Services: CM Consult Post Acute Care Choice: [place] arrangements for the past 2 months: Single Family Home                                      Prior Living Arrangements/Services Living arrangements for the past 2 months: Single Family Home Lives with:: Self, Spouse Patient language and need for interpreter reviewed:: Yes (No interpreter required) Do you feel safe going back to the place where you live?: Yes      Need for Family Participation in Patient Care: Yes (Comment) Care giver support system in place?: Yes (comment)   Criminal Activity/Legal Involvement Pertinent to Current Situation/Hospitalization: No - Comment as needed  Activities of Daily Living Home Assistive Devices/Equipment: Eyeglasses, Walker (specify  type) ADL Screening (condition at time of admission) Patient's cognitive ability adequate to safely complete daily activities?: Yes Is the patient deaf or have difficulty hearing?: No Does the patient have difficulty seeing, even when wearing glasses/contacts?: No Does the patient have difficulty concentrating, remembering, or making decisions?: No Patient able to express need for assistance with ADLs?: Yes Does the patient have difficulty dressing or bathing?: Yes Independently performs ADLs?: No Communication: Independent Dressing (OT): Needs assistance Grooming: Independent Feeding: Independent Bathing: Needs assistance Toileting: Needs assistance In/Out Bed: Needs assistance Walks in Home: Needs assistance Does the patient have difficulty walking or climbing stairs?: Yes Weakness of Legs: Both Weakness of Arms/Hands: None  Permission Sought/Granted Permission sought to share information with : Case Manager Permission granted to share information with : Yes, Verbal Permission Granted     Permission granted to share info w AGENCY: Home health agency, DME agency        Emotional Assessment Appearance:: Appears stated age Attitude/Demeanor/Rapport:  (spoke to wife)   Orientation: : Oriented to Self, Oriented to Place Alcohol / Substance Use: Not Applicable Psych Involvement: No (comment)  Admission diagnosis:  Fall [W19.XXXA] Hypoxia [R09.02] Fall, initial encounter [id number]] Closed fracture of one rib of right side, initial encounter [S22.31XA] Patient Active Problem List   Diagnosis Date Noted   Fall 04/20/2021   Right rib fracture 04/20/2021  Acute respiratory failure with hypoxia ([place]) 04/20/2021   Elevated troponin 04/20/2021   HTN (hypertension) 04/20/2021   HLD (hyperlipidemia) 04/20/2021   Hypothyroidism 04/20/2021   Chronic diastolic CHF (congestive heart failure) ([place]) 04/20/2021   Parkinson disease ([place])    CKD (chronic kidney disease), stage IIIa     Type II diabetes mellitus with renal manifestations ([place]) 06/10/2019   Abnormal findings on diagnostic imaging of gall bladder 06/10/2019   Obstructive jaundice 06/10/2019   Abdominal pain 06/10/2019   History of CVA (cerebrovascular accident) without residual deficits 06/10/2019   Hypertensive urgency 06/10/2019   Hypoglycemia associated with type 2 diabetes mellitus ([place]) 06/10/2019   Dehydration 12/10/2018   Seizure disorder ([place])    Expressive aphasia    CVA (cerebral vascular accident) ([place]) 05/05/2016   PCP:  [name], MD Pharmacy:   [facility], [place] [address] [place] [address] Phone: [phone] Fax: [place], [place] - [address] [place] [address] Phone: [phone] Fax: [place], [place] [address] [place] [address] Phone: [phone] Fax: [phone]     Social Determinants of Health (SDOH) Interventions    Readmission Risk Interventions No flowsheet data found.

## 2021-04-22 DIAGNOSIS — E038 Other specified hypothyroidism: Secondary | ICD-10-CM

## 2021-04-22 DIAGNOSIS — I5032 Chronic diastolic (congestive) heart failure: Secondary | ICD-10-CM | POA: Diagnosis not present

## 2021-04-22 DIAGNOSIS — S2231XA Fracture of one rib, right side, initial encounter for closed fracture: Secondary | ICD-10-CM | POA: Diagnosis not present

## 2021-04-22 DIAGNOSIS — J9601 Acute respiratory failure with hypoxia: Secondary | ICD-10-CM | POA: Diagnosis not present

## 2021-04-22 DIAGNOSIS — I10 Essential (primary) hypertension: Secondary | ICD-10-CM

## 2021-04-22 DIAGNOSIS — E1121 Type 2 diabetes mellitus with diabetic nephropathy: Secondary | ICD-10-CM

## 2021-04-22 DIAGNOSIS — Z794 Long term (current) use of insulin: Secondary | ICD-10-CM

## 2021-04-22 DIAGNOSIS — N183 Chronic kidney disease, stage 3 unspecified: Secondary | ICD-10-CM | POA: Diagnosis not present

## 2021-04-22 LAB — GLUCOSE, CAPILLARY: Glucose-Capillary: 110 mg/dL — ABNORMAL HIGH (ref 70–99)

## 2021-04-22 MED ORDER — HYDRALAZINE HCL 100 MG PO TABS: 100.0000 mg | ORAL_TABLET | Freq: Three times a day (TID) | ORAL | 0 refills | Status: DC

## 2021-04-22 MED ORDER — LIDOCAINE 5 % EX PTCH
1.0000 | MEDICATED_PATCH | CUTANEOUS | 0 refills | Status: DC
Start: 2021-04-22 — End: 2021-04-29

## 2021-04-22 MED ORDER — ACETAMINOPHEN 325 MG PO TABS: 650.0000 mg | ORAL_TABLET | Freq: Four times a day (QID) | ORAL | Status: DC | PRN

## 2021-04-22 MED ORDER — HYDRALAZINE HCL 50 MG PO TABS: 100.0000 mg | ORAL_TABLET | Freq: Three times a day (TID) | ORAL | Status: DC

## 2021-04-22 NOTE — Progress Notes (Signed)
Physical Therapy Treatment Patient Details Name: Gilbert KRIST Sr. MRN: [id number] DOB: 07/31/1940 Today's Date: 04/22/2021   History of Present Illness Gilbert NETO Sr. is a 80 y.o. male with medical history significant of hypertension, hyperlipidemia, diabetes mellitus, stroke, hypothyroidism, Parkinson's disease, seizure, CKD stage IIIa, who presents with fall and chest 04/21/21 Chest CT: Right 2nd rib fracture, minimally displaced.    PT Comments    Patient continues to endorse pain in R shoulder/flank area (due to rib fracture), but learning and implementing compensatory strategies to address.  Continues to require min assist for all functional mobiltiy tasks; tolerating increased gait distance and overall activity level this date.  Able to initiate stair negotiation, min assist for safety; does require handrail on L to safely complete (has handrail available on bilat sides at home), performing in step-to sequence.  Of note, continued with mild desat to 87% on RA during gait trial; requiring 2L supplemental O2 to maintain sats >93% with activity.  SaO2 on room air at rest = 92% SaO2 on room air while ambulating = 87% SaO2 on 2 liters of O2 while ambulating = 93%      Recommendations for follow up therapy are one component of a multi-disciplinary discharge planning process, led by the attending physician.  Recommendations may be updated based on patient status, additional functional criteria and insurance authorization.  Follow Up Recommendations  Home health PT     Assistance Recommended at Discharge PRN  Equipment Recommendations  Rolling walker (2 wheels)    Recommendations for Other Services       Precautions / Restrictions Precautions Precautions: Fall Restrictions Weight Bearing Restrictions: No     Mobility  Bed Mobility Overal bed mobility: Needs Assistance Bed Mobility: Supine to Sit     Supine to sit: Min assist     General bed mobility  comments: requires transition towards L side of bed (for pain control to R UE); min cuing for R UE position/protection (to avoid 'leaving behind' during movement transition); min assist for truncal elevation    Transfers   Equipment used: Rolling walker (2 wheels) Transfers: Sit to/from Stand Sit to Stand: Min assist           General transfer comment: cuing for hand placement to optimize safety and pain control; slow, purposeful movement transitions, generally guarded due to pain    Ambulation/Gait Ambulation/Gait assistance: Min guard Gait Distance (Feet): 175 Feet Assistive device: Rolling walker (2 wheels)         General Gait Details: reciprocal stepping pattern, slightly choppy; limited balance reactions, but no overt buckling or LOB.  Desat to 87% on RA with gait trial, requiring 2L to maintain >92% with gait.  Do recommend continued use of RW for all gait efforts at this time; patient voiced understanding/agreement   Stairs Stairs: Yes Stairs assistance: Min guard;Min assist Stair Management: One rail Left Number of Stairs: 4 General stair comments: step to gait pattern, ascending forward/descending backward (to continuously utilize L rail)   Wheelchair Mobility    Modified Rankin (Stroke Patients Only)       Balance Overall balance assessment: Needs assistance Sitting-balance support: No upper extremity supported Sitting balance-Leahy Scale: Good     Standing balance support: Bilateral upper extremity supported Standing balance-Leahy Scale: Fair                              Cognition Arousal/Alertness: Awake/alert Behavior During Therapy: WFL for  tasks assessed/performed Overall Cognitive Status: Within Functional Limits for tasks assessed                                          Exercises      General Comments        Pertinent Vitals/Pain Pain Assessment: Faces Faces Pain Scale: Hurts even more Pain Location:  R flank/shoulder area Pain Descriptors / Indicators: Discomfort;Dull;Grimacing Pain Intervention(s): Limited activity within patient's tolerance;Monitored during session;Repositioned;Patient requesting pain meds-RN notified    Home Living Family/patient expects to be discharged to:: Private residence Living Arrangements: Spouse/significant other                      Prior Function            PT Goals (current goals can now be found in the care plan section) Acute Rehab PT Goals Patient Stated Goal: to return home with wife PT Goal Formulation: With patient Time For Goal Achievement: 05/05/21 Potential to Achieve Goals: Good Progress towards PT goals: Progressing toward goals    Frequency    Min 2X/week      PT Plan Current plan remains appropriate    Co-evaluation              AM-PAC PT "6 Clicks" Mobility   Outcome Measure  Help needed turning from your back to your side while in a flat bed without using bedrails?: None Help needed moving from lying on your back to sitting on the side of a flat bed without using bedrails?: A Little Help needed moving to and from a bed to a chair (including a wheelchair)?: A Little Help needed standing up from a chair using your arms (e.g., wheelchair or bedside chair)?: A Little Help needed to walk in hospital room?: A Little Help needed climbing 3-5 steps with a railing? : A Little 6 Click Score: 19    End of Session Equipment Utilized During Treatment: Gait belt Activity Tolerance: Patient tolerated treatment well Patient left: in chair;with call bell/phone within reach Nurse Communication: Mobility status PT Visit Diagnosis: Muscle weakness (generalized) (M62.81);Difficulty in walking, not elsewhere classified (R26.2);History of falling (Z91.81)     Time: 6073-7106 PT Time Calculation (min) (ACUTE ONLY): 34 min  Charges:  $Gait Training: 23-37 mins                    Shantana Christon H. Owens Shark, PT, DPT, NCS 04/22/21,  2:01 PM [phone]

## 2021-04-22 NOTE — Progress Notes (Signed)
Mobility Specialist - Progress Note   04/22/21 1159  Mobility  Activity Ambulated in hall  Level of Assistance Standby assist, set-up cues, supervision of patient - no hands on  Assistive Device Front wheel walker  Distance Ambulated (ft) 150 ft  Mobility Ambulated with assistance in hallway  Mobility Response Tolerated well  Mobility performed by Mobility specialist  $Mobility charge 1 Mobility    O2 while resting on RA = 91% O2 while AMB on RA = 86%  O2 while AMB on 1L = 93%   Pt sitting in recliner upon arrival, utilizing RA. Extra time to stand to RW. Ambulated in hallway with supervision. O2 desat to a low of 86% with activity, pt denied SOB. HR 50-60s. Pain with activity, mostly during STS transfers. Pt returned to recliner with all needs in reach, RN notified.    Kathee Delton Mobility Specialist 04/22/21, 12:03 PM

## 2021-04-22 NOTE — Discharge Summary (Signed)
Physician Discharge Summary  Gilbert MCLEAN Sr. LOV:[id number] DOB: Feb 04, 1941 DOA: 04/20/2021  PCP: [name], MD  Admit date: 04/20/2021 Discharge date: 04/22/2021  Admitted From: Home Disposition: Home  Recommendations for Outpatient Follow-up:  Follow up with PCP within 1-2 weeks to evaluate for need for oxygen  Equipment/Devices:oxygen  Discharge Condition:stable CODE STATUS:  Code Status: Prior  Regular healthy diet  Brief/Interim Summary: Pt presented with mechanical fall and subsequent chest pain. It was found that he had a rib fracture on right. He had hypoxia down to 80s ORA but did not complain of respiratory symptoms. He improved to 92% with 2L without baseline requirement.  Patient was not able to wean down to room air with breathing treatments, diuretics. His hypoxia was thought to be related to poor aeration with pain on deep inspiration. Pneumothorax ruled out on xray. He was discharged with home oxygen as he requested to be home with his wife who he helps care for.  All other chronic conditions were treated with home medications.  Discharge Diagnoses:  Principal Problem:   Fall Active Problems:   CVA (cerebral vascular accident) ([place])   Seizure disorder ([place])   Type II diabetes mellitus with renal manifestations ([place])   Right rib fracture   Acute respiratory failure with hypoxia ([facility])   Elevated troponin   HTN (hypertension)   HLD (hyperlipidemia)   Hypothyroidism   Parkinson disease ([facility])   CKD (chronic kidney disease), stage IIIa   Chronic diastolic CHF (congestive heart failure) (H[facility])    Allergies as of 04/22/2021   No Known Allergies      Medication List     STOP taking these medications    amLODipine 10 MG tablet Commonly known as: NORVASC   metoprolol succinate 100 MG 24 hr tablet Commonly known as: TOPROL-XL   pantoprazole 40 MG tablet Commonly known as: PROTONIX       TAKE these medications    acetaminophen 325 MG  tablet Commonly known as: TYLENOL Take 2 tablets (650 mg total) by mouth every 6 (six) hours as needed for mild pain or fever.   aspirin 81 MG EC tablet Take 1 tablet (81 mg total) by mouth daily.   atenolol 50 MG tablet Commonly known as: TENORMIN Take 50 mg by mouth daily.   atorvastatin 80 MG tablet Commonly known as: LIPITOR Take 40 mg by mouth at bedtime.   carbidopa-levodopa 25-100 MG tablet Commonly known as: SINEMET IR Take 1 tablet by mouth 3 (three) times daily.   colchicine 0.6 MG tablet Take 1 tablet by mouth daily as needed.   divalproex 500 MG 24 hr tablet Commonly known as: DEPAKOTE ER Take 500 mg by mouth 2 (two) times daily.   donepezil 10 MG tablet Commonly known as: ARICEPT Take 10 mg by mouth daily.   furosemide 20 MG tablet Commonly known as: LASIX Take 20 mg by mouth daily.   hydrALAZINE 100 MG tablet Commonly known as: APRESOLINE Take 1 tablet (100 mg total) by mouth 3 (three) times daily. What changed:  medication strength how much to take   insulin aspart protamine- aspart (70-30) 100 UNIT/ML injection Commonly known as: NOVOLOG MIX 70/30 Inject 20-45 Units into the skin See admin instructions. Inject 45u under the skin every morning and inject 15u under the skin every night   levothyroxine 75 MCG tablet Commonly known as: SYNTHROID Take 75 mcg by mouth daily before breakfast.   lidocaine 5 % Commonly known as: LIDODERM Place 1 patch onto  the skin daily. Remove & Discard patch within 12 hours or as directed by MD   lisinopril 40 MG tablet Commonly known as: ZESTRIL Take 1 tablet (40 mg total) by mouth at bedtime.   rivastigmine 1.5 MG capsule Commonly known as: EXELON Take 1.5 mg by mouth 2 (two) times daily.   vitamin C 500 MG tablet Commonly known as: ASCORBIC ACID Take 1,000 mg by mouth daily.        No Known Allergies  Consultations: none  Procedures/Studies: CT Angio Chest PE W and/or Wo Contrast  Result Date:  04/20/2021 CLINICAL DATA:  Right-sided chest pain after a fall. EXAM: CT ANGIOGRAPHY CHEST WITH CONTRAST TECHNIQUE: Multidetector CT imaging of the chest was performed using the standard protocol during bolus administration of intravenous contrast. Multiplanar CT image reconstructions and MIPs were obtained to evaluate the vascular anatomy. CONTRAST:  23mL OMNIPAQUE IOHEXOL 350 MG/ML SOLN COMPARISON:  Chest radiograph of earlier in the day. FINDINGS: Cardiovascular: The quality of this exam for evaluation of pulmonary embolism is good. The bolus is well timed. No evidence of pulmonary embolism. Aortic atherosclerosis. Tortuous thoracic aorta. Moderate cardiomegaly, without pericardial effusion. Left main and 3 vessel coronary artery calcification. Mediastinum/Nodes: Calcified mediastinal and right hilar nodes are related to old granulomatous disease. Lungs/Pleura: No pleural fluid. No pneumothorax. Mild right hemidiaphragm elevation with bibasilar atelectasis in the setting of poor inspiratory effort. Right apical and left lower lobe calcified granulomas. Upper Abdomen: Normal imaged portions of the liver, spleen, stomach. Musculoskeletal: Remote posteromedial lower left rib fractures. Anterolateral second right rib minimally displaced fracture including on 34/4. Review of the MIP images confirms the above findings. IMPRESSION: 1. No evidence of pulmonary embolism. 2. Right second rib fracture, minimally displaced. 3. Coronary artery atherosclerosis. Aortic Atherosclerosis (ICD10-I70.0). Electronically Signed   By: Abigail Miyamoto M.D.   On: 04/20/2021 12:09   DG Chest Portable 1 View  Result Date: 04/20/2021 CLINICAL DATA:  Fall.  Hypoxia. EXAM: PORTABLE CHEST 1 VIEW COMPARISON:  12/10/2018 FINDINGS: 1059 hours. Low volume film. Cardiopericardial silhouette is at upper limits of normal for size. Basilar atelectasis bilaterally. No pleural effusion or pulmonary edema. The visualized bony structures of the thorax  show no acute abnormality. IMPRESSION: Low volume film with bibasilar atelectasis. Electronically Signed   By: Misty Stanley M.D.   On: 04/20/2021 11:16    Subjective: Patient denies SOB or DOE. Minimal pain with deep inspiration. Would really like to go home to be with his wife.  Discharge Exam: Vitals:   04/22/21 0748 04/22/21 1159  BP: (!) 179/73 (!) 148/57  Pulse: (!) 54 (!) 50  Resp: 20 16  Temp: 97.8 F (36.6 C) 98.4 F (36.9 C)  SpO2: 95% 93%    General: Pt is alert, awake, not in acute distress Cardiovascular: RRR, S1/S2 +, no rubs, no gallops Respiratory: CTA bilaterally, no wheezing, no rhonchi Abdominal: Soft, NT, ND, bowel sounds + Extremities: no edema, no cyanosis  Labs: Basic Metabolic Panel: Recent Labs  Lab 04/20/21 1019  NA 136  K 4.5  CL 101  CO2 29  GLUCOSE 257*  BUN 21  CREATININE 1.36*  CALCIUM 8.2*   CBC: Recent Labs  Lab 04/20/21 1019  WBC 8.8  NEUTROABS 6.7  HGB 13.0  HCT 39.9  MCV 95.5  PLT 137*    Microbiology Recent Results (from the past 240 hour(s))  Resp Panel by RT-PCR (Flu A&B, Covid) Nasopharyngeal Swab     Status: None   Collection Time: 04/20/21 10:31  AM   Specimen: Nasopharyngeal Swab; Nasopharyngeal(NP) swabs in vial transport medium  Result Value Ref Range Status   SARS Coronavirus 2 by RT PCR NEGATIVE NEGATIVE Final    Comment: (NOTE) SARS-CoV-2 target nucleic acids are NOT DETECTED.  The SARS-CoV-2 RNA is generally detectable in upper respiratory specimens during the acute phase of infection. The lowest concentration of SARS-CoV-2 viral copies this assay can detect is 138 copies/mL. A negative result does not preclude SARS-Cov-2 infection and should not be used as the sole basis for treatment or other patient management decisions. A negative result may occur with  improper specimen collection/handling, submission of specimen other than nasopharyngeal swab, presence of viral mutation(s) within the areas  targeted by this assay, and inadequate number of viral copies(<138 copies/mL). A negative result must be combined with clinical observations, patient history, and epidemiological information. The expected result is Negative.  Fact Sheet for Patients:  EntrepreneurPulse.com.au  Fact Sheet for Healthcare Providers:  IncredibleEmployment.be  This test is no t yet approved or cleared by the [place] FDA and  has been authorized for detection and/or diagnosis of SARS-CoV-2 by FDA under an Emergency Use Authorization (EUA). This EUA will remain  in effect (meaning this test can be used) for the duration of the COVID-19 declaration under Section 564(b)(1) of the Act, 21 U.S.C.section 360bbb-3(b)(1), unless the authorization is terminated  or revoked sooner.       Influenza A by PCR NEGATIVE NEGATIVE Final   Influenza B by PCR NEGATIVE NEGATIVE Final    Comment: (NOTE) The Xpert Xpress SARS-CoV-2/FLU/RSV plus assay is intended as an aid in the diagnosis of influenza from Nasopharyngeal swab specimens and should not be used as a sole basis for treatment. Nasal washings and aspirates are unacceptable for Xpert Xpress SARS-CoV-2/FLU/RSV testing.  Fact Sheet for Patients: EntrepreneurPulse.com.au  Fact Sheet for Healthcare Providers: IncredibleEmployment.be  This test is not yet approved or cleared by the [place] FDA and has been authorized for detection and/or diagnosis of SARS-CoV-2 by FDA under an Emergency Use Authorization (EUA). This EUA will remain in effect (meaning this test can be used) for the duration of the COVID-19 declaration under Section 564(b)(1) of the Act, 21 U.S.C. section 360bbb-3(b)(1), unless the authorization is terminated or revoked.  Performed at [facility], [address] [place], [place] [address]     Time coordinating discharge: Over 35 minutes  Richarda Osmond, MD  [facility] 04/23/2021, 8:07 PM

## 2021-04-22 NOTE — TOC Progression Note (Signed)
Transition of Care (TOC) - Progression Note    Patient Details  Name: Gilbert [name] Sr. MRN: [id number] Date of Birth: January 04, 1941  Transition of Care [facility]) CM/SW [place], RN Phone Number: 04/22/2021, 1:35 PM  Clinical Narrative:   Patient will be discharged today. Home oxygen ordered through [employer], will be delivered to the room, staff will educate patient family.    [facility] will assume PT OT care for patient at home.  Care will begin 28 Apr 2020.  Care team, MD and patient's family notified of start of care date.  Patient has rolling walker and bedside commode at home, verified as per wife.  Wife states she does not anticipate any further needs at this time.    Expected Discharge Plan: [place] Barriers to Discharge: Continued Medical Work up  Expected Discharge Plan and Services Expected Discharge Plan: [place]   Discharge Planning Services: CM Consult Post Acute Care Choice: [place] arrangements for the past 2 months: Single Family Home Expected Discharge Date: 04/22/21                                     Social Determinants of Health (SDOH) Interventions    Readmission Risk Interventions No flowsheet data found.

## 2021-04-25 ENCOUNTER — Other Ambulatory Visit: Payer: Self-pay

## 2021-04-25 ENCOUNTER — Inpatient Hospital Stay
Admission: EM | Admit: 2021-04-25 | Discharge: 2021-04-29 | DRG: 683 | Disposition: A | Discharge status: Skilled Nursing Facility | Payer: HMO | Attending: Internal Medicine | Admitting: Internal Medicine

## 2021-04-25 ENCOUNTER — Emergency Department: Payer: HMO

## 2021-04-25 DIAGNOSIS — N1831 Chronic kidney disease, stage 3a: Secondary | ICD-10-CM | POA: Diagnosis present

## 2021-04-25 DIAGNOSIS — R778 Other specified abnormalities of plasma proteins: Secondary | ICD-10-CM | POA: Diagnosis present

## 2021-04-25 DIAGNOSIS — Z743 Need for continuous supervision: Secondary | ICD-10-CM | POA: Diagnosis not present

## 2021-04-25 DIAGNOSIS — I10 Essential (primary) hypertension: Secondary | ICD-10-CM | POA: Diagnosis not present

## 2021-04-25 DIAGNOSIS — Z20822 Contact with and (suspected) exposure to covid-19: Secondary | ICD-10-CM | POA: Diagnosis present

## 2021-04-25 DIAGNOSIS — E785 Hyperlipidemia, unspecified: Secondary | ICD-10-CM | POA: Diagnosis present

## 2021-04-25 DIAGNOSIS — Z808 Family history of malignant neoplasm of other organs or systems: Secondary | ICD-10-CM

## 2021-04-25 DIAGNOSIS — S2231XD Fracture of one rib, right side, subsequent encounter for fracture with routine healing: Secondary | ICD-10-CM | POA: Diagnosis not present

## 2021-04-25 DIAGNOSIS — D696 Thrombocytopenia, unspecified: Secondary | ICD-10-CM | POA: Diagnosis present

## 2021-04-25 DIAGNOSIS — F0280 Dementia in other diseases classified elsewhere without behavioral disturbance: Secondary | ICD-10-CM | POA: Diagnosis present

## 2021-04-25 DIAGNOSIS — Z8673 Personal history of transient ischemic attack (TIA), and cerebral infarction without residual deficits: Secondary | ICD-10-CM

## 2021-04-25 DIAGNOSIS — Z28310 Unvaccinated for covid-19: Secondary | ICD-10-CM | POA: Diagnosis not present

## 2021-04-25 DIAGNOSIS — N183 Chronic kidney disease, stage 3 unspecified: Secondary | ICD-10-CM | POA: Diagnosis present

## 2021-04-25 DIAGNOSIS — R296 Repeated falls: Secondary | ICD-10-CM | POA: Diagnosis present

## 2021-04-25 DIAGNOSIS — E038 Other specified hypothyroidism: Secondary | ICD-10-CM | POA: Diagnosis not present

## 2021-04-25 DIAGNOSIS — I69320 Aphasia following cerebral infarction: Secondary | ICD-10-CM

## 2021-04-25 DIAGNOSIS — Z7982 Long term (current) use of aspirin: Secondary | ICD-10-CM

## 2021-04-25 DIAGNOSIS — E1122 Type 2 diabetes mellitus with diabetic chronic kidney disease: Secondary | ICD-10-CM | POA: Diagnosis present

## 2021-04-25 DIAGNOSIS — R531 Weakness: Secondary | ICD-10-CM | POA: Diagnosis not present

## 2021-04-25 DIAGNOSIS — G40909 Epilepsy, unspecified, not intractable, without status epilepticus: Secondary | ICD-10-CM | POA: Diagnosis present

## 2021-04-25 DIAGNOSIS — N189 Chronic kidney disease, unspecified: Secondary | ICD-10-CM | POA: Diagnosis not present

## 2021-04-25 DIAGNOSIS — N179 Acute kidney failure, unspecified: Secondary | ICD-10-CM

## 2021-04-25 DIAGNOSIS — S2231XA Fracture of one rib, right side, initial encounter for closed fracture: Secondary | ICD-10-CM | POA: Diagnosis present

## 2021-04-25 DIAGNOSIS — Z9981 Dependence on supplemental oxygen: Secondary | ICD-10-CM | POA: Diagnosis not present

## 2021-04-25 DIAGNOSIS — R5381 Other malaise: Secondary | ICD-10-CM | POA: Diagnosis not present

## 2021-04-25 DIAGNOSIS — E039 Hypothyroidism, unspecified: Secondary | ICD-10-CM | POA: Diagnosis present

## 2021-04-25 DIAGNOSIS — Z79899 Other long term (current) drug therapy: Secondary | ICD-10-CM | POA: Diagnosis not present

## 2021-04-25 DIAGNOSIS — I459 Conduction disorder, unspecified: Secondary | ICD-10-CM | POA: Diagnosis not present

## 2021-04-25 DIAGNOSIS — S2249XB Multiple fractures of ribs, unspecified side, initial encounter for open fracture: Secondary | ICD-10-CM | POA: Diagnosis not present

## 2021-04-25 DIAGNOSIS — I129 Hypertensive chronic kidney disease with stage 1 through stage 4 chronic kidney disease, or unspecified chronic kidney disease: Secondary | ICD-10-CM | POA: Diagnosis present

## 2021-04-25 DIAGNOSIS — R4701 Aphasia: Secondary | ICD-10-CM | POA: Diagnosis not present

## 2021-04-25 DIAGNOSIS — R001 Bradycardia, unspecified: Secondary | ICD-10-CM | POA: Diagnosis present

## 2021-04-25 DIAGNOSIS — G20 Parkinson's disease: Secondary | ICD-10-CM | POA: Diagnosis present

## 2021-04-25 DIAGNOSIS — W19XXXA Unspecified fall, initial encounter: Secondary | ICD-10-CM | POA: Diagnosis present

## 2021-04-25 DIAGNOSIS — Z794 Long term (current) use of insulin: Secondary | ICD-10-CM

## 2021-04-25 DIAGNOSIS — K219 Gastro-esophageal reflux disease without esophagitis: Secondary | ICD-10-CM | POA: Diagnosis present

## 2021-04-25 DIAGNOSIS — W19XXXD Unspecified fall, subsequent encounter: Secondary | ICD-10-CM | POA: Diagnosis not present

## 2021-04-25 DIAGNOSIS — E7800 Pure hypercholesterolemia, unspecified: Secondary | ICD-10-CM | POA: Diagnosis not present

## 2021-04-25 DIAGNOSIS — Z79890 Hormone replacement therapy (postmenopausal): Secondary | ICD-10-CM

## 2021-04-25 DIAGNOSIS — R569 Unspecified convulsions: Secondary | ICD-10-CM | POA: Diagnosis present

## 2021-04-25 DIAGNOSIS — F02818 Dementia in other diseases classified elsewhere, unspecified severity, with other behavioral disturbance: Secondary | ICD-10-CM | POA: Diagnosis not present

## 2021-04-25 DIAGNOSIS — E860 Dehydration: Secondary | ICD-10-CM | POA: Diagnosis present

## 2021-04-25 HISTORY — DX: Acute kidney failure, unspecified: N18.9

## 2021-04-25 HISTORY — DX: Chronic kidney disease, unspecified: N17.9

## 2021-04-25 LAB — URINALYSIS, COMPLETE (UACMP) WITH MICROSCOPIC
Bacteria, UA: NONE SEEN
Bilirubin Urine: NEGATIVE
Glucose, UA: 50 mg/dL — AB
Hgb urine dipstick: NEGATIVE
Ketones, ur: 5 mg/dL — AB
Leukocytes,Ua: NEGATIVE
Nitrite: NEGATIVE
Protein, ur: NEGATIVE mg/dL
Specific Gravity, Urine: 1.025 (ref 1.005–1.030)
pH: 6 (ref 5.0–8.0)

## 2021-04-25 LAB — BASIC METABOLIC PANEL
Anion gap: 10 (ref 5–15)
BUN: 55 mg/dL — ABNORMAL HIGH (ref 8–23)
CO2: 27 mmol/L (ref 22–32)
Calcium: 8.6 mg/dL — ABNORMAL LOW (ref 8.9–10.3)
Chloride: 100 mmol/L (ref 98–111)
Creatinine, Ser: 2.45 mg/dL — ABNORMAL HIGH (ref 0.61–1.24)
GFR, Estimated: 26 mL/min — ABNORMAL LOW (ref >60)
Glucose, Bld: 250 mg/dL — ABNORMAL HIGH (ref 70–99)
Potassium: 3.6 mmol/L (ref 3.5–5.1)
Sodium: 137 mmol/L (ref 135–145)

## 2021-04-25 LAB — GLUCOSE, CAPILLARY: Glucose-Capillary: 216 mg/dL — ABNORMAL HIGH (ref 70–99)

## 2021-04-25 LAB — CBC
HCT: 42.1 % (ref 39.0–52.0)
Hemoglobin: 13.5 g/dL (ref 13.0–17.0)
MCH: 31.3 pg (ref 26.0–34.0)
MCHC: 32.1 g/dL (ref 30.0–36.0)
MCV: 97.5 fL (ref 80.0–100.0)
Platelets: 195 10*3/uL (ref 150–400)
RBC: 4.32 MIL/uL (ref 4.22–5.81)
RDW: 13.8 % (ref 11.5–15.5)
WBC: 8.3 10*3/uL (ref 4.0–10.5)
nRBC: 0 % (ref 0.0–0.2)

## 2021-04-25 LAB — RESP PANEL BY RT-PCR (FLU A&B, COVID) ARPGX2
Influenza A by PCR: NEGATIVE
Influenza B by PCR: NEGATIVE
SARS Coronavirus 2 by RT PCR: NEGATIVE

## 2021-04-25 LAB — TROPONIN I (HIGH SENSITIVITY)
Troponin I (High Sensitivity): 38 ng/L — ABNORMAL HIGH (ref <18)
Troponin I (High Sensitivity): 36 ng/L — ABNORMAL HIGH (ref <18)

## 2021-04-25 LAB — CK: Total CK: 67 U/L (ref 49–397)

## 2021-04-25 MED ORDER — ONDANSETRON HCL 4 MG PO TABS: 4.0000 mg | ORAL_TABLET | Freq: Four times a day (QID) | ORAL | Status: DC | PRN

## 2021-04-25 MED ORDER — ACETAMINOPHEN 325 MG PO TABS: 650.0000 mg | ORAL_TABLET | Freq: Once | ORAL | Status: DC

## 2021-04-25 MED ORDER — ONDANSETRON HCL 4 MG/2ML IJ SOLN: 4.0000 mg | Freq: Four times a day (QID) | INTRAMUSCULAR | Status: DC | PRN

## 2021-04-25 MED ORDER — HEPARIN SODIUM (PORCINE) 5000 UNIT/ML IJ SOLN
5000.0000 [IU] | Freq: Three times a day (TID) | INTRAMUSCULAR | Status: DC
  Administered 2021-04-25 – 2021-04-29 (×12): 5000 [IU] via SUBCUTANEOUS
  Filled 2021-04-25 (×12): qty 1

## 2021-04-25 MED ORDER — ACETAMINOPHEN 650 MG RE SUPP: 650.0000 mg | Freq: Four times a day (QID) | RECTAL | Status: DC | PRN

## 2021-04-25 MED ORDER — ASPIRIN EC 81 MG PO TBEC
81.0000 mg | DELAYED_RELEASE_TABLET | Freq: Every day | ORAL | Status: DC
  Administered 2021-04-26 – 2021-04-29 (×4): 81 mg via ORAL
  Filled 2021-04-25 (×4): qty 1

## 2021-04-25 MED ORDER — HYDRALAZINE HCL 50 MG PO TABS
100.0000 mg | ORAL_TABLET | Freq: Three times a day (TID) | ORAL | Status: DC
  Administered 2021-04-25 – 2021-04-29 (×11): 100 mg via ORAL
  Filled 2021-04-25 (×11): qty 2

## 2021-04-25 MED ORDER — CARBIDOPA-LEVODOPA 25-100 MG PO TABS
1.0000 | ORAL_TABLET | Freq: Three times a day (TID) | ORAL | Status: DC
Start: 2021-04-25 — End: 2021-04-29
  Administered 2021-04-25 – 2021-04-29 (×11): 1 via ORAL
  Filled 2021-04-25 (×12): qty 1

## 2021-04-25 MED ORDER — ACETAMINOPHEN 500 MG PO TABS
1000.0000 mg | ORAL_TABLET | Freq: Four times a day (QID) | ORAL | Status: DC | PRN
  Administered 2021-04-26 – 2021-04-28 (×3): 1000 mg via ORAL
  Filled 2021-04-25 (×3): qty 2

## 2021-04-25 MED ORDER — MORPHINE SULFATE (PF) 2 MG/ML IV SOLN: 0.5000 mg | INTRAVENOUS | Status: DC | PRN

## 2021-04-25 MED ORDER — INSULIN ASPART 100 UNIT/ML IJ SOLN
0.0000 [IU] | Freq: Every day | INTRAMUSCULAR | Status: DC
  Administered 2021-04-25: 2 [IU] via SUBCUTANEOUS
  Administered 2021-04-27: 21:00:00 3 [IU] via SUBCUTANEOUS
  Administered 2021-04-28: 2 [IU] via SUBCUTANEOUS
  Filled 2021-04-25 (×3): qty 1

## 2021-04-25 MED ORDER — LORAZEPAM 2 MG/ML IJ SOLN: 2.0000 mg | INTRAMUSCULAR | Status: DC | PRN

## 2021-04-25 MED ORDER — ASCORBIC ACID 500 MG PO TABS
1000.0000 mg | ORAL_TABLET | Freq: Every day | ORAL | Status: DC
  Administered 2021-04-25 – 2021-04-29 (×5): 1000 mg via ORAL
  Filled 2021-04-25 (×5): qty 2

## 2021-04-25 MED ORDER — LACTATED RINGERS IV SOLN: INTRAVENOUS | Status: AC

## 2021-04-25 MED ORDER — DILTIAZEM HCL 25 MG/5ML IV SOLN: 10.0000 mg | INTRAVENOUS | Status: DC | PRN

## 2021-04-25 MED ORDER — DILTIAZEM HCL 25 MG/5ML IV SOLN
10.0000 mg | INTRAVENOUS | Status: AC | PRN
  Filled 2021-04-25: qty 5

## 2021-04-25 MED ORDER — LEVOTHYROXINE SODIUM 50 MCG PO TABS
75.0000 ug | ORAL_TABLET | Freq: Every day | ORAL | Status: DC
  Administered 2021-04-26 – 2021-04-29 (×4): 75 ug via ORAL
  Filled 2021-04-25 (×4): qty 1

## 2021-04-25 MED ORDER — MORPHINE SULFATE (PF) 2 MG/ML IV SOLN
2.0000 mg | Freq: Once | INTRAVENOUS | Status: AC
  Administered 2021-04-25: 2 mg via INTRAVENOUS
  Filled 2021-04-25: qty 1

## 2021-04-25 MED ORDER — ACETAMINOPHEN 500 MG PO TABS: 1000.0000 mg | ORAL_TABLET | Freq: Four times a day (QID) | ORAL | Status: DC | PRN

## 2021-04-25 MED ORDER — INSULIN ASPART 100 UNIT/ML IJ SOLN
0.0000 [IU] | Freq: Three times a day (TID) | INTRAMUSCULAR | Status: DC
  Administered 2021-04-26: 08:00:00 2 [IU] via SUBCUTANEOUS
  Administered 2021-04-26 (×2): 3 [IU] via SUBCUTANEOUS
  Administered 2021-04-27 (×2): 2 [IU] via SUBCUTANEOUS
  Administered 2021-04-27: 3 [IU] via SUBCUTANEOUS
  Administered 2021-04-28: 5 [IU] via SUBCUTANEOUS
  Administered 2021-04-28: 09:00:00 2 [IU] via SUBCUTANEOUS
  Administered 2021-04-28: 5 [IU] via SUBCUTANEOUS
  Administered 2021-04-29: 2 [IU] via SUBCUTANEOUS
  Administered 2021-04-29: 13:00:00 3 [IU] via SUBCUTANEOUS
  Filled 2021-04-25 (×8): qty 1

## 2021-04-25 MED ORDER — ACETAMINOPHEN 325 MG RE SUPP: 650.0000 mg | Freq: Four times a day (QID) | RECTAL | Status: DC | PRN

## 2021-04-25 MED ORDER — LIDOCAINE 5 % EX PTCH
1.0000 | MEDICATED_PATCH | CUTANEOUS | Status: AC
  Administered 2021-04-25 – 2021-04-28 (×4): 1 via TRANSDERMAL
  Filled 2021-04-25 (×4): qty 1

## 2021-04-25 MED ORDER — SODIUM CHLORIDE 0.9 % IV BOLUS
500.0000 mL | Freq: Once | INTRAVENOUS | Status: AC
  Administered 2021-04-25: 500 mL via INTRAVENOUS

## 2021-04-25 MED ORDER — HYDROCODONE-ACETAMINOPHEN 5-325 MG PO TABS
1.0000 | ORAL_TABLET | Freq: Four times a day (QID) | ORAL | Status: DC | PRN
  Administered 2021-04-28: 09:00:00 1 via ORAL
  Filled 2021-04-25: qty 1

## 2021-04-25 MED ORDER — DIVALPROEX SODIUM ER 500 MG PO TB24
500.0000 mg | ORAL_TABLET | Freq: Two times a day (BID) | ORAL | Status: DC
  Administered 2021-04-25 – 2021-04-29 (×8): 500 mg via ORAL
  Filled 2021-04-25 (×9): qty 1

## 2021-04-25 MED ORDER — HYDROCODONE-ACETAMINOPHEN 5-325 MG PO TABS: 1.0000 | ORAL_TABLET | Freq: Four times a day (QID) | ORAL | Status: DC | PRN

## 2021-04-25 MED ORDER — RIVASTIGMINE TARTRATE 1.5 MG PO CAPS
1.5000 mg | ORAL_CAPSULE | Freq: Two times a day (BID) | ORAL | Status: DC
  Administered 2021-04-25 – 2021-04-29 (×8): 1.5 mg via ORAL
  Filled 2021-04-25 (×10): qty 1

## 2021-04-25 MED ORDER — DONEPEZIL HCL 5 MG PO TABS
10.0000 mg | ORAL_TABLET | Freq: Every day | ORAL | Status: DC
  Administered 2021-04-26 – 2021-04-29 (×4): 10 mg via ORAL
  Filled 2021-04-25 (×4): qty 2

## 2021-04-25 MED ORDER — ATORVASTATIN CALCIUM 20 MG PO TABS
40.0000 mg | ORAL_TABLET | Freq: Every day | ORAL | Status: DC
  Administered 2021-04-25 – 2021-04-28 (×4): 40 mg via ORAL
  Filled 2021-04-25 (×4): qty 2

## 2021-04-25 MED ORDER — ATENOLOL 50 MG PO TABS
50.0000 mg | ORAL_TABLET | Freq: Every day | ORAL | Status: DC
  Administered 2021-04-26 – 2021-04-29 (×4): 50 mg via ORAL
  Filled 2021-04-25 (×4): qty 1

## 2021-04-25 NOTE — ED Notes (Signed)
First Nurse Note:  Patient with recent discharge from hospital for fall on Sunday. Patient c/o continued rib pain after dx fracture right ribs. Patient on home oxygen. [name] reportedly cannot take care of him. EMS vitals: 140/100, HR 59 Bpm.

## 2021-04-25 NOTE — ED Notes (Signed)
Family and patient updated on plan of care.

## 2021-04-25 NOTE — H&P (Signed)
History and Physical   Gilbert SOM Sr. WYO:[id number] DOB: 1940/10/29 DOA: 04/25/2021  PCP: [name], MD  Outpatient Specialists: Dr. Melrose Nakayama Patient coming from: Home via EMS  I have personally briefly reviewed patient's old medical records in [place].  Chief Concern: Poor p.o. intake/fall  HPI: Gilbert MOGEL Sr. is a 80 y.o. male with medical history significant for history of seizures, hypertension, hypothyroid, atypical Parkinson's, cognitive impairment, hyperlipidemia, recent right rib fracture, recent hospitalization for fall, history of CVA with residual deficits of expressive aphasia, CKD 3A, who presents emergency department for chief concerns of family's inability to care for patient.  At bedside patient is able to tell me his full name, his age, and that he is in the hospital.  He is able to identify his granddaughter by name at bedside.  He reports that he is not hurting anymore and that he is not having any shortness of breath.  He states that nothing is bothering him.  He denies any fever, abdominal pain.  Per family, on the day he was discharge, that evening, he was sitting on a chair and when he got up he fell and slept on the floor all night. He has fallen an additional one more time since being discharge.   Patient's granddaughter and son do not feel that patient's spouse can care for him.  Patient spouse is 53 years old and has mild dementia as well.  Social history: Patient lives at home with his spouse. He denies tobacco, etoh, recreational drug use. He is retired and formerly worked in Therapist, occupational.  Vaccination history: He is not vaccinated for covid 19.  ROS: Constitutional: no weight change, no fever ENT/Mouth: no sore throat, no rhinorrhea Eyes: no eye pain, no vision changes Cardiovascular: no chest pain, no dyspnea,  no edema, no palpitations Respiratory: no cough, no sputum, no wheezing Gastrointestinal: no nausea, no vomiting, no  diarrhea, no constipation Genitourinary: no urinary incontinence, no dysuria, no hematuria Musculoskeletal: no arthralgias, + myalgias Skin: no skin lesions, no pruritus Neuro: + weakness, no loss of consciousness, no syncope Psych: no anxiety, no depression, + decrease appetite Heme/Lymph: no bruising, no bleeding  ED Course: Discussed with emergency medicine provider, patient requiring hospitalization for chief concerns of acute kidney injury and SNF placement.  Vitals in the emergency department showed temperature of 98, respiration rate of 18, heart rate of 55, blood pressure 144/51, SPO2 of 96% on 2 L nasal cannula.  Serum sodium showed 137, potassium 3.6, chloride 100, bicarb 27, BUN of 55, serum creatinine of 2.45, GFR of 26, nonfasting blood glucose 250, WBC 8.3, hemoglobin 13.5, platelets 195.  In the emergency department patient was given Tylenol 650 mg p.o., morphine 2 mg IV, sodium chloride 500 mL bolus.  Assessment/Plan  Principal Problem:   AKI (acute kidney injury) ([place]) Active Problems:   Expressive aphasia   Seizure disorder ([place])   History of CVA (cerebrovascular accident) without residual deficits   Fall   Right rib fracture   Elevated troponin   HTN (hypertension)   HLD (hyperlipidemia)   Hypothyroidism   CKD (chronic kidney disease), stage IIIa   # Acute kidney injury on CKD 3A, presumed secondary to poor p.o. intake resulting in prerenal/dehydration - Status post sodium chloride 500 mL bolus per EDP - Lactated ringer 125 mL/h, 1 day ordered - BMP in the a.m. - Admit to MedSurg, observation, no telemetry  # Second right rib fracture that is minimally displaced-presumed secondary to  fall on 04/20/2021 - Pain management with lidocaine patch 5%, 1 patch transdermal every 24 hours - Pain management with acetaminophen 1000 mg every 6hours as needed for mild pain; Norco 5 mg every 6 hours as needed for moderate pain; morphine 0.5 mg IV every 3 hours as needed  for severe pain, 2 days ordered - Fall precautions  # Insulin-dependent diabetes mellitus - Patient home sliding scale regimen has not been resumed - Insulin SSI with at bedtime coverage has been ordered for renal dosing due to acute kidney injury on presentation - Goal inpatient blood glucose level is 140-180  # History of CVA Expressive aphasia - Resumed aspirin 81 mg daily and atorvastatin 40 mg nightly  # History of seizures-resumed home Depakote 500 mg p.o. twice daily - Ativan 2 mg IV as needed for breakthrough seizures, 2 doses ordered with instructions for RN to give medications and to let provider know  # Atypical Parkinson's-moderately advanced # Query Parkinson's related dementia-resumed donepezil 10 mg nightly, rivastigmine 1.5 mg p.o. twice daily  # History of hypertension-resumed hydralazine 100 mg 3 times daily - Diltiazem 10 mg IV every 4 hours.  For SBP greater than 180, 3 doses ordered - Lisinopril 40 mg nightly has been held due to acute kidney injury - Patient takes atenolol 50 mg p.o. daily - Furosemide 20 mg daily has been held due to acute kidney injury in setting of prerenal  # Sinus bradycardia-chronic  Chart reviewed:   Hospitalization from 04/20/2021 to 04/22/2021: Patient was admitted for right rib fracture resulting in hypoxia in the 80s on room air requiring 2 L nasal cannula to improvement to 92%.  Patient was not able to wean down to room air.  Patient was discharged home with home oxygen as requested with his wife who helps with his care.  At the time of discharge TOC ensured that spouse had everything that was needed in order for patient to return home.  - TOC verified that patient has a rolling walker and bedside commode at home. - TOC verified that wife states she does not anticipate any further needs at this time. - Discharging provider stopped patient amlodipine 10 mg daily, metoprolol succinate 100 mg every 24 hours and GERD 40 mg daily. -  Discharge provider prescribed acetaminophen 650 mg every 6 hours as needed for mild pain or fever, - Home medications were resumed.  Last echo on 05/06/2016: Was read as ejection fraction 55 to 17%, grade 1 diastolic dysfunction.  DVT prophylaxis: Heparin 5000 units subcutaneous every 8 hours Code Status: Full code Diet: Heart healthy/carb modified Family Communication: Updated Brooke, granddaughter at bedside  Disposition Plan: Pending clinical course; pending SNF placement per patient and family request Consults called: TOC, PT, OT Admission status: Telemetry medical, observation  Past Medical History:  Diagnosis Date   Choledocholithiasis    Chronic cholecystitis    Diabetes mellitus without complication ([place])    Hypertension    Parkinson disease ([place])    Stroke ([place])    Past Surgical History:  Procedure Laterality Date   ELBOW SURGERY     ENDOSCOPIC RETROGRADE CHOLANGIOPANCREATOGRAPHY (ERCP) WITH PROPOFOL N/A 06/12/2019   Procedure: ENDOSCOPIC RETROGRADE CHOLANGIOPANCREATOGRAPHY (ERCP) WITH PROPOFOL;  Surgeon: Lucilla Lame, MD;  Location: [facility] ENDOSCOPY;  Service: Endoscopy;  Laterality: N/A;   ERCP N/A 07/10/2019   Procedure: ENDOSCOPIC RETROGRADE CHOLANGIOPANCREATOGRAPHY (ERCP) STENT REMOVAL;  Surgeon: Lucilla Lame, MD;  Location: [facility] ENDOSCOPY;  Service: Endoscopy;  Laterality: N/A;   subtotal cholecystectomy  06/13/2019  Social History:  reports that he has never smoked. He has never used smokeless tobacco. He reports that he does not drink alcohol and does not use drugs.  No Known Allergies Family History  Problem Relation Age of Onset   Brain cancer Mother    Prostate cancer Neg Hx    Bladder Cancer Neg Hx    Kidney cancer Neg Hx    Family history: Family history reviewed and not pertinent.  Prior to Admission medications   Medication Sig Start Date End Date Taking? Authorizing Provider  acetaminophen (TYLENOL) 325 MG tablet Take 2 tablets (650 mg total) by mouth  every 6 (six) hours as needed for mild pain or fever. 04/22/21   Richarda Osmond, MD  aspirin EC 81 MG EC tablet Take 1 tablet (81 mg total) by mouth daily. 05/10/16   Gladstone Lighter, MD  atenolol (TENORMIN) 50 MG tablet Take 50 mg by mouth daily.    [provider]  atorvastatin (LIPITOR) 80 MG tablet Take 40 mg by mouth at bedtime.     [provider]  carbidopa-levodopa (SINEMET IR) 25-100 MG tablet Take 1 tablet by mouth 3 (three) times daily. 12/07/18   [provider]  colchicine 0.6 MG tablet Take 1 tablet by mouth daily as needed. 07/04/20   [provider]  divalproex (DEPAKOTE ER) 500 MG 24 hr tablet Take 500 mg by mouth 2 (two) times daily.     [provider]  donepezil (ARICEPT) 10 MG tablet Take 10 mg by mouth daily. 11/23/18   [provider]  furosemide (LASIX) 20 MG tablet Take 20 mg by mouth daily. 03/16/21   [provider]  hydrALAZINE (APRESOLINE) 100 MG tablet Take 1 tablet (100 mg total) by mouth 3 (three) times daily. 04/22/21 05/22/21  Richarda Osmond, MD  insulin aspart protamine- aspart (NOVOLOG MIX 70/30) (70-30) 100 UNIT/ML injection Inject 20-45 Units into the skin See admin instructions. Inject 45u under the skin every morning and inject 15u under the skin every night    [provider]  levothyroxine (SYNTHROID, LEVOTHROID) 75 MCG tablet Take 75 mcg by mouth daily before breakfast.    [provider]  lidocaine (LIDODERM) 5 % Place 1 patch onto the skin daily. Remove & Discard patch within 12 hours or as directed by MD 04/22/21   Richarda Osmond, MD  lisinopril (PRINIVIL,ZESTRIL) 40 MG tablet Take 1 tablet (40 mg total) by mouth at bedtime. 05/10/16   Gladstone Lighter, MD  rivastigmine (EXELON) 1.5 MG capsule Take 1.5 mg by mouth 2 (two) times daily. 03/31/21   [provider]  vitamin C (ASCORBIC ACID) 500 MG tablet Take 1,000 mg by mouth daily.    [provider]   Physical Exam: Vitals:   04/25/21 1655 04/25/21 1830 04/25/21 1900 04/25/21 1926  BP: (!) 144/51 (!) 141/64 (!) 171/59   Pulse: (!) 55 (!) 49 (!) 51   Resp: 18 19 12    Temp: 98 F (36.7 C)   97.6 F (36.4 C)  TempSrc: Oral   Oral  SpO2: 96% 99% 98%   Weight:      Height:       Constitutional: appears age-appropriate, frail, NAD, calm, comfortable Eyes: PERRL, lids and conjunctivae normal ENMT: Mucous membranes are moist. Posterior pharynx clear of any exudate or lesions. Age-appropriate dentition. Hearing appropriate Neck: normal, supple, no masses, no thyromegaly Respiratory: clear to auscultation bilaterally, no wheezing, no crackles. Normal respiratory effort. No accessory muscle  use.  2 L nasal cannula in place. Cardiovascular: Regular rate and rhythm, no murmurs / rubs / gallops. No extremity edema. 2+ pedal pulses. No carotid bruits.  Abdomen: no tenderness, no masses palpated, no hepatosplenomegaly. Bowel sounds positive.  Musculoskeletal: no clubbing / cyanosis. No joint deformity upper and lower extremities. Good ROM, no contractures, no atrophy. Normal muscle tone.  Right-sided chest pain.  Worse with inhalation. Skin: no rashes, lesions, ulcers. No induration Neurologic: Sensation intact. Strength 5/5 in all 4.  Psychiatric: Normal judgment and insight. Alert and oriented x 3. Normal mood.   EKG: independently reviewed, showing sinus bradycardia with rate of 52, QTc of 492, right bundle branch block.  EKG is relatively unchanged when compared to EKGs on 12/10/2018 and 04/20/2021.  Chest x-ray on Admission: I personally reviewed and I agree with radiologist reading as below.  DG Chest 2 View  Result Date: 04/25/2021 CLINICAL DATA:  Pt complains of weakness increased pain in the right side. Low O2 sats in triage. Recent Right 2nd rib fx. Pt reports focal pain at inferior angle of Right scapula. Pt sts no known heart or lung conditions. EXAM: CHEST - 2 VIEW COMPARISON:   04/20/2021 FINDINGS: Cardiac silhouette is normal in size. No mediastinal or hilar masses or evidence adenopathy. Mild linear opacities the lung bases consistent with atelectasis or scarring. Lungs otherwise clear. No pleural effusion or pneumothorax. Skeletal structures are demineralized, grossly intact. IMPRESSION: No active cardiopulmonary disease. Electronically Signed   By: Lajean Manes M.D.   On: 04/25/2021 17:54    Labs on Admission: I have personally reviewed following labs  CBC: Recent Labs  Lab 04/20/21 1019 04/25/21 1701  WBC 8.8 8.3  NEUTROABS 6.7  --   HGB 13.0 13.5  HCT 39.9 42.1  MCV 95.5 97.5  PLT 137* 161   Basic Metabolic Panel: Recent Labs  Lab 04/20/21 1019 04/25/21 1701  NA 136 137  K 4.5 3.6  CL 101 100  CO2 29 27  GLUCOSE 257* 250*  BUN 21 55*  CREATININE 1.36* 2.45*  CALCIUM 8.2* 8.6*   GFR: Estimated Creatinine Clearance: 26 mL/min (A) (by C-G formula based on SCr of 2.45 mg/dL (H)).  Liver Function Tests: Recent Labs  Lab 04/20/21 1019  AST 23  ALT 7  ALKPHOS 83  BILITOT 0.9  PROT 7.3  ALBUMIN 3.4*   Cardiac Enzymes: Recent Labs  Lab 04/25/21 1701  CKTOTAL 67   CBG: Recent Labs  Lab 04/21/21 0732 04/21/21 1126 04/21/21 1534 04/21/21 1952 04/22/21 0751  GLUCAP 189* 203* 224* 122* 110*   Urine analysis:    Component Value Date/Time   COLORURINE AMBER (A) 06/10/2019 1517   APPEARANCEUR CLEAR (A) 06/10/2019 1517   APPEARANCEUR Clear 11/11/2016 1406   LABSPEC 1.016 06/10/2019 1517   PHURINE 6.0 06/10/2019 1517   GLUCOSEU NEGATIVE 06/10/2019 1517   HGBUR NEGATIVE 06/10/2019 1517   BILIRUBINUR NEGATIVE 06/10/2019 1517   BILIRUBINUR Negative 11/11/2016 1406   KETONESUR 5 (A) 06/10/2019 1517   PROTEINUR 100 (A) 06/10/2019 1517   NITRITE NEGATIVE 06/10/2019 1517   LEUKOCYTESUR NEGATIVE 06/10/2019 1517   Dr. Tobie Poet [facility]  If 7PM-7AM, please contact overnight-coverage provider If 7AM-7PM, please contact day  coverage provider [email or web address]  04/25/2021, 7:27 PM

## 2021-04-25 NOTE — ED Notes (Signed)
Patient assisted to use urinal.  150 mL urine output noted

## 2021-04-25 NOTE — ED Provider Notes (Signed)
[facility] Emergency Department Provider Note   ____________________________________________   Event Date/Time   First MD Initiated Contact with Patient 04/25/21 1806     (approximate)  I have reviewed the triage vital signs and the nursing notes.   HISTORY  Chief Complaint Fall    HPI Gilbert LEAVY Sr. is a 80 y.o. male history of a recent right rib fracture.  He was discharged from the hospital recently has been feeling very fatigued and weak.  He had a couple minor falls.  No headache no neck pain  No chest pain except for his right side when he takes a very deep breath, same is not experiencing for a while.  Taking Tylenol as needed at home  Family reports it felt dizzy and dehydrated he does not hardly drink any water at all almost none.  Family concerned that he needs elevated care 90s not able to be cared for reasonably at home  Also his urine seems to have had a slight odor or darkness to it Past Medical History:  Diagnosis Date   Choledocholithiasis    Chronic cholecystitis    Diabetes mellitus without complication ([place])    Hypertension    Parkinson disease ([place])    Stroke [facility])     Patient Active Problem List   Diagnosis Date Noted   Fall 04/20/2021   Right rib fracture 04/20/2021   Acute respiratory failure with hypoxia ([place]) 04/20/2021   Elevated troponin 04/20/2021   HTN (hypertension) 04/20/2021   HLD (hyperlipidemia) 04/20/2021   Hypothyroidism 04/20/2021   Chronic diastolic CHF (congestive heart failure) ([place]) 04/20/2021   Parkinson disease ([place])    CKD (chronic kidney disease), stage IIIa    Type II diabetes mellitus with renal manifestations ([place]) 06/10/2019   Abnormal findings on diagnostic imaging of gall bladder 06/10/2019   Obstructive jaundice 06/10/2019   Abdominal pain 06/10/2019   History of CVA (cerebrovascular accident) without residual deficits 06/10/2019   Hypertensive urgency 06/10/2019    Hypoglycemia associated with type 2 diabetes mellitus ([place]) 06/10/2019   Dehydration 12/10/2018   Seizure disorder ([place])    Expressive aphasia    CVA (cerebral vascular accident) ([place]) 05/05/2016    Past Surgical History:  Procedure Laterality Date   ELBOW SURGERY     ENDOSCOPIC RETROGRADE CHOLANGIOPANCREATOGRAPHY (ERCP) WITH PROPOFOL N/A 06/12/2019   Procedure: ENDOSCOPIC RETROGRADE CHOLANGIOPANCREATOGRAPHY (ERCP) WITH PROPOFOL;  Surgeon: Lucilla Lame, MD;  Location: [facility] ENDOSCOPY;  Service: Endoscopy;  Laterality: N/A;   ERCP N/A 07/10/2019   Procedure: ENDOSCOPIC RETROGRADE CHOLANGIOPANCREATOGRAPHY (ERCP) STENT REMOVAL;  Surgeon: Lucilla Lame, MD;  Location: [facility] ENDOSCOPY;  Service: Endoscopy;  Laterality: N/A;   subtotal cholecystectomy  06/13/2019    Prior to Admission medications   Medication Sig Start Date End Date Taking? Authorizing Provider  acetaminophen (TYLENOL) 325 MG tablet Take 2 tablets (650 mg total) by mouth every 6 (six) hours as needed for mild pain or fever. 04/22/21   Richarda Osmond, MD  aspirin EC 81 MG EC tablet Take 1 tablet (81 mg total) by mouth daily. 05/10/16   Gladstone Lighter, MD  atenolol (TENORMIN) 50 MG tablet Take 50 mg by mouth daily.    [provider]  atorvastatin (LIPITOR) 80 MG tablet Take 40 mg by mouth at bedtime.     [provider]  carbidopa-levodopa (SINEMET IR) 25-100 MG tablet Take 1 tablet by mouth 3 (three) times daily. 12/07/18   [provider]  colchicine 0.6 MG tablet Take 1  tablet by mouth daily as needed. 07/04/20   [provider]  divalproex (DEPAKOTE ER) 500 MG 24 hr tablet Take 500 mg by mouth 2 (two) times daily.     [provider]  donepezil (ARICEPT) 10 MG tablet Take 10 mg by mouth daily. 11/23/18   [provider]  furosemide (LASIX) 20 MG tablet Take 20 mg by mouth daily. 03/16/21   [provider]  hydrALAZINE (APRESOLINE) 100 MG tablet Take 1 tablet (100  mg total) by mouth 3 (three) times daily. 04/22/21 05/22/21  Richarda Osmond, MD  insulin aspart protamine- aspart (NOVOLOG MIX 70/30) (70-30) 100 UNIT/ML injection Inject 20-45 Units into the skin See admin instructions. Inject 45u under the skin every morning and inject 15u under the skin every night    [provider]  levothyroxine (SYNTHROID, LEVOTHROID) 75 MCG tablet Take 75 mcg by mouth daily before breakfast.    [provider]  lidocaine (LIDODERM) 5 % Place 1 patch onto the skin daily. Remove & Discard patch within 12 hours or as directed by MD 04/22/21   Richarda Osmond, MD  lisinopril (PRINIVIL,ZESTRIL) 40 MG tablet Take 1 tablet (40 mg total) by mouth at bedtime. 05/10/16   Gladstone Lighter, MD  rivastigmine (EXELON) 1.5 MG capsule Take 1.5 mg by mouth 2 (two) times daily. 03/31/21   [provider]  vitamin C (ASCORBIC ACID) 500 MG tablet Take 1,000 mg by mouth daily.    [provider]    Allergies Patient has no known allergies.  Family History  Problem Relation Age of Onset   Brain cancer Mother    Prostate cancer Neg Hx    Bladder Cancer Neg Hx    Kidney cancer Neg Hx     Social History Social History   Tobacco Use   Smoking status: Never   Smokeless tobacco: Never  Vaping Use   Vaping Use: Never used  Substance Use Topics   Alcohol use: No   Drug use: No    Review of Systems Constitutional: No fever/chills Eyes: No visual changes. ENT: No sore throat.  Thirsty does not like to drink water Cardiovascular: Denies chest pain except where a "broken rib". Respiratory: Denies shortness of breath. Gastrointestinal: No abdominal pain.   Genitourinary: Negative for dysuria.  Urine has been slightly dark or smell slightly different the last day. Musculoskeletal: Negative for back pain. Skin: Negative for rash. Neurological: Negative for headaches, areas of focal weakness or numbness.  Very  fatigued.    ____________________________________________   PHYSICAL EXAM:  VITAL SIGNS: ED Triage Vitals  Enc Vitals Group     BP 04/25/21 1655 (!) 144/51     Pulse Rate 04/25/21 1655 (!) 55     Resp 04/25/21 1655 18     Temp 04/25/21 1655 98 F (36.7 C)     Temp Source 04/25/21 1655 Oral     SpO2 04/25/21 1655 96 %     Weight 04/25/21 1653 194 lb 0.1 oz (88 kg)     Height 04/25/21 1653 5\' 10"  (1.778 m)     Head Circumference --      Peak Flow --      Pain Score 04/25/21 1652 10     Pain Loc --      Pain Edu? --      Excl. in [place]? --     Constitutional: Alert and oriented. Well appearing and in no acute distress.  Pleasant.  Appears fatigued though. Eyes: Conjunctivae  are normal. Head: Atraumatic. Nose: No congestion/rhinnorhea. Mouth/Throat: Mucous membranes are slightly dry. Neck: No stridor.  Cardiovascular: Normal rate, regular rhythm. Grossly normal heart sounds.  Good peripheral circulation. Respiratory: Normal respiratory effort.  No retractions. Lungs CTAB.  Exhibits a pleuritic pain when he takes a deep breath over the right chest wall. Gastrointestinal: Soft and nontender. No distention. Musculoskeletal: No lower extremity tenderness nor edema. Neurologic:  Normal speech and language. No gross focal neurologic deficits are appreciated.  Skin:  Skin is warm, dry and intact. No rash noted. Psychiatric: Mood and affect are normal. Speech and behavior are normal.  ____________________________________________   LABS (all labs ordered are listed, but only abnormal results are displayed)  Labs Reviewed  BASIC METABOLIC PANEL - Abnormal; Notable for the following components:      Result Value   Glucose, Bld 250 (*)    BUN 55 (*)    Creatinine, Ser 2.45 (*)    Calcium 8.6 (*)    GFR, Estimated 26 (*)    All other components within normal limits  TROPONIN I (HIGH SENSITIVITY) - Abnormal; Notable for the following components:   Troponin I (High Sensitivity) 38  (*)    All other components within normal limits  RESP PANEL BY RT-PCR (FLU A&B, COVID) ARPGX2  CBC  CK  URINALYSIS, ROUTINE W REFLEX MICROSCOPIC  URINALYSIS, COMPLETE (UACMP) WITH MICROSCOPIC  CBG MONITORING, ED  TROPONIN I (HIGH SENSITIVITY)   ____________________________________________  EKG  Reviewed inter by me at 1715 Heart rate 59 QRS 170 QTC 500 Bifascicular block.  Mild nonspecific T wave abnormality.  No evidence of obvious frank ischemia ____________________________________________  RADIOLOGY  DG Chest 2 View  Result Date: 04/25/2021 CLINICAL DATA:  Pt complains of weakness increased pain in the right side. Low O2 sats in triage. Recent Right 2nd rib fx. Pt reports focal pain at inferior angle of Right scapula. Pt sts no known heart or lung conditions. EXAM: CHEST - 2 VIEW COMPARISON:  04/20/2021 FINDINGS: Cardiac silhouette is normal in size. No mediastinal or hilar masses or evidence adenopathy. Mild linear opacities the lung bases consistent with atelectasis or scarring. Lungs otherwise clear. No pleural effusion or pneumothorax. Skeletal structures are demineralized, grossly intact. IMPRESSION: No active cardiopulmonary disease. Electronically Signed   By: Lajean Manes M.D.   On: 04/25/2021 17:54    Chest x-ray reviewed negative for new acute finding.  Atelectasis likely present noted on the right side ____________________________________________   PROCEDURES  Procedure(s) performed: None  Procedures  Critical Care performed: No  ____________________________________________   INITIAL IMPRESSION / ASSESSMENT AND PLAN / ED COURSE  Pertinent labs & imaging results that were available during my care of the patient were reviewed by me and considered in my medical decision making (see chart for details).   , Patient denies any head strike.  Recalls events of falling.  CK normal  Family has had to assist him notably he is not drinking fluids well.  No  associate abdominal pain.  Troponin minimally elevated no chest pain except for pleuritic discomfort which I think is well explained by his known rib fracture  He does have AKI.  No obvious obstructive changes.  Await urinalysis.  Check for UTI, plan to rehydrate provide pain relief with small dose of morphine here and will be monitored carefully on telemetry and pulse oximetry  Discussed with family and patient all understand agreeable with plan for admission for further care and treatment and also likely physical therapy/rehab type planning  Admission discussed with Dr. Tobie Poet     ____________________________________________   FINAL CLINICAL IMPRESSION(S) / ED DIAGNOSES  Final diagnoses:  Fall, initial encounter  AKI (acute kidney injury) ([place])  Dehydration, moderate        Note:  This document was prepared using Systems analyst and may include unintentional dictation errors       Delman Kitten, MD 04/25/21 [phone]

## 2021-04-25 NOTE — ED Notes (Signed)
Patient transported to admission room at this time.  All personal belongings with patient.  Daughter at bedside

## 2021-04-25 NOTE — ED Provider Notes (Signed)
Emergency Medicine Provider Triage Evaluation Note  Gilbert Finner Sr. , a 80 y.o. male  was evaluated in triage.  Pt complains of weakness increased pain in the right side.  Review of Systems  Positive: Weakness, right side pain, low Negative: Vomiting, diarrhea  Physical Exam  Ht 5\' 10"  (1.778 m)    Wt 88 kg    BMI 27.84 kg/m  Gen:   Awake, no distress   Resp:  Normal effort, patient wearing O2 MSK:   Moves extremities without difficulty  Other:    Medical Decision Making  Medically screening exam initiated at 4:57 PM.  Appropriate orders placed.  Gilbert Finner Sr. was informed that the remainder of the evaluation will be completed by another provider, this initial triage assessment does not replace that evaluation, and the importance of remaining in the ED until their evaluation is complete.  Will most likely need admission and possibly placement following.  Patient cannot care for himself at home.  Is trying to wear O2 only when up and about the oxygen is dropping to 85% on room air.   Versie Starks, PA-C 04/25/21 1658    Naaman Plummer, MD 05/01/21 [phone]

## 2021-04-25 NOTE — ED Triage Notes (Signed)
Pt from home, pt recently dcd from hospital after fall, and family says pt has fallen 2 more times since dc. Pt lives with wife, who cannot take care of him and family states pt cannot take care of himself. Family states they have to go to pt's house to help pt. Family states they are hoping to get home health, or rehab. Pt states he still has pain in his right side from fall before recent hospital stay.

## 2021-04-26 ENCOUNTER — Encounter: Payer: Self-pay | Admitting: Internal Medicine

## 2021-04-26 ENCOUNTER — Other Ambulatory Visit: Payer: Self-pay

## 2021-04-26 DIAGNOSIS — E039 Hypothyroidism, unspecified: Secondary | ICD-10-CM | POA: Diagnosis present

## 2021-04-26 DIAGNOSIS — N1831 Chronic kidney disease, stage 3a: Secondary | ICD-10-CM

## 2021-04-26 DIAGNOSIS — I69320 Aphasia following cerebral infarction: Secondary | ICD-10-CM | POA: Diagnosis not present

## 2021-04-26 DIAGNOSIS — F02818 Dementia in other diseases classified elsewhere, unspecified severity, with other behavioral disturbance: Secondary | ICD-10-CM

## 2021-04-26 DIAGNOSIS — E860 Dehydration: Secondary | ICD-10-CM | POA: Diagnosis present

## 2021-04-26 DIAGNOSIS — W19XXXA Unspecified fall, initial encounter: Secondary | ICD-10-CM | POA: Diagnosis present

## 2021-04-26 DIAGNOSIS — R296 Repeated falls: Secondary | ICD-10-CM | POA: Diagnosis present

## 2021-04-26 DIAGNOSIS — E1122 Type 2 diabetes mellitus with diabetic chronic kidney disease: Secondary | ICD-10-CM | POA: Diagnosis present

## 2021-04-26 DIAGNOSIS — Z8673 Personal history of transient ischemic attack (TIA), and cerebral infarction without residual deficits: Secondary | ICD-10-CM | POA: Diagnosis not present

## 2021-04-26 DIAGNOSIS — I129 Hypertensive chronic kidney disease with stage 1 through stage 4 chronic kidney disease, or unspecified chronic kidney disease: Secondary | ICD-10-CM | POA: Diagnosis present

## 2021-04-26 DIAGNOSIS — D696 Thrombocytopenia, unspecified: Secondary | ICD-10-CM | POA: Diagnosis present

## 2021-04-26 DIAGNOSIS — K219 Gastro-esophageal reflux disease without esophagitis: Secondary | ICD-10-CM | POA: Diagnosis present

## 2021-04-26 DIAGNOSIS — E785 Hyperlipidemia, unspecified: Secondary | ICD-10-CM | POA: Diagnosis present

## 2021-04-26 DIAGNOSIS — N179 Acute kidney failure, unspecified: Secondary | ICD-10-CM | POA: Diagnosis present

## 2021-04-26 DIAGNOSIS — Z79899 Other long term (current) drug therapy: Secondary | ICD-10-CM | POA: Diagnosis not present

## 2021-04-26 DIAGNOSIS — R531 Weakness: Secondary | ICD-10-CM | POA: Diagnosis not present

## 2021-04-26 DIAGNOSIS — S2231XA Fracture of one rib, right side, initial encounter for closed fracture: Secondary | ICD-10-CM | POA: Diagnosis present

## 2021-04-26 DIAGNOSIS — R001 Bradycardia, unspecified: Secondary | ICD-10-CM | POA: Diagnosis present

## 2021-04-26 DIAGNOSIS — Z7982 Long term (current) use of aspirin: Secondary | ICD-10-CM | POA: Diagnosis not present

## 2021-04-26 DIAGNOSIS — F0280 Dementia in other diseases classified elsewhere without behavioral disturbance: Secondary | ICD-10-CM | POA: Diagnosis present

## 2021-04-26 DIAGNOSIS — R778 Other specified abnormalities of plasma proteins: Secondary | ICD-10-CM | POA: Diagnosis present

## 2021-04-26 DIAGNOSIS — Z808 Family history of malignant neoplasm of other organs or systems: Secondary | ICD-10-CM | POA: Diagnosis not present

## 2021-04-26 DIAGNOSIS — I10 Essential (primary) hypertension: Secondary | ICD-10-CM | POA: Diagnosis not present

## 2021-04-26 DIAGNOSIS — R569 Unspecified convulsions: Secondary | ICD-10-CM | POA: Diagnosis present

## 2021-04-26 DIAGNOSIS — G20 Parkinson's disease: Secondary | ICD-10-CM

## 2021-04-26 DIAGNOSIS — G40909 Epilepsy, unspecified, not intractable, without status epilepticus: Secondary | ICD-10-CM | POA: Diagnosis present

## 2021-04-26 DIAGNOSIS — Z28310 Unvaccinated for covid-19: Secondary | ICD-10-CM | POA: Diagnosis not present

## 2021-04-26 DIAGNOSIS — Z20822 Contact with and (suspected) exposure to covid-19: Secondary | ICD-10-CM | POA: Diagnosis present

## 2021-04-26 DIAGNOSIS — Z794 Long term (current) use of insulin: Secondary | ICD-10-CM | POA: Diagnosis not present

## 2021-04-26 LAB — CBC
HCT: 35.2 % — ABNORMAL LOW (ref 39.0–52.0)
Hemoglobin: 11.4 g/dL — ABNORMAL LOW (ref 13.0–17.0)
MCH: 31.2 pg (ref 26.0–34.0)
MCHC: 32.4 g/dL (ref 30.0–36.0)
MCV: 96.4 fL (ref 80.0–100.0)
Platelets: 140 10*3/uL — ABNORMAL LOW (ref 150–400)
RBC: 3.65 MIL/uL — ABNORMAL LOW (ref 4.22–5.81)
RDW: 13.7 % (ref 11.5–15.5)
WBC: 6 10*3/uL (ref 4.0–10.5)
nRBC: 0 % (ref 0.0–0.2)

## 2021-04-26 LAB — GLUCOSE, CAPILLARY
Glucose-Capillary: 227 mg/dL — ABNORMAL HIGH (ref 70–99)
Glucose-Capillary: 229 mg/dL — ABNORMAL HIGH (ref 70–99)
Glucose-Capillary: 165 mg/dL — ABNORMAL HIGH (ref 70–99)

## 2021-04-26 LAB — BASIC METABOLIC PANEL
Anion gap: 8 (ref 5–15)
BUN: 40 mg/dL — ABNORMAL HIGH (ref 8–23)
CO2: 26 mmol/L (ref 22–32)
Calcium: 8.2 mg/dL — ABNORMAL LOW (ref 8.9–10.3)
Chloride: 104 mmol/L (ref 98–111)
Creatinine, Ser: 1.49 mg/dL — ABNORMAL HIGH (ref 0.61–1.24)
GFR, Estimated: 47 mL/min — ABNORMAL LOW (ref >60)
Glucose, Bld: 195 mg/dL — ABNORMAL HIGH (ref 70–99)
Potassium: 3.5 mmol/L (ref 3.5–5.1)
Sodium: 138 mmol/L (ref 135–145)

## 2021-04-26 LAB — VITAMIN D 25 HYDROXY (VIT D DEFICIENCY, FRACTURES): Vit D, 25-Hydroxy: 34.84 ng/mL (ref 30–100)

## 2021-04-26 LAB — VITAMIN B12: Vitamin B-12: 278 pg/mL (ref 180–914)

## 2021-04-26 NOTE — Evaluation (Addendum)
Occupational Therapy Evaluation Patient Details Name: Gilbert VANDERVEEN Sr. MRN: [id number] DOB: [birth year] Today's Date: 04/26/2021   History of Present Illness Gilbert FULOP Sr. is a 81 y.o. male with medical history significant for history of seizures, hypertension, hypothyroid, atypical Parkinson's, cognitive impairment, hyperlipidemia, recent right rib fracture, recent hospitalization for fall, history of CVA with residual deficits of expressive aphasia, CKD 3A, who presents emergency department for chief concerns of family's inability to care for patient.   Clinical Impression   Gilbert [name] was seen for OT evaluation this date. Gilbert [name] was recently d/c from hospital, family reports Gilbert [name] requires +1 assist for all transfers and ADLs with multiple falls since d/c home. PTA on 04/21/21 Gilbert [name] required no physical assist and utilized [facility] for mobility. Gilbert [name] lives with wife in home c 3 STE. Gilbert [name] presents to acute OT demonstrating impaired ADL performance and functional mobility 2/2 decreased activity tolerance, poor safety awareness, and functional strength/ROM/balance deficits.   Gilbert [name] currently requires MOD A exit L side of bed. MOD A don/doff underwear - assist for threading over B feet and pulling up over rear in standing, Gilbert [name] demonstrates poor safety awareness by stepping out of underwear in standing. MIN A + RW for standing grooming tasks and functional reaching task - Gilbert [name] utilizes LUE only 2/2 pain limiting RUE AROM. MIN A + RW sit<>stand from bed height, CGA + MAX cues from chair height using B arm rest however decreases to MIN A without cues - poor eccentric control 2/2 pain. Tolerated >100 ft mobility with SBA + RW, SPO2 92% on RA. Gilbert [name] would benefit from skilled OT to address noted impairments and functional limitations (see below for any additional details) in order to maximize safety and independence while minimizing falls risk and caregiver burden. Upon hospital discharge, recommend STR as Gilbert [name] is at high risk  for falls and has already failed attempt to return home due to difficulty with bed mobility/ADLs. Gilbert [name] may return home safely with [place], hospital bed, and 24/7 caregiver assistance.        Recommendations for follow up therapy are one component of a multi-disciplinary discharge planning process, led by the attending physician.  Recommendations may be updated based on patient status, additional functional criteria and insurance authorization.   Follow Up Recommendations  Skilled nursing-short term rehab (<3 hours/day)    Assistance Recommended at Discharge Frequent or constant Supervision/Assistance  Functional Status Assessment  Patient has had a recent decline in their functional status and demonstrates the ability to make significant improvements in function in a reasonable and predictable amount of time.  Equipment Recommendations  Hospital bed    Recommendations for Other Services       Precautions / Restrictions Precautions Precautions: Fall Restrictions Weight Bearing Restrictions: No      Mobility Bed Mobility Overal bed mobility: Needs Assistance Bed Mobility: Supine to Sit     Supine to sit: Mod assist     General bed mobility comments: from flat bed    Transfers Overall transfer level: Needs assistance Equipment used: Rolling walker (2 wheels) Transfers: Sit to/from Stand Sit to Stand: Min assist           General transfer comment: MIN A + RW from bed height, CGA + MAX cues from chair height using B arm rest however decreases to MIN A without cues - poor eccentric control 2/2 pain.      Balance Overall balance assessment: Needs assistance Sitting-balance support: No upper extremity supported Sitting balance-Leahy Scale: Good  Standing balance support: Single extremity supported;During functional activity Standing balance-Leahy Scale: Fair                             ADL either performed or assessed with clinical judgement   ADL  Overall ADL's : Needs assistance/impaired                                       General ADL Comments: MOD A don/doff underwear - assist for threading over B feet and pulling up over rear in standing, Gilbert [name] demonstrates poor safety awareness by stepping out of underwear in standing. MIN A + RW for standing grooming tasks and functional reaching task - Gilbert [name] utilizes LUE only 2/2 pain limiting RUE AROM.      Pertinent Vitals/Pain Pain Assessment: Faces Faces Pain Scale: Hurts even more Pain Location: R flank/shoulder area Pain Descriptors / Indicators: Discomfort;Dull;Grimacing Pain Intervention(s): Limited activity within patient's tolerance;Repositioned     Hand Dominance     Extremity/Trunk Assessment Upper Extremity Assessment Upper Extremity Assessment: Generalized weakness;RUE deficits/detail RUE Deficits / Details: AROM ~90, limited by pain RUE: Unable to fully assess due to pain;Shoulder pain with ROM   Lower Extremity Assessment Lower Extremity Assessment: Generalized weakness       Communication Communication Communication: HOH   Cognition Arousal/Alertness: Awake/alert Behavior During Therapy: WFL for tasks assessed/performed Overall Cognitive Status: Within Functional Limits for tasks assessed                                 General Comments: poor safety awareness     General Comments  Spo2 92 % on RA t/o session    Exercises Exercises: Other exercises Other Exercises Other Exercises: Gilbert [name] and family educated re: OT role, DME recs, d/c recs, falls prevention   Shoulder Instructions      Home Living Family/patient expects to be discharged to:: Private residence Living Arrangements: Spouse/significant other Available Help at Discharge: Family;Available PRN/intermittently Type of Home: House Home Access: Stairs to enter [employer] of Steps: 3 Entrance Stairs-Rails: Left;Right Home Layout: One level                Home Equipment: Conservation officer, nature (2 wheels);Cane - single point          Prior Functioning/Environment Prior Level of Function : Independent/Modified Independent             Mobility Comments: Indep with ADLs, household and community mobilization; intermittent use of SPC as needed.  Denies fall history within previous six months.  No driving (wife drives) ADLs Comments: PLOF for prior to 12/27 hospitalization. Since Gilbert [name] has required assist from family (son/daughter visiting for holidays) for all transfers. Reports 2 falls        OT Problem List: Decreased strength;Decreased range of motion;Decreased activity tolerance;Impaired balance (sitting and/or standing);Decreased safety awareness      OT Treatment/Interventions: Self-care/ADL training;Therapeutic exercise;Energy conservation;DME and/or AE instruction;Therapeutic activities;Patient/family education;Balance training    OT Goals(Current goals can be found in the care plan section) Acute Rehab OT Goals Patient Stated Goal: to go to rehab OT Goal Formulation: With patient/family Time For Goal Achievement: 05/10/21 Potential to Achieve Goals: Good ADL Goals Gilbert [name] Will Perform Grooming: Independently;standing Gilbert [name] Will Perform Lower Body Dressing: with modified independence;sit to/from stand Gilbert [name] Will Transfer to Toilet:  with modified independence;ambulating;regular height toilet (c LRAD PRN)  OT Frequency: Min 2X/week   Barriers to D/C: Decreased caregiver support          Co-evaluation              AM-PAC OT "6 Clicks" Daily Activity     Outcome Measure Help from another person eating meals?: None Help from another person taking care of personal grooming?: A Little Help from another person toileting, which includes using toliet, bedpan, or urinal?: A Lot Help from another person bathing (including washing, rinsing, drying)?: A Lot Help from another person to put on and taking off regular upper body clothing?: A  Little Help from another person to put on and taking off regular lower body clothing?: A Lot 6 Click Score: 16   End of Session Equipment Utilized During Treatment: Gait belt;Rolling walker (2 wheels) Nurse Communication: Mobility status  Activity Tolerance: Patient tolerated treatment well Patient left: in chair;with call bell/phone within reach;with chair alarm set;with family/visitor present  OT Visit Diagnosis: Other abnormalities of gait and mobility (R26.89);Muscle weakness (generalized) (M62.81)                Time: 4799-8721 OT Time Calculation (min): 42 min Charges:  OT General Charges $OT Visit: 1 Visit OT Evaluation $OT Eval Low Complexity: 1 Low OT Treatments $Self Care/Home Management : 23-37 mins  Dessie Coma, M.S. OTR/L  04/26/21, 11:30 AM  ascom [phone]

## 2021-04-26 NOTE — Evaluation (Signed)
Physical Therapy Evaluation Patient Details Name: Gilbert KITTLESON Sr. MRN: [id number] DOB: 11/30/1940 Today's Date: 04/26/2021  History of Present Illness  Pt is an 81 y.o. male presenting to hospital 12/31 with poor p.o. intake and falls.  Recent discharge home from hospital (after fall and recent minimally displaced R 2nd rib fx).  Pt admitted with AKI on CKD, 2nd R rib fx minimally displaced (presumed secondary to fall 04/20/21). PMH includes choledocholithiasis, DM, Parkinson disease, stroke, CHF, h/o CVA, expressive aphasia, elbow sx, sinus bradycardia, and seizures.  Clinical Impression  Prior to recent hospital admissions, pt was ambulatory but has required increased assist since recent hospital discharge home and falls reported; lives with his wife (pt reports his wife is unable to care for him) on main level of home with steps to enter.  Currently pt is mod assist semi-supine to sitting edge of bed; CGA with tranfers; and CGA to ambulate 200 feet with RW.  Per chart, pt, and pt's family--pt has been having difficulty managing at home since recent hospital discharge (even with wife's assist) and has had 1 fall out of lift chair and 1 fall off of couch where he has been sleeping (family has been assisting but unfortunately are only visiting and unable to continue assisting pt).  Pt often joking around during session; decreased safety awareness noted with activities.  Pt would benefit from skilled PT to address noted impairments and functional limitations (see below for any additional details).  Upon hospital discharge, anticipate pt would be able to safely go home if he has 24/7 assist and hospital bed (pt reports his wife is unable to assist and visiting family unable to continue assisting at home)--d/t this, pt would benefit from SNF to improve overall strength and independence with functional mobility (specifically bed mobility and transfers).    Recommendations for follow up therapy are one  component of a multi-disciplinary discharge planning process, led by the attending physician.  Recommendations may be updated based on patient status, additional functional criteria and insurance authorization.  Follow Up Recommendations Skilled nursing-short term rehab (<3 hours/day) ((may be able to go home safely if has 24/7 assist and hospital bed))    Assistance Recommended at Discharge Frequent or constant Supervision/Assistance  Functional Status Assessment Patient has had a recent decline in their functional status and demonstrates the ability to make significant improvements in function in a reasonable and predictable amount of time.  Equipment Recommendations  Rolling walker (2 wheels)    Recommendations for Other Services OT consult     Precautions / Restrictions Precautions Precautions: Fall Restrictions Weight Bearing Restrictions: No      Mobility  Bed Mobility Overal bed mobility: Needs Assistance Bed Mobility: Supine to Sit     Supine to sit: Mod assist     General bed mobility comments: bed flat; vc's for logrolling towards L side (d/t pt unable to get up on his own) and assist for pt's trunk (vc's for overall technique)    Transfers Overall transfer level: Needs assistance Equipment used: Rolling walker (2 wheels);None Transfers: Sit to/from Stand Sit to Stand: Min guard           General transfer comment: fairly strong stand from recliner up to RW (with vc's for UE placement); pt able to stand from bed and walk a few feet bed to recliner with CGA (without use of walker/UE support)    Ambulation/Gait Ambulation/Gait assistance: Min guard Gait Distance (Feet): 200 Feet Assistive device: Rolling walker (2 wheels) Gait  Pattern/deviations: Step-through pattern       General Gait Details: mild flexed posture; steady with RW  Stairs            Wheelchair Mobility    Modified Rankin (Stroke Patients Only)       Balance Overall balance  assessment: Needs assistance Sitting-balance support: No upper extremity supported Sitting balance-Leahy Scale: Good Sitting balance - Comments: steady sitting reaching within BOS   Standing balance support: No upper extremity supported;During functional activity Standing balance-Leahy Scale: Fair Standing balance comment: steady walking short distance bed to recliner (no UE support)                             Pertinent Vitals/Pain Pain Assessment: Faces Faces Pain Scale: Hurts a little bit (2/10 at rest; 4/10 with activity) Pain Location: R scapular/shoulder area Pain Descriptors / Indicators: Discomfort;Grimacing Pain Intervention(s): Limited activity within patient's tolerance;Monitored during session;Repositioned Vitals (HR and O2 on room air) stable and WFL throughout treatment session.    Home Living Family/patient expects to be discharged to:: Private residence Living Arrangements: Spouse/significant other Available Help at Discharge: Family;Available PRN/intermittently Type of Home: House Home Access: Stairs to enter Entrance Stairs-Rails: Right;Left Entrance Stairs-Number of Steps: 5   Home Layout: Two level;Able to live on main level with bedroom/bathroom Home Equipment: Rolling Walker (2 wheels);Cane - single point;BSC/3in1;Grab bars - tub/shower      Prior Function Prior Level of Function : Independent/Modified Independent             Mobility Comments: Prior to recent hospitalizations pt was modified independent ambulating with RW.  Since recent hospital discharge, pt has required increased assist from family (who are visiting for holidays) for all transfers; 2 reported falls (1 out of chair and 1 off couch).  Uses RW in home and [facility] in community.  Pt does not drive (wife drives). ADLs Comments: H/o falls in past couple months.     Hand Dominance        Extremity/Trunk Assessment   Upper Extremity Assessment Upper Extremity Assessment: Defer  to OT evaluation RUE Deficits / Details: per OT eval "AROM ~90, limited by pain" RUE: Unable to fully assess due to pain;Shoulder pain with ROM    Lower Extremity Assessment Lower Extremity Assessment: Generalized weakness    Cervical / Trunk Assessment Cervical / Trunk Assessment: Normal  Communication   Communication: HOH  Cognition Arousal/Alertness: Awake/alert Behavior During Therapy: WFL for tasks assessed/performed Overall Cognitive Status: Within Functional Limits for tasks assessed                                 General Comments: Pt joking around a lot; decreased safety awareness noted        General Comments Nursing cleared pt for participation in physical therapy.  Pt agreeable to PT session.  Pt's granddaughter (and her husband) present during session).     Exercises Bed mobility and transfer training   Assessment/Plan    PT Assessment Patient needs continued PT services  PT Problem List Decreased strength;Decreased balance;Decreased mobility;Decreased activity tolerance;Decreased knowledge of use of DME;Decreased knowledge of precautions;Decreased safety awareness;Pain       PT Treatment Interventions DME instruction;Gait training;Stair training;Functional mobility training;Therapeutic activities;Therapeutic exercise;Balance training;Patient/family education    PT Goals (Current goals can be found in the Care Plan section)  Acute Rehab PT Goals Patient Stated Goal: to improve  pain PT Goal Formulation: With patient Time For Goal Achievement: 05/10/21 Potential to Achieve Goals: Good    Frequency Min 2X/week   Barriers to discharge Decreased caregiver support      Co-evaluation               AM-PAC PT "6 Clicks" Mobility  Outcome Measure Help needed turning from your back to your side while in a flat bed without using bedrails?: None Help needed moving from lying on your back to sitting on the side of a flat bed without using  bedrails?: A Lot Help needed moving to and from a bed to a chair (including a wheelchair)?: A Little Help needed standing up from a chair using your arms (e.g., wheelchair or bedside chair)?: A Little Help needed to walk in hospital room?: A Little Help needed climbing 3-5 steps with a railing? : A Little 6 Click Score: 18    End of Session Equipment Utilized During Treatment: Gait belt Activity Tolerance: Patient tolerated treatment well Patient left: in chair;with call bell/phone within reach;with chair alarm set;with family/visitor present Nurse Communication: Mobility status;Precautions PT Visit Diagnosis: Other abnormalities of gait and mobility (R26.89);Muscle weakness (generalized) (M62.81);History of falling (Z91.81);Repeated falls (R29.6);Pain Pain - Right/Left: Right Pain - part of body: Shoulder    Time: 0076-2263 PT Time Calculation (min) (ACUTE ONLY): 33 min   Charges:   PT Evaluation $PT Eval Low Complexity: 1 Low PT Treatments $Therapeutic Activity: 8-22 mins       Leitha Bleak, PT 04/26/21, 1:54 PM

## 2021-04-26 NOTE — TOC Progression Note (Addendum)
Transition of Care (TOC) - Progression Note    Patient Details  Name: ANDREN BETHEA Sr. MRN: [id number] Date of Birth: 06-04-1940  [facility] CM/SW [place], LCSW Phone Number: 04/26/2021, 8:36 AM  Clinical Narrative:   Clabe Seal with [facility] that patient is back at the hospital.   2:30- Spoke to spouse via phone regarding PT recs. She stated she wants to defer to their granddaughter Jerene Pitch and what they think, but that she does feel she would have difficulty providing physical assistance to patient at this time.  Called [facility] who is at bedside. Jerene Pitch stated they agree patient needs some short term rehab prior to returning home. She stated they will work on getting a hospital bed and whatever else is needed for the patient to return home after his rehab. She stated patient is agreeable to SNF as well.  They would prefer [facility] but understand it is based on availability. Would also be ok with [facility] or [facility]. CSW is starting SNF work up.       Expected Discharge Plan and Services                                                 Social Determinants of Health (SDOH) Interventions    Readmission Risk Interventions No flowsheet data found.

## 2021-04-26 NOTE — NC FL2 (Signed)
[place] LEVEL OF CARE SCREENING TOOL     IDENTIFICATION  Patient Name: Gilbert [name] Sr. Birthdate: 11-20-1940 Sex: male Admission Date (Current Location): 04/25/2021  [facility] and [place] Number:  Engineering geologist and Address:  [facility], [address], [place], [place] [address]      Provider Number: [id number]  Attending Physician Name and Address:  Wyvonnia Dusky, MD  Relative Name and Phone Number:  Rodena Piety     [phone]    Current Level of Care: Hospital Recommended Level of Care: [facility] Prior Approval Number:    Date Approved/Denied:   PASRR Number: [id number]  Discharge Plan:      Current Diagnoses: Patient Active Problem List   Diagnosis Date Noted   AKI (acute kidney injury) ([place]) 04/25/2021   Fall 04/20/2021   Right rib fracture 04/20/2021   Acute respiratory failure with hypoxia ([place]) 04/20/2021   Elevated troponin 04/20/2021   HTN (hypertension) 04/20/2021   HLD (hyperlipidemia) 04/20/2021   Hypothyroidism 04/20/2021   Chronic diastolic CHF (congestive heart failure) ([place]) 04/20/2021   Parkinson disease ([place])    CKD (chronic kidney disease), stage IIIa    Type II diabetes mellitus with renal manifestations ([place]) 06/10/2019   Abnormal findings on diagnostic imaging of gall bladder 06/10/2019   Obstructive jaundice 06/10/2019   Abdominal pain 06/10/2019   History of CVA (cerebrovascular accident) without residual deficits 06/10/2019   Hypertensive urgency 06/10/2019   Hypoglycemia associated with type 2 diabetes mellitus ([place]) 06/10/2019   Dehydration 12/10/2018   Seizure disorder ([place])    Expressive aphasia    CVA (cerebral vascular accident) ([place]) 05/05/2016    Orientation RESPIRATION BLADDER Height & Weight     Self, Time, Situation, Place  O2 Continent Weight: 194 lb 3.6 oz (88.1 kg) Height:  5' 10.5" (179.1 cm)  BEHAVIORAL SYMPTOMS/MOOD  NEUROLOGICAL BOWEL NUTRITION STATUS        Diet (heart healthy/carb modified, thin liquids)  AMBULATORY STATUS COMMUNICATION OF NEEDS Skin   Limited Assist Verbally Bruising                       Personal Care Assistance Level of Assistance  Bathing, Feeding, Dressing Bathing Assistance: Limited assistance Feeding assistance: Independent Dressing Assistance: Limited assistance     Functional Limitations Info             SPECIAL CARE FACTORS FREQUENCY  PT (By licensed PT), OT (By licensed OT)     PT Frequency: 5 times per week OT Frequency: 5 times per week            Contractures      Additional Factors Info  Code Status, Allergies Code Status Info: full Allergies Info: nka           Current Medications (04/26/2021):  This is the current hospital active medication list Current Facility-Administered Medications  Medication Dose Route Frequency Provider Last Rate Last Admin   acetaminophen (TYLENOL) tablet 1,000 mg  1,000 mg Oral Q6H PRN Cox, Amy N, DO       Or   acetaminophen (TYLENOL) suppository 650 mg  650 mg Rectal Q6H PRN Cox, Amy N, DO       acetaminophen (TYLENOL) tablet 650 mg  650 mg Oral Once Cox, Amy N, DO       ascorbic acid (VITAMIN C) tablet 1,000 mg  1,000 mg Oral Daily Cox, Amy N, DO   1,000 mg at 04/26/21  1004   aspirin EC tablet 81 mg  81 mg Oral Daily Cox, Amy N, DO   81 mg at 04/26/21 1004   atenolol (TENORMIN) tablet 50 mg  50 mg Oral Daily Cox, Amy N, DO   50 mg at 04/26/21 1004   atorvastatin (LIPITOR) tablet 40 mg  40 mg Oral QHS Cox, Amy N, DO   40 mg at 04/25/21 2219   carbidopa-levodopa (SINEMET IR) 25-100 MG per tablet immediate release 1 tablet  1 tablet Oral TID Cox, Amy N, DO   1 tablet at 04/26/21 1004   divalproex (DEPAKOTE ER) 24 hr tablet 500 mg  500 mg Oral BID Cox, Amy N, DO   500 mg at 04/26/21 1004   donepezil (ARICEPT) tablet 10 mg  10 mg Oral Daily Cox, Amy N, DO   10 mg at 04/26/21 1004   heparin injection 5,000 Units   5,000 Units Subcutaneous Q8H Cox, Amy N, DO   5,000 Units at 04/26/21 1237   hydrALAZINE (APRESOLINE) tablet 100 mg  100 mg Oral TID Cox, Amy N, DO   100 mg at 04/26/21 1004   HYDROcodone-acetaminophen (NORCO/VICODIN) 5-325 MG per tablet 1 tablet  1 tablet Oral Q6H PRN Cox, Amy N, DO       insulin aspart (novoLOG) injection 0-5 Units  0-5 Units Subcutaneous QHS Cox, Amy N, DO   2 Units at 04/25/21 2220   insulin aspart (novoLOG) injection 0-9 Units  0-9 Units Subcutaneous TID WC Cox, Amy N, DO   3 Units at 04/26/21 1237   lactated ringers infusion   Intravenous Continuous Wyvonnia Dusky, MD 75 mL/hr at 04/26/21 0808 Rate Change at 04/26/21 0808   levothyroxine (SYNTHROID) tablet 75 mcg  75 mcg Oral Q0600 Cox, Amy N, DO   75 mcg at 04/26/21 0544   lidocaine (LIDODERM) 5 % 1 patch  1 patch Transdermal Q24H Cox, Amy N, DO   1 patch at 04/25/21 2037   LORazepam (ATIVAN) injection 2 mg  2 mg Intravenous PRN Cox, Amy N, DO       morphine 2 MG/ML injection 0.5 mg  0.5 mg Intravenous Q3H PRN Cox, Amy N, DO       ondansetron (ZOFRAN) tablet 4 mg  4 mg Oral Q6H PRN Cox, Amy N, DO       Or   ondansetron (ZOFRAN) injection 4 mg  4 mg Intravenous Q6H PRN Cox, Amy N, DO       rivastigmine (EXELON) capsule 1.5 mg  1.5 mg Oral BID Cox, Amy N, DO   1.5 mg at 04/26/21 1004     Discharge Medications: Please see discharge summary for a list of discharge medications.  Relevant Imaging Results:  Relevant Lab Results:   Additional Information SS #: [place], LCSW

## 2021-04-26 NOTE — Progress Notes (Signed)
PROGRESS NOTE    Gilbert MURRILLO Sr.  CHY:[id number] DOB: 07/17/1940 DOA: 04/25/2021 PCP: [name], MD   Assessment & Plan:   Principal Problem:   AKI (acute kidney injury) [facility]) Active Problems:   Expressive aphasia   Seizure disorder ([place])   History of CVA (cerebrovascular accident) without residual deficits   Fall   Right rib fracture   Elevated troponin   HTN (hypertension)   HLD (hyperlipidemia)   Hypothyroidism   CKD (chronic kidney disease), stage IIIa   AKI on CKDIIIa: likely prerenal. Continue on IVFs. Cr is trending down from day prior    Second right rib fracture: secondary to fall on 04/20/2021. Norco, morphine prn. Encourage incentive spirometry    DM2: likely poorly controlled. Continue on SSI w/ accuchecks   Hx of CVA: w/ residual expressive aphasia. Continue on aspirin, statin    Hx of seizures: continue on home dose of depakote    Parkinsons: w/ ? Parkinson's dementia. Continue on home dose of sinemet, rivastigmine, donepezil    HTN: continue on home dose of atenolol, diltiazem, hydralazine   Sinus bradycardia: chronic   Hypothyroidism: continue on home dose of levothyroxine   Thrombocytopenia: etiology unclear. Will continue to monitor   DVT prophylaxis: heparin  Code Status: full  Family Communication:  Disposition Plan: possibly d/c to SNF   Level of care: Med-Surg  Status is: Observation  The patient remains OBS appropriate and will d/c before 2 midnights.     Consultants:    Procedures:   Antimicrobials:    Subjective: Pt c/o fatigue   Objective: Vitals:   04/25/21 2326 04/26/21 0504 04/26/21 0819 04/26/21 1242  BP: (!) 187/64 (!) 155/58 (!) 187/66 (!) 168/68  Pulse: (!) 58 (!) 52 (!) 57 (!) 51  Resp:  17 18 18   Temp:  97.9 F (36.6 C) 97.9 F (36.6 C) 97.9 F (36.6 C)  TempSrc:  Oral Oral Oral  SpO2: 97% 93% 97% 96%  Weight:      Height:        Intake/Output Summary (Last 24 hours) at 04/26/2021  1308 Last data filed at 04/26/2021 8786 Gross per 24 hour  Intake 1485.62 ml  Output 200 ml  Net 1285.62 ml   Filed Weights   04/25/21 1653 04/25/21 2324  Weight: 88 kg 88.1 kg    Examination:  General exam: Appears calm and comfortable  Respiratory system: diminished breath sounds b/l  Cardiovascular system: S1 & S2 +. No  rubs, gallops or clicks. Gastrointestinal system: Abdomen is nondistended, soft and nontender. Normal bowel sounds heard. Central nervous system: Alert and oriented. Moves all extremities  Psychiatry: Judgement and insight appear poor. Flat mood and affect    Data Reviewed: I have personally reviewed following labs and imaging studies  CBC: Recent Labs  Lab 04/20/21 1019 04/25/21 1701 04/26/21 0712  WBC 8.8 8.3 6.0  NEUTROABS 6.7  --   --   HGB 13.0 13.5 11.4*  HCT 39.9 42.1 35.2*  MCV 95.5 97.5 96.4  PLT 137* 195 767*   Basic Metabolic Panel: Recent Labs  Lab 04/20/21 1019 04/25/21 1701 04/26/21 0712  NA 136 137 138  K 4.5 3.6 3.5  CL 101 100 104  CO2 29 27 26   GLUCOSE 257* 250* 195*  BUN 21 55* 40*  CREATININE 1.36* 2.45* 1.49*  CALCIUM 8.2* 8.6* 8.2*   GFR: Estimated Creatinine Clearance: 40.1 mL/min (A) (by C-G formula based on SCr of 1.49 mg/dL (H)). Liver Function Tests:  Recent Labs  Lab 04/20/21 1019  AST 23  ALT 7  ALKPHOS 83  BILITOT 0.9  PROT 7.3  ALBUMIN 3.4*   No results for input(s): LIPASE, AMYLASE in the last 168 hours. No results for input(s): AMMONIA in the last 168 hours. Coagulation Profile: No results for input(s): INR, PROTIME in the last 168 hours. Cardiac Enzymes: Recent Labs  Lab 04/25/21 1701  CKTOTAL 67   BNP (last 3 results) No results for input(s): PROBNP in the last 8760 hours. HbA1C: No results for input(s): HGBA1C in the last 72 hours. CBG: Recent Labs  Lab 04/21/21 1534 04/21/21 1952 04/22/21 0751 04/25/21 2130 04/26/21 1229  GLUCAP 224* 122* 110* 216* 227*   Lipid Profile: No  results for input(s): CHOL, HDL, LDLCALC, TRIG, CHOLHDL, LDLDIRECT in the last 72 hours. Thyroid Function Tests: No results for input(s): TSH, T4TOTAL, FREET4, T3FREE, THYROIDAB in the last 72 hours. Anemia Panel: No results for input(s): VITAMINB12, FOLATE, FERRITIN, TIBC, IRON, RETICCTPCT in the last 72 hours. Sepsis Labs: No results for input(s): PROCALCITON, LATICACIDVEN in the last 168 hours.  Recent Results (from the past 240 hour(s))  Resp Panel by RT-PCR (Flu A&B, Covid) Nasopharyngeal Swab     Status: None   Collection Time: 04/20/21 10:31 AM   Specimen: Nasopharyngeal Swab; Nasopharyngeal(NP) swabs in vial transport medium  Result Value Ref Range Status   SARS Coronavirus 2 by RT PCR NEGATIVE NEGATIVE Final    Comment: (NOTE) SARS-CoV-2 target nucleic acids are NOT DETECTED.  The SARS-CoV-2 RNA is generally detectable in upper respiratory specimens during the acute phase of infection. The lowest concentration of SARS-CoV-2 viral copies this assay can detect is 138 copies/mL. A negative result does not preclude SARS-Cov-2 infection and should not be used as the sole basis for treatment or other patient management decisions. A negative result may occur with  improper specimen collection/handling, submission of specimen other than nasopharyngeal swab, presence of viral mutation(s) within the areas targeted by this assay, and inadequate number of viral copies(<138 copies/mL). A negative result must be combined with clinical observations, patient history, and epidemiological information. The expected result is Negative.  Fact Sheet for Patients:  EntrepreneurPulse.com.au  Fact Sheet for Healthcare Providers:  IncredibleEmployment.be  This test is no t yet approved or cleared by the [place] FDA and  has been authorized for detection and/or diagnosis of SARS-CoV-2 by FDA under an Emergency Use Authorization (EUA). This EUA will remain   in effect (meaning this test can be used) for the duration of the COVID-19 declaration under Section 564(b)(1) of the Act, 21 U.S.C.section 360bbb-3(b)(1), unless the authorization is terminated  or revoked sooner.       Influenza A by PCR NEGATIVE NEGATIVE Final   Influenza B by PCR NEGATIVE NEGATIVE Final    Comment: (NOTE) The Xpert Xpress SARS-CoV-2/FLU/RSV plus assay is intended as an aid in the diagnosis of influenza from Nasopharyngeal swab specimens and should not be used as a sole basis for treatment. Nasal washings and aspirates are unacceptable for Xpert Xpress SARS-CoV-2/FLU/RSV testing.  Fact Sheet for Patients: EntrepreneurPulse.com.au  Fact Sheet for Healthcare Providers: IncredibleEmployment.be  This test is not yet approved or cleared by the [place] FDA and has been authorized for detection and/or diagnosis of SARS-CoV-2 by FDA under an Emergency Use Authorization (EUA). This EUA will remain in effect (meaning this test can be used) for the duration of the COVID-19 declaration under Section 564(b)(1) of the Act, 21 U.S.C. section 360bbb-3(b)(1), unless the  authorization is terminated or revoked.  Performed at [facility], [address] [place], [place] [address]   Resp Panel by RT-PCR (Flu A&B, Covid) Nasopharyngeal Swab     Status: None   Collection Time: 04/25/21  7:02 PM   Specimen: Nasopharyngeal Swab; Nasopharyngeal(NP) swabs in vial transport medium  Result Value Ref Range Status   SARS Coronavirus 2 by RT PCR NEGATIVE NEGATIVE Final    Comment: (NOTE) SARS-CoV-2 target nucleic acids are NOT DETECTED.  The SARS-CoV-2 RNA is generally detectable in upper respiratory specimens during the acute phase of infection. The lowest concentration of SARS-CoV-2 viral copies this assay can detect is 138 copies/mL. A negative result does not preclude SARS-Cov-2 infection and should not be used as the sole  basis for treatment or other patient management decisions. A negative result may occur with  improper specimen collection/handling, submission of specimen other than nasopharyngeal swab, presence of viral mutation(s) within the areas targeted by this assay, and inadequate number of viral copies(<138 copies/mL). A negative result must be combined with clinical observations, patient history, and epidemiological information. The expected result is Negative.  Fact Sheet for Patients:  EntrepreneurPulse.com.au  Fact Sheet for Healthcare Providers:  IncredibleEmployment.be  This test is no t yet approved or cleared by the [place] FDA and  has been authorized for detection and/or diagnosis of SARS-CoV-2 by FDA under an Emergency Use Authorization (EUA). This EUA will remain  in effect (meaning this test can be used) for the duration of the COVID-19 declaration under Section 564(b)(1) of the Act, 21 U.S.C.section 360bbb-3(b)(1), unless the authorization is terminated  or revoked sooner.       Influenza A by PCR NEGATIVE NEGATIVE Final   Influenza B by PCR NEGATIVE NEGATIVE Final    Comment: (NOTE) The Xpert Xpress SARS-CoV-2/FLU/RSV plus assay is intended as an aid in the diagnosis of influenza from Nasopharyngeal swab specimens and should not be used as a sole basis for treatment. Nasal washings and aspirates are unacceptable for Xpert Xpress SARS-CoV-2/FLU/RSV testing.  Fact Sheet for Patients: EntrepreneurPulse.com.au  Fact Sheet for Healthcare Providers: IncredibleEmployment.be  This test is not yet approved or cleared by the [place] FDA and has been authorized for detection and/or diagnosis of SARS-CoV-2 by FDA under an Emergency Use Authorization (EUA). This EUA will remain in effect (meaning this test can be used) for the duration of the COVID-19 declaration under Section 564(b)(1) of the Act,  21 U.S.C. section 360bbb-3(b)(1), unless the authorization is terminated or revoked.  Performed at [facility], [address], [place],  [address]          Radiology Studies: DG Chest 2 View  Result Date: 04/25/2021 CLINICAL DATA:  Pt complains of weakness increased pain in the right side. Low O2 sats in triage. Recent Right 2nd rib fx. Pt reports focal pain at inferior angle of Right scapula. Pt sts no known heart or lung conditions. EXAM: CHEST - 2 VIEW COMPARISON:  04/20/2021 FINDINGS: Cardiac silhouette is normal in size. No mediastinal or hilar masses or evidence adenopathy. Mild linear opacities the lung bases consistent with atelectasis or scarring. Lungs otherwise clear. No pleural effusion or pneumothorax. Skeletal structures are demineralized, grossly intact. IMPRESSION: No active cardiopulmonary disease. Electronically Signed   By: Lajean Manes M.D.   On: 04/25/2021 17:54        Scheduled Meds:  acetaminophen  650 mg Oral Once   vitamin C  1,000 mg Oral Daily   aspirin EC  81 mg Oral Daily   atenolol  50 mg Oral Daily   atorvastatin  40 mg Oral QHS   carbidopa-levodopa  1 tablet Oral TID   divalproex  500 mg Oral BID   donepezil  10 mg Oral Daily   heparin  5,000 Units Subcutaneous Q8H   hydrALAZINE  100 mg Oral TID   insulin aspart  0-5 Units Subcutaneous QHS   insulin aspart  0-9 Units Subcutaneous TID WC   levothyroxine  75 mcg Oral Q0600   lidocaine  1 patch Transdermal Q24H   rivastigmine  1.5 mg Oral BID   Continuous Infusions:  lactated ringers 75 mL/hr at 04/26/21 0808     LOS: 0 days    Time spent: 30 mins     Wyvonnia Dusky, MD [facility] Pager [phone]  If 7PM-7AM, please contact night-coverage 04/26/2021, 1:08 PM

## 2021-04-27 DIAGNOSIS — S2231XA Fracture of one rib, right side, initial encounter for closed fracture: Secondary | ICD-10-CM

## 2021-04-27 DIAGNOSIS — D696 Thrombocytopenia, unspecified: Secondary | ICD-10-CM

## 2021-04-27 LAB — CBC
HCT: 34.9 % — ABNORMAL LOW (ref 39.0–52.0)
Hemoglobin: 11.3 g/dL — ABNORMAL LOW (ref 13.0–17.0)
MCH: 31.6 pg (ref 26.0–34.0)
MCHC: 32.4 g/dL (ref 30.0–36.0)
MCV: 97.5 fL (ref 80.0–100.0)
Platelets: 134 10*3/uL — ABNORMAL LOW (ref 150–400)
RBC: 3.58 MIL/uL — ABNORMAL LOW (ref 4.22–5.81)
RDW: 13.3 % (ref 11.5–15.5)
WBC: 4.6 10*3/uL (ref 4.0–10.5)
nRBC: 0 % (ref 0.0–0.2)

## 2021-04-27 LAB — BASIC METABOLIC PANEL
Anion gap: 8 (ref 5–15)
BUN: 35 mg/dL — ABNORMAL HIGH (ref 8–23)
CO2: 25 mmol/L (ref 22–32)
Calcium: 8.3 mg/dL — ABNORMAL LOW (ref 8.9–10.3)
Chloride: 107 mmol/L (ref 98–111)
Creatinine, Ser: 1.24 mg/dL (ref 0.61–1.24)
GFR, Estimated: 58 mL/min — ABNORMAL LOW (ref >60)
Glucose, Bld: 171 mg/dL — ABNORMAL HIGH (ref 70–99)
Potassium: 3.6 mmol/L (ref 3.5–5.1)
Sodium: 140 mmol/L (ref 135–145)

## 2021-04-27 LAB — RESP PANEL BY RT-PCR (FLU A&B, COVID) ARPGX2
Influenza A by PCR: NEGATIVE
Influenza B by PCR: NEGATIVE
SARS Coronavirus 2 by RT PCR: NEGATIVE

## 2021-04-27 LAB — GLUCOSE, CAPILLARY
Glucose-Capillary: 174 mg/dL — ABNORMAL HIGH (ref 70–99)
Glucose-Capillary: 225 mg/dL — ABNORMAL HIGH (ref 70–99)
Glucose-Capillary: 197 mg/dL — ABNORMAL HIGH (ref 70–99)
Glucose-Capillary: 209 mg/dL — ABNORMAL HIGH (ref 70–99)

## 2021-04-27 NOTE — Progress Notes (Signed)
PROGRESS NOTE    Gilbert [name] Sr.  BWG:[id number] DOB: May 24, 1940 DOA: 04/25/2021 PCP: [name], MD   Assessment & Plan:   Principal Problem:   AKI (acute kidney injury) [facility]) Active Problems:   Expressive aphasia   Seizure disorder ([place])   History of CVA (cerebrovascular accident) without residual deficits   Fall   Right rib fracture   Elevated troponin   HTN (hypertension)   HLD (hyperlipidemia)   Hypothyroidism   CKD (chronic kidney disease), stage IIIa   AKI on CKDIIIa: likely prerenal. Cr continues to trend down    Second right rib fracture: secondary to fall on 04/20/2021. Norco, morphine prn. Encourage incentive spirometry    DM2: poorly controlled, HbA1c 8.3. Continue on SSI w/ accuchecks    Hx of CVA: w/ residual expressive aphasia. Continue on stain, aspirin    Hx of seizures: continue on home dose of depakote    Parkinsons: w/ ? Parkinson's dementia. Continue on home dose of rivastigmine, sinemet, donepezil    HTN: continue on home dose of hydralazine, atenolol, diltiazem   Sinus bradycardia: chronic   Hypothyroidism: continue on home dose os synthroid    Thrombocytopenia: etiology unclear. Will continue to monitor   DVT prophylaxis: heparin  Code Status: full  Family Communication:  Disposition Plan: possibly d/c to SNF   Level of care: Med-Surg  Status is: Observation  The patient remains OBS appropriate and will d/c before 2 midnights.     Consultants:    Procedures:   Antimicrobials:    Subjective: Pt c/o malaise   Objective: Vitals:   04/26/21 2034 04/27/21 0029 04/27/21 0511 04/27/21 0517  BP: (!) 163/59 (!) 165/59 (!) 186/68 (!) 167/76  Pulse: (!) 56 (!) 55 (!) 56   Resp: 16 16 18    Temp: 98 F (36.7 C) 98.5 F (36.9 C) 98.3 F (36.8 C)   TempSrc: Oral Oral Oral   SpO2: 94% 93% 95%   Weight:      Height:        Intake/Output Summary (Last 24 hours) at 04/27/2021 0729 Last data filed at 04/27/2021  1779 Gross per 24 hour  Intake 1061.03 ml  Output 1375 ml  Net -313.97 ml   Filed Weights   04/25/21 1653 04/25/21 2324  Weight: 88 kg 88.1 kg    Examination:  General exam: Appears comfortable  Respiratory system: decreased breath sounds b/l  Cardiovascular system: S1 & S2+. No rubs or gallops. Gastrointestinal system: Abd is soft, NT, ND & normal bowel sounds  Central nervous system:alert and oriented. Moves all extremities   Psychiatry: judgement and insight appears normal. Flat mood and affect     Data Reviewed: I have personally reviewed following labs and imaging studies  CBC: Recent Labs  Lab 04/20/21 1019 04/25/21 1701 04/26/21 0712 04/27/21 0408  WBC 8.8 8.3 6.0 4.6  NEUTROABS 6.7  --   --   --   HGB 13.0 13.5 11.4* 11.3*  HCT 39.9 42.1 35.2* 34.9*  MCV 95.5 97.5 96.4 97.5  PLT 137* 195 140* 390*   Basic Metabolic Panel: Recent Labs  Lab 04/20/21 1019 04/25/21 1701 04/26/21 0712 04/27/21 0408  NA 136 137 138 140  K 4.5 3.6 3.5 3.6  CL 101 100 104 107  CO2 29 27 26 25   GLUCOSE 257* 250* 195* 171*  BUN 21 55* 40* 35*  CREATININE 1.36* 2.45* 1.49* 1.24  CALCIUM 8.2* 8.6* 8.2* 8.3*   GFR: Estimated Creatinine Clearance: 48.2 mL/min (by  C-G formula based on SCr of 1.24 mg/dL). Liver Function Tests: Recent Labs  Lab 04/20/21 1019  AST 23  ALT 7  ALKPHOS 83  BILITOT 0.9  PROT 7.3  ALBUMIN 3.4*   No results for input(s): LIPASE, AMYLASE in the last 168 hours. No results for input(s): AMMONIA in the last 168 hours. Coagulation Profile: No results for input(s): INR, PROTIME in the last 168 hours. Cardiac Enzymes: Recent Labs  Lab 04/25/21 1701  CKTOTAL 67   BNP (last 3 results) No results for input(s): PROBNP in the last 8760 hours. HbA1C: No results for input(s): HGBA1C in the last 72 hours. CBG: Recent Labs  Lab 04/22/21 0751 04/25/21 2130 04/26/21 1229 04/26/21 1538 04/26/21 2004  GLUCAP 110* 216* 227* 229* 165*   Lipid  Profile: No results for input(s): CHOL, HDL, LDLCALC, TRIG, CHOLHDL, LDLDIRECT in the last 72 hours. Thyroid Function Tests: No results for input(s): TSH, T4TOTAL, FREET4, T3FREE, THYROIDAB in the last 72 hours. Anemia Panel: Recent Labs    04/26/21 0712  VITAMINB12 278   Sepsis Labs: No results for input(s): PROCALCITON, LATICACIDVEN in the last 168 hours.  Recent Results (from the past 240 hour(s))  Resp Panel by RT-PCR (Flu A&B, Covid) Nasopharyngeal Swab     Status: None   Collection Time: 04/20/21 10:31 AM   Specimen: Nasopharyngeal Swab; Nasopharyngeal(NP) swabs in vial transport medium  Result Value Ref Range Status   SARS Coronavirus 2 by RT PCR NEGATIVE NEGATIVE Final    Comment: (NOTE) SARS-CoV-2 target nucleic acids are NOT DETECTED.  The SARS-CoV-2 RNA is generally detectable in upper respiratory specimens during the acute phase of infection. The lowest concentration of SARS-CoV-2 viral copies this assay can detect is 138 copies/mL. A negative result does not preclude SARS-Cov-2 infection and should not be used as the sole basis for treatment or other patient management decisions. A negative result may occur with  improper specimen collection/handling, submission of specimen other than nasopharyngeal swab, presence of viral mutation(s) within the areas targeted by this assay, and inadequate number of viral copies(<138 copies/mL). A negative result must be combined with clinical observations, patient history, and epidemiological information. The expected result is Negative.  Fact Sheet for Patients:  EntrepreneurPulse.com.au  Fact Sheet for Healthcare Providers:  IncredibleEmployment.be  This test is no t yet approved or cleared by the [place] FDA and  has been authorized for detection and/or diagnosis of SARS-CoV-2 by FDA under an Emergency Use Authorization (EUA). This EUA will remain  in effect (meaning this test can  be used) for the duration of the COVID-19 declaration under Section 564(b)(1) of the Act, 21 U.S.C.section 360bbb-3(b)(1), unless the authorization is terminated  or revoked sooner.       Influenza A by PCR NEGATIVE NEGATIVE Final   Influenza B by PCR NEGATIVE NEGATIVE Final    Comment: (NOTE) The Xpert Xpress SARS-CoV-2/FLU/RSV plus assay is intended as an aid in the diagnosis of influenza from Nasopharyngeal swab specimens and should not be used as a sole basis for treatment. Nasal washings and aspirates are unacceptable for Xpert Xpress SARS-CoV-2/FLU/RSV testing.  Fact Sheet for Patients: EntrepreneurPulse.com.au  Fact Sheet for Healthcare Providers: IncredibleEmployment.be  This test is not yet approved or cleared by the [place] FDA and has been authorized for detection and/or diagnosis of SARS-CoV-2 by FDA under an Emergency Use Authorization (EUA). This EUA will remain in effect (meaning this test can be used) for the duration of the COVID-19 declaration under Section 564(b)(1) of  the Act, 21 U.S.C. section 360bbb-3(b)(1), unless the authorization is terminated or revoked.  Performed at [facility], [address] [place], [place] [address]   Resp Panel by RT-PCR (Flu A&B, Covid) Nasopharyngeal Swab     Status: None   Collection Time: 04/25/21  7:02 PM   Specimen: Nasopharyngeal Swab; Nasopharyngeal(NP) swabs in vial transport medium  Result Value Ref Range Status   SARS Coronavirus 2 by RT PCR NEGATIVE NEGATIVE Final    Comment: (NOTE) SARS-CoV-2 target nucleic acids are NOT DETECTED.  The SARS-CoV-2 RNA is generally detectable in upper respiratory specimens during the acute phase of infection. The lowest concentration of SARS-CoV-2 viral copies this assay can detect is 138 copies/mL. A negative result does not preclude SARS-Cov-2 infection and should not be used as the sole basis for treatment or other patient  management decisions. A negative result may occur with  improper specimen collection/handling, submission of specimen other than nasopharyngeal swab, presence of viral mutation(s) within the areas targeted by this assay, and inadequate number of viral copies(<138 copies/mL). A negative result must be combined with clinical observations, patient history, and epidemiological information. The expected result is Negative.  Fact Sheet for Patients:  EntrepreneurPulse.com.au  Fact Sheet for Healthcare Providers:  IncredibleEmployment.be  This test is no t yet approved or cleared by the [place] FDA and  has been authorized for detection and/or diagnosis of SARS-CoV-2 by FDA under an Emergency Use Authorization (EUA). This EUA will remain  in effect (meaning this test can be used) for the duration of the COVID-19 declaration under Section 564(b)(1) of the Act, 21 U.S.C.section 360bbb-3(b)(1), unless the authorization is terminated  or revoked sooner.       Influenza A by PCR NEGATIVE NEGATIVE Final   Influenza B by PCR NEGATIVE NEGATIVE Final    Comment: (NOTE) The Xpert Xpress SARS-CoV-2/FLU/RSV plus assay is intended as an aid in the diagnosis of influenza from Nasopharyngeal swab specimens and should not be used as a sole basis for treatment. Nasal washings and aspirates are unacceptable for Xpert Xpress SARS-CoV-2/FLU/RSV testing.  Fact Sheet for Patients: EntrepreneurPulse.com.au  Fact Sheet for Healthcare Providers: IncredibleEmployment.be  This test is not yet approved or cleared by the [place] FDA and has been authorized for detection and/or diagnosis of SARS-CoV-2 by FDA under an Emergency Use Authorization (EUA). This EUA will remain in effect (meaning this test can be used) for the duration of the COVID-19 declaration under Section 564(b)(1) of the Act, 21 U.S.C. section 360bbb-3(b)(1),  unless the authorization is terminated or revoked.  Performed at [facility], [address], [place], [place] [address]          Radiology Studies: DG Chest 2 View  Result Date: 04/25/2021 CLINICAL DATA:  Pt complains of weakness increased pain in the right side. Low O2 sats in triage. Recent Right 2nd rib fx. Pt reports focal pain at inferior angle of Right scapula. Pt sts no known heart or lung conditions. EXAM: CHEST - 2 VIEW COMPARISON:  04/20/2021 FINDINGS: Cardiac silhouette is normal in size. No mediastinal or hilar masses or evidence adenopathy. Mild linear opacities the lung bases consistent with atelectasis or scarring. Lungs otherwise clear. No pleural effusion or pneumothorax. Skeletal structures are demineralized, grossly intact. IMPRESSION: No active cardiopulmonary disease. Electronically Signed   By: Lajean Manes M.D.   On: 04/25/2021 17:54        Scheduled Meds:  acetaminophen  650 mg Oral Once   vitamin C  1,000 mg Oral Daily   aspirin EC  81 mg Oral Daily   atenolol  50 mg Oral Daily   atorvastatin  40 mg Oral QHS   carbidopa-levodopa  1 tablet Oral TID   divalproex  500 mg Oral BID   donepezil  10 mg Oral Daily   heparin  5,000 Units Subcutaneous Q8H   hydrALAZINE  100 mg Oral TID   insulin aspart  0-5 Units Subcutaneous QHS   insulin aspart  0-9 Units Subcutaneous TID WC   levothyroxine  75 mcg Oral Q0600   lidocaine  1 patch Transdermal Q24H   rivastigmine  1.5 mg Oral BID   Continuous Infusions:     LOS: 1 day    Time spent: 25 mins     Wyvonnia Dusky, MD [facility] Pager [phone]  If 7PM-7AM, please contact night-coverage 04/27/2021, 7:29 AM

## 2021-04-27 NOTE — TOC Progression Note (Addendum)
Transition of Care (TOC) - Progression Note    Patient Details  Name: Gilbert GRELL Sr. MRN: [id number] Date of Birth: January 31, 1941  Transition of [facility]) CM/SW Contact  Candie Chroman, LCSW Phone Number: 04/27/2021, 9:00 AM  Clinical Narrative:   No bed offers this morning. Sent therapy notes to try and trigger responses.  4:18 pm: Reviewed bed offers with wife and granddaughter. [facility] has declined, [facility] offered a bed, and [facility] hasn't responded. Granddaughter will call patient's son to review and follow up with decision.  5:08 pm: Received call from granddaughter Brooke. She has accepted bed offer from [facility]. Per MD, anticipated discharge date tomorrow. Left message for admissions coordinator to notify. Left voicemail for HTA to notify as well.   Expected Discharge Plan and Services                                                 Social Determinants of Health (SDOH) Interventions    Readmission Risk Interventions No flowsheet data found.

## 2021-04-28 DIAGNOSIS — I10 Essential (primary) hypertension: Secondary | ICD-10-CM

## 2021-04-28 DIAGNOSIS — R531 Weakness: Secondary | ICD-10-CM

## 2021-04-28 LAB — BASIC METABOLIC PANEL
Anion gap: 9 (ref 5–15)
BUN: 32 mg/dL — ABNORMAL HIGH (ref 8–23)
CO2: 24 mmol/L (ref 22–32)
Calcium: 8.2 mg/dL — ABNORMAL LOW (ref 8.9–10.3)
Chloride: 106 mmol/L (ref 98–111)
Creatinine, Ser: 1.26 mg/dL — ABNORMAL HIGH (ref 0.61–1.24)
GFR, Estimated: 57 mL/min — ABNORMAL LOW (ref >60)
Glucose, Bld: 172 mg/dL — ABNORMAL HIGH (ref 70–99)
Potassium: 3.7 mmol/L (ref 3.5–5.1)
Sodium: 139 mmol/L (ref 135–145)

## 2021-04-28 LAB — CBC
HCT: 34.8 % — ABNORMAL LOW (ref 39.0–52.0)
Hemoglobin: 11.4 g/dL — ABNORMAL LOW (ref 13.0–17.0)
MCH: 30.8 pg (ref 26.0–34.0)
MCHC: 32.8 g/dL (ref 30.0–36.0)
MCV: 94.1 fL (ref 80.0–100.0)
Platelets: 144 10*3/uL — ABNORMAL LOW (ref 150–400)
RBC: 3.7 MIL/uL — ABNORMAL LOW (ref 4.22–5.81)
RDW: 13.3 % (ref 11.5–15.5)
WBC: 5 10*3/uL (ref 4.0–10.5)
nRBC: 0 % (ref 0.0–0.2)

## 2021-04-28 LAB — GLUCOSE, CAPILLARY
Glucose-Capillary: 188 mg/dL — ABNORMAL HIGH (ref 70–99)
Glucose-Capillary: 262 mg/dL — ABNORMAL HIGH (ref 70–99)
Glucose-Capillary: 257 mg/dL — ABNORMAL HIGH (ref 70–99)
Glucose-Capillary: 240 mg/dL — ABNORMAL HIGH (ref 70–99)

## 2021-04-28 MED ORDER — LISINOPRIL 20 MG PO TABS
40.0000 mg | ORAL_TABLET | Freq: Every day | ORAL | Status: DC
  Administered 2021-04-28: 40 mg via ORAL
  Filled 2021-04-28: qty 2

## 2021-04-28 NOTE — Progress Notes (Addendum)
PROGRESS NOTE   HPI was taken from Dr. Tobie Poet: Gilbert Finner Sr. is a 81 y.o. male with medical history significant for history of seizures, hypertension, hypothyroid, atypical Parkinson's, cognitive impairment, hyperlipidemia, recent right rib fracture, recent hospitalization for fall, history of CVA with residual deficits of expressive aphasia, CKD 3A, who presents emergency department for chief concerns of family's inability to care for patient.   At bedside patient is able to tell me his full name, his age, and that he is in the hospital.  He is able to identify his granddaughter by name at bedside.   He reports that he is not hurting anymore and that he is not having any shortness of breath.  He states that nothing is bothering him.  He denies any fever, abdominal pain.   Per family, on the day he was discharge, that evening, he was sitting on a chair and when he got up he fell and slept on the floor all night. He has fallen an additional one more time since being discharge.   Patient's granddaughter and son do not feel that patient's spouse can care for him.  Patient spouse is 29 years old and has mild dementia as well.   Social history: Patient lives at home with his spouse. He denies tobacco, etoh, recreational drug use. He is retired and formerly worked in Therapist, occupational.   Vaccination history: He is not vaccinated for covid 19.    As per Dr. Jimmye Norman 04/26/20-04/28/21: Pt presented w/ AKI on CKD and has been treated w/ IVFs. Cr has been labile and currently 1.26. Of note, pt has had multiple falls at home and pt's wife is unable to care for the pt. Pt has hx of Parkinsons. PT/OT evaluated the pt and recommends SNF. CM is still working on SNF placement    Gilbert NAZAR Sr.  OFB:[id number] DOB: 07-Apr-1941 DOA: 04/25/2021 PCP: [name], MD   Assessment & Plan:   Principal Problem:   AKI (acute kidney injury) [facility]) Active Problems:   Expressive aphasia   Seizure disorder  ([place])   History of CVA (cerebrovascular accident) without residual deficits   Fall   Right rib fracture   Elevated troponin   HTN (hypertension)   HLD (hyperlipidemia)   Hypothyroidism   CKD (chronic kidney disease), stage IIIa   AKI on CKDIIIa: Cr is labile. Avoid nephrotoxic meds    Second right rib fracture: secondary to fall on 04/20/2021. Norco, morphine prn. Encourage incentive spirometry   Generalized weakness: PT/OT recs SNF. Waiting on SNF placement    DM2: HbA1c 8.3, poorly controlled. Continue on SSI w/ accuchecks    Hx of CVA: w/ residual expressive aphasia. Continue on stain, aspirin    Hx of seizures: continue on home dose of depakote    Parkinsons: w/ ? Parkinson's dementia. Continue on home dose of donepezil, sinemet and rivastigmine    HTN: poorly controlled. Continue on home dose of hydralazine, atenolol, diltiazem & lisinopril   Sinus bradycardia: chronic   Hypothyroidism: continue on home dose of levothyroxine   Thrombocytopenia: etiology unclear. Will continue to monitor   DVT prophylaxis: heparin  Code Status: full  Family Communication:  Disposition Plan: possibly d/c to SNF   Level of care: Med-Surg  Status is: Inpatient  Remains inpatient appropriate because: waiting on SNF placement    Consultants:    Procedures:   Antimicrobials:    Subjective: Pt c/o fatigue   Objective: Vitals:   04/28/21 0457 04/28/21 0813 04/28/21  1211 04/28/21 1420  BP: (!) 172/70 (!) 193/67 (!) 197/68 (!) 174/63  Pulse: (!) 54 (!) 49 (!) 52   Resp: 20 17 17    Temp: 98 F (36.7 C) 98 F (36.7 C) 97.8 F (36.6 C)   TempSrc:  Oral Oral   SpO2: 93% 93% 96%   Weight:      Height:        Intake/Output Summary (Last 24 hours) at 04/28/2021 1456 Last data filed at 04/28/2021 1300 Gross per 24 hour  Intake 600 ml  Output 300 ml  Net 300 ml   Filed Weights   04/25/21 1653 04/25/21 2324  Weight: 88 kg 88.1 kg    Examination:  General exam: Appears  comfortable  Respiratory system: diminished breath sounds b/l otherwise clear  Cardiovascular system: S1/S2+. No rubs or gallops  Gastrointestinal system: Abd is soft, NT, ND & normal bowel sounds  Central nervous system: alert and oriented. Moves all extremities   Psychiatry: judgement and insight appears normal. Flat mood and affect     Data Reviewed: I have personally reviewed following labs and imaging studies  CBC: Recent Labs  Lab 04/25/21 1701 04/26/21 0712 04/27/21 0408 04/28/21 0452  WBC 8.3 6.0 4.6 5.0  HGB 13.5 11.4* 11.3* 11.4*  HCT 42.1 35.2* 34.9* 34.8*  MCV 97.5 96.4 97.5 94.1  PLT 195 140* 134* 937*   Basic Metabolic Panel: Recent Labs  Lab 04/25/21 1701 04/26/21 0712 04/27/21 0408 04/28/21 0452  NA 137 138 140 139  K 3.6 3.5 3.6 3.7  CL 100 104 107 106  CO2 27 26 25 24   GLUCOSE 250* 195* 171* 172*  BUN 55* 40* 35* 32*  CREATININE 2.45* 1.49* 1.24 1.26*  CALCIUM 8.6* 8.2* 8.3* 8.2*   GFR: Estimated Creatinine Clearance: 47.4 mL/min (A) (by C-G formula based on SCr of 1.26 mg/dL (H)). Liver Function Tests: No results for input(s): AST, ALT, ALKPHOS, BILITOT, PROT, ALBUMIN in the last 168 hours.  No results for input(s): LIPASE, AMYLASE in the last 168 hours. No results for input(s): AMMONIA in the last 168 hours. Coagulation Profile: No results for input(s): INR, PROTIME in the last 168 hours. Cardiac Enzymes: Recent Labs  Lab 04/25/21 1701  CKTOTAL 67   BNP (last 3 results) No results for input(s): PROBNP in the last 8760 hours. HbA1C: No results for input(s): HGBA1C in the last 72 hours. CBG: Recent Labs  Lab 04/27/21 1209 04/27/21 1556 04/27/21 2103 04/28/21 0804 04/28/21 1213  GLUCAP 225* 197* 209* 188* 262*   Lipid Profile: No results for input(s): CHOL, HDL, LDLCALC, TRIG, CHOLHDL, LDLDIRECT in the last 72 hours. Thyroid Function Tests: No results for input(s): TSH, T4TOTAL, FREET4, T3FREE, THYROIDAB in the last 72  hours. Anemia Panel: Recent Labs    04/26/21 0712  VITAMINB12 278   Sepsis Labs: No results for input(s): PROCALCITON, LATICACIDVEN in the last 168 hours.  Recent Results (from the past 240 hour(s))  Resp Panel by RT-PCR (Flu A&B, Covid) Nasopharyngeal Swab     Status: None   Collection Time: 04/20/21 10:31 AM   Specimen: Nasopharyngeal Swab; Nasopharyngeal(NP) swabs in vial transport medium  Result Value Ref Range Status   SARS Coronavirus 2 by RT PCR NEGATIVE NEGATIVE Final    Comment: (NOTE) SARS-CoV-2 target nucleic acids are NOT DETECTED.  The SARS-CoV-2 RNA is generally detectable in upper respiratory specimens during the acute phase of infection. The lowest concentration of SARS-CoV-2 viral copies this assay can detect is 138 copies/mL. A  negative result does not preclude SARS-Cov-2 infection and should not be used as the sole basis for treatment or other patient management decisions. A negative result may occur with  improper specimen collection/handling, submission of specimen other than nasopharyngeal swab, presence of viral mutation(s) within the areas targeted by this assay, and inadequate number of viral copies(<138 copies/mL). A negative result must be combined with clinical observations, patient history, and epidemiological information. The expected result is Negative.  Fact Sheet for Patients:  EntrepreneurPulse.com.au  Fact Sheet for Healthcare Providers:  IncredibleEmployment.be  This test is no t yet approved or cleared by the [place] FDA and  has been authorized for detection and/or diagnosis of SARS-CoV-2 by FDA under an Emergency Use Authorization (EUA). This EUA will remain  in effect (meaning this test can be used) for the duration of the COVID-19 declaration under Section 564(b)(1) of the Act, 21 U.S.C.section 360bbb-3(b)(1), unless the authorization is terminated  or revoked sooner.       Influenza A by  PCR NEGATIVE NEGATIVE Final   Influenza B by PCR NEGATIVE NEGATIVE Final    Comment: (NOTE) The Xpert Xpress SARS-CoV-2/FLU/RSV plus assay is intended as an aid in the diagnosis of influenza from Nasopharyngeal swab specimens and should not be used as a sole basis for treatment. Nasal washings and aspirates are unacceptable for Xpert Xpress SARS-CoV-2/FLU/RSV testing.  Fact Sheet for Patients: EntrepreneurPulse.com.au  Fact Sheet for Healthcare Providers: IncredibleEmployment.be  This test is not yet approved or cleared by the [place] FDA and has been authorized for detection and/or diagnosis of SARS-CoV-2 by FDA under an Emergency Use Authorization (EUA). This EUA will remain in effect (meaning this test can be used) for the duration of the COVID-19 declaration under Section 564(b)(1) of the Act, 21 U.S.C. section 360bbb-3(b)(1), unless the authorization is terminated or revoked.  Performed at [facility], [address] [place], [place] [address]   Resp Panel by RT-PCR (Flu A&B, Covid) Nasopharyngeal Swab     Status: None   Collection Time: 04/25/21  7:02 PM   Specimen: Nasopharyngeal Swab; Nasopharyngeal(NP) swabs in vial transport medium  Result Value Ref Range Status   SARS Coronavirus 2 by RT PCR NEGATIVE NEGATIVE Final    Comment: (NOTE) SARS-CoV-2 target nucleic acids are NOT DETECTED.  The SARS-CoV-2 RNA is generally detectable in upper respiratory specimens during the acute phase of infection. The lowest concentration of SARS-CoV-2 viral copies this assay can detect is 138 copies/mL. A negative result does not preclude SARS-Cov-2 infection and should not be used as the sole basis for treatment or other patient management decisions. A negative result may occur with  improper specimen collection/handling, submission of specimen other than nasopharyngeal swab, presence of viral mutation(s) within the areas targeted  by this assay, and inadequate number of viral copies(<138 copies/mL). A negative result must be combined with clinical observations, patient history, and epidemiological information. The expected result is Negative.  Fact Sheet for Patients:  EntrepreneurPulse.com.au  Fact Sheet for Healthcare Providers:  IncredibleEmployment.be  This test is no t yet approved or cleared by the [place] FDA and  has been authorized for detection and/or diagnosis of SARS-CoV-2 by FDA under an Emergency Use Authorization (EUA). This EUA will remain  in effect (meaning this test can be used) for the duration of the COVID-19 declaration under Section 564(b)(1) of the Act, 21 U.S.C.section 360bbb-3(b)(1), unless the authorization is terminated  or revoked sooner.       Influenza A by PCR  NEGATIVE NEGATIVE Final   Influenza B by PCR NEGATIVE NEGATIVE Final    Comment: (NOTE) The Xpert Xpress SARS-CoV-2/FLU/RSV plus assay is intended as an aid in the diagnosis of influenza from Nasopharyngeal swab specimens and should not be used as a sole basis for treatment. Nasal washings and aspirates are unacceptable for Xpert Xpress SARS-CoV-2/FLU/RSV testing.  Fact Sheet for Patients: EntrepreneurPulse.com.au  Fact Sheet for Healthcare Providers: IncredibleEmployment.be  This test is not yet approved or cleared by the [place] FDA and has been authorized for detection and/or diagnosis of SARS-CoV-2 by FDA under an Emergency Use Authorization (EUA). This EUA will remain in effect (meaning this test can be used) for the duration of the COVID-19 declaration under Section 564(b)(1) of the Act, 21 U.S.C. section 360bbb-3(b)(1), unless the authorization is terminated or revoked.  Performed at [facility], [address] [place], [place] [address]   Resp Panel by RT-PCR (Flu A&B, Covid) Nasopharyngeal Swab     Status:  None   Collection Time: 04/27/21  5:40 PM   Specimen: Nasopharyngeal Swab; Nasopharyngeal(NP) swabs in vial transport medium  Result Value Ref Range Status   SARS Coronavirus 2 by RT PCR NEGATIVE NEGATIVE Final    Comment: (NOTE) SARS-CoV-2 target nucleic acids are NOT DETECTED.  The SARS-CoV-2 RNA is generally detectable in upper respiratory specimens during the acute phase of infection. The lowest concentration of SARS-CoV-2 viral copies this assay can detect is 138 copies/mL. A negative result does not preclude SARS-Cov-2 infection and should not be used as the sole basis for treatment or other patient management decisions. A negative result may occur with  improper specimen collection/handling, submission of specimen other than nasopharyngeal swab, presence of viral mutation(s) within the areas targeted by this assay, and inadequate number of viral copies(<138 copies/mL). A negative result must be combined with clinical observations, patient history, and epidemiological information. The expected result is Negative.  Fact Sheet for Patients:  EntrepreneurPulse.com.au  Fact Sheet for Healthcare Providers:  IncredibleEmployment.be  This test is no t yet approved or cleared by the [place] FDA and  has been authorized for detection and/or diagnosis of SARS-CoV-2 by FDA under an Emergency Use Authorization (EUA). This EUA will remain  in effect (meaning this test can be used) for the duration of the COVID-19 declaration under Section 564(b)(1) of the Act, 21 U.S.C.section 360bbb-3(b)(1), unless the authorization is terminated  or revoked sooner.       Influenza A by PCR NEGATIVE NEGATIVE Final   Influenza B by PCR NEGATIVE NEGATIVE Final    Comment: (NOTE) The Xpert Xpress SARS-CoV-2/FLU/RSV plus assay is intended as an aid in the diagnosis of influenza from Nasopharyngeal swab specimens and should not be used as a sole basis for  treatment. Nasal washings and aspirates are unacceptable for Xpert Xpress SARS-CoV-2/FLU/RSV testing.  Fact Sheet for Patients: EntrepreneurPulse.com.au  Fact Sheet for Healthcare Providers: IncredibleEmployment.be  This test is not yet approved or cleared by the [place] FDA and has been authorized for detection and/or diagnosis of SARS-CoV-2 by FDA under an Emergency Use Authorization (EUA). This EUA will remain in effect (meaning this test can be used) for the duration of the COVID-19 declaration under Section 564(b)(1) of the Act, 21 U.S.C. section 360bbb-3(b)(1), unless the authorization is terminated or revoked.  Performed at [facility], [address] [place], [place] [address]          Radiology Studies: No results found.      Scheduled Meds:  acetaminophen  650 mg Oral Once   vitamin C  1,000 mg Oral Daily   aspirin EC  81 mg Oral Daily   atenolol  50 mg Oral Daily   atorvastatin  40 mg Oral QHS   carbidopa-levodopa  1 tablet Oral TID   divalproex  500 mg Oral BID   donepezil  10 mg Oral Daily   heparin  5,000 Units Subcutaneous Q8H   hydrALAZINE  100 mg Oral TID   insulin aspart  0-5 Units Subcutaneous QHS   insulin aspart  0-9 Units Subcutaneous TID WC   levothyroxine  75 mcg Oral Q0600   lidocaine  1 patch Transdermal Q24H   lisinopril  40 mg Oral QHS   rivastigmine  1.5 mg Oral BID   Continuous Infusions:     LOS: 2 days    Time spent: 15 mins     Wyvonnia Dusky, MD [facility] Pager [phone]  If 7PM-7AM, please contact night-coverage 04/28/2021, 2:56 PM

## 2021-04-28 NOTE — Progress Notes (Signed)
PT Cancellation Note  Patient Details Name: Gilbert [name]. MRN: [id number] DOB: August 06, 1940   Cancelled Treatment:    Reason Eval/Treat Not Completed: Other (comment).  Chart reviewed.  Pt's BP noted to be elevated to 193/67 this morning (0813) and 197/68 at 1211; nurse reports giving pt BP medication to address elevated BP.  Therapist took pt's BP at 1420 and noted to be 174/63 (nurse notified).  Nursing recommending holding OOB mobility (d/t continued elevated BP) but cleared pt to participate in LE ex's in bed.  Pt declined LE ex's in bed d/t pt's concerns of any exertional activity increasing his BP more.  Will re-attempt PT treatment session at a later date/time.  Leitha Bleak, PT 04/28/21, 2:30 PM

## 2021-04-29 DIAGNOSIS — E1122 Type 2 diabetes mellitus with diabetic chronic kidney disease: Secondary | ICD-10-CM | POA: Diagnosis not present

## 2021-04-29 DIAGNOSIS — G40909 Epilepsy, unspecified, not intractable, without status epilepticus: Secondary | ICD-10-CM

## 2021-04-29 DIAGNOSIS — S2231XA Fracture of one rib, right side, initial encounter for closed fracture: Secondary | ICD-10-CM | POA: Diagnosis not present

## 2021-04-29 DIAGNOSIS — R531 Weakness: Secondary | ICD-10-CM

## 2021-04-29 DIAGNOSIS — Z8781 Personal history of (healed) traumatic fracture: Secondary | ICD-10-CM | POA: Diagnosis not present

## 2021-04-29 DIAGNOSIS — E785 Hyperlipidemia, unspecified: Secondary | ICD-10-CM

## 2021-04-29 DIAGNOSIS — N189 Chronic kidney disease, unspecified: Secondary | ICD-10-CM | POA: Diagnosis not present

## 2021-04-29 DIAGNOSIS — I459 Conduction disorder, unspecified: Secondary | ICD-10-CM | POA: Diagnosis not present

## 2021-04-29 DIAGNOSIS — E1151 Type 2 diabetes mellitus with diabetic peripheral angiopathy without gangrene: Secondary | ICD-10-CM | POA: Diagnosis not present

## 2021-04-29 DIAGNOSIS — Z8673 Personal history of transient ischemic attack (TIA), and cerebral infarction without residual deficits: Secondary | ICD-10-CM | POA: Diagnosis not present

## 2021-04-29 DIAGNOSIS — N179 Acute kidney failure, unspecified: Secondary | ICD-10-CM | POA: Diagnosis not present

## 2021-04-29 DIAGNOSIS — Z794 Long term (current) use of insulin: Secondary | ICD-10-CM | POA: Diagnosis not present

## 2021-04-29 DIAGNOSIS — Z743 Need for continuous supervision: Secondary | ICD-10-CM | POA: Diagnosis not present

## 2021-04-29 DIAGNOSIS — R4701 Aphasia: Secondary | ICD-10-CM | POA: Diagnosis not present

## 2021-04-29 DIAGNOSIS — E039 Hypothyroidism, unspecified: Secondary | ICD-10-CM | POA: Diagnosis not present

## 2021-04-29 DIAGNOSIS — E7800 Pure hypercholesterolemia, unspecified: Secondary | ICD-10-CM | POA: Diagnosis not present

## 2021-04-29 DIAGNOSIS — E038 Other specified hypothyroidism: Secondary | ICD-10-CM | POA: Diagnosis not present

## 2021-04-29 DIAGNOSIS — N1831 Chronic kidney disease, stage 3a: Secondary | ICD-10-CM | POA: Diagnosis not present

## 2021-04-29 DIAGNOSIS — Z9981 Dependence on supplemental oxygen: Secondary | ICD-10-CM | POA: Diagnosis not present

## 2021-04-29 DIAGNOSIS — S2231XD Fracture of one rib, right side, subsequent encounter for fracture with routine healing: Secondary | ICD-10-CM | POA: Diagnosis not present

## 2021-04-29 DIAGNOSIS — G20 Parkinson's disease: Secondary | ICD-10-CM | POA: Diagnosis not present

## 2021-04-29 DIAGNOSIS — W1800XA Striking against unspecified object with subsequent fall, initial encounter: Secondary | ICD-10-CM | POA: Diagnosis not present

## 2021-04-29 DIAGNOSIS — N1832 Chronic kidney disease, stage 3b: Secondary | ICD-10-CM | POA: Diagnosis not present

## 2021-04-29 DIAGNOSIS — W19XXXA Unspecified fall, initial encounter: Secondary | ICD-10-CM | POA: Diagnosis not present

## 2021-04-29 DIAGNOSIS — W19XXXD Unspecified fall, subsequent encounter: Secondary | ICD-10-CM | POA: Diagnosis not present

## 2021-04-29 DIAGNOSIS — R5381 Other malaise: Secondary | ICD-10-CM | POA: Diagnosis not present

## 2021-04-29 DIAGNOSIS — Z7982 Long term (current) use of aspirin: Secondary | ICD-10-CM | POA: Diagnosis not present

## 2021-04-29 DIAGNOSIS — I129 Hypertensive chronic kidney disease with stage 1 through stage 4 chronic kidney disease, or unspecified chronic kidney disease: Secondary | ICD-10-CM | POA: Diagnosis not present

## 2021-04-29 LAB — CBC
HCT: 34.8 % — ABNORMAL LOW (ref 39.0–52.0)
Hemoglobin: 11.4 g/dL — ABNORMAL LOW (ref 13.0–17.0)
MCH: 31.1 pg (ref 26.0–34.0)
MCHC: 32.8 g/dL (ref 30.0–36.0)
MCV: 94.8 fL (ref 80.0–100.0)
Platelets: 147 10*3/uL — ABNORMAL LOW (ref 150–400)
RBC: 3.67 MIL/uL — ABNORMAL LOW (ref 4.22–5.81)
RDW: 13.2 % (ref 11.5–15.5)
WBC: 5.6 10*3/uL (ref 4.0–10.5)
nRBC: 0 % (ref 0.0–0.2)

## 2021-04-29 LAB — BASIC METABOLIC PANEL
Anion gap: 9 (ref 5–15)
BUN: 28 mg/dL — ABNORMAL HIGH (ref 8–23)
CO2: 26 mmol/L (ref 22–32)
Calcium: 8.2 mg/dL — ABNORMAL LOW (ref 8.9–10.3)
Chloride: 104 mmol/L (ref 98–111)
Creatinine, Ser: 1.2 mg/dL (ref 0.61–1.24)
GFR, Estimated: >60 mL/min (ref >60)
Glucose, Bld: 171 mg/dL — ABNORMAL HIGH (ref 70–99)
Potassium: 3.5 mmol/L (ref 3.5–5.1)
Sodium: 139 mmol/L (ref 135–145)

## 2021-04-29 LAB — GLUCOSE, CAPILLARY
Glucose-Capillary: 176 mg/dL — ABNORMAL HIGH (ref 70–99)
Glucose-Capillary: 220 mg/dL — ABNORMAL HIGH (ref 70–99)

## 2021-04-29 MED ORDER — AMLODIPINE BESYLATE 2.5 MG PO TABS: 2.5000 mg | ORAL_TABLET | Freq: Every day | ORAL | 0 refills | Status: DC

## 2021-04-29 MED ORDER — HYDROCODONE-ACETAMINOPHEN 5-325 MG PO TABS: 1.0000 | ORAL_TABLET | Freq: Three times a day (TID) | ORAL | 0 refills | Status: DC | PRN

## 2021-04-29 MED ORDER — INSULIN ASPART PROT & ASPART (70-30 MIX) 100 UNIT/ML ~~LOC~~ SUSP: SUBCUTANEOUS | 0 refills | Status: DC

## 2021-04-29 MED ORDER — AMLODIPINE BESYLATE 5 MG PO TABS
2.5000 mg | ORAL_TABLET | Freq: Every day | ORAL | Status: DC
  Administered 2021-04-29: 13:00:00 2.5 mg via ORAL
  Filled 2021-04-29: qty 1

## 2021-04-29 NOTE — Discharge Summary (Signed)
[facility] NAME: Gilbert [name]    MR#:  [id number]  DATE OF BIRTH:  11/04/1940  DATE OF ADMISSION:  04/25/2021 ADMITTING PHYSICIAN: Wyvonnia Dusky, MD  DATE OF DISCHARGE: 04/29/2020  PRIMARY CARE PHYSICIAN: [name], MD    ADMISSION DIAGNOSIS:  Dehydration, moderate [E86.0] AKI (acute kidney injury) ([place]) [N17.9] Fall, initial encounter [W19.XXXA]  DISCHARGE DIAGNOSIS:  Principal Problem:   AKI (acute kidney injury) ([place]) Active Problems:   Expressive aphasia   Seizure disorder ([place])   History of CVA (cerebrovascular accident) without residual deficits   Fall   Right rib fracture   Elevated troponin   HTN (hypertension)   HLD (hyperlipidemia)   Hypothyroidism   CKD (chronic kidney disease), stage IIIa   SECONDARY DIAGNOSIS:   Past Medical History:  Diagnosis Date   Choledocholithiasis    Chronic cholecystitis    Diabetes mellitus without complication ([place])    Hypertension    Parkinson disease ([place])    Stroke [facility])     HOSPITAL COURSE:   Acute kidney injury on chronic kidney disease stage IIIa.  Creatinine was 2.45 on 04/25/2021.  Last creatinine 1.20.  Continue to hold Lasix. Second right rib fracture secondary to fall on 04/20/2021.  Try to use Tylenol for pain.  Few doses of Norco prescribed upon discharge out to rehab can use prior to physical therapy. Generalized weakness.  Physical therapy recommending rehab Type 2 diabetes mellitus with hemoglobin A1c 8.3.  We will restart 70/30 insulin 8 units with breakfast and dinner.  Can adjust if he starts eating more.  Check fingersticks before every meal and nightly History of prior CVA completed.  Continue aspirin and statin History of seizure on Depakote Parkinson's dementia on rivastigmine and Sinemet Essential hypertension.  On hydralazine atenolol and lisinopril.  Blood pressure variable with pain.  We will add low-dose Norvasc 2.5 mg  daily. Hypothyroidism unspecified on Synthroid and Sinus bradycardia chronic.  Continue atenolol for now Thrombocytopenia.  Chronic in nature.  Last platelet count 147.  DISCHARGE CONDITIONS:   Satisfactory  CONSULTS OBTAINED:    Physical therapy and Occupational Therapy  DRUG ALLERGIES:  No Known Allergies  DISCHARGE MEDICATIONS:   Allergies as of 04/29/2021   No Known Allergies      Medication List     STOP taking these medications    donepezil 10 MG tablet Commonly known as: ARICEPT   furosemide 20 MG tablet Commonly known as: LASIX   lidocaine 5 % Commonly known as: LIDODERM       TAKE these medications    acetaminophen 325 MG tablet Commonly known as: TYLENOL Take 2 tablets (650 mg total) by mouth every 6 (six) hours as needed for mild pain or fever.   aspirin 81 MG EC tablet Take 1 tablet (81 mg total) by mouth daily.   atenolol 50 MG tablet Commonly known as: TENORMIN Take 50 mg by mouth daily.   atorvastatin 80 MG tablet Commonly known as: LIPITOR Take 40 mg by mouth at bedtime.   carbidopa-levodopa 25-100 MG tablet Commonly known as: SINEMET IR Take 1 tablet by mouth 3 (three) times daily.   colchicine 0.6 MG tablet Take 1 tablet by mouth daily as needed.   divalproex 500 MG 24 hr tablet Commonly known as: DEPAKOTE ER Take 500 mg by mouth 2 (two) times daily.   hydrALAZINE 100 MG tablet Commonly known as: APRESOLINE Take 1 tablet (100 mg total) by mouth 3 (  three) times daily.   HYDROcodone-acetaminophen 5-325 MG tablet Commonly known as: NORCO/VICODIN Take 1 tablet by mouth every 8 (eight) hours as needed for severe pain.   insulin aspart protamine- aspart (70-30) 100 UNIT/ML injection Commonly known as: NOVOLOG MIX 70/30 Inject 8 u under the skin every morning and inject 8u with dinner What changed:  how much to take how to take this when to take this additional instructions   levothyroxine 75 MCG tablet Commonly known as:  SYNTHROID Take 75 mcg by mouth daily before breakfast.   lisinopril 40 MG tablet Commonly known as: ZESTRIL Take 1 tablet (40 mg total) by mouth at bedtime.   rivastigmine 1.5 MG capsule Commonly known as: EXELON Take 1.5 mg by mouth 2 (two) times daily.   vitamin C 500 MG tablet Commonly known as: ASCORBIC ACID Take 1,000 mg by mouth daily.         DISCHARGE INSTRUCTIONS:   Follow-up team at rehab 1 day  If you experience worsening of your admission symptoms, develop shortness of breath, life threatening emergency, suicidal or homicidal thoughts you must seek medical attention immediately by calling 911 or calling your MD immediately  if symptoms less severe.  You Must read complete instructions/literature along with all the possible adverse reactions/side effects for all the Medicines you take and that have been prescribed to you. Take any new Medicines after you have completely understood and accept all the possible adverse reactions/side effects.   Please note  You were cared for by a hospitalist during your hospital stay. If you have any questions about your discharge medications or the care you received while you were in the hospital after you are discharged, you can call the unit and asked to speak with the hospitalist on call if the hospitalist that took care of you is not available. Once you are discharged, your primary care physician will handle any further medical issues. Please note that NO REFILLS for any discharge medications will be authorized once you are discharged, as it is imperative that you return to your primary care physician (or establish a relationship with a primary care physician if you do not have one) for your aftercare needs so that they can reassess your need for medications and monitor your lab values.    Today   CHIEF COMPLAINT:   Chief Complaint  Patient presents with   Fall    HISTORY OF PRESENT ILLNESS:  Gilbert [name]  is a 81 y.o.  male came in after a fall   VITAL SIGNS:  Blood pressure (!) 164/64, pulse (!) 59, temperature 98 F (36.7 C), temperature source Oral, resp. rate 17, height 5' 10.5" (1.791 m), weight 88.1 kg, SpO2 92 %.     PHYSICAL EXAMINATION:  GENERAL:  81 y.o.-year-old patient lying in the bed with no acute distress.  EYES: Pupils equal, round, reactive to light and accommodation. No scleral icterus. HEENT: Head atraumatic, normocephalic. Oropharynx and nasopharynx clear.  LUNGS: Normal breath sounds bilaterally, no wheezing, rales,rhonchi or crepitation. No use of accessory muscles of respiration.  CARDIOVASCULAR: S1, S2 normal. No murmurs, rubs, or gallops.  ABDOMEN: Soft, non-tender, non-distended. Bowel sounds present. No organomegaly or mass.  EXTREMITIES: No pedal edema, cyanosis, or clubbing.  NEUROLOGIC: Cranial nerves II through XII are intact. Muscle strength 5/5 in all extremities.  Patient able to straight leg raise bilaterally PSYCHIATRIC: The patient is alert and answers simple questions appropriately.  SKIN: No obvious rash, lesion, or ulcer.   DATA REVIEW:  CBC Recent Labs  Lab 04/29/21 0552  WBC 5.6  HGB 11.4*  HCT 34.8*  PLT 147*    Chemistries  Recent Labs  Lab 04/29/21 0552  NA 139  K 3.5  CL 104  CO2 26  GLUCOSE 171*  BUN 28*  CREATININE 1.20  CALCIUM 8.2*     Microbiology Results  Results for orders placed or performed during the hospital encounter of 04/25/21  Resp Panel by RT-PCR (Flu A&B, Covid) Nasopharyngeal Swab     Status: None   Collection Time: 04/25/21  7:02 PM   Specimen: Nasopharyngeal Swab; Nasopharyngeal(NP) swabs in vial transport medium  Result Value Ref Range Status   SARS Coronavirus 2 by RT PCR NEGATIVE NEGATIVE Final    Comment: (NOTE) SARS-CoV-2 target nucleic acids are NOT DETECTED.  The SARS-CoV-2 RNA is generally detectable in upper respiratory specimens during the acute phase of infection. The lowest concentration of  SARS-CoV-2 viral copies this assay can detect is 138 copies/mL. A negative result does not preclude SARS-Cov-2 infection and should not be used as the sole basis for treatment or other patient management decisions. A negative result may occur with  improper specimen collection/handling, submission of specimen other than nasopharyngeal swab, presence of viral mutation(s) within the areas targeted by this assay, and inadequate number of viral copies(<138 copies/mL). A negative result must be combined with clinical observations, patient history, and epidemiological information. The expected result is Negative.  Fact Sheet for Patients:  EntrepreneurPulse.com.au  Fact Sheet for Healthcare Providers:  IncredibleEmployment.be  This test is no t yet approved or cleared by the [place] FDA and  has been authorized for detection and/or diagnosis of SARS-CoV-2 by FDA under an Emergency Use Authorization (EUA). This EUA will remain  in effect (meaning this test can be used) for the duration of the COVID-19 declaration under Section 564(b)(1) of the Act, 21 U.S.C.section 360bbb-3(b)(1), unless the authorization is terminated  or revoked sooner.       Influenza A by PCR NEGATIVE NEGATIVE Final   Influenza B by PCR NEGATIVE NEGATIVE Final    Comment: (NOTE) The Xpert Xpress SARS-CoV-2/FLU/RSV plus assay is intended as an aid in the diagnosis of influenza from Nasopharyngeal swab specimens and should not be used as a sole basis for treatment. Nasal washings and aspirates are unacceptable for Xpert Xpress SARS-CoV-2/FLU/RSV testing.  Fact Sheet for Patients: EntrepreneurPulse.com.au  Fact Sheet for Healthcare Providers: IncredibleEmployment.be  This test is not yet approved or cleared by the [place] FDA and has been authorized for detection and/or diagnosis of SARS-CoV-2 by FDA under an Emergency Use  Authorization (EUA). This EUA will remain in effect (meaning this test can be used) for the duration of the COVID-19 declaration under Section 564(b)(1) of the Act, 21 U.S.C. section 360bbb-3(b)(1), unless the authorization is terminated or revoked.  Performed at [facility], [address] [place], [place] [address]   Resp Panel by RT-PCR (Flu A&B, Covid) Nasopharyngeal Swab     Status: None   Collection Time: 04/27/21  5:40 PM   Specimen: Nasopharyngeal Swab; Nasopharyngeal(NP) swabs in vial transport medium  Result Value Ref Range Status   SARS Coronavirus 2 by RT PCR NEGATIVE NEGATIVE Final    Comment: (NOTE) SARS-CoV-2 target nucleic acids are NOT DETECTED.  The SARS-CoV-2 RNA is generally detectable in upper respiratory specimens during the acute phase of infection. The lowest concentration of SARS-CoV-2 viral copies this assay can detect is 138 copies/mL. A negative result does not preclude  SARS-Cov-2 infection and should not be used as the sole basis for treatment or other patient management decisions. A negative result may occur with  improper specimen collection/handling, submission of specimen other than nasopharyngeal swab, presence of viral mutation(s) within the areas targeted by this assay, and inadequate number of viral copies(<138 copies/mL). A negative result must be combined with clinical observations, patient history, and epidemiological information. The expected result is Negative.  Fact Sheet for Patients:  EntrepreneurPulse.com.au  Fact Sheet for Healthcare Providers:  IncredibleEmployment.be  This test is no t yet approved or cleared by the [place] FDA and  has been authorized for detection and/or diagnosis of SARS-CoV-2 by FDA under an Emergency Use Authorization (EUA). This EUA will remain  in effect (meaning this test can be used) for the duration of the COVID-19 declaration under Section 564(b)(1)  of the Act, 21 U.S.C.section 360bbb-3(b)(1), unless the authorization is terminated  or revoked sooner.       Influenza A by PCR NEGATIVE NEGATIVE Final   Influenza B by PCR NEGATIVE NEGATIVE Final    Comment: (NOTE) The Xpert Xpress SARS-CoV-2/FLU/RSV plus assay is intended as an aid in the diagnosis of influenza from Nasopharyngeal swab specimens and should not be used as a sole basis for treatment. Nasal washings and aspirates are unacceptable for Xpert Xpress SARS-CoV-2/FLU/RSV testing.  Fact Sheet for Patients: EntrepreneurPulse.com.au  Fact Sheet for Healthcare Providers: IncredibleEmployment.be  This test is not yet approved or cleared by the [place] FDA and has been authorized for detection and/or diagnosis of SARS-CoV-2 by FDA under an Emergency Use Authorization (EUA). This EUA will remain in effect (meaning this test can be used) for the duration of the COVID-19 declaration under Section 564(b)(1) of the Act, 21 U.S.C. section 360bbb-3(b)(1), unless the authorization is terminated or revoked.  Performed at [facility], [address], [place], [place] [address]      Management plans discussed with the patient, family and they are in agreement.  CODE STATUS:     Code Status Orders  (From admission, onward)           Start     Ordered   04/25/21 1900  Full code  Continuous        04/25/21 1903           Code Status History     Date Active Date Inactive Code Status Order ID Comments User Context   04/20/2021 1255 04/22/2021 2307 Full Code [id number]  Ivor Costa, MD ED   06/10/2019 1908 06/15/2019 1804 Full Code [id number]  Athena Masse, MD ED   12/10/2018 2122 12/13/2018 1733 Full Code [id number]  Mayer Camel, NP ED   05/05/2016 1932 05/10/2016 1440 Full Code [id number]  Loletha Grayer, MD ED       TOTAL TIME TAKING CARE OF THIS PATIENT: 32 minutes.  Peer-to-peer with insurance company done.   Spoke with transitional care team   Loletha Grayer M.D on 04/29/2021 at 9:40 AM  [facility]  CC: Primary care physician; [name], MD

## 2021-04-29 NOTE — TOC Progression Note (Addendum)
Transition of Care (TOC) - Progression Note    Patient Details  Name: Gilbert MOSTAFA Sr. MRN: [id number] Date of Birth: [birth year]  Transition of Care [facility]) CM/SW [place], RN Phone Number: 04/29/2021, 9:42 AM  Clinical Narrative:  Patient is discharged today to [facility] was negative on 27 Apr 2021 at 1740 as per Magda Paganini, no need to repeat.  Approval for SNF number [phone], Dr. Leslye Peer performed peer to peer and has verbal confirmation from Dr. Amalia Hailey for ambulance transport, awaiting approval number.             Expected Discharge Plan and Services           Expected Discharge Date: 04/29/21                                     Social Determinants of Health (SDOH) Interventions    Readmission Risk Interventions No flowsheet data found.

## 2021-04-29 NOTE — Care Management Important Message (Signed)
Important Message  Patient Details  Name: Gilbert PINZON Sr. MRN: [id number] Date of Birth: [birth year]   Medicare Important Message Given:  N/A - LOS <3 / Initial given by admissions     Gilbert [name] 04/29/2021, 11:45 AM

## 2021-04-30 DIAGNOSIS — G20 Parkinson's disease: Secondary | ICD-10-CM | POA: Diagnosis not present

## 2021-04-30 DIAGNOSIS — G40909 Epilepsy, unspecified, not intractable, without status epilepticus: Secondary | ICD-10-CM | POA: Diagnosis not present

## 2021-04-30 DIAGNOSIS — E1151 Type 2 diabetes mellitus with diabetic peripheral angiopathy without gangrene: Secondary | ICD-10-CM | POA: Diagnosis not present

## 2021-04-30 DIAGNOSIS — Z8781 Personal history of (healed) traumatic fracture: Secondary | ICD-10-CM | POA: Diagnosis not present

## 2021-04-30 DIAGNOSIS — N1832 Chronic kidney disease, stage 3b: Secondary | ICD-10-CM | POA: Diagnosis not present

## 2021-04-30 DIAGNOSIS — W1800XA Striking against unspecified object with subsequent fall, initial encounter: Secondary | ICD-10-CM | POA: Diagnosis not present

## 2021-05-14 DIAGNOSIS — Z8673 Personal history of transient ischemic attack (TIA), and cerebral infarction without residual deficits: Secondary | ICD-10-CM | POA: Diagnosis not present

## 2021-05-14 DIAGNOSIS — E1122 Type 2 diabetes mellitus with diabetic chronic kidney disease: Secondary | ICD-10-CM | POA: Diagnosis not present

## 2021-05-14 DIAGNOSIS — I5032 Chronic diastolic (congestive) heart failure: Secondary | ICD-10-CM | POA: Diagnosis not present

## 2021-05-14 DIAGNOSIS — E039 Hypothyroidism, unspecified: Secondary | ICD-10-CM | POA: Diagnosis not present

## 2021-05-14 DIAGNOSIS — N1832 Chronic kidney disease, stage 3b: Secondary | ICD-10-CM | POA: Diagnosis not present

## 2021-05-14 DIAGNOSIS — E782 Mixed hyperlipidemia: Secondary | ICD-10-CM | POA: Diagnosis not present

## 2021-05-14 DIAGNOSIS — G20 Parkinson's disease: Secondary | ICD-10-CM | POA: Diagnosis not present

## 2021-05-14 DIAGNOSIS — E1151 Type 2 diabetes mellitus with diabetic peripheral angiopathy without gangrene: Secondary | ICD-10-CM | POA: Diagnosis not present

## 2021-05-14 DIAGNOSIS — K811 Chronic cholecystitis: Secondary | ICD-10-CM | POA: Diagnosis not present

## 2021-05-14 DIAGNOSIS — S2231XD Fracture of one rib, right side, subsequent encounter for fracture with routine healing: Secondary | ICD-10-CM | POA: Diagnosis not present

## 2021-05-14 DIAGNOSIS — I130 Hypertensive heart and chronic kidney disease with heart failure and stage 1 through stage 4 chronic kidney disease, or unspecified chronic kidney disease: Secondary | ICD-10-CM | POA: Diagnosis not present

## 2021-05-14 DIAGNOSIS — I700 Atherosclerosis of aorta: Secondary | ICD-10-CM | POA: Diagnosis not present

## 2021-05-14 DIAGNOSIS — G40909 Epilepsy, unspecified, not intractable, without status epilepticus: Secondary | ICD-10-CM | POA: Diagnosis not present

## 2021-05-14 DIAGNOSIS — J9601 Acute respiratory failure with hypoxia: Secondary | ICD-10-CM | POA: Diagnosis not present

## 2021-05-14 DIAGNOSIS — Z794 Long term (current) use of insulin: Secondary | ICD-10-CM | POA: Diagnosis not present

## 2021-05-14 DIAGNOSIS — Z9181 History of falling: Secondary | ICD-10-CM | POA: Diagnosis not present

## 2021-05-19 DIAGNOSIS — N179 Acute kidney failure, unspecified: Secondary | ICD-10-CM | POA: Diagnosis not present

## 2021-05-19 DIAGNOSIS — F0150 Vascular dementia without behavioral disturbance: Secondary | ICD-10-CM | POA: Diagnosis not present

## 2021-05-19 DIAGNOSIS — R0781 Pleurodynia: Secondary | ICD-10-CM | POA: Diagnosis not present

## 2021-05-20 DIAGNOSIS — I700 Atherosclerosis of aorta: Secondary | ICD-10-CM | POA: Diagnosis not present

## 2021-05-20 DIAGNOSIS — I5032 Chronic diastolic (congestive) heart failure: Secondary | ICD-10-CM | POA: Diagnosis not present

## 2021-05-20 DIAGNOSIS — G20 Parkinson's disease: Secondary | ICD-10-CM | POA: Diagnosis not present

## 2021-05-20 DIAGNOSIS — G40909 Epilepsy, unspecified, not intractable, without status epilepticus: Secondary | ICD-10-CM | POA: Diagnosis not present

## 2021-05-20 DIAGNOSIS — E1151 Type 2 diabetes mellitus with diabetic peripheral angiopathy without gangrene: Secondary | ICD-10-CM | POA: Diagnosis not present

## 2021-05-20 DIAGNOSIS — Z794 Long term (current) use of insulin: Secondary | ICD-10-CM | POA: Diagnosis not present

## 2021-05-20 DIAGNOSIS — E039 Hypothyroidism, unspecified: Secondary | ICD-10-CM | POA: Diagnosis not present

## 2021-05-20 DIAGNOSIS — Z8673 Personal history of transient ischemic attack (TIA), and cerebral infarction without residual deficits: Secondary | ICD-10-CM | POA: Diagnosis not present

## 2021-05-20 DIAGNOSIS — K811 Chronic cholecystitis: Secondary | ICD-10-CM | POA: Diagnosis not present

## 2021-05-20 DIAGNOSIS — E1122 Type 2 diabetes mellitus with diabetic chronic kidney disease: Secondary | ICD-10-CM | POA: Diagnosis not present

## 2021-05-20 DIAGNOSIS — S2231XD Fracture of one rib, right side, subsequent encounter for fracture with routine healing: Secondary | ICD-10-CM | POA: Diagnosis not present

## 2021-05-20 DIAGNOSIS — E782 Mixed hyperlipidemia: Secondary | ICD-10-CM | POA: Diagnosis not present

## 2021-05-20 DIAGNOSIS — I130 Hypertensive heart and chronic kidney disease with heart failure and stage 1 through stage 4 chronic kidney disease, or unspecified chronic kidney disease: Secondary | ICD-10-CM | POA: Diagnosis not present

## 2021-05-20 DIAGNOSIS — Z9181 History of falling: Secondary | ICD-10-CM | POA: Diagnosis not present

## 2021-05-20 DIAGNOSIS — J9601 Acute respiratory failure with hypoxia: Secondary | ICD-10-CM | POA: Diagnosis not present

## 2021-05-20 DIAGNOSIS — N1832 Chronic kidney disease, stage 3b: Secondary | ICD-10-CM | POA: Diagnosis not present

## 2021-05-27 DIAGNOSIS — E782 Mixed hyperlipidemia: Secondary | ICD-10-CM | POA: Diagnosis not present

## 2021-05-27 DIAGNOSIS — E1122 Type 2 diabetes mellitus with diabetic chronic kidney disease: Secondary | ICD-10-CM | POA: Diagnosis not present

## 2021-05-27 DIAGNOSIS — G20 Parkinson's disease: Secondary | ICD-10-CM | POA: Diagnosis not present

## 2021-05-27 DIAGNOSIS — Z8673 Personal history of transient ischemic attack (TIA), and cerebral infarction without residual deficits: Secondary | ICD-10-CM | POA: Diagnosis not present

## 2021-05-27 DIAGNOSIS — E1151 Type 2 diabetes mellitus with diabetic peripheral angiopathy without gangrene: Secondary | ICD-10-CM | POA: Diagnosis not present

## 2021-05-27 DIAGNOSIS — K811 Chronic cholecystitis: Secondary | ICD-10-CM | POA: Diagnosis not present

## 2021-05-27 DIAGNOSIS — I700 Atherosclerosis of aorta: Secondary | ICD-10-CM | POA: Diagnosis not present

## 2021-05-27 DIAGNOSIS — I5032 Chronic diastolic (congestive) heart failure: Secondary | ICD-10-CM | POA: Diagnosis not present

## 2021-05-27 DIAGNOSIS — N1832 Chronic kidney disease, stage 3b: Secondary | ICD-10-CM | POA: Diagnosis not present

## 2021-05-27 DIAGNOSIS — J9601 Acute respiratory failure with hypoxia: Secondary | ICD-10-CM | POA: Diagnosis not present

## 2021-05-27 DIAGNOSIS — Z794 Long term (current) use of insulin: Secondary | ICD-10-CM | POA: Diagnosis not present

## 2021-05-27 DIAGNOSIS — Z9181 History of falling: Secondary | ICD-10-CM | POA: Diagnosis not present

## 2021-05-27 DIAGNOSIS — S2231XD Fracture of one rib, right side, subsequent encounter for fracture with routine healing: Secondary | ICD-10-CM | POA: Diagnosis not present

## 2021-05-27 DIAGNOSIS — E039 Hypothyroidism, unspecified: Secondary | ICD-10-CM | POA: Diagnosis not present

## 2021-05-27 DIAGNOSIS — I130 Hypertensive heart and chronic kidney disease with heart failure and stage 1 through stage 4 chronic kidney disease, or unspecified chronic kidney disease: Secondary | ICD-10-CM | POA: Diagnosis not present

## 2021-05-27 DIAGNOSIS — G40909 Epilepsy, unspecified, not intractable, without status epilepticus: Secondary | ICD-10-CM | POA: Diagnosis not present

## 2021-05-28 DIAGNOSIS — G20 Parkinson's disease: Secondary | ICD-10-CM | POA: Diagnosis not present

## 2021-05-28 DIAGNOSIS — G40909 Epilepsy, unspecified, not intractable, without status epilepticus: Secondary | ICD-10-CM | POA: Diagnosis not present

## 2021-05-28 DIAGNOSIS — I5032 Chronic diastolic (congestive) heart failure: Secondary | ICD-10-CM | POA: Diagnosis not present

## 2021-05-28 DIAGNOSIS — K811 Chronic cholecystitis: Secondary | ICD-10-CM | POA: Diagnosis not present

## 2021-05-28 DIAGNOSIS — Z9181 History of falling: Secondary | ICD-10-CM | POA: Diagnosis not present

## 2021-05-28 DIAGNOSIS — E039 Hypothyroidism, unspecified: Secondary | ICD-10-CM | POA: Diagnosis not present

## 2021-05-28 DIAGNOSIS — Z794 Long term (current) use of insulin: Secondary | ICD-10-CM | POA: Diagnosis not present

## 2021-05-28 DIAGNOSIS — Z8673 Personal history of transient ischemic attack (TIA), and cerebral infarction without residual deficits: Secondary | ICD-10-CM | POA: Diagnosis not present

## 2021-05-28 DIAGNOSIS — E1122 Type 2 diabetes mellitus with diabetic chronic kidney disease: Secondary | ICD-10-CM | POA: Diagnosis not present

## 2021-05-28 DIAGNOSIS — E1151 Type 2 diabetes mellitus with diabetic peripheral angiopathy without gangrene: Secondary | ICD-10-CM | POA: Diagnosis not present

## 2021-05-28 DIAGNOSIS — I700 Atherosclerosis of aorta: Secondary | ICD-10-CM | POA: Diagnosis not present

## 2021-05-28 DIAGNOSIS — N1832 Chronic kidney disease, stage 3b: Secondary | ICD-10-CM | POA: Diagnosis not present

## 2021-05-28 DIAGNOSIS — I130 Hypertensive heart and chronic kidney disease with heart failure and stage 1 through stage 4 chronic kidney disease, or unspecified chronic kidney disease: Secondary | ICD-10-CM | POA: Diagnosis not present

## 2021-05-28 DIAGNOSIS — J9601 Acute respiratory failure with hypoxia: Secondary | ICD-10-CM | POA: Diagnosis not present

## 2021-05-28 DIAGNOSIS — S2231XD Fracture of one rib, right side, subsequent encounter for fracture with routine healing: Secondary | ICD-10-CM | POA: Diagnosis not present

## 2021-05-28 DIAGNOSIS — E782 Mixed hyperlipidemia: Secondary | ICD-10-CM | POA: Diagnosis not present

## 2021-05-29 DIAGNOSIS — S2231XD Fracture of one rib, right side, subsequent encounter for fracture with routine healing: Secondary | ICD-10-CM | POA: Diagnosis not present

## 2021-05-29 DIAGNOSIS — E1151 Type 2 diabetes mellitus with diabetic peripheral angiopathy without gangrene: Secondary | ICD-10-CM | POA: Diagnosis not present

## 2021-05-29 DIAGNOSIS — E782 Mixed hyperlipidemia: Secondary | ICD-10-CM | POA: Diagnosis not present

## 2021-05-29 DIAGNOSIS — K811 Chronic cholecystitis: Secondary | ICD-10-CM | POA: Diagnosis not present

## 2021-05-29 DIAGNOSIS — Z9181 History of falling: Secondary | ICD-10-CM | POA: Diagnosis not present

## 2021-05-29 DIAGNOSIS — Z8673 Personal history of transient ischemic attack (TIA), and cerebral infarction without residual deficits: Secondary | ICD-10-CM | POA: Diagnosis not present

## 2021-05-29 DIAGNOSIS — J9601 Acute respiratory failure with hypoxia: Secondary | ICD-10-CM | POA: Diagnosis not present

## 2021-05-29 DIAGNOSIS — E1122 Type 2 diabetes mellitus with diabetic chronic kidney disease: Secondary | ICD-10-CM | POA: Diagnosis not present

## 2021-05-29 DIAGNOSIS — G40909 Epilepsy, unspecified, not intractable, without status epilepticus: Secondary | ICD-10-CM | POA: Diagnosis not present

## 2021-05-29 DIAGNOSIS — E039 Hypothyroidism, unspecified: Secondary | ICD-10-CM | POA: Diagnosis not present

## 2021-05-29 DIAGNOSIS — N1832 Chronic kidney disease, stage 3b: Secondary | ICD-10-CM | POA: Diagnosis not present

## 2021-05-29 DIAGNOSIS — G20 Parkinson's disease: Secondary | ICD-10-CM | POA: Diagnosis not present

## 2021-05-29 DIAGNOSIS — I5032 Chronic diastolic (congestive) heart failure: Secondary | ICD-10-CM | POA: Diagnosis not present

## 2021-05-29 DIAGNOSIS — I700 Atherosclerosis of aorta: Secondary | ICD-10-CM | POA: Diagnosis not present

## 2021-05-29 DIAGNOSIS — I130 Hypertensive heart and chronic kidney disease with heart failure and stage 1 through stage 4 chronic kidney disease, or unspecified chronic kidney disease: Secondary | ICD-10-CM | POA: Diagnosis not present

## 2021-05-29 DIAGNOSIS — Z794 Long term (current) use of insulin: Secondary | ICD-10-CM | POA: Diagnosis not present

## 2021-05-31 DIAGNOSIS — E162 Hypoglycemia, unspecified: Secondary | ICD-10-CM | POA: Diagnosis not present

## 2021-05-31 DIAGNOSIS — R404 Transient alteration of awareness: Secondary | ICD-10-CM | POA: Diagnosis not present

## 2021-05-31 DIAGNOSIS — R4182 Altered mental status, unspecified: Secondary | ICD-10-CM | POA: Diagnosis not present

## 2021-05-31 DIAGNOSIS — E161 Other hypoglycemia: Secondary | ICD-10-CM | POA: Diagnosis not present

## 2021-05-31 DIAGNOSIS — R4020 Unspecified coma: Secondary | ICD-10-CM | POA: Diagnosis not present

## 2021-06-11 DIAGNOSIS — E11649 Type 2 diabetes mellitus with hypoglycemia without coma: Secondary | ICD-10-CM | POA: Diagnosis not present

## 2021-06-11 DIAGNOSIS — E1151 Type 2 diabetes mellitus with diabetic peripheral angiopathy without gangrene: Secondary | ICD-10-CM | POA: Diagnosis not present

## 2021-06-11 DIAGNOSIS — Z0000 Encounter for general adult medical examination without abnormal findings: Secondary | ICD-10-CM | POA: Diagnosis not present

## 2021-06-11 DIAGNOSIS — N1831 Chronic kidney disease, stage 3a: Secondary | ICD-10-CM | POA: Diagnosis not present

## 2021-06-12 DIAGNOSIS — G20 Parkinson's disease: Secondary | ICD-10-CM | POA: Diagnosis not present

## 2021-06-12 DIAGNOSIS — G40909 Epilepsy, unspecified, not intractable, without status epilepticus: Secondary | ICD-10-CM | POA: Diagnosis not present

## 2021-06-12 DIAGNOSIS — E039 Hypothyroidism, unspecified: Secondary | ICD-10-CM | POA: Diagnosis not present

## 2021-06-12 DIAGNOSIS — N1832 Chronic kidney disease, stage 3b: Secondary | ICD-10-CM | POA: Diagnosis not present

## 2021-06-12 DIAGNOSIS — S2231XD Fracture of one rib, right side, subsequent encounter for fracture with routine healing: Secondary | ICD-10-CM | POA: Diagnosis not present

## 2021-06-12 DIAGNOSIS — E1151 Type 2 diabetes mellitus with diabetic peripheral angiopathy without gangrene: Secondary | ICD-10-CM | POA: Diagnosis not present

## 2021-06-12 DIAGNOSIS — Z794 Long term (current) use of insulin: Secondary | ICD-10-CM | POA: Diagnosis not present

## 2021-06-12 DIAGNOSIS — I700 Atherosclerosis of aorta: Secondary | ICD-10-CM | POA: Diagnosis not present

## 2021-06-12 DIAGNOSIS — J9601 Acute respiratory failure with hypoxia: Secondary | ICD-10-CM | POA: Diagnosis not present

## 2021-06-12 DIAGNOSIS — I130 Hypertensive heart and chronic kidney disease with heart failure and stage 1 through stage 4 chronic kidney disease, or unspecified chronic kidney disease: Secondary | ICD-10-CM | POA: Diagnosis not present

## 2021-06-12 DIAGNOSIS — E1122 Type 2 diabetes mellitus with diabetic chronic kidney disease: Secondary | ICD-10-CM | POA: Diagnosis not present

## 2021-06-12 DIAGNOSIS — I5032 Chronic diastolic (congestive) heart failure: Secondary | ICD-10-CM | POA: Diagnosis not present

## 2021-06-12 DIAGNOSIS — K811 Chronic cholecystitis: Secondary | ICD-10-CM | POA: Diagnosis not present

## 2021-06-12 DIAGNOSIS — Z9181 History of falling: Secondary | ICD-10-CM | POA: Diagnosis not present

## 2021-06-12 DIAGNOSIS — E782 Mixed hyperlipidemia: Secondary | ICD-10-CM | POA: Diagnosis not present

## 2021-06-12 DIAGNOSIS — Z8673 Personal history of transient ischemic attack (TIA), and cerebral infarction without residual deficits: Secondary | ICD-10-CM | POA: Diagnosis not present

## 2021-06-15 ENCOUNTER — Other Ambulatory Visit: Payer: Self-pay | Admitting: Student in an Organized Health Care Education/Training Program

## 2021-06-22 DIAGNOSIS — R4189 Other symptoms and signs involving cognitive functions and awareness: Secondary | ICD-10-CM | POA: Diagnosis not present

## 2021-06-22 DIAGNOSIS — R296 Repeated falls: Secondary | ICD-10-CM | POA: Diagnosis not present

## 2021-06-22 DIAGNOSIS — Z79899 Other long term (current) drug therapy: Secondary | ICD-10-CM | POA: Diagnosis not present

## 2021-06-22 DIAGNOSIS — I951 Orthostatic hypotension: Secondary | ICD-10-CM | POA: Diagnosis not present

## 2021-06-22 DIAGNOSIS — R42 Dizziness and giddiness: Secondary | ICD-10-CM | POA: Diagnosis not present

## 2021-06-22 DIAGNOSIS — G40909 Epilepsy, unspecified, not intractable, without status epilepticus: Secondary | ICD-10-CM | POA: Diagnosis not present

## 2021-06-22 DIAGNOSIS — R630 Anorexia: Secondary | ICD-10-CM | POA: Diagnosis not present

## 2021-06-22 DIAGNOSIS — G20 Parkinson's disease: Secondary | ICD-10-CM | POA: Diagnosis not present

## 2021-06-24 DIAGNOSIS — Z8673 Personal history of transient ischemic attack (TIA), and cerebral infarction without residual deficits: Secondary | ICD-10-CM | POA: Diagnosis not present

## 2021-06-24 DIAGNOSIS — E1122 Type 2 diabetes mellitus with diabetic chronic kidney disease: Secondary | ICD-10-CM | POA: Diagnosis not present

## 2021-06-24 DIAGNOSIS — N1832 Chronic kidney disease, stage 3b: Secondary | ICD-10-CM | POA: Diagnosis not present

## 2021-06-24 DIAGNOSIS — J9601 Acute respiratory failure with hypoxia: Secondary | ICD-10-CM | POA: Diagnosis not present

## 2021-06-24 DIAGNOSIS — I700 Atherosclerosis of aorta: Secondary | ICD-10-CM | POA: Diagnosis not present

## 2021-06-24 DIAGNOSIS — E1151 Type 2 diabetes mellitus with diabetic peripheral angiopathy without gangrene: Secondary | ICD-10-CM | POA: Diagnosis not present

## 2021-06-24 DIAGNOSIS — I130 Hypertensive heart and chronic kidney disease with heart failure and stage 1 through stage 4 chronic kidney disease, or unspecified chronic kidney disease: Secondary | ICD-10-CM | POA: Diagnosis not present

## 2021-06-24 DIAGNOSIS — Z794 Long term (current) use of insulin: Secondary | ICD-10-CM | POA: Diagnosis not present

## 2021-06-24 DIAGNOSIS — S2231XD Fracture of one rib, right side, subsequent encounter for fracture with routine healing: Secondary | ICD-10-CM | POA: Diagnosis not present

## 2021-06-24 DIAGNOSIS — I5032 Chronic diastolic (congestive) heart failure: Secondary | ICD-10-CM | POA: Diagnosis not present

## 2021-06-24 DIAGNOSIS — Z9181 History of falling: Secondary | ICD-10-CM | POA: Diagnosis not present

## 2021-06-24 DIAGNOSIS — K811 Chronic cholecystitis: Secondary | ICD-10-CM | POA: Diagnosis not present

## 2021-06-24 DIAGNOSIS — E039 Hypothyroidism, unspecified: Secondary | ICD-10-CM | POA: Diagnosis not present

## 2021-06-24 DIAGNOSIS — E782 Mixed hyperlipidemia: Secondary | ICD-10-CM | POA: Diagnosis not present

## 2021-06-24 DIAGNOSIS — G40909 Epilepsy, unspecified, not intractable, without status epilepticus: Secondary | ICD-10-CM | POA: Diagnosis not present

## 2021-06-24 DIAGNOSIS — G20 Parkinson's disease: Secondary | ICD-10-CM | POA: Diagnosis not present

## 2021-06-26 DIAGNOSIS — E782 Mixed hyperlipidemia: Secondary | ICD-10-CM | POA: Diagnosis not present

## 2021-06-26 DIAGNOSIS — Z794 Long term (current) use of insulin: Secondary | ICD-10-CM | POA: Diagnosis not present

## 2021-06-26 DIAGNOSIS — E1151 Type 2 diabetes mellitus with diabetic peripheral angiopathy without gangrene: Secondary | ICD-10-CM | POA: Diagnosis not present

## 2021-06-26 DIAGNOSIS — I700 Atherosclerosis of aorta: Secondary | ICD-10-CM | POA: Diagnosis not present

## 2021-06-26 DIAGNOSIS — I130 Hypertensive heart and chronic kidney disease with heart failure and stage 1 through stage 4 chronic kidney disease, or unspecified chronic kidney disease: Secondary | ICD-10-CM | POA: Diagnosis not present

## 2021-06-26 DIAGNOSIS — K811 Chronic cholecystitis: Secondary | ICD-10-CM | POA: Diagnosis not present

## 2021-06-26 DIAGNOSIS — Z8673 Personal history of transient ischemic attack (TIA), and cerebral infarction without residual deficits: Secondary | ICD-10-CM | POA: Diagnosis not present

## 2021-06-26 DIAGNOSIS — N1832 Chronic kidney disease, stage 3b: Secondary | ICD-10-CM | POA: Diagnosis not present

## 2021-06-26 DIAGNOSIS — J9601 Acute respiratory failure with hypoxia: Secondary | ICD-10-CM | POA: Diagnosis not present

## 2021-06-26 DIAGNOSIS — G40909 Epilepsy, unspecified, not intractable, without status epilepticus: Secondary | ICD-10-CM | POA: Diagnosis not present

## 2021-06-26 DIAGNOSIS — E1122 Type 2 diabetes mellitus with diabetic chronic kidney disease: Secondary | ICD-10-CM | POA: Diagnosis not present

## 2021-06-26 DIAGNOSIS — G20 Parkinson's disease: Secondary | ICD-10-CM | POA: Diagnosis not present

## 2021-06-26 DIAGNOSIS — S2231XD Fracture of one rib, right side, subsequent encounter for fracture with routine healing: Secondary | ICD-10-CM | POA: Diagnosis not present

## 2021-06-26 DIAGNOSIS — Z9181 History of falling: Secondary | ICD-10-CM | POA: Diagnosis not present

## 2021-06-26 DIAGNOSIS — I5032 Chronic diastolic (congestive) heart failure: Secondary | ICD-10-CM | POA: Diagnosis not present

## 2021-06-26 DIAGNOSIS — E039 Hypothyroidism, unspecified: Secondary | ICD-10-CM | POA: Diagnosis not present

## 2021-06-30 DIAGNOSIS — E1151 Type 2 diabetes mellitus with diabetic peripheral angiopathy without gangrene: Secondary | ICD-10-CM | POA: Diagnosis not present

## 2021-06-30 DIAGNOSIS — E11649 Type 2 diabetes mellitus with hypoglycemia without coma: Secondary | ICD-10-CM | POA: Diagnosis not present

## 2021-06-30 DIAGNOSIS — Z125 Encounter for screening for malignant neoplasm of prostate: Secondary | ICD-10-CM | POA: Diagnosis not present

## 2021-07-07 DIAGNOSIS — N184 Chronic kidney disease, stage 4 (severe): Secondary | ICD-10-CM | POA: Diagnosis not present

## 2021-07-07 DIAGNOSIS — Z0000 Encounter for general adult medical examination without abnormal findings: Secondary | ICD-10-CM | POA: Diagnosis not present

## 2021-07-07 DIAGNOSIS — N179 Acute kidney failure, unspecified: Secondary | ICD-10-CM | POA: Diagnosis not present

## 2021-07-07 DIAGNOSIS — E1151 Type 2 diabetes mellitus with diabetic peripheral angiopathy without gangrene: Secondary | ICD-10-CM | POA: Diagnosis not present

## 2021-07-13 DIAGNOSIS — N1832 Chronic kidney disease, stage 3b: Secondary | ICD-10-CM | POA: Diagnosis not present

## 2021-07-13 DIAGNOSIS — I130 Hypertensive heart and chronic kidney disease with heart failure and stage 1 through stage 4 chronic kidney disease, or unspecified chronic kidney disease: Secondary | ICD-10-CM | POA: Diagnosis not present

## 2021-07-13 DIAGNOSIS — E1151 Type 2 diabetes mellitus with diabetic peripheral angiopathy without gangrene: Secondary | ICD-10-CM | POA: Diagnosis not present

## 2021-07-13 DIAGNOSIS — J9601 Acute respiratory failure with hypoxia: Secondary | ICD-10-CM | POA: Diagnosis not present

## 2021-07-13 DIAGNOSIS — G20 Parkinson's disease: Secondary | ICD-10-CM | POA: Diagnosis not present

## 2021-07-13 DIAGNOSIS — E039 Hypothyroidism, unspecified: Secondary | ICD-10-CM | POA: Diagnosis not present

## 2021-07-13 DIAGNOSIS — K811 Chronic cholecystitis: Secondary | ICD-10-CM | POA: Diagnosis not present

## 2021-07-13 DIAGNOSIS — I5032 Chronic diastolic (congestive) heart failure: Secondary | ICD-10-CM | POA: Diagnosis not present

## 2021-07-13 DIAGNOSIS — Z9181 History of falling: Secondary | ICD-10-CM | POA: Diagnosis not present

## 2021-07-13 DIAGNOSIS — G40909 Epilepsy, unspecified, not intractable, without status epilepticus: Secondary | ICD-10-CM | POA: Diagnosis not present

## 2021-07-13 DIAGNOSIS — I700 Atherosclerosis of aorta: Secondary | ICD-10-CM | POA: Diagnosis not present

## 2021-07-13 DIAGNOSIS — Z8673 Personal history of transient ischemic attack (TIA), and cerebral infarction without residual deficits: Secondary | ICD-10-CM | POA: Diagnosis not present

## 2021-07-13 DIAGNOSIS — E782 Mixed hyperlipidemia: Secondary | ICD-10-CM | POA: Diagnosis not present

## 2021-07-13 DIAGNOSIS — S2231XD Fracture of one rib, right side, subsequent encounter for fracture with routine healing: Secondary | ICD-10-CM | POA: Diagnosis not present

## 2021-07-13 DIAGNOSIS — Z794 Long term (current) use of insulin: Secondary | ICD-10-CM | POA: Diagnosis not present

## 2021-07-13 DIAGNOSIS — E1122 Type 2 diabetes mellitus with diabetic chronic kidney disease: Secondary | ICD-10-CM | POA: Diagnosis not present

## 2021-07-16 DIAGNOSIS — G20 Parkinson's disease: Secondary | ICD-10-CM | POA: Diagnosis not present

## 2021-07-25 DIAGNOSIS — G40909 Epilepsy, unspecified, not intractable, without status epilepticus: Secondary | ICD-10-CM | POA: Diagnosis not present

## 2021-07-25 DIAGNOSIS — S2231XD Fracture of one rib, right side, subsequent encounter for fracture with routine healing: Secondary | ICD-10-CM | POA: Diagnosis not present

## 2021-07-25 DIAGNOSIS — N1832 Chronic kidney disease, stage 3b: Secondary | ICD-10-CM | POA: Diagnosis not present

## 2021-07-25 DIAGNOSIS — Z794 Long term (current) use of insulin: Secondary | ICD-10-CM | POA: Diagnosis not present

## 2021-07-25 DIAGNOSIS — I5032 Chronic diastolic (congestive) heart failure: Secondary | ICD-10-CM | POA: Diagnosis not present

## 2021-07-25 DIAGNOSIS — Z9181 History of falling: Secondary | ICD-10-CM | POA: Diagnosis not present

## 2021-07-25 DIAGNOSIS — E1151 Type 2 diabetes mellitus with diabetic peripheral angiopathy without gangrene: Secondary | ICD-10-CM | POA: Diagnosis not present

## 2021-07-25 DIAGNOSIS — I130 Hypertensive heart and chronic kidney disease with heart failure and stage 1 through stage 4 chronic kidney disease, or unspecified chronic kidney disease: Secondary | ICD-10-CM | POA: Diagnosis not present

## 2021-07-25 DIAGNOSIS — K811 Chronic cholecystitis: Secondary | ICD-10-CM | POA: Diagnosis not present

## 2021-07-25 DIAGNOSIS — E782 Mixed hyperlipidemia: Secondary | ICD-10-CM | POA: Diagnosis not present

## 2021-07-25 DIAGNOSIS — G20 Parkinson's disease: Secondary | ICD-10-CM | POA: Diagnosis not present

## 2021-07-25 DIAGNOSIS — I700 Atherosclerosis of aorta: Secondary | ICD-10-CM | POA: Diagnosis not present

## 2021-07-25 DIAGNOSIS — E039 Hypothyroidism, unspecified: Secondary | ICD-10-CM | POA: Diagnosis not present

## 2021-07-25 DIAGNOSIS — J9601 Acute respiratory failure with hypoxia: Secondary | ICD-10-CM | POA: Diagnosis not present

## 2021-07-25 DIAGNOSIS — E1122 Type 2 diabetes mellitus with diabetic chronic kidney disease: Secondary | ICD-10-CM | POA: Diagnosis not present

## 2021-07-25 DIAGNOSIS — Z8673 Personal history of transient ischemic attack (TIA), and cerebral infarction without residual deficits: Secondary | ICD-10-CM | POA: Diagnosis not present

## 2021-08-05 DIAGNOSIS — Z794 Long term (current) use of insulin: Secondary | ICD-10-CM | POA: Diagnosis not present

## 2021-08-05 DIAGNOSIS — E1151 Type 2 diabetes mellitus with diabetic peripheral angiopathy without gangrene: Secondary | ICD-10-CM | POA: Diagnosis not present

## 2021-08-05 DIAGNOSIS — Z8673 Personal history of transient ischemic attack (TIA), and cerebral infarction without residual deficits: Secondary | ICD-10-CM | POA: Diagnosis not present

## 2021-08-05 DIAGNOSIS — I130 Hypertensive heart and chronic kidney disease with heart failure and stage 1 through stage 4 chronic kidney disease, or unspecified chronic kidney disease: Secondary | ICD-10-CM | POA: Diagnosis not present

## 2021-08-05 DIAGNOSIS — G20 Parkinson's disease: Secondary | ICD-10-CM | POA: Diagnosis not present

## 2021-08-05 DIAGNOSIS — E1122 Type 2 diabetes mellitus with diabetic chronic kidney disease: Secondary | ICD-10-CM | POA: Diagnosis not present

## 2021-08-05 DIAGNOSIS — J9601 Acute respiratory failure with hypoxia: Secondary | ICD-10-CM | POA: Diagnosis not present

## 2021-08-05 DIAGNOSIS — E039 Hypothyroidism, unspecified: Secondary | ICD-10-CM | POA: Diagnosis not present

## 2021-08-05 DIAGNOSIS — E782 Mixed hyperlipidemia: Secondary | ICD-10-CM | POA: Diagnosis not present

## 2021-08-05 DIAGNOSIS — K811 Chronic cholecystitis: Secondary | ICD-10-CM | POA: Diagnosis not present

## 2021-08-05 DIAGNOSIS — I5032 Chronic diastolic (congestive) heart failure: Secondary | ICD-10-CM | POA: Diagnosis not present

## 2021-08-05 DIAGNOSIS — G40909 Epilepsy, unspecified, not intractable, without status epilepticus: Secondary | ICD-10-CM | POA: Diagnosis not present

## 2021-08-05 DIAGNOSIS — S2231XD Fracture of one rib, right side, subsequent encounter for fracture with routine healing: Secondary | ICD-10-CM | POA: Diagnosis not present

## 2021-08-05 DIAGNOSIS — N1832 Chronic kidney disease, stage 3b: Secondary | ICD-10-CM | POA: Diagnosis not present

## 2021-08-05 DIAGNOSIS — Z9181 History of falling: Secondary | ICD-10-CM | POA: Diagnosis not present

## 2021-08-05 DIAGNOSIS — I700 Atherosclerosis of aorta: Secondary | ICD-10-CM | POA: Diagnosis not present

## 2021-08-06 DIAGNOSIS — G40909 Epilepsy, unspecified, not intractable, without status epilepticus: Secondary | ICD-10-CM | POA: Diagnosis not present

## 2021-08-06 DIAGNOSIS — S2231XD Fracture of one rib, right side, subsequent encounter for fracture with routine healing: Secondary | ICD-10-CM | POA: Diagnosis not present

## 2021-08-06 DIAGNOSIS — E1122 Type 2 diabetes mellitus with diabetic chronic kidney disease: Secondary | ICD-10-CM | POA: Diagnosis not present

## 2021-08-06 DIAGNOSIS — I5032 Chronic diastolic (congestive) heart failure: Secondary | ICD-10-CM | POA: Diagnosis not present

## 2021-08-06 DIAGNOSIS — N1832 Chronic kidney disease, stage 3b: Secondary | ICD-10-CM | POA: Diagnosis not present

## 2021-08-06 DIAGNOSIS — E039 Hypothyroidism, unspecified: Secondary | ICD-10-CM | POA: Diagnosis not present

## 2021-08-06 DIAGNOSIS — E1151 Type 2 diabetes mellitus with diabetic peripheral angiopathy without gangrene: Secondary | ICD-10-CM | POA: Diagnosis not present

## 2021-08-06 DIAGNOSIS — Z8673 Personal history of transient ischemic attack (TIA), and cerebral infarction without residual deficits: Secondary | ICD-10-CM | POA: Diagnosis not present

## 2021-08-06 DIAGNOSIS — K811 Chronic cholecystitis: Secondary | ICD-10-CM | POA: Diagnosis not present

## 2021-08-06 DIAGNOSIS — Z9181 History of falling: Secondary | ICD-10-CM | POA: Diagnosis not present

## 2021-08-06 DIAGNOSIS — E782 Mixed hyperlipidemia: Secondary | ICD-10-CM | POA: Diagnosis not present

## 2021-08-06 DIAGNOSIS — Z794 Long term (current) use of insulin: Secondary | ICD-10-CM | POA: Diagnosis not present

## 2021-08-06 DIAGNOSIS — I130 Hypertensive heart and chronic kidney disease with heart failure and stage 1 through stage 4 chronic kidney disease, or unspecified chronic kidney disease: Secondary | ICD-10-CM | POA: Diagnosis not present

## 2021-08-06 DIAGNOSIS — I700 Atherosclerosis of aorta: Secondary | ICD-10-CM | POA: Diagnosis not present

## 2021-08-06 DIAGNOSIS — J9601 Acute respiratory failure with hypoxia: Secondary | ICD-10-CM | POA: Diagnosis not present

## 2021-08-06 DIAGNOSIS — G20 Parkinson's disease: Secondary | ICD-10-CM | POA: Diagnosis not present

## 2021-08-07 DIAGNOSIS — N184 Chronic kidney disease, stage 4 (severe): Secondary | ICD-10-CM | POA: Diagnosis not present

## 2021-08-19 DIAGNOSIS — N179 Acute kidney failure, unspecified: Secondary | ICD-10-CM | POA: Diagnosis not present

## 2021-08-20 DIAGNOSIS — G40909 Epilepsy, unspecified, not intractable, without status epilepticus: Secondary | ICD-10-CM | POA: Diagnosis not present

## 2021-08-20 DIAGNOSIS — I5032 Chronic diastolic (congestive) heart failure: Secondary | ICD-10-CM | POA: Diagnosis not present

## 2021-08-20 DIAGNOSIS — Z9181 History of falling: Secondary | ICD-10-CM | POA: Diagnosis not present

## 2021-08-20 DIAGNOSIS — J9601 Acute respiratory failure with hypoxia: Secondary | ICD-10-CM | POA: Diagnosis not present

## 2021-08-20 DIAGNOSIS — I130 Hypertensive heart and chronic kidney disease with heart failure and stage 1 through stage 4 chronic kidney disease, or unspecified chronic kidney disease: Secondary | ICD-10-CM | POA: Diagnosis not present

## 2021-08-20 DIAGNOSIS — E039 Hypothyroidism, unspecified: Secondary | ICD-10-CM | POA: Diagnosis not present

## 2021-08-20 DIAGNOSIS — E782 Mixed hyperlipidemia: Secondary | ICD-10-CM | POA: Diagnosis not present

## 2021-08-20 DIAGNOSIS — E1122 Type 2 diabetes mellitus with diabetic chronic kidney disease: Secondary | ICD-10-CM | POA: Diagnosis not present

## 2021-08-20 DIAGNOSIS — I700 Atherosclerosis of aorta: Secondary | ICD-10-CM | POA: Diagnosis not present

## 2021-08-20 DIAGNOSIS — E1151 Type 2 diabetes mellitus with diabetic peripheral angiopathy without gangrene: Secondary | ICD-10-CM | POA: Diagnosis not present

## 2021-08-20 DIAGNOSIS — S2231XD Fracture of one rib, right side, subsequent encounter for fracture with routine healing: Secondary | ICD-10-CM | POA: Diagnosis not present

## 2021-08-20 DIAGNOSIS — Z794 Long term (current) use of insulin: Secondary | ICD-10-CM | POA: Diagnosis not present

## 2021-08-20 DIAGNOSIS — K811 Chronic cholecystitis: Secondary | ICD-10-CM | POA: Diagnosis not present

## 2021-08-20 DIAGNOSIS — N1832 Chronic kidney disease, stage 3b: Secondary | ICD-10-CM | POA: Diagnosis not present

## 2021-08-20 DIAGNOSIS — G20 Parkinson's disease: Secondary | ICD-10-CM | POA: Diagnosis not present

## 2021-08-20 DIAGNOSIS — Z8673 Personal history of transient ischemic attack (TIA), and cerebral infarction without residual deficits: Secondary | ICD-10-CM | POA: Diagnosis not present

## 2021-08-26 DIAGNOSIS — K811 Chronic cholecystitis: Secondary | ICD-10-CM | POA: Diagnosis not present

## 2021-08-26 DIAGNOSIS — E039 Hypothyroidism, unspecified: Secondary | ICD-10-CM | POA: Diagnosis not present

## 2021-08-26 DIAGNOSIS — G40909 Epilepsy, unspecified, not intractable, without status epilepticus: Secondary | ICD-10-CM | POA: Diagnosis not present

## 2021-08-26 DIAGNOSIS — J9601 Acute respiratory failure with hypoxia: Secondary | ICD-10-CM | POA: Diagnosis not present

## 2021-08-26 DIAGNOSIS — E782 Mixed hyperlipidemia: Secondary | ICD-10-CM | POA: Diagnosis not present

## 2021-08-26 DIAGNOSIS — E1151 Type 2 diabetes mellitus with diabetic peripheral angiopathy without gangrene: Secondary | ICD-10-CM | POA: Diagnosis not present

## 2021-08-26 DIAGNOSIS — G20 Parkinson's disease: Secondary | ICD-10-CM | POA: Diagnosis not present

## 2021-08-27 DIAGNOSIS — E1151 Type 2 diabetes mellitus with diabetic peripheral angiopathy without gangrene: Secondary | ICD-10-CM | POA: Diagnosis not present

## 2021-08-27 DIAGNOSIS — Z9181 History of falling: Secondary | ICD-10-CM | POA: Diagnosis not present

## 2021-08-27 DIAGNOSIS — E782 Mixed hyperlipidemia: Secondary | ICD-10-CM | POA: Diagnosis not present

## 2021-08-27 DIAGNOSIS — J9601 Acute respiratory failure with hypoxia: Secondary | ICD-10-CM | POA: Diagnosis not present

## 2021-08-27 DIAGNOSIS — G40909 Epilepsy, unspecified, not intractable, without status epilepticus: Secondary | ICD-10-CM | POA: Diagnosis not present

## 2021-08-27 DIAGNOSIS — S2231XD Fracture of one rib, right side, subsequent encounter for fracture with routine healing: Secondary | ICD-10-CM | POA: Diagnosis not present

## 2021-08-27 DIAGNOSIS — Z794 Long term (current) use of insulin: Secondary | ICD-10-CM | POA: Diagnosis not present

## 2021-08-27 DIAGNOSIS — K811 Chronic cholecystitis: Secondary | ICD-10-CM | POA: Diagnosis not present

## 2021-08-27 DIAGNOSIS — N1832 Chronic kidney disease, stage 3b: Secondary | ICD-10-CM | POA: Diagnosis not present

## 2021-08-27 DIAGNOSIS — Z8673 Personal history of transient ischemic attack (TIA), and cerebral infarction without residual deficits: Secondary | ICD-10-CM | POA: Diagnosis not present

## 2021-08-27 DIAGNOSIS — G20 Parkinson's disease: Secondary | ICD-10-CM | POA: Diagnosis not present

## 2021-08-27 DIAGNOSIS — I700 Atherosclerosis of aorta: Secondary | ICD-10-CM | POA: Diagnosis not present

## 2021-08-27 DIAGNOSIS — E1122 Type 2 diabetes mellitus with diabetic chronic kidney disease: Secondary | ICD-10-CM | POA: Diagnosis not present

## 2021-08-27 DIAGNOSIS — I130 Hypertensive heart and chronic kidney disease with heart failure and stage 1 through stage 4 chronic kidney disease, or unspecified chronic kidney disease: Secondary | ICD-10-CM | POA: Diagnosis not present

## 2021-08-27 DIAGNOSIS — E039 Hypothyroidism, unspecified: Secondary | ICD-10-CM | POA: Diagnosis not present

## 2021-08-27 DIAGNOSIS — I5032 Chronic diastolic (congestive) heart failure: Secondary | ICD-10-CM | POA: Diagnosis not present

## 2021-09-01 DIAGNOSIS — G20 Parkinson's disease: Secondary | ICD-10-CM | POA: Diagnosis not present

## 2021-09-01 DIAGNOSIS — S2231XD Fracture of one rib, right side, subsequent encounter for fracture with routine healing: Secondary | ICD-10-CM | POA: Diagnosis not present

## 2021-09-01 DIAGNOSIS — I5032 Chronic diastolic (congestive) heart failure: Secondary | ICD-10-CM | POA: Diagnosis not present

## 2021-09-01 DIAGNOSIS — I700 Atherosclerosis of aorta: Secondary | ICD-10-CM | POA: Diagnosis not present

## 2021-09-01 DIAGNOSIS — N1832 Chronic kidney disease, stage 3b: Secondary | ICD-10-CM | POA: Diagnosis not present

## 2021-09-01 DIAGNOSIS — E039 Hypothyroidism, unspecified: Secondary | ICD-10-CM | POA: Diagnosis not present

## 2021-09-01 DIAGNOSIS — J9601 Acute respiratory failure with hypoxia: Secondary | ICD-10-CM | POA: Diagnosis not present

## 2021-09-01 DIAGNOSIS — Z8673 Personal history of transient ischemic attack (TIA), and cerebral infarction without residual deficits: Secondary | ICD-10-CM | POA: Diagnosis not present

## 2021-09-01 DIAGNOSIS — E1122 Type 2 diabetes mellitus with diabetic chronic kidney disease: Secondary | ICD-10-CM | POA: Diagnosis not present

## 2021-09-01 DIAGNOSIS — Z9181 History of falling: Secondary | ICD-10-CM | POA: Diagnosis not present

## 2021-09-01 DIAGNOSIS — K811 Chronic cholecystitis: Secondary | ICD-10-CM | POA: Diagnosis not present

## 2021-09-01 DIAGNOSIS — G40909 Epilepsy, unspecified, not intractable, without status epilepticus: Secondary | ICD-10-CM | POA: Diagnosis not present

## 2021-09-01 DIAGNOSIS — E782 Mixed hyperlipidemia: Secondary | ICD-10-CM | POA: Diagnosis not present

## 2021-09-01 DIAGNOSIS — I130 Hypertensive heart and chronic kidney disease with heart failure and stage 1 through stage 4 chronic kidney disease, or unspecified chronic kidney disease: Secondary | ICD-10-CM | POA: Diagnosis not present

## 2021-09-01 DIAGNOSIS — Z794 Long term (current) use of insulin: Secondary | ICD-10-CM | POA: Diagnosis not present

## 2021-09-01 DIAGNOSIS — E1151 Type 2 diabetes mellitus with diabetic peripheral angiopathy without gangrene: Secondary | ICD-10-CM | POA: Diagnosis not present

## 2021-09-09 DIAGNOSIS — K811 Chronic cholecystitis: Secondary | ICD-10-CM | POA: Diagnosis not present

## 2021-09-09 DIAGNOSIS — N1832 Chronic kidney disease, stage 3b: Secondary | ICD-10-CM | POA: Diagnosis not present

## 2021-09-09 DIAGNOSIS — I5032 Chronic diastolic (congestive) heart failure: Secondary | ICD-10-CM | POA: Diagnosis not present

## 2021-09-09 DIAGNOSIS — Z794 Long term (current) use of insulin: Secondary | ICD-10-CM | POA: Diagnosis not present

## 2021-09-09 DIAGNOSIS — G40909 Epilepsy, unspecified, not intractable, without status epilepticus: Secondary | ICD-10-CM | POA: Diagnosis not present

## 2021-09-09 DIAGNOSIS — G20 Parkinson's disease: Secondary | ICD-10-CM | POA: Diagnosis not present

## 2021-09-09 DIAGNOSIS — S2231XD Fracture of one rib, right side, subsequent encounter for fracture with routine healing: Secondary | ICD-10-CM | POA: Diagnosis not present

## 2021-09-09 DIAGNOSIS — E1151 Type 2 diabetes mellitus with diabetic peripheral angiopathy without gangrene: Secondary | ICD-10-CM | POA: Diagnosis not present

## 2021-09-09 DIAGNOSIS — Z8673 Personal history of transient ischemic attack (TIA), and cerebral infarction without residual deficits: Secondary | ICD-10-CM | POA: Diagnosis not present

## 2021-09-09 DIAGNOSIS — E1122 Type 2 diabetes mellitus with diabetic chronic kidney disease: Secondary | ICD-10-CM | POA: Diagnosis not present

## 2021-09-09 DIAGNOSIS — I700 Atherosclerosis of aorta: Secondary | ICD-10-CM | POA: Diagnosis not present

## 2021-09-09 DIAGNOSIS — E782 Mixed hyperlipidemia: Secondary | ICD-10-CM | POA: Diagnosis not present

## 2021-09-09 DIAGNOSIS — J9601 Acute respiratory failure with hypoxia: Secondary | ICD-10-CM | POA: Diagnosis not present

## 2021-09-09 DIAGNOSIS — Z9181 History of falling: Secondary | ICD-10-CM | POA: Diagnosis not present

## 2021-09-09 DIAGNOSIS — E039 Hypothyroidism, unspecified: Secondary | ICD-10-CM | POA: Diagnosis not present

## 2021-09-09 DIAGNOSIS — I130 Hypertensive heart and chronic kidney disease with heart failure and stage 1 through stage 4 chronic kidney disease, or unspecified chronic kidney disease: Secondary | ICD-10-CM | POA: Diagnosis not present

## 2021-10-02 DIAGNOSIS — R270 Ataxia, unspecified: Secondary | ICD-10-CM | POA: Diagnosis not present

## 2021-10-02 DIAGNOSIS — E1151 Type 2 diabetes mellitus with diabetic peripheral angiopathy without gangrene: Secondary | ICD-10-CM | POA: Diagnosis not present

## 2021-10-02 DIAGNOSIS — R432 Parageusia: Secondary | ICD-10-CM | POA: Diagnosis not present

## 2021-10-19 DIAGNOSIS — E871 Hypo-osmolality and hyponatremia: Secondary | ICD-10-CM | POA: Diagnosis not present

## 2021-11-04 DIAGNOSIS — E1151 Type 2 diabetes mellitus with diabetic peripheral angiopathy without gangrene: Secondary | ICD-10-CM | POA: Diagnosis not present

## 2021-11-11 DIAGNOSIS — E1151 Type 2 diabetes mellitus with diabetic peripheral angiopathy without gangrene: Secondary | ICD-10-CM | POA: Diagnosis not present

## 2021-11-11 DIAGNOSIS — E782 Mixed hyperlipidemia: Secondary | ICD-10-CM | POA: Diagnosis not present

## 2021-11-11 DIAGNOSIS — G40909 Epilepsy, unspecified, not intractable, without status epilepticus: Secondary | ICD-10-CM | POA: Diagnosis not present

## 2021-12-14 ENCOUNTER — Inpatient Hospital Stay
Admission: EM | Admit: 2021-12-14 | Discharge: 2021-12-21 | DRG: 481 | Disposition: A | Discharge status: Skilled Nursing Facility | Payer: HMO | Attending: Internal Medicine | Admitting: Internal Medicine

## 2021-12-14 ENCOUNTER — Emergency Department: Payer: HMO

## 2021-12-14 ENCOUNTER — Other Ambulatory Visit: Payer: Self-pay

## 2021-12-14 DIAGNOSIS — E1122 Type 2 diabetes mellitus with diabetic chronic kidney disease: Secondary | ICD-10-CM | POA: Diagnosis present

## 2021-12-14 DIAGNOSIS — E782 Mixed hyperlipidemia: Secondary | ICD-10-CM | POA: Diagnosis not present

## 2021-12-14 DIAGNOSIS — I160 Hypertensive urgency: Secondary | ICD-10-CM | POA: Diagnosis present

## 2021-12-14 DIAGNOSIS — N189 Chronic kidney disease, unspecified: Secondary | ICD-10-CM | POA: Diagnosis present

## 2021-12-14 DIAGNOSIS — S72141A Displaced intertrochanteric fracture of right femur, initial encounter for closed fracture: Secondary | ICD-10-CM | POA: Diagnosis not present

## 2021-12-14 DIAGNOSIS — S72001A Fracture of unspecified part of neck of right femur, initial encounter for closed fracture: Secondary | ICD-10-CM | POA: Diagnosis not present

## 2021-12-14 DIAGNOSIS — N1831 Chronic kidney disease, stage 3a: Secondary | ICD-10-CM | POA: Diagnosis present

## 2021-12-14 DIAGNOSIS — E878 Other disorders of electrolyte and fluid balance, not elsewhere classified: Secondary | ICD-10-CM | POA: Diagnosis present

## 2021-12-14 DIAGNOSIS — Z794 Long term (current) use of insulin: Secondary | ICD-10-CM

## 2021-12-14 DIAGNOSIS — K5900 Constipation, unspecified: Secondary | ICD-10-CM | POA: Diagnosis not present

## 2021-12-14 DIAGNOSIS — Z79899 Other long term (current) drug therapy: Secondary | ICD-10-CM | POA: Diagnosis not present

## 2021-12-14 DIAGNOSIS — R471 Dysarthria and anarthria: Secondary | ICD-10-CM | POA: Diagnosis not present

## 2021-12-14 DIAGNOSIS — S72141D Displaced intertrochanteric fracture of right femur, subsequent encounter for closed fracture with routine healing: Secondary | ICD-10-CM | POA: Diagnosis not present

## 2021-12-14 DIAGNOSIS — N183 Chronic kidney disease, stage 3 unspecified: Secondary | ICD-10-CM | POA: Diagnosis present

## 2021-12-14 DIAGNOSIS — W19XXXA Unspecified fall, initial encounter: Secondary | ICD-10-CM | POA: Diagnosis not present

## 2021-12-14 DIAGNOSIS — F0283 Dementia in other diseases classified elsewhere, unspecified severity, with mood disturbance: Secondary | ICD-10-CM | POA: Diagnosis not present

## 2021-12-14 DIAGNOSIS — E785 Hyperlipidemia, unspecified: Secondary | ICD-10-CM | POA: Diagnosis not present

## 2021-12-14 DIAGNOSIS — Z79890 Hormone replacement therapy (postmenopausal): Secondary | ICD-10-CM | POA: Diagnosis not present

## 2021-12-14 DIAGNOSIS — R1312 Dysphagia, oropharyngeal phase: Secondary | ICD-10-CM | POA: Diagnosis not present

## 2021-12-14 DIAGNOSIS — W19XXXD Unspecified fall, subsequent encounter: Secondary | ICD-10-CM | POA: Diagnosis not present

## 2021-12-14 DIAGNOSIS — M109 Gout, unspecified: Secondary | ICD-10-CM | POA: Diagnosis not present

## 2021-12-14 DIAGNOSIS — W1830XA Fall on same level, unspecified, initial encounter: Secondary | ICD-10-CM | POA: Diagnosis present

## 2021-12-14 DIAGNOSIS — R634 Abnormal weight loss: Secondary | ICD-10-CM | POA: Diagnosis not present

## 2021-12-14 DIAGNOSIS — I2510 Atherosclerotic heart disease of native coronary artery without angina pectoris: Secondary | ICD-10-CM | POA: Diagnosis present

## 2021-12-14 DIAGNOSIS — D62 Acute posthemorrhagic anemia: Secondary | ICD-10-CM | POA: Diagnosis not present

## 2021-12-14 DIAGNOSIS — E876 Hypokalemia: Secondary | ICD-10-CM

## 2021-12-14 DIAGNOSIS — Z888 Allergy status to other drugs, medicaments and biological substances status: Secondary | ICD-10-CM

## 2021-12-14 DIAGNOSIS — E1165 Type 2 diabetes mellitus with hyperglycemia: Secondary | ICD-10-CM | POA: Diagnosis not present

## 2021-12-14 DIAGNOSIS — I459 Conduction disorder, unspecified: Secondary | ICD-10-CM | POA: Diagnosis not present

## 2021-12-14 DIAGNOSIS — I10 Essential (primary) hypertension: Secondary | ICD-10-CM | POA: Diagnosis not present

## 2021-12-14 DIAGNOSIS — Z8673 Personal history of transient ischemic attack (TIA), and cerebral infarction without residual deficits: Secondary | ICD-10-CM

## 2021-12-14 DIAGNOSIS — N179 Acute kidney failure, unspecified: Secondary | ICD-10-CM | POA: Diagnosis present

## 2021-12-14 DIAGNOSIS — I129 Hypertensive chronic kidney disease with stage 1 through stage 4 chronic kidney disease, or unspecified chronic kidney disease: Secondary | ICD-10-CM | POA: Diagnosis present

## 2021-12-14 DIAGNOSIS — M25551 Pain in right hip: Secondary | ICD-10-CM | POA: Diagnosis not present

## 2021-12-14 DIAGNOSIS — I9589 Other hypotension: Secondary | ICD-10-CM | POA: Diagnosis present

## 2021-12-14 DIAGNOSIS — E1129 Type 2 diabetes mellitus with other diabetic kidney complication: Secondary | ICD-10-CM | POA: Diagnosis present

## 2021-12-14 DIAGNOSIS — R1084 Generalized abdominal pain: Secondary | ICD-10-CM | POA: Diagnosis not present

## 2021-12-14 DIAGNOSIS — E039 Hypothyroidism, unspecified: Secondary | ICD-10-CM | POA: Diagnosis present

## 2021-12-14 DIAGNOSIS — R4701 Aphasia: Secondary | ICD-10-CM | POA: Diagnosis not present

## 2021-12-14 DIAGNOSIS — E871 Hypo-osmolality and hyponatremia: Secondary | ICD-10-CM | POA: Diagnosis not present

## 2021-12-14 DIAGNOSIS — G40909 Epilepsy, unspecified, not intractable, without status epilepticus: Secondary | ICD-10-CM | POA: Diagnosis not present

## 2021-12-14 DIAGNOSIS — R053 Chronic cough: Secondary | ICD-10-CM | POA: Diagnosis not present

## 2021-12-14 DIAGNOSIS — I4510 Unspecified right bundle-branch block: Secondary | ICD-10-CM | POA: Diagnosis present

## 2021-12-14 DIAGNOSIS — M62838 Other muscle spasm: Secondary | ICD-10-CM | POA: Diagnosis not present

## 2021-12-14 DIAGNOSIS — E1151 Type 2 diabetes mellitus with diabetic peripheral angiopathy without gangrene: Secondary | ICD-10-CM | POA: Diagnosis not present

## 2021-12-14 DIAGNOSIS — R059 Cough, unspecified: Secondary | ICD-10-CM | POA: Diagnosis not present

## 2021-12-14 DIAGNOSIS — D509 Iron deficiency anemia, unspecified: Secondary | ICD-10-CM | POA: Diagnosis present

## 2021-12-14 DIAGNOSIS — Z7982 Long term (current) use of aspirin: Secondary | ICD-10-CM | POA: Diagnosis not present

## 2021-12-14 DIAGNOSIS — G20 Parkinson's disease: Secondary | ICD-10-CM | POA: Diagnosis not present

## 2021-12-14 DIAGNOSIS — G20A1 Parkinson's disease without dyskinesia, without mention of fluctuations: Secondary | ICD-10-CM | POA: Diagnosis present

## 2021-12-14 DIAGNOSIS — R109 Unspecified abdominal pain: Secondary | ICD-10-CM

## 2021-12-14 LAB — CBC WITH DIFFERENTIAL/PLATELET
Abs Immature Granulocytes: 0.02 10*3/uL (ref 0.00–0.07)
Basophils Absolute: 0 10*3/uL (ref 0.0–0.1)
Basophils Relative: 0 %
Eosinophils Absolute: 0 10*3/uL (ref 0.0–0.5)
Eosinophils Relative: 0 %
HCT: 34.1 % — ABNORMAL LOW (ref 39.0–52.0)
Hemoglobin: 11.6 g/dL — ABNORMAL LOW (ref 13.0–17.0)
Immature Granulocytes: 0 %
Lymphocytes Relative: 18 %
Lymphs Abs: 1.3 10*3/uL (ref 0.7–4.0)
MCH: 30.3 pg (ref 26.0–34.0)
MCHC: 34 g/dL (ref 30.0–36.0)
MCV: 89 fL (ref 80.0–100.0)
Monocytes Absolute: 0.7 10*3/uL (ref 0.1–1.0)
Monocytes Relative: 9 %
Neutro Abs: 5.2 10*3/uL (ref 1.7–7.7)
Neutrophils Relative %: 73 %
Platelets: 147 10*3/uL — ABNORMAL LOW (ref 150–400)
RBC: 3.83 MIL/uL — ABNORMAL LOW (ref 4.22–5.81)
RDW: 14.6 % (ref 11.5–15.5)
WBC: 7.2 10*3/uL (ref 4.0–10.5)
nRBC: 0 % (ref 0.0–0.2)

## 2021-12-14 LAB — COMPREHENSIVE METABOLIC PANEL
ALT: 17 U/L (ref 0–44)
AST: 29 U/L (ref 15–41)
Albumin: 3.2 g/dL — ABNORMAL LOW (ref 3.5–5.0)
Alkaline Phosphatase: 70 U/L (ref 38–126)
Anion gap: 11 (ref 5–15)
BUN: 19 mg/dL (ref 8–23)
CO2: 23 mmol/L (ref 22–32)
Calcium: 8.4 mg/dL — ABNORMAL LOW (ref 8.9–10.3)
Chloride: 95 mmol/L — ABNORMAL LOW (ref 98–111)
Creatinine, Ser: 1.21 mg/dL (ref 0.61–1.24)
GFR, Estimated: 59 mL/min — ABNORMAL LOW (ref >60)
Glucose, Bld: 224 mg/dL — ABNORMAL HIGH (ref 70–99)
Potassium: 3 mmol/L — ABNORMAL LOW (ref 3.5–5.1)
Sodium: 129 mmol/L — ABNORMAL LOW (ref 135–145)
Total Bilirubin: 1 mg/dL (ref 0.3–1.2)
Total Protein: 6 g/dL — ABNORMAL LOW (ref 6.5–8.1)

## 2021-12-14 LAB — TROPONIN I (HIGH SENSITIVITY)
Troponin I (High Sensitivity): 27 ng/L — ABNORMAL HIGH (ref <18)
Troponin I (High Sensitivity): 23 ng/L — ABNORMAL HIGH (ref <18)

## 2021-12-14 LAB — PROTIME-INR
INR: 1.1 (ref 0.8–1.2)
Prothrombin Time: 14 seconds (ref 11.4–15.2)

## 2021-12-14 MED ORDER — AMLODIPINE BESYLATE 5 MG PO TABS
2.5000 mg | ORAL_TABLET | Freq: Every day | ORAL | Status: DC
  Administered 2021-12-15 – 2021-12-20 (×4): 2.5 mg via ORAL
  Filled 2021-12-14 (×6): qty 1

## 2021-12-14 MED ORDER — HYDROCODONE-ACETAMINOPHEN 5-325 MG PO TABS: 1.0000 | ORAL_TABLET | Freq: Four times a day (QID) | ORAL | Status: DC | PRN

## 2021-12-14 MED ORDER — ASCORBIC ACID 500 MG PO TABS
1000.0000 mg | ORAL_TABLET | Freq: Every day | ORAL | Status: DC
  Administered 2021-12-15 – 2021-12-21 (×7): 1000 mg via ORAL
  Filled 2021-12-14 (×7): qty 2

## 2021-12-14 MED ORDER — INSULIN ASPART PROT & ASPART (70-30 MIX) 100 UNIT/ML ~~LOC~~ SUSP
8.0000 [IU] | Freq: Two times a day (BID) | SUBCUTANEOUS | Status: DC
  Administered 2021-12-15: 8 [IU] via SUBCUTANEOUS
  Filled 2021-12-14: qty 0.08

## 2021-12-14 MED ORDER — LEVOTHYROXINE SODIUM 50 MCG PO TABS
75.0000 ug | ORAL_TABLET | Freq: Every day | ORAL | Status: DC
  Administered 2021-12-16 – 2021-12-21 (×6): 75 ug via ORAL
  Filled 2021-12-14 (×6): qty 1

## 2021-12-14 MED ORDER — ATORVASTATIN CALCIUM 20 MG PO TABS
40.0000 mg | ORAL_TABLET | Freq: Every day | ORAL | Status: DC
  Administered 2021-12-15 – 2021-12-20 (×6): 40 mg via ORAL
  Filled 2021-12-14 (×6): qty 2

## 2021-12-14 MED ORDER — ACETAMINOPHEN 325 MG PO TABS: 650.0000 mg | ORAL_TABLET | ORAL | Status: DC | PRN

## 2021-12-14 MED ORDER — MORPHINE SULFATE (PF) 2 MG/ML IV SOLN
2.0000 mg | INTRAVENOUS | Status: DC | PRN
Start: 2021-12-14 — End: 2021-12-14
  Administered 2021-12-14: 2 mg via INTRAVENOUS
  Filled 2021-12-14: qty 1

## 2021-12-14 MED ORDER — MORPHINE SULFATE (PF) 2 MG/ML IV SOLN
0.5000 mg | INTRAVENOUS | Status: DC | PRN
  Administered 2021-12-15: 0.5 mg via INTRAVENOUS
  Filled 2021-12-14: qty 1

## 2021-12-14 MED ORDER — RIVASTIGMINE TARTRATE 1.5 MG PO CAPS
1.5000 mg | ORAL_CAPSULE | Freq: Two times a day (BID) | ORAL | Status: DC
  Administered 2021-12-15 – 2021-12-21 (×13): 1.5 mg via ORAL
  Filled 2021-12-14 (×13): qty 1

## 2021-12-14 MED ORDER — MAGNESIUM HYDROXIDE 400 MG/5ML PO SUSP
30.0000 mL | Freq: Every day | ORAL | Status: DC | PRN
  Administered 2021-12-16: 30 mL via ORAL
  Filled 2021-12-14: qty 30

## 2021-12-14 MED ORDER — CARBIDOPA-LEVODOPA 25-100 MG PO TABS
2.0000 | ORAL_TABLET | Freq: Three times a day (TID) | ORAL | Status: DC
  Administered 2021-12-15 – 2021-12-21 (×26): 2 via ORAL
  Filled 2021-12-14 (×26): qty 2

## 2021-12-14 MED ORDER — ONDANSETRON HCL 4 MG/2ML IJ SOLN: 4.0000 mg | INTRAMUSCULAR | Status: DC | PRN

## 2021-12-14 MED ORDER — SODIUM CHLORIDE 0.9 % IV SOLN: INTRAVENOUS | Status: DC

## 2021-12-14 MED ORDER — TRAZODONE HCL 50 MG PO TABS: 25.0000 mg | ORAL_TABLET | Freq: Every evening | ORAL | Status: DC | PRN

## 2021-12-14 MED ORDER — LISINOPRIL 10 MG PO TABS: 40.0000 mg | ORAL_TABLET | Freq: Every day | ORAL | Status: DC

## 2021-12-14 MED ORDER — ATENOLOL 25 MG PO TABS
25.0000 mg | ORAL_TABLET | Freq: Every day | ORAL | Status: DC
  Administered 2021-12-15 – 2021-12-21 (×5): 25 mg via ORAL
  Filled 2021-12-14 (×7): qty 1

## 2021-12-14 MED ORDER — COLCHICINE 0.6 MG PO TABS: 0.6000 mg | ORAL_TABLET | Freq: Every day | ORAL | Status: DC | PRN

## 2021-12-14 MED ORDER — OXYCODONE-ACETAMINOPHEN 5-325 MG PO TABS: 1.0000 | ORAL_TABLET | ORAL | Status: DC | PRN

## 2021-12-14 MED ORDER — SODIUM CHLORIDE 0.9 % IV BOLUS
1000.0000 mL | Freq: Once | INTRAVENOUS | Status: AC
  Administered 2021-12-14: 1000 mL via INTRAVENOUS

## 2021-12-14 MED ORDER — HYDRALAZINE HCL 50 MG PO TABS
100.0000 mg | ORAL_TABLET | Freq: Three times a day (TID) | ORAL | Status: DC
  Administered 2021-12-15 – 2021-12-21 (×15): 100 mg via ORAL
  Filled 2021-12-14 (×18): qty 2

## 2021-12-14 MED ORDER — DIVALPROEX SODIUM ER 500 MG PO TB24
500.0000 mg | ORAL_TABLET | Freq: Two times a day (BID) | ORAL | Status: DC
  Administered 2021-12-15 – 2021-12-21 (×13): 500 mg via ORAL
  Filled 2021-12-14 (×3): qty 1
  Filled 2021-12-14: qty 2
  Filled 2021-12-14 (×9): qty 1

## 2021-12-14 NOTE — Assessment & Plan Note (Signed)
K replaced on admission. Monitor and replace PRN

## 2021-12-14 NOTE — Assessment & Plan Note (Addendum)
Ccontinue Sinemet

## 2021-12-14 NOTE — Assessment & Plan Note (Signed)
Continue Synthroid °

## 2021-12-14 NOTE — Assessment & Plan Note (Addendum)
With hyperglycemia --Increased 70/30 insulin to 10 units BID --Adjust insulin as needed --Close PCP follow up

## 2021-12-14 NOTE — ED Provider Notes (Signed)
[facility] Provider Note   Event Date/Time   First MD Initiated Contact with Patient 12/14/21 1920     (approximate) History  Fall  HPI Gilbert KROHN Sr. is a 81 y.o. male with stated past medical history of Parkinson's disease who presents via EMS after mechanical fall trying to get out of his car falling onto the right side and now complaining of significant left hip pain.  Patient was unable to ambulate after this fall due to this hip pain.  Patient denies any sensory changes in this right lower extremity however does complain of 10/10 pain with any attempted movement at the right hip joint ROS: Patient currently denies any vision changes, tinnitus, difficulty speaking, facial droop, sore throat, chest pain, shortness of breath, abdominal pain, nausea/vomiting/diarrhea, dysuria, or weakness/numbness/paresthesias in any extremity   Physical Exam  Triage Vital Signs: ED Triage Vitals  Enc Vitals Group     BP      Pulse      Resp      Temp      Temp src      SpO2      Weight      Height      Head Circumference      Peak Flow      Pain Score      Pain Loc      Pain Edu?      Excl. in [place]?    Most recent vital signs: Vitals:   12/14/21 1926 12/14/21 1930  BP: (!) 188/77 (!) 176/95  Pulse: 62 (!) 59  Resp: 14 14  Temp: 98.3 F (36.8 C)   SpO2: 95% 95%   General: Awake, oriented x4. CV:  Good peripheral perfusion.  Resp:  Normal effort.  Abd:  No distention.  Other:  Right lower extremity tenderness to palpation over the lateral right hip as well as significant pain with any attempted movement at the right hip joint.  Distally neurovascularly intact ED Results / Procedures / Treatments  Labs (all labs ordered are listed, but only abnormal results are displayed) Labs Reviewed  COMPREHENSIVE METABOLIC PANEL - Abnormal; Notable for the following components:      Result Value   Sodium 129 (*)    Potassium 3.0 (*)    Chloride 95 (*)     Glucose, Bld 224 (*)    Calcium 8.4 (*)    Total Protein 6.0 (*)    Albumin 3.2 (*)    GFR, Estimated 59 (*)    All other components within normal limits  CBC WITH DIFFERENTIAL/PLATELET - Abnormal; Notable for the following components:   RBC 3.83 (*)    Hemoglobin 11.6 (*)    HCT 34.1 (*)    Platelets 147 (*)    All other components within normal limits  TROPONIN I (HIGH SENSITIVITY) - Abnormal; Notable for the following components:   Troponin I (High Sensitivity) 27 (*)    All other components within normal limits  PROTIME-INR   EKG ED ECG REPORT I, Naaman Plummer, the attending physician, personally viewed and interpreted this ECG. Date: 12/14/2021 EKG Time: 1921 Rate: 61 Rhythm: normal sinus rhythm QRS Axis: normal Intervals: Right bundle branch block, left anterior fascicular block ST/T Wave abnormalities: normal Narrative Interpretation: Normal sinus rhythm with right bundle branch block and left anterior fascicular block.  No evidence of acute ischemia RADIOLOGY ED MD interpretation: X-ray of the right hip and pelvis interpreted by me and shows an intratrochanteric  fracture of the right femoral neck -Agree with radiology assessment Official radiology report(s): DG Hip Unilat With Pelvis 2-3 Views Right  Result Date: 12/14/2021 CLINICAL DATA:  Fall and right hip pain. EXAM: DG HIP (WITH OR WITHOUT PELVIS) 2-3V RIGHT COMPARISON:  None Available. FINDINGS: Minimally displaced and comminuted appearing intertrochanteric fracture of the right femoral neck. No other acute fracture. There is no dislocation. The bones are osteopenic. Moderate arthritic changes of the hips. The soft tissues are unremarkable. IMPRESSION: Intertrochanteric fracture of the right femoral neck. Electronically Signed   By: Anner Crete M.D.   On: 12/14/2021 20:00   PROCEDURES: Critical Care performed: No .1-3 Lead EKG Interpretation  Performed by: Naaman Plummer, MD Authorized by: Naaman Plummer,  MD     Interpretation: abnormal     ECG rate:  62   ECG rate assessment: normal     Rhythm: sinus rhythm     Ectopy: none     Conduction: normal   Comments:     Left anterior fascicular block, right bundle branch block  MEDICATIONS ORDERED IN ED: Medications  sodium chloride 0.9 % bolus 1,000 mL (1,000 mLs Intravenous New Bag/Given 12/14/21 2035)   IMPRESSION / MDM / ASSESSMENT AND PLAN / ED COURSE  I reviewed the triage vital signs and the nursing notes.                             The patient is on the cardiac monitor to evaluate for evidence of arrhythmia and/or significant heart rate changes. Patient's presentation is most consistent with acute presentation with potential threat to life or bodily function. Patient is a 81 year old male with the above-stated past medical history that presents for right hip pain after mechanical fall from standing Workup: XR hip Findings: Right intertrochanteric fracture without dislocation Consult: Orthopedic Surgery, hospitalist  Patient does not currently demonstrate complications of fracture such as compartment syndrome, arterial or nerve injury.  Interventions: analgesia Disposition: Admit   FINAL CLINICAL IMPRESSION(S) / ED DIAGNOSES   Final diagnoses:  Closed displaced fracture of right femoral neck ([place])  Fall, initial encounter  Right hip pain   Rx / DC Orders   ED Discharge Orders     None      Note:  This document was prepared using Dragon voice recognition software and may include unintentional dictation errors.   Naaman Plummer, MD 12/14/21 2039

## 2021-12-14 NOTE — ED Notes (Signed)
Pt to radiology.

## 2021-12-14 NOTE — H&P (Signed)
[place]   PATIENT NAME: Gilbert [name]    MR#:  [id number]  DATE OF BIRTH:  [birth year]  DATE OF ADMISSION:  12/14/2021  PRIMARY CARE PHYSICIAN: Gilbert Aus, MD   Patient is coming from: Home  REQUESTING/REFERRING PHYSICIAN: [name], MD  CHIEF COMPLAINT:   Chief Complaint  Patient presents with   Fall    HISTORY OF PRESENT ILLNESS:  Gilbert WINGERTER Sr. is a 81 y.o. Caucasian male with medical history significant for type 2 diabetes mellitus, essential hypertension, Parkinson's disease and stroke, who presented to the emergency room with acute onset of accidental mechanical fall with subsequent right hip pain.  The patient was getting out of a car and lost his balance with subsequent fall on his right side.  He denied any headache or dizziness or blurred vision.  No chest pain or dyspnea or palpitations.  No paresthesias or focal muscle weakness.  No recent cough or wheezing or dyspnea.  No distal, oliguria or hematuria or flank pain.  No head injuries.  ED Course: When he came to the ER, BP was 188/77 with otherwise normal vital signs.  Labs revealed hyponatremia 129 hypokalemia of 3 with hypochloremia 95 and blood glucose of 224 with calcium 8.4 and albumin 3.22 obtained 6.  CBC showed mild anemia close to baseline. EKG as reviewed by me : EKG showed junctional rhythm with a rate of 61 with right bundle branch block and left intrafascicular block as well as LVH. Imaging: 2 view chest ray showed no acute cardiopulmonary disease.  Pelvis and right hip x-ray showed intertrochanteric fracture of the right femoral neck.  The patient was given 1 L bolus of IV normal saline and ordered IV morphine.  He will be admitted to a surgical telemetry bed for further evaluation and management. PAST MEDICAL HISTORY:   Past Medical History:  Diagnosis Date   Choledocholithiasis    Chronic cholecystitis    Diabetes mellitus without complication ([place])    Hypertension     Parkinson disease ([place])    Stroke ([place])     PAST SURGICAL HISTORY:   Past Surgical History:  Procedure Laterality Date   ELBOW SURGERY     ENDOSCOPIC RETROGRADE CHOLANGIOPANCREATOGRAPHY (ERCP) WITH PROPOFOL N/A 06/12/2019   Procedure: ENDOSCOPIC RETROGRADE CHOLANGIOPANCREATOGRAPHY (ERCP) WITH PROPOFOL;  Surgeon: Lucilla Lame, MD;  Location: [facility] ENDOSCOPY;  Service: Endoscopy;  Laterality: N/A;   ERCP N/A 07/10/2019   Procedure: ENDOSCOPIC RETROGRADE CHOLANGIOPANCREATOGRAPHY (ERCP) STENT REMOVAL;  Surgeon: Lucilla Lame, MD;  Location: [facility] ENDOSCOPY;  Service: Endoscopy;  Laterality: N/A;   subtotal cholecystectomy  06/13/2019    SOCIAL HISTORY:   Social History   Tobacco Use   Smoking status: Never   Smokeless tobacco: Never  Substance Use Topics   Alcohol use: No    FAMILY HISTORY:   Family History  Problem Relation Age of Onset   Brain cancer Mother    Prostate cancer Neg Hx    Bladder Cancer Neg Hx    Kidney cancer Neg Hx     DRUG ALLERGIES:  No Known Allergies  REVIEW OF SYSTEMS:   ROS As per history of present illness. All pertinent systems were reviewed above. Constitutional, HEENT, cardiovascular, respiratory, GI, GU, musculoskeletal, neuro, psychiatric, endocrine, integumentary and hematologic systems were reviewed and are otherwise negative/unremarkable except for positive findings mentioned above in the HPI.   MEDICATIONS AT HOME:   Prior to Admission medications   Medication Sig Start Date End Date Taking? Authorizing  Provider  acetaminophen (TYLENOL) 325 MG tablet Take 2 tablets (650 mg total) by mouth every 6 (six) hours as needed for mild pain or fever. 04/22/21   Richarda Osmond, MD  amLODipine (NORVASC) 2.5 MG tablet Take 1 tablet (2.5 mg total) by mouth daily. 04/29/21   Loletha Grayer, MD  aspirin EC 81 MG EC tablet Take 1 tablet (81 mg total) by mouth daily. 05/10/16   Gladstone Lighter, MD  atenolol (TENORMIN) 50 MG tablet Take 50 mg by  mouth daily.    [provider]  atorvastatin (LIPITOR) 80 MG tablet Take 40 mg by mouth at bedtime.     [provider]  carbidopa-levodopa (SINEMET IR) 25-100 MG tablet Take 1 tablet by mouth 3 (three) times daily. 12/07/18   [provider]  colchicine 0.6 MG tablet Take 1 tablet by mouth daily as needed. 07/04/20   [provider]  divalproex (DEPAKOTE ER) 500 MG 24 hr tablet Take 500 mg by mouth 2 (two) times daily.     [provider]  hydrALAZINE (APRESOLINE) 100 MG tablet Take 1 tablet (100 mg total) by mouth 3 (three) times daily. 04/22/21 05/22/21  Richarda Osmond, MD  HYDROcodone-acetaminophen (NORCO/VICODIN) 5-325 MG tablet Take 1 tablet by mouth every 8 (eight) hours as needed for severe pain. 04/29/21   Loletha Grayer, MD  insulin aspart protamine- aspart (NOVOLOG MIX 70/30) (70-30) 100 UNIT/ML injection Inject 8 u under the skin every morning and inject 8u with dinner 04/29/21   Loletha Grayer, MD  levothyroxine (SYNTHROID, LEVOTHROID) 75 MCG tablet Take 75 mcg by mouth daily before breakfast.    [provider]  lisinopril (PRINIVIL,ZESTRIL) 40 MG tablet Take 1 tablet (40 mg total) by mouth at bedtime. 05/10/16   Gladstone Lighter, MD  rivastigmine (EXELON) 1.5 MG capsule Take 1.5 mg by mouth 2 (two) times daily. 03/31/21   [provider]  vitamin C (ASCORBIC ACID) 500 MG tablet Take 1,000 mg by mouth daily.    [provider]      VITAL SIGNS:  Blood pressure (!) 176/95, pulse (!) 59, temperature 98.3 F (36.8 C), temperature source Oral, resp. rate 14, height 5\' 10"  (1.778 m), weight 75.8 kg, SpO2 95 %.  PHYSICAL EXAMINATION:  Physical Exam  GENERAL:  81 y.o.-year-old male patient lying in the bed with no acute distress.  EYES: Pupils equal, round, reactive to light and accommodation. No scleral icterus. Extraocular muscles intact.  HEENT: Head atraumatic, normocephalic. Oropharynx and nasopharynx  clear.  NECK:  Supple, no jugular venous distention. No thyroid enlargement, no tenderness.  LUNGS: Normal breath sounds bilaterally, no wheezing, rales,rhonchi or crepitation. No use of accessory muscles of respiration.  CARDIOVASCULAR: Regular rate and rhythm, S1, S2 normal. No murmurs, rubs, or gallops.  ABDOMEN: Soft, nondistended, nontender. Bowel sounds present. No organomegaly or mass.  EXTREMITIES: No pedal edema, cyanosis, or clubbing. Musculoskeletal: Right hip tenderness NEUROLOGIC: Cranial nerves II through XII are intact. Muscle strength 5/5 in all extremities. Sensation intact. Gait not checked.  PSYCHIATRIC: The patient is alert and oriented x 3.  Normal affect and good eye contact. SKIN: No obvious rash, lesion, or ulcer.   LABORATORY PANEL:   CBC Recent Labs  Lab 12/14/21 1936  WBC 7.2  HGB 11.6*  HCT 34.1*  PLT 147*   ------------------------------------------------------------------------------------------------------------------  Chemistries  Recent Labs  Lab 12/14/21 1936  NA 129*  K 3.0*  CL 95*  CO2 23  GLUCOSE 224*  BUN 19  CREATININE  1.21  CALCIUM 8.4*  AST 29  ALT 17  ALKPHOS 70  BILITOT 1.0   ------------------------------------------------------------------------------------------------------------------  Cardiac Enzymes No results for input(s): "TROPONINI" in the last 168 hours. ------------------------------------------------------------------------------------------------------------------  RADIOLOGY:  DG Hip Unilat With Pelvis 2-3 Views Right  Result Date: 12/14/2021 CLINICAL DATA:  Fall and right hip pain. EXAM: DG HIP (WITH OR WITHOUT PELVIS) 2-3V RIGHT COMPARISON:  None Available. FINDINGS: Minimally displaced and comminuted appearing intertrochanteric fracture of the right femoral neck. No other acute fracture. There is no dislocation. The bones are osteopenic. Moderate arthritic changes of the hips. The soft tissues are  unremarkable. IMPRESSION: Intertrochanteric fracture of the right femoral neck. Electronically Signed   By: Anner Crete M.D.   On: 12/14/2021 20:00      IMPRESSION AND PLAN:  Assessment and Plan: * Closed right hip fracture [facility]) - The patient will be admitted to a surgical telemetry bed. - Pain management will be provided. - Orthopedic consult to be obtained. - Dr. Harlow Mares was notified about the patient. - She will be kept n.p.o. after midnight. - Her aspirin will be held off. - She has history of CVA, and diabetes mellitus on insulin with history of CHF, coronary artery disease or renal failure with creatinine more than 2.  He  is considered above average risk for her age for perioperative cardiovascular events per the revised cardiac risk index.  She has no current pulmonary issues. - We will continue his beta-blocker and statin therapy that should provide perioperative cardiovascular risk reduction.    Hypertensive urgency - We will continue his antihypertensive therapy and place him on as needed IV hydralazine together with adequate pain management.  Hypokalemia - Potassium will be replaced and level will be checked.  Dyslipidemia - We will continue statin therapy.  Parkinson disease ([place]) We will continue Sinemet  Hypothyroidism - We will continue his Synthroid.  Type II diabetes mellitus with renal manifestations ([place]) - He may be having a baseline stage II chronic kidney disease with his diabetes mellitus with current mild AKI. - We will place him on hydration with IV normal saline. - He will be placed on supplement coverage with NovoLog and will follow his blood glucose measures. -We will continue his basal coverage.       DVT prophylaxis: SCDs.  Medical prophylaxis is postponed to postoperative. Advanced Care Planning:  Code Status: full code.  This was discussed with the patient and his family. Family Communication:  The plan of care was discussed in details  with the patient (and family). I answered all questions. The patient agreed to proceed with the above mentioned plan. Further management will depend upon hospital course. Disposition Plan: Back to previous home environment Consults called: Orthopedic consult All the records are reviewed and case discussed with ED provider.  Status is: Inpatient   At the time of the admission, it appears that the appropriate admission status for this patient is inpatient.  This is judged to be reasonable and necessary in order to provide the required intensity of service to ensure the patient's safety given the presenting symptoms, physical exam findings and initial radiographic and laboratory data in the context of comorbid conditions.  The patient requires inpatient status due to high intensity of service, high risk of further deterioration and high frequency of surveillance required.  I certify that at the time of admission, it is my clinical judgment that the patient will require inpatient hospital care extending more than 2 midnights.  Dispo: The patient is from: Home              Anticipated d/c is to: Home              Patient currently is not medically stable to d/c.              Difficult to place patient: No  Christel Mormon M.D on 12/14/2021 at 9:40 PM  [facility]   From 7 PM-7 AM, contact night-coverage [email or web address]  CC: Primary care physician; Gilbert Aus, MD

## 2021-12-14 NOTE — Assessment & Plan Note (Signed)
Continue home meds. PRN IV hydralazine  Pain control

## 2021-12-14 NOTE — ED Triage Notes (Signed)
Pt to ED via [facility] from home.  Pt was getting out of car and tripped and fell.  Pt c/o pain in R hip and was not able to walk after falling.  Pt denies blood thinner, takes an ASA a day.    EMS VS HR=56 96% RA 128/77 Cbg=224  20g R hand, received fentanyl 74mcg

## 2021-12-14 NOTE — Assessment & Plan Note (Signed)
Continue statin. 

## 2021-12-14 NOTE — Assessment & Plan Note (Addendum)
Orthopedic surgery following. Underwent ORIF with intramedullary nailing on 8/22. PT/OT evaluations. Pain control, bowel regimen. DVT prophylaxis - SCD's, 81 mg ASA  Follow Ortho's recommendations.

## 2021-12-15 ENCOUNTER — Inpatient Hospital Stay: Payer: HMO | Admitting: Anesthesiology

## 2021-12-15 ENCOUNTER — Other Ambulatory Visit: Payer: Self-pay

## 2021-12-15 ENCOUNTER — Encounter: Payer: Self-pay | Admitting: Family Medicine

## 2021-12-15 ENCOUNTER — Encounter
Admission: EM | Disposition: A | Discharge status: Skilled Nursing Facility | Payer: Self-pay | Source: Home / Self Care | Attending: Internal Medicine

## 2021-12-15 ENCOUNTER — Inpatient Hospital Stay: Payer: HMO

## 2021-12-15 DIAGNOSIS — E876 Hypokalemia: Secondary | ICD-10-CM | POA: Diagnosis not present

## 2021-12-15 DIAGNOSIS — E871 Hypo-osmolality and hyponatremia: Secondary | ICD-10-CM | POA: Diagnosis present

## 2021-12-15 DIAGNOSIS — S72001A Fracture of unspecified part of neck of right femur, initial encounter for closed fracture: Secondary | ICD-10-CM | POA: Diagnosis not present

## 2021-12-15 HISTORY — PX: INTRAMEDULLARY (IM) NAIL INTERTROCHANTERIC: SHX5875

## 2021-12-15 LAB — BASIC METABOLIC PANEL
Anion gap: 7 (ref 5–15)
BUN: 17 mg/dL (ref 8–23)
CO2: 24 mmol/L (ref 22–32)
Calcium: 7.8 mg/dL — ABNORMAL LOW (ref 8.9–10.3)
Chloride: 99 mmol/L (ref 98–111)
Creatinine, Ser: 1.03 mg/dL (ref 0.61–1.24)
GFR, Estimated: >60 mL/min (ref >60)
Glucose, Bld: 187 mg/dL — ABNORMAL HIGH (ref 70–99)
Potassium: 3 mmol/L — ABNORMAL LOW (ref 3.5–5.1)
Sodium: 130 mmol/L — ABNORMAL LOW (ref 135–145)

## 2021-12-15 LAB — CBC
HCT: 30.1 % — ABNORMAL LOW (ref 39.0–52.0)
Hemoglobin: 10.2 g/dL — ABNORMAL LOW (ref 13.0–17.0)
MCH: 30.2 pg (ref 26.0–34.0)
MCHC: 33.9 g/dL (ref 30.0–36.0)
MCV: 89.1 fL (ref 80.0–100.0)
Platelets: 113 10*3/uL — ABNORMAL LOW (ref 150–400)
RBC: 3.38 MIL/uL — ABNORMAL LOW (ref 4.22–5.81)
RDW: 14.2 % (ref 11.5–15.5)
WBC: 7.2 10*3/uL (ref 4.0–10.5)
nRBC: 0 % (ref 0.0–0.2)

## 2021-12-15 LAB — GLUCOSE, CAPILLARY
Glucose-Capillary: 153 mg/dL — ABNORMAL HIGH (ref 70–99)
Glucose-Capillary: 155 mg/dL — ABNORMAL HIGH (ref 70–99)
Glucose-Capillary: 169 mg/dL — ABNORMAL HIGH (ref 70–99)
Glucose-Capillary: 201 mg/dL — ABNORMAL HIGH (ref 70–99)
Glucose-Capillary: 206 mg/dL — ABNORMAL HIGH (ref 70–99)

## 2021-12-15 LAB — MRSA NEXT GEN BY PCR, NASAL: MRSA by PCR Next Gen: NOT DETECTED

## 2021-12-15 SURGERY — FIXATION, FRACTURE, INTERTROCHANTERIC, WITH INTRAMEDULLARY ROD
Anesthesia: Choice | Laterality: Right

## 2021-12-15 SURGERY — FIXATION, FRACTURE, INTERTROCHANTERIC, WITH INTRAMEDULLARY ROD
Anesthesia: General | Site: Hip | Laterality: Right

## 2021-12-15 MED ORDER — GLYCOPYRROLATE 0.2 MG/ML IJ SOLN
INTRAMUSCULAR | Status: AC
  Filled 2021-12-15: qty 1

## 2021-12-15 MED ORDER — GLYCOPYRROLATE 0.2 MG/ML IJ SOLN
INTRAMUSCULAR | Status: DC | PRN
  Administered 2021-12-15: .2 mg via INTRAVENOUS

## 2021-12-15 MED ORDER — ONDANSETRON HCL 4 MG/2ML IJ SOLN
INTRAMUSCULAR | Status: DC | PRN
  Administered 2021-12-15: 4 mg via INTRAVENOUS

## 2021-12-15 MED ORDER — DOCUSATE SODIUM 100 MG PO CAPS
100.0000 mg | ORAL_CAPSULE | Freq: Two times a day (BID) | ORAL | Status: DC
  Administered 2021-12-15 – 2021-12-21 (×12): 100 mg via ORAL
  Filled 2021-12-15 (×12): qty 1

## 2021-12-15 MED ORDER — KETAMINE HCL 50 MG/5ML IJ SOSY
PREFILLED_SYRINGE | INTRAMUSCULAR | Status: AC
  Filled 2021-12-15: qty 5

## 2021-12-15 MED ORDER — KETAMINE HCL 10 MG/ML IJ SOLN
INTRAMUSCULAR | Status: DC | PRN
  Administered 2021-12-15 (×3): 10 mg via INTRAVENOUS

## 2021-12-15 MED ORDER — PROPOFOL 10 MG/ML IV BOLUS
INTRAVENOUS | Status: DC | PRN
  Administered 2021-12-15: 90 mg via INTRAVENOUS

## 2021-12-15 MED ORDER — TRAMADOL HCL 50 MG PO TABS
50.0000 mg | ORAL_TABLET | Freq: Four times a day (QID) | ORAL | Status: DC
  Administered 2021-12-15 – 2021-12-19 (×10): 50 mg via ORAL
  Filled 2021-12-15 (×11): qty 1

## 2021-12-15 MED ORDER — CEFAZOLIN SODIUM-DEXTROSE 2-4 GM/100ML-% IV SOLN
2.0000 g | Freq: Four times a day (QID) | INTRAVENOUS | Status: AC
  Administered 2021-12-15 – 2021-12-16 (×2): 2 g via INTRAVENOUS
  Filled 2021-12-15 (×2): qty 100

## 2021-12-15 MED ORDER — DEXAMETHASONE SODIUM PHOSPHATE 10 MG/ML IJ SOLN
INTRAMUSCULAR | Status: DC | PRN
  Administered 2021-12-15: 5 mg via INTRAVENOUS

## 2021-12-15 MED ORDER — FENTANYL CITRATE (PF) 100 MCG/2ML IJ SOLN: 25.0000 ug | INTRAMUSCULAR | Status: DC | PRN

## 2021-12-15 MED ORDER — HYDROCODONE-ACETAMINOPHEN 5-325 MG PO TABS: 1.0000 | ORAL_TABLET | ORAL | Status: DC | PRN

## 2021-12-15 MED ORDER — POTASSIUM CHLORIDE CRYS ER 20 MEQ PO TBCR: 40.0000 meq | EXTENDED_RELEASE_TABLET | ORAL | Status: AC

## 2021-12-15 MED ORDER — ACETAMINOPHEN 500 MG PO TABS
500.0000 mg | ORAL_TABLET | Freq: Four times a day (QID) | ORAL | Status: DC
  Administered 2021-12-15 – 2021-12-16 (×2): 500 mg via ORAL
  Filled 2021-12-15 (×2): qty 1

## 2021-12-15 MED ORDER — METHOCARBAMOL 1000 MG/10ML IJ SOLN
500.0000 mg | Freq: Four times a day (QID) | INTRAVENOUS | Status: DC | PRN
  Filled 2021-12-15: qty 5

## 2021-12-15 MED ORDER — BISACODYL 10 MG RE SUPP
10.0000 mg | Freq: Every day | RECTAL | Status: DC | PRN
  Administered 2021-12-18: 10 mg via RECTAL

## 2021-12-15 MED ORDER — SENNA 8.6 MG PO TABS
1.0000 | ORAL_TABLET | Freq: Two times a day (BID) | ORAL | Status: DC
  Administered 2021-12-15 – 2021-12-21 (×12): 8.6 mg via ORAL
  Filled 2021-12-15 (×12): qty 1

## 2021-12-15 MED ORDER — CEFAZOLIN SODIUM-DEXTROSE 2-4 GM/100ML-% IV SOLN
INTRAVENOUS | Status: AC
  Filled 2021-12-15: qty 100

## 2021-12-15 MED ORDER — FENTANYL CITRATE (PF) 100 MCG/2ML IJ SOLN
INTRAMUSCULAR | Status: AC
  Administered 2021-12-15: 50 ug via INTRAVENOUS
  Filled 2021-12-15: qty 2

## 2021-12-15 MED ORDER — ACETAMINOPHEN 10 MG/ML IV SOLN
INTRAVENOUS | Status: DC | PRN
  Administered 2021-12-15: 1000 mg via INTRAVENOUS

## 2021-12-15 MED ORDER — CHLORHEXIDINE GLUCONATE 0.12 % MT SOLN: 15.0000 mL | Freq: Once | OROMUCOSAL | Status: DC

## 2021-12-15 MED ORDER — ACETAMINOPHEN 10 MG/ML IV SOLN: 1000.0000 mg | Freq: Once | INTRAVENOUS | Status: DC | PRN

## 2021-12-15 MED ORDER — PHENOL 1.4 % MT LIQD: 1.0000 | OROMUCOSAL | Status: DC | PRN

## 2021-12-15 MED ORDER — PHENYLEPHRINE HCL (PRESSORS) 10 MG/ML IV SOLN
INTRAVENOUS | Status: DC | PRN
  Administered 2021-12-15 (×2): 80 ug via INTRAVENOUS

## 2021-12-15 MED ORDER — ROCURONIUM BROMIDE 100 MG/10ML IV SOLN
INTRAVENOUS | Status: DC | PRN
  Administered 2021-12-15: 60 mg via INTRAVENOUS

## 2021-12-15 MED ORDER — MENTHOL 3 MG MT LOZG: 1.0000 | LOZENGE | OROMUCOSAL | Status: DC | PRN

## 2021-12-15 MED ORDER — ACETAMINOPHEN 325 MG PO TABS: 325.0000 mg | ORAL_TABLET | Freq: Four times a day (QID) | ORAL | Status: DC | PRN

## 2021-12-15 MED ORDER — OXYCODONE HCL 5 MG/5ML PO SOLN: 5.0000 mg | Freq: Once | ORAL | Status: AC | PRN

## 2021-12-15 MED ORDER — ONDANSETRON HCL 4 MG/2ML IJ SOLN
INTRAMUSCULAR | Status: AC
  Filled 2021-12-15: qty 2

## 2021-12-15 MED ORDER — ONDANSETRON HCL 4 MG PO TABS: 4.0000 mg | ORAL_TABLET | Freq: Four times a day (QID) | ORAL | Status: DC | PRN

## 2021-12-15 MED ORDER — SODIUM CHLORIDE 0.9 % IV SOLN
INTRAVENOUS | Status: DC
Start: 2021-12-15 — End: 2021-12-17

## 2021-12-15 MED ORDER — ALUM & MAG HYDROXIDE-SIMETH 200-200-20 MG/5ML PO SUSP: 30.0000 mL | ORAL | Status: DC | PRN

## 2021-12-15 MED ORDER — FENTANYL CITRATE (PF) 100 MCG/2ML IJ SOLN
INTRAMUSCULAR | Status: AC
  Filled 2021-12-15: qty 2

## 2021-12-15 MED ORDER — OXYCODONE HCL 5 MG PO TABS
5.0000 mg | ORAL_TABLET | Freq: Once | ORAL | Status: AC | PRN
  Administered 2021-12-15: 5 mg via ORAL

## 2021-12-15 MED ORDER — CEFAZOLIN SODIUM-DEXTROSE 2-4 GM/100ML-% IV SOLN
2.0000 g | INTRAVENOUS | Status: AC
  Administered 2021-12-15: 2 g via INTRAVENOUS

## 2021-12-15 MED ORDER — FENTANYL CITRATE (PF) 100 MCG/2ML IJ SOLN
INTRAMUSCULAR | Status: DC | PRN
  Administered 2021-12-15 (×2): 50 ug via INTRAVENOUS

## 2021-12-15 MED ORDER — ORAL CARE MOUTH RINSE: 15.0000 mL | Freq: Once | OROMUCOSAL | Status: DC

## 2021-12-15 MED ORDER — POLYETHYLENE GLYCOL 3350 17 G PO PACK: 17.0000 g | PACK | Freq: Every day | ORAL | Status: DC | PRN

## 2021-12-15 MED ORDER — ONDANSETRON HCL 4 MG/2ML IJ SOLN: 4.0000 mg | Freq: Four times a day (QID) | INTRAMUSCULAR | Status: DC | PRN

## 2021-12-15 MED ORDER — ADULT MULTIVITAMIN W/MINERALS CH
1.0000 | ORAL_TABLET | Freq: Every day | ORAL | Status: DC
  Administered 2021-12-15 – 2021-12-21 (×7): 1 via ORAL
  Filled 2021-12-15 (×7): qty 1

## 2021-12-15 MED ORDER — SUGAMMADEX SODIUM 200 MG/2ML IV SOLN
INTRAVENOUS | Status: DC | PRN
  Administered 2021-12-15: 200 mg via INTRAVENOUS

## 2021-12-15 MED ORDER — METHOCARBAMOL 500 MG PO TABS
500.0000 mg | ORAL_TABLET | Freq: Four times a day (QID) | ORAL | Status: DC | PRN
  Administered 2021-12-18 – 2021-12-20 (×2): 500 mg via ORAL
  Filled 2021-12-15 (×2): qty 1

## 2021-12-15 MED ORDER — OXYCODONE HCL 5 MG PO TABS
ORAL_TABLET | ORAL | Status: AC
  Filled 2021-12-15: qty 1

## 2021-12-15 MED ORDER — ENSURE ENLIVE PO LIQD
237.0000 mL | Freq: Two times a day (BID) | ORAL | Status: DC
  Administered 2021-12-15: 237 mL via ORAL

## 2021-12-15 MED ORDER — TRANEXAMIC ACID-NACL 1000-0.7 MG/100ML-% IV SOLN: 1000.0000 mg | INTRAVENOUS | Status: DC

## 2021-12-15 MED ORDER — TRANEXAMIC ACID-NACL 1000-0.7 MG/100ML-% IV SOLN
INTRAVENOUS | Status: AC
  Filled 2021-12-15: qty 100

## 2021-12-15 MED ORDER — LACTATED RINGERS IV SOLN: INTRAVENOUS | Status: DC

## 2021-12-15 MED ORDER — DEXAMETHASONE SODIUM PHOSPHATE 10 MG/ML IJ SOLN
INTRAMUSCULAR | Status: AC
Start: 2021-12-15 — End: ?
  Filled 2021-12-15: qty 1

## 2021-12-15 MED ORDER — PHENYLEPHRINE 80 MCG/ML (10ML) SYRINGE FOR IV PUSH (FOR BLOOD PRESSURE SUPPORT)
PREFILLED_SYRINGE | INTRAVENOUS | Status: AC
  Filled 2021-12-15: qty 10

## 2021-12-15 MED ORDER — ONDANSETRON HCL 4 MG/2ML IJ SOLN: 4.0000 mg | Freq: Once | INTRAMUSCULAR | Status: DC | PRN

## 2021-12-15 MED ORDER — MORPHINE SULFATE (PF) 2 MG/ML IV SOLN: 0.5000 mg | INTRAVENOUS | Status: DC | PRN

## 2021-12-15 MED ORDER — ENOXAPARIN SODIUM 40 MG/0.4ML IJ SOSY
40.0000 mg | PREFILLED_SYRINGE | INTRAMUSCULAR | Status: DC
  Administered 2021-12-16: 40 mg via SUBCUTANEOUS
  Filled 2021-12-15: qty 0.4

## 2021-12-15 MED ORDER — ACETAMINOPHEN 10 MG/ML IV SOLN
INTRAVENOUS | Status: AC
Start: 2021-12-15 — End: ?
  Filled 2021-12-15: qty 100

## 2021-12-15 MED ORDER — LIDOCAINE HCL (CARDIAC) PF 100 MG/5ML IV SOSY
PREFILLED_SYRINGE | INTRAVENOUS | Status: DC | PRN
  Administered 2021-12-15: 100 mg via INTRAVENOUS

## 2021-12-15 SURGICAL SUPPLY — 42 items
BIT DRILL CANN LG 4.3MM (BIT) IMPLANT
BNDG COHESIVE 6X5 TAN ST LF (GAUZE/BANDAGES/DRESSINGS) ×2 IMPLANT
DRAPE 3/4 80X56 (DRAPES) ×2 IMPLANT
DRAPE SURG 17X11 SM STRL (DRAPES) ×2 IMPLANT
DRAPE U-SHAPE 47X51 STRL (DRAPES) ×2 IMPLANT
DRILL BIT CANN LG 4.3MM (BIT) ×1
DRSG OPSITE POSTOP 3X4 (GAUZE/BANDAGES/DRESSINGS) ×2 IMPLANT
DRSG OPSITE POSTOP 4X14 (GAUZE/BANDAGES/DRESSINGS) IMPLANT
DRSG OPSITE POSTOP 4X6 (GAUZE/BANDAGES/DRESSINGS) ×1 IMPLANT
DURAPREP 26ML APPLICATOR (WOUND CARE) ×2 IMPLANT
ELECT REM PT RETURN 9FT ADLT (ELECTROSURGICAL) ×1
ELECTRODE REM PT RTRN 9FT ADLT (ELECTROSURGICAL) ×1 IMPLANT
GLOVE BIOGEL PI IND STRL 9 (GLOVE) ×1 IMPLANT
GLOVE BIOGEL PI INDICATOR 9 (GLOVE) ×1
GLOVE BIOGEL PI ORTHO SZ9 (GLOVE) ×4 IMPLANT
GOWN STRL REUS TWL 2XL XL LVL4 (GOWN DISPOSABLE) ×1 IMPLANT
GOWN STRL REUS W/ TWL LRG LVL3 (GOWN DISPOSABLE) ×1 IMPLANT
GOWN STRL REUS W/TWL LRG LVL3 (GOWN DISPOSABLE) ×1
GUIDEPIN VERSANAIL DSP 3.2X444 (ORTHOPEDIC DISPOSABLE SUPPLIES) IMPLANT
GUIDEWIRE BALL NOSE 100CM (WIRE) IMPLANT
HEMOVAC 400CC 10FR (MISCELLANEOUS) IMPLANT
HFN LAG SCREW 10.5MM X 115MM (Orthopedic Implant) IMPLANT
KIT TURNOVER CYSTO (KITS) ×1 IMPLANT
MANIFOLD NEPTUNE II (INSTRUMENTS) ×1 IMPLANT
MAT ABSORB  FLUID 56X50 GRAY (MISCELLANEOUS) ×1
MAT ABSORB FLUID 56X50 GRAY (MISCELLANEOUS) ×1 IMPLANT
NAIL HIP FRACT 130D 11X180 (Screw) IMPLANT
NS IRRIG 500ML POUR BTL (IV SOLUTION) ×1 IMPLANT
PACK HIP COMPR (MISCELLANEOUS) ×1 IMPLANT
PAD ARMBOARD 7.5X6 YLW CONV (MISCELLANEOUS) ×1 IMPLANT
SCREW BONE CORTICAL 5.0X40 (Screw) IMPLANT
STAPLER SKIN PROX 35W (STAPLE) ×1 IMPLANT
SUCTION FRAZIER HANDLE 10FR (MISCELLANEOUS) ×1
SUCTION TUBE FRAZIER 10FR DISP (MISCELLANEOUS) ×1 IMPLANT
SUT VIC AB 0 CT1 36 (SUTURE) ×2 IMPLANT
SUT VIC AB 2-0 CT1 27 (SUTURE) ×1
SUT VIC AB 2-0 CT1 TAPERPNT 27 (SUTURE) ×1 IMPLANT
SUT VIC AB 2-0 CT2 27 (SUTURE) IMPLANT
SUT VICRYL 0 AB UR-6 (SUTURE) ×1 IMPLANT
SYR 30ML LL (SYRINGE) ×1 IMPLANT
TRAP FLUID SMOKE EVACUATOR (MISCELLANEOUS) ×1 IMPLANT
WATER STERILE IRR 500ML POUR (IV SOLUTION) ×1 IMPLANT

## 2021-12-15 SURGICAL SUPPLY — 34 items
BNDG COHESIVE 4X5 TAN STRL LF (GAUZE/BANDAGES/DRESSINGS) IMPLANT
BRUSH SCRUB EZ  4% CHG (MISCELLANEOUS) ×2
BRUSH SCRUB EZ 4% CHG (MISCELLANEOUS) ×2 IMPLANT
CHLORAPREP W/TINT 26 (MISCELLANEOUS) ×1 IMPLANT
DRAPE 3/4 80X56 (DRAPES) ×1 IMPLANT
DRAPE U-SHAPE 47X51 STRL (DRAPES) ×1 IMPLANT
DRSG AQUACEL AG ADV 3.5X 4 (GAUZE/BANDAGES/DRESSINGS) IMPLANT
DRSG AQUACEL AG ADV 3.5X10 (GAUZE/BANDAGES/DRESSINGS) ×1 IMPLANT
ELECT REM PT RETURN 9FT ADLT (ELECTROSURGICAL) ×1
ELECTRODE REM PT RTRN 9FT ADLT (ELECTROSURGICAL) ×1 IMPLANT
GAUZE XEROFORM 1X8 LF (GAUZE/BANDAGES/DRESSINGS) ×1 IMPLANT
GLOVE BIOGEL PI IND STRL 8.5 (GLOVE) ×1 IMPLANT
GLOVE BIOGEL PI INDICATOR 8.5 (GLOVE) ×1
GLOVE SURG ORTHO 8.5 STRL (GLOVE) ×1 IMPLANT
GOWN STRL REUS W/ TWL LRG LVL3 (GOWN DISPOSABLE) ×1 IMPLANT
GOWN STRL REUS W/ TWL XL LVL3 (GOWN DISPOSABLE) ×1 IMPLANT
GOWN STRL REUS W/TWL LRG LVL3 (GOWN DISPOSABLE) ×1
GOWN STRL REUS W/TWL XL LVL3 (GOWN DISPOSABLE) ×1
KIT PATIENT CARE HANA TABLE (KITS) ×1 IMPLANT
KIT TURNOVER CYSTO (KITS) ×1 IMPLANT
MANIFOLD NEPTUNE II (INSTRUMENTS) ×1 IMPLANT
MAT ABSORB  FLUID 56X50 GRAY (MISCELLANEOUS) ×1
MAT ABSORB FLUID 56X50 GRAY (MISCELLANEOUS) ×1 IMPLANT
NEEDLE SPNL 20GX3.5 QUINCKE YW (NEEDLE) ×1 IMPLANT
NS IRRIG 1000ML POUR BTL (IV SOLUTION) ×1 IMPLANT
PACK HIP COMPR (MISCELLANEOUS) ×1 IMPLANT
STAPLER SKIN PROX 35W (STAPLE) ×1 IMPLANT
SUT VIC AB 0 CT1 36 (SUTURE) ×1 IMPLANT
SUT VIC AB 2-0 CT1 27 (SUTURE) ×2
SUT VIC AB 2-0 CT1 TAPERPNT 27 (SUTURE) ×2 IMPLANT
SYR 30ML LL (SYRINGE) ×1 IMPLANT
TOWEL OR 17X26 4PK STRL BLUE (TOWEL DISPOSABLE) ×1 IMPLANT
TRAP FLUID SMOKE EVACUATOR (MISCELLANEOUS) ×1 IMPLANT
WATER STERILE IRR 500ML POUR (IV SOLUTION) ×1 IMPLANT

## 2021-12-15 NOTE — Anesthesia Preprocedure Evaluation (Addendum)
Anesthesia Evaluation  Patient identified by MRN, date of birth, ID band Patient awake    Reviewed: Allergy & Precautions, NPO status , Patient's Chart, lab work & pertinent test results  History of Anesthesia Complications Negative for: history of anesthetic complications  Airway Mallampati: IV   Neck ROM: Full    Dental  (+) Edentulous Upper, Edentulous Lower   Pulmonary neg pulmonary ROS,    Pulmonary exam normal breath sounds clear to auscultation       Cardiovascular hypertension, Normal cardiovascular exam Rhythm:Regular Rate:Normal  ECG 12/14/21:  Junctional rhythm RBBB and LAFB Probable left ventricular hypertrophy   Neuro/Psych Seizures -,   Neuromuscular disease (Parkinson disease; uses walker) CVA (2017), No Residual Symptoms    GI/Hepatic negative GI ROS,   Endo/Other  diabetes, Type 2, Insulin Dependent  Renal/GU      Musculoskeletal   Abdominal   Peds  Hematology negative hematology ROS (+)   Anesthesia Other Findings   Reproductive/Obstetrics                            Anesthesia Physical Anesthesia Plan  ASA: 3 and emergent  Anesthesia Plan: General   Post-op Pain Management:    Induction: Intravenous  PONV Risk Score and Plan: 2 and Ondansetron, Dexamethasone and Treatment may vary due to age or medical condition  Airway Management Planned: Oral ETT  Additional Equipment:   Intra-op Plan:   Post-operative Plan: Extubation in OR  Informed Consent: I have reviewed the patients History and Physical, chart, labs and discussed the procedure including the risks, benefits and alternatives for the proposed anesthesia with the patient or authorized representative who has indicated his/her understanding and acceptance.     Dental advisory given  Plan Discussed with: CRNA  Anesthesia Plan Comments: (Patient and family at bedside consented for risks of anesthesia  including but not limited to:  - adverse reactions to medications - damage to eyes, teeth, lips or other oral mucosa - nerve damage due to positioning  - sore throat or hoarseness - damage to heart, brain, nerves, lungs, other parts of body or loss of life  Informed patient about role of CRNA in peri- and intra-operative care.  Patient voiced understanding.)       Anesthesia Quick Evaluation

## 2021-12-15 NOTE — Op Note (Signed)
DATE OF SURGERY:  12/15/2021  TIME: 2:57 PM  PATIENT NAME:  Chrystie Nose Zinni Sr.  AGE: 81 y.o.  PRE-OPERATIVE DIAGNOSIS:  Right Hip Intertrochantric Fracture  POST-OPERATIVE DIAGNOSIS:  SAME  PROCEDURE: Intramedullary fixation for right intertrochanteric hip fracture  SURGEON:  [place]  OPERATIVE IMPLANTS: Biomet short Affixus nail 11 x 380, 115 mm lag screw with a 40 mm distal interlocking screw  PREOPERATIVE INDICATIONS:  Enedina Finner Sr. is a 81 y.o. year old who fell and suffered a mildly displaced intertrochanteric hip fracture. He was brought into the ER and then admitted and medically cleared for surgical intervention.    The risks, benefits and alternatives were discussed with the patient and their family.  The risks include but are not limited to infection, bleeding, nerve or blood vessel injury, malunion, nonunion, hardware prominence, hardware failure, change in leg lengths or lower extremity rotation need for further surgery including hardware removal with conversion to a total hip arthroplasty. Medical risks include but are not limited to DVT and pulmonary embolism, myocardial infarction, stroke, pneumonia, respiratory failure and death. The patient and their family understood these risks and wished to proceed with surgery.  OPERATIVE PROCEDURE:  The patient was brought to the operating room and placed in the supine position on the fracture table.  General anesthesia was administered.  A time out was performed to verify the patient's name, date of birth, medical record number, correct site of surgery correct procedure to be performed. The timeout was also used to verify the patient received antibiotics and all appropriate instruments, implants and radiographic studies were available in the room. Once all in attendance were in agreement, the case began. A closed reduction was performed under C-arm guidance.  The fracture reduction was confirmed on both AP and lateral  views.  The patient was prepped and draped in a sterile fashion. He received preoperative antibiotics with 2 g of Ancef IV.  Tranexamic acid was not administered due to the patient's history of stroke.  An incision was made proximal to the greater trochanter in line with the femur. A guidewire was placed over the tip of the greater trochanter and advanced into the proximal femur to the level of the lesser trochanter.  Confirmation of the drill pin position was made on AP and lateral C-arm images.  The threaded guidepin was then overdrilled with the proximal femoral drill.  A short 11 x 80 mm nail was used for this case.  Given the patient had mild narrowing of the midshaft of the femur.  Therefore patient had a long tipped guidewire placed down the femoral shaft.  Sequential reamers were used beginning with 9 mm up to 13 mm.  The nail was then inserted into the proximal femur, across the fracture site and into the femoral shaft. Its position was confirmed on AP and lateral C-arm images.   Once the nail was completely seated, the drill guide for the lag screw was placed through the guide arm for the Affixus nail. A guidepin was then placed through this drill guide and advanced through the lateral cortex of the femur, across the fracture site and into the femoral head achieving a tip apex distance of less than 25 mm. The depth of the drill pin was measured 250 mm, and then the drill for the lag screw was advanced over the guidepin and through the lateral cortex, across the fracture site and up into the femoral head to the depth previously measured.  The lag screw  was then advanced by hand into position across the fracture site into the femoral head. Its final position was confirmed on AP and lateral C-arm images. Compression was applied as traction was carefully released. The set screw in the top of the intramedullary rod was tightened by hand using a screwdriver. It was backed off a quarter turn to allow for  compression at the fracture site.  The drill sleeve for the distal interlocking screw was then placed through the Affixus guide arm. A small stab incision was made to allow the drill guide to approximate the lateral cortex of the femur. The drill for the distal interlocking screw was then advanced bicortically. The length of this drill was measured. A distal interlocking screw with the length of 40 was then inserted by hand through the guide arm. Final C-arm images of the entire intramedullary construct were taken in both the AP and lateral planes.   The wounds were irrigated copiously and closed with 0 Vicryl for closure of the deep fascia and 2-0 Vicryl for subcutaneous closure. The skin was approximated with staples. A dry sterile dressing was applied. I was scrubbed and present the entire case and all sharp and instrument counts were correct at the conclusion of the case. Patient was transferred to hospital bed and brought to PACU in stable condition. I spoke with the patient's family in the postop consultation room to let them know the case had been performed without complication and the patient was stable in the recovery room.     Timoteo Gaul, MD

## 2021-12-15 NOTE — Assessment & Plan Note (Addendum)
Na 129 on admission.  Family report poor PO intake recently.  Improved with IV fluids. Has been stable since off IV fluids. Na this AM 133.  --Monitor BMP --Encourage PO intake and hydration

## 2021-12-15 NOTE — Plan of Care (Signed)

## 2021-12-15 NOTE — Consult Note (Signed)
Full note to follow. Plan trochanteric femoral nail right hip today.

## 2021-12-15 NOTE — Progress Notes (Signed)
Initial Nutrition Assessment  DOCUMENTATION CODES:   Not applicable  INTERVENTION:  Once diet resumes, recommend: Regular diet Ensure Enlive po BID, each supplement provides 350 kcal and 20 grams of protein. MVI with minerals daily  NUTRITION DIAGNOSIS:   Increased nutrient needs related to hip fracture, post-op healing as evidenced by estimated needs.  GOAL:   Patient will meet greater than or equal to 90% of their needs  MONITOR:   PO intake, Supplement acceptance, Diet advancement, Labs, Weight trends, Skin  REASON FOR ASSESSMENT:   Consult Hip fracture protocol  ASSESSMENT:   Pt admitted after a fall leading to R hip pain. PMH significant for T2DM, essential HTN, Parkinson's disease and stroke.  Patient off the unit at time of visit for repair. Unable to obtain detailed nutrition related history at this time.   No documented meal completions on file. Pt remains NPO for procedure.   Reviewed weight history. It appears pt has had a 14% weight loss since 04/25/21.  Medications: Vitamin C, Novolog 70/30 8 units BID, synthroid, klor-con  Labs: sodium 130, potassium 3.0, corrected calcium 8.44, CBG's 153-169 x24 hours  I/O's: +621ml since admission  NUTRITION - FOCUSED PHYSICAL EXAM: RD working remotely. Deferred to follow up.   Diet Order:   Diet Order             Diet NPO time specified  Diet effective now                   EDUCATION NEEDS:   No education needs have been identified at this time  Skin:  Skin Assessment: Skin Integrity Issues: Skin Integrity Issues:: Incisions Incisions: R hip (closed)  Last BM:  PTA  Height:   Ht Readings from Last 1 Encounters:  12/15/21 5\' 10"  (1.778 m)    Weight:   Wt Readings from Last 1 Encounters:  12/15/21 75.8 kg   BMI:  Body mass index is 23.98 kg/m.  Estimated Nutritional Needs:   Kcal:  1900-2100  Protein:  95-110g  Fluid:  >/=1.9L  Clayborne Dana, RDN, LDN Clinical Nutrition

## 2021-12-15 NOTE — Consult Note (Signed)
ORTHOPAEDIC CONSULTATION  REQUESTING PHYSICIAN: [name], DO  Chief Complaint: Right intertrochanteric hip fracture  HPI: Gilbert Finner Sr. is a 81 y.o. male with type 2 diabetes, Parkinson's disease and prior stroke who fell getting out of his car landing on the right hip.  Patient was brought to the [facility] emergency department where x-rays revealed an trochanteric hip fracture with displacement.  Patient was seen today in the preoperative area with his family.  Patient denies any significant right hip pain at rest.    Past Medical History:  Diagnosis Date   Choledocholithiasis    Chronic cholecystitis    Diabetes mellitus without complication (HCC)    Hypertension    Parkinson disease ([place])    Stroke [facility])    Past Surgical History:  Procedure Laterality Date   ELBOW SURGERY     ENDOSCOPIC RETROGRADE CHOLANGIOPANCREATOGRAPHY (ERCP) WITH PROPOFOL N/A 06/12/2019   Procedure: ENDOSCOPIC RETROGRADE CHOLANGIOPANCREATOGRAPHY (ERCP) WITH PROPOFOL;  Surgeon: Lucilla Lame, MD;  Location: [facility] ENDOSCOPY;  Service: Endoscopy;  Laterality: N/A;   ERCP N/A 07/10/2019   Procedure: ENDOSCOPIC RETROGRADE CHOLANGIOPANCREATOGRAPHY (ERCP) STENT REMOVAL;  Surgeon: Lucilla Lame, MD;  Location: [facility] ENDOSCOPY;  Service: Endoscopy;  Laterality: N/A;   subtotal cholecystectomy  06/13/2019   Social History   Socioeconomic History   Marital status: Married    Spouse name: Not on file   Number of children: Not on file   Years of education: Not on file   Highest education level: Not on file  Occupational History   Not on file  Tobacco Use   Smoking status: Never   Smokeless tobacco: Never  Vaping Use   Vaping Use: Never used  Substance and Sexual Activity   Alcohol use: No   Drug use: Never   Sexual activity: Not on file  Other Topics Concern   Not on file  Social History Narrative   Not on file   Social Determinants of Health   Financial Resource Strain: Not on file   Food Insecurity: Not on file  Transportation Needs: Not on file  Physical Activity: Not on file  Stress: Not on file  Social Connections: Not on file   Family History  Problem Relation Age of Onset   Brain cancer Mother    Prostate cancer Neg Hx    Bladder Cancer Neg Hx    Kidney cancer Neg Hx    No Known Allergies Prior to Admission medications   Medication Sig Start Date End Date Taking? Authorizing Provider  atenolol (TENORMIN) 50 MG tablet Take 50 mg by mouth daily.   Yes [provider]  acetaminophen (TYLENOL) 325 MG tablet Take 2 tablets (650 mg total) by mouth every 6 (six) hours as needed for mild pain or fever. 04/22/21   Richarda Osmond, MD  amLODipine (NORVASC) 2.5 MG tablet Take 1 tablet (2.5 mg total) by mouth daily. 04/29/21   Loletha Grayer, MD  aspirin EC 81 MG EC tablet Take 1 tablet (81 mg total) by mouth daily. 05/10/16   Gladstone Lighter, MD  atorvastatin (LIPITOR) 80 MG tablet Take 40 mg by mouth at bedtime.     [provider]  carbidopa-levodopa (SINEMET IR) 25-100 MG tablet Take 1 tablet by mouth 3 (three) times daily. 12/07/18   [provider]  colchicine 0.6 MG tablet Take 1 tablet by mouth daily as needed. 07/04/20   [provider]  divalproex (DEPAKOTE ER) 500 MG 24 hr tablet Take 500 mg by mouth 2 (two)  times daily.     [provider]  hydrALAZINE (APRESOLINE) 100 MG tablet Take 1 tablet (100 mg total) by mouth 3 (three) times daily. 04/22/21 05/22/21  Richarda Osmond, MD  HYDROcodone-acetaminophen (NORCO/VICODIN) 5-325 MG tablet Take 1 tablet by mouth every 8 (eight) hours as needed for severe pain. 04/29/21   Loletha Grayer, MD  insulin aspart protamine- aspart (NOVOLOG MIX 70/30) (70-30) 100 UNIT/ML injection Inject 8 u under the skin every morning and inject 8u with dinner 04/29/21   Loletha Grayer, MD  levothyroxine (SYNTHROID, LEVOTHROID) 75 MCG tablet Take 75 mcg by mouth daily before breakfast.     [provider]  lisinopril (PRINIVIL,ZESTRIL) 40 MG tablet Take 1 tablet (40 mg total) by mouth at bedtime. 05/10/16   Gladstone Lighter, MD  rivastigmine (EXELON) 1.5 MG capsule Take 1.5 mg by mouth 2 (two) times daily. 03/31/21   [provider]  vitamin C (ASCORBIC ACID) 500 MG tablet Take 1,000 mg by mouth daily.    [provider]   DG Hip Unilat With Pelvis 2-3 Views Right  Result Date: 12/14/2021 CLINICAL DATA:  Fall and right hip pain. EXAM: DG HIP (WITH OR WITHOUT PELVIS) 2-3V RIGHT COMPARISON:  None Available. FINDINGS: Minimally displaced and comminuted appearing intertrochanteric fracture of the right femoral neck. No other acute fracture. There is no dislocation. The bones are osteopenic. Moderate arthritic changes of the hips. The soft tissues are unremarkable. IMPRESSION: Intertrochanteric fracture of the right femoral neck. Electronically Signed   By: Anner Crete M.D.   On: 12/14/2021 20:00    Positive ROS: All other systems have been reviewed and were otherwise negative with the exception of those mentioned in the HPI and as above.  Physical Exam: General: Alert, no acute distress  MUSCULOSKELETAL: Right lower extremity: Patient skin is intact overlying the right hip.  There is no erythema ecchymosis or significant swelling.  His thigh and leg compartments are soft and compressible.  Patient has mild external rotation and shortening of the right lower extremity.  He has palpable pedal pulses, intact sensation to touch intact motor function distally.  Assessment: Right displaced intertrochanteric hip fracture  Plan: I explained to the patient and his family the nature of his fracture.  I recommended intramedullary fixation for his fracture.  I discussed the details of the operation as well as the postoperative course.  I answered all questions by the patient and his family.  I marked the right hip according to hospital's correct site of surgery  protocol.  I discussed the risks and benefits of surgery. The risks include but are not limited to infection, bleeding requiring blood transfusion, nerve or blood vessel injury, joint stiffness or loss of motion, persistent pain, weakness or instability, malunion, nonunion and hardware failure and the need for further surgery. Medical risks include but are not limited to DVT and pulmonary embolism, myocardial infarction, stroke, pneumonia, respiratory failure and death. Patient and his family understood these risks and wished to proceed.      Thornton Park, MD    12/15/2021 12:25 PM

## 2021-12-15 NOTE — Anesthesia Procedure Notes (Signed)
Procedure Name: Intubation Date/Time: 12/15/2021 1:00 PM  Performed by: Cammie Sickle, CRNAPre-anesthesia Checklist: Patient identified, Patient being monitored, Timeout performed, Emergency Drugs available and Suction available Patient Re-evaluated:Patient Re-evaluated prior to induction Oxygen Delivery Method: Circle system utilized Preoxygenation: Pre-oxygenation with 100% oxygen Induction Type: IV induction Ventilation: Mask ventilation without difficulty Laryngoscope Size: 3 and McGraph Grade View: Grade I Tube type: Oral Tube size: 7.0 mm Number of attempts: 1 Airway Equipment and Method: Stylet Placement Confirmation: ETT inserted through vocal cords under direct vision, positive ETCO2 and breath sounds checked- equal and bilateral Secured at: 22 cm Tube secured with: Tape Dental Injury: Teeth and Oropharynx as per pre-operative assessment  Comments: Pt. Induced with anesthesia in his bed for his comfort.

## 2021-12-15 NOTE — Progress Notes (Addendum)
  Progress Note   Patient: Gilbert BERNABEI Sr. BWG:[id number] DOB: 1940-09-15 DOA: 12/14/2021     1 DOS: the patient was seen and examined on 12/15/2021   Brief hospital course: "81 y.o. Caucasian male with medical history significant for type 2 diabetes mellitus, essential hypertension, Parkinson's disease and stroke, who presented to the emergency room with acute onset of accidental mechanical fall with subsequent right hip pain."  Right hip x-ray showed intertrochanteric fracture of the right femoral neck.  Orthopedic surgery was consulted.  Patient underwent ORIF with intramedullary nail on 8/22.    Assessment and Plan: * Closed right hip fracture [facility]) Orthopedic surgery following. Taken to OR today, IM nail placed. PT/OT evaluations. Pain control, bowel regimen. DVT prophylaxis. Follow Ortho's recommendations.   Hypertensive urgency Continue home meds. PRN IV hydralazine  Pain control  Hypokalemia K replaced on admission. Monitor and replace PRN  Dyslipidemia Continue statin   Hyponatremia Na 129 on admission.  Family report poor PO intake recently.  Improving with IV fluids. --Continue fluids and monitor BMP  Parkinson disease ([place]) Ccontinue Sinemet  Hypothyroidism Continue Synthroid  Type II diabetes mellitus with renal manifestations ([place]) Question if baseline stage II CKD (given diabetes) vs mild AKI.  Baseline renal function unclear.  Started on IV fluids on admission.  --Continue basal and sliding scale insulin --Adjust insulin as needed         Subjective: Pt seen with family at bedside, before surgery today.  He reports pain level tolerable if he remains still.  No other acute complaints.  Eager for surgery.    Physical Exam: Vitals:   12/15/21 1500 12/15/21 1515 12/15/21 1520 12/15/21 1530  BP: (!) 179/70 (!) 176/85 (!) 176/69 (!) 147/61  Pulse: (!) 57 65 68 (!) 59  Resp: 18 15 12 20   Temp:    97.6 F (36.4 C)  TempSrc:      SpO2:  100% 100% 94% 93%  Weight:      Height:       General exam: awake, alert, no acute distress HEENT: atraumatic, clear conjunctiva, anicteric sclera, oist mucus membranes, hearing grossly normal  Respiratory system: CTAB, no wheezes, rales or rhonchi, normal respiratory effort. Cardiovascular system: normal S1/S2, RRR, no JVD, murmurs, rubs, gallops, no pedal edema.   Gastrointestinal system: soft, NT, ND, no HSM felt, +bowel sounds. Central nervous system: A&O x3. no gross focal neurologic deficits, normal speech Extremities: no edema, normal tone Skin: dry, intact, normal temperature Psychiatry: normal mood, flat affect, judgement and insight appear normal    Data Reviewed:  Notable labs -- sodium 130, potassium 3.0, glucose 187, Ca 7.9, Hbg 10.2, platelets 113  Family Communication: multiple family members at bedside.  Disposition: Status is: Inpatient Remains inpatient appropriate because: surgery today and requires PT/OT evaluations tomorrow for disposition planning. May require SNF/rehab.   Planned Discharge Destination: Home with Home Health vs SNF     Time spent: 35 minutes  Author: [name], DO 12/15/2021 5:04 PM  For on call review [email or web address].

## 2021-12-15 NOTE — Transfer of Care (Signed)
Immediate Anesthesia Transfer of Care Note  Patient: Gilbert [name].  Procedure(s) Performed: INTRAMEDULLARY (IM) NAIL INTERTROCHANTERIC (Right: Hip)  Patient Location: PACU  Anesthesia Type:General  Level of Consciousness: drowsy  Airway & Oxygen Therapy: Patient Spontanous Breathing and Patient connected to face mask oxygen  Post-op Assessment: Report given to RN and Post -op Vital signs reviewed and stable  Post vital signs: Reviewed and stable  Last Vitals:  Vitals Value Taken Time  BP 179/70 12/15/21 1500  Temp 37.2 C 12/15/21 1455  Pulse 60 12/15/21 1500  Resp 16 12/15/21 1500  SpO2 100 % 12/15/21 1500  Vitals shown include unvalidated device data.  Last Pain:  Vitals:   12/15/21 1040  TempSrc: Oral  PainSc: 0-No pain      Patients Stated Pain Goal: 4 (03/40/35 2481)  Complications: No notable events documented.

## 2021-12-15 NOTE — OR Nursing (Signed)
Patient to OR with periwick urinary bag. Periwick remains intact during procedure.

## 2021-12-16 ENCOUNTER — Encounter: Payer: Self-pay | Admitting: Orthopedic Surgery

## 2021-12-16 DIAGNOSIS — D62 Acute posthemorrhagic anemia: Secondary | ICD-10-CM

## 2021-12-16 DIAGNOSIS — N179 Acute kidney failure, unspecified: Secondary | ICD-10-CM | POA: Diagnosis not present

## 2021-12-16 DIAGNOSIS — E871 Hypo-osmolality and hyponatremia: Secondary | ICD-10-CM

## 2021-12-16 DIAGNOSIS — N189 Chronic kidney disease, unspecified: Secondary | ICD-10-CM

## 2021-12-16 DIAGNOSIS — S72001A Fracture of unspecified part of neck of right femur, initial encounter for closed fracture: Secondary | ICD-10-CM | POA: Diagnosis not present

## 2021-12-16 LAB — GLUCOSE, CAPILLARY
Glucose-Capillary: 260 mg/dL — ABNORMAL HIGH (ref 70–99)
Glucose-Capillary: 241 mg/dL — ABNORMAL HIGH (ref 70–99)
Glucose-Capillary: 283 mg/dL — ABNORMAL HIGH (ref 70–99)
Glucose-Capillary: 265 mg/dL — ABNORMAL HIGH (ref 70–99)

## 2021-12-16 LAB — BASIC METABOLIC PANEL
Anion gap: 7 (ref 5–15)
BUN: 21 mg/dL (ref 8–23)
CO2: 23 mmol/L (ref 22–32)
Calcium: 7.6 mg/dL — ABNORMAL LOW (ref 8.9–10.3)
Chloride: 102 mmol/L (ref 98–111)
Creatinine, Ser: 1.42 mg/dL — ABNORMAL HIGH (ref 0.61–1.24)
GFR, Estimated: 49 mL/min — ABNORMAL LOW (ref >60)
Glucose, Bld: 266 mg/dL — ABNORMAL HIGH (ref 70–99)
Potassium: 3.7 mmol/L (ref 3.5–5.1)
Sodium: 132 mmol/L — ABNORMAL LOW (ref 135–145)

## 2021-12-16 LAB — MAGNESIUM: Magnesium: 1.6 mg/dL — ABNORMAL LOW (ref 1.7–2.4)

## 2021-12-16 LAB — CBC
HCT: 22.5 % — ABNORMAL LOW (ref 39.0–52.0)
Hemoglobin: 7.6 g/dL — ABNORMAL LOW (ref 13.0–17.0)
MCH: 30.6 pg (ref 26.0–34.0)
MCHC: 33.8 g/dL (ref 30.0–36.0)
MCV: 90.7 fL (ref 80.0–100.0)
Platelets: 109 10*3/uL — ABNORMAL LOW (ref 150–400)
RBC: 2.48 MIL/uL — ABNORMAL LOW (ref 4.22–5.81)
RDW: 14.6 % (ref 11.5–15.5)
WBC: 8.5 10*3/uL (ref 4.0–10.5)
nRBC: 0 % (ref 0.0–0.2)

## 2021-12-16 LAB — HEMOGLOBIN AND HEMATOCRIT, BLOOD
HCT: 23.6 % — ABNORMAL LOW (ref 39.0–52.0)
Hemoglobin: 7.8 g/dL — ABNORMAL LOW (ref 13.0–17.0)

## 2021-12-16 MED ORDER — OXYCODONE HCL 5 MG PO TABS: 5.0000 mg | ORAL_TABLET | Freq: Four times a day (QID) | ORAL | Status: DC | PRN

## 2021-12-16 MED ORDER — MAGNESIUM SULFATE 4 GM/100ML IV SOLN
4.0000 g | Freq: Once | INTRAVENOUS | Status: AC
  Administered 2021-12-16: 4 g via INTRAVENOUS
  Filled 2021-12-16: qty 100

## 2021-12-16 MED ORDER — ASPIRIN 81 MG PO TBEC
81.0000 mg | DELAYED_RELEASE_TABLET | Freq: Every day | ORAL | Status: DC
  Administered 2021-12-16: 81 mg via ORAL
  Filled 2021-12-16: qty 1

## 2021-12-16 MED ORDER — INSULIN ASPART PROT & ASPART (70-30 MIX) 100 UNIT/ML ~~LOC~~ SUSP
10.0000 [IU] | Freq: Two times a day (BID) | SUBCUTANEOUS | Status: DC
  Administered 2021-12-16 – 2021-12-21 (×11): 10 [IU] via SUBCUTANEOUS

## 2021-12-16 MED ORDER — MORPHINE SULFATE (PF) 2 MG/ML IV SOLN: 0.5000 mg | INTRAVENOUS | Status: DC | PRN

## 2021-12-16 MED ORDER — GLUCERNA SHAKE PO LIQD
237.0000 mL | Freq: Three times a day (TID) | ORAL | Status: DC
  Administered 2021-12-16 – 2021-12-21 (×12): 237 mL via ORAL

## 2021-12-16 MED ORDER — MEGESTROL ACETATE 20 MG PO TABS
20.0000 mg | ORAL_TABLET | Freq: Every day | ORAL | Status: DC
  Administered 2021-12-16 – 2021-12-21 (×6): 20 mg via ORAL
  Filled 2021-12-16 (×7): qty 1

## 2021-12-16 MED ORDER — ACETAMINOPHEN 500 MG PO TABS
1000.0000 mg | ORAL_TABLET | Freq: Three times a day (TID) | ORAL | Status: DC
  Administered 2021-12-16 – 2021-12-21 (×14): 1000 mg via ORAL
  Filled 2021-12-16 (×15): qty 2

## 2021-12-16 NOTE — Evaluation (Signed)
Occupational Therapy Evaluation Patient Details Name: Gilbert WUTHRICH Sr. MRN: [id number] DOB: Feb 01, 1941 Today's Date: 12/16/2021   History of Present Illness Gilbert BUNGERT Sr. is a 81 y.o. Caucasian male with medical history significant for type 2 diabetes mellitus, essential hypertension, Parkinson's disease and stroke, who presented to the emergency room with acute onset of accidental mechanical fall with subsequent right hip pain.  The patient was getting out of a car and lost his balance with subsequent fall on his right side.  He denied any headache or dizziness or blurred vision. S/P R hip ORIF.   Clinical Impression   Gilbert [name] presents with generalized weakness, limited endurance, impaired balance, and pain. Prior to fall/fx, pt has lived at home with his wife, staying on first floor of house, 5 STE. He reports 5-6 falls in previous year. He does not drive, sometimes requires assistance from his wife for dressing, and showers only when she is available to provide supervision. He reports rarely leaving his house. During today's evaluation, pt reports no pain during static sitting, with pain shooting up to 4/10 during movement. He requires +2 Min A to come into standing, and Min A +1 to maintain standing balance for ~ 5 minutes x 3 trials. Pt states that, after this fall, he is "never going to get up out of a chair again." Provided educ re: falls prevention, importance of regular movement and active participation in rehab process. Will provide ongoing OT during hospitalization, with DC to SNF anticipated.    Recommendations for follow up therapy are one component of a multi-disciplinary discharge planning process, led by the attending physician.  Recommendations may be updated based on patient status, additional functional criteria and insurance authorization.   Follow Up Recommendations  Skilled nursing-short term rehab (<3 hours/day)    Assistance Recommended at Discharge     Patient can return home with the following A lot of help with walking and/or transfers;A lot of help with bathing/dressing/bathroom;Assistance with cooking/housework;Assist for transportation;Help with stairs or ramp for entrance    Functional Status Assessment  Patient has had a recent decline in their functional status and demonstrates the ability to make significant improvements in function in a reasonable and predictable amount of time.  Equipment Recommendations  None recommended by OT    Recommendations for Other Services       Precautions / Restrictions Precautions Precautions: Fall Restrictions Weight Bearing Restrictions: Yes RLE Weight Bearing: Weight bearing as tolerated      Mobility Bed Mobility               General bed mobility comments: pt received, left in recliner    Transfers Overall transfer level: Needs assistance Equipment used: Rolling walker (2 wheels) Transfers: Sit to/from Stand Sit to Stand: Min assist, +2 physical assistance           General transfer comment: cues for hand placement      Balance Overall balance assessment: Needs assistance, History of Falls Sitting-balance support: Feet supported Sitting balance-Leahy Scale: Good   Postural control: Left lateral lean Standing balance support: During functional activity, Bilateral upper extremity supported, Reliant on assistive device for balance Standing balance-Leahy Scale: Fair                             ADL either performed or assessed with clinical judgement   ADL Overall ADL's : Needs assistance/impaired Eating/Feeding: Independent;Set up  Lower Body Dressing: Maximal assistance                 General ADL Comments: Anticipate Mod-Max A for all OOB fxl mobility     Vision Patient Visual Report: No change from baseline       Perception     Praxis      Pertinent Vitals/Pain Pain Assessment Pain Assessment:  Faces Faces Pain Scale: Hurts little more Pain Location: R LE Pain Descriptors / Indicators: Discomfort, Moaning, Guarding, Grimacing, Sore, Aching Pain Intervention(s): Repositioned, Utilized relaxation techniques, Limited activity within patient's tolerance     Hand Dominance Right   Extremity/Trunk Assessment Upper Extremity Assessment Upper Extremity Assessment: Overall WFL for tasks assessed   Lower Extremity Assessment Lower Extremity Assessment: RLE deficits/detail RLE Deficits / Details: s/p IM fixation of R intertrochantric hip fx RLE: Unable to fully assess due to pain RLE Coordination: decreased gross motor   Cervical / Trunk Assessment Cervical / Trunk Assessment: Normal   Communication Communication Communication: HOH   Cognition Arousal/Alertness: Awake/alert Behavior During Therapy: WFL for tasks assessed/performed Overall Cognitive Status: Within Functional Limits for tasks assessed                                 General Comments: Pt states he is "having trouble with thinking," occasionally loses track of what he is talking about in mid-sentence     General Comments       Exercises Other Exercises Other Exercises: seated UE therex. Educ re: falls prevention, importance of movement, engagement in meaningful occupations   Shoulder Instructions      Home Living Family/patient expects to be discharged to:: Private residence Living Arrangements: Spouse/significant other Available Help at Discharge: Family;Available PRN/intermittently Type of Home: House Home Access: Stairs to enter   Entrance Stairs-Rails: Right;Left Home Layout: Two level;Able to live on main level with bedroom/bathroom     Bathroom Shower/Tub: Teacher, early years/pre: Standard     Home Equipment: Conservation officer, nature (2 wheels);Cane - single point;BSC/3in1;Grab bars - tub/shower          Prior Functioning/Environment Prior Level of Function : Needs  assist             Mobility Comments: patient reports multiple falls, uses rolling walker at baseline. Lives with wife, reports 5-6 falls in previous 2 years. ADLs Comments: requires asst for bathing, dressing, all IADL        OT Problem List: Decreased strength;Decreased range of motion;Decreased activity tolerance;Impaired balance (sitting and/or standing);Pain      OT Treatment/Interventions: Self-care/ADL training;Therapeutic exercise;Patient/family education;Balance training;Therapeutic activities;DME and/or AE instruction    OT Goals(Current goals can be found in the care plan section) Acute Rehab OT Goals Patient Stated Goal: to walk again OT Goal Formulation: With patient Time For Goal Achievement: 12/30/21 Potential to Achieve Goals: Good ADL Goals Pt Will Transfer to Toilet: with mod assist;grab bars;regular height toilet Pt Will Perform Tub/Shower Transfer: with mod assist;ambulating;grab bars;shower seat Additional ADL Goal #1: Pt will identify/demonstrate 2+ falls prevention techniques  OT Frequency: Min 2X/week    Co-evaluation              AM-PAC OT "6 Clicks" Daily Activity     Outcome Measure Help from another person eating meals?: None Help from another person taking care of personal grooming?: A Little Help from another person toileting, which includes using toliet, bedpan, or urinal?: A Lot Help  from another person bathing (including washing, rinsing, drying)?: A Lot Help from another person to put on and taking off regular upper body clothing?: A Little Help from another person to put on and taking off regular lower body clothing?: A Lot 6 Click Score: 16   End of Session Equipment Utilized During Treatment: Rolling walker (2 wheels) Nurse Communication: Mobility status  Activity Tolerance: Patient tolerated treatment well Patient left: in chair;with call bell/phone within reach;with chair alarm set  OT Visit Diagnosis: Unsteadiness on feet  (R26.81);Muscle weakness (generalized) (M62.81);History of falling (Z91.81);Pain Pain - Right/Left: Right Pain - part of body: Hip;Leg                Time: 1537-9432 OT Time Calculation (min): 47 min Charges:  OT General Charges $OT Visit: 1 Visit OT Evaluation $OT Eval Moderate Complexity: 1 Mod OT Treatments $Self Care/Home Management : 38-52 mins Josiah Lobo, PhD, MS, OTR/L 12/16/21, 3:14 PM

## 2021-12-16 NOTE — Progress Notes (Signed)
Physical Therapy Treatment Patient Details Name: Gilbert VORA Sr. MRN: [id number] DOB: February 20, 1941 Today's Date: 12/16/2021   History of Present Illness Gilbert DUELL Sr. is a 81 y.o. Caucasian male with medical history significant for type 2 diabetes mellitus, essential hypertension, Parkinson's disease and stroke, who presented to the emergency room with acute onset of accidental mechanical fall with subsequent right hip pain.  The patient was getting out of a car and lost his balance with subsequent fall on his right side.  He denied any headache or dizziness or blurred vision. S/P R hip ORIF.    PT Comments    Patient received in recliner and requested to remain in recliner. He is able to stand with min A +2 with RW. Cues for hand placement and safety. He is able to take a couple of steps forward. Requires max cues for posture, sequencing and safe use of AD. Patient will continue to benefit from skilled PT while here to improve functional independence and safety.      Recommendations for follow up therapy are one component of a multi-disciplinary discharge planning process, led by the attending physician.  Recommendations may be updated based on patient status, additional functional criteria and insurance authorization.  Follow Up Recommendations  Skilled nursing-short term rehab (<3 hours/day) Can patient physically be transported by private vehicle: No   Assistance Recommended at Discharge Frequent or constant Supervision/Assistance  Patient can return home with the following A lot of help with bathing/dressing/bathroom;Assistance with cooking/housework;Assist for transportation;Two people to help with walking and/or transfers   Equipment Recommendations  None recommended by PT    Recommendations for Other Services       Precautions / Restrictions Precautions Precautions: Fall Restrictions Weight Bearing Restrictions: Yes RLE Weight Bearing: Weight bearing as tolerated      Mobility  Bed Mobility Overal bed mobility: Needs Assistance Bed Mobility: Supine to Sit     Supine to sit: Mod assist, HOB elevated     General bed mobility comments: patient received in recliner and remained in recliner    Transfers Overall transfer level: Needs assistance Equipment used: Rolling walker (2 wheels) Transfers: Sit to/from Stand Sit to Stand: Min assist, +2 physical assistance           General transfer comment: cues for hand placement    Ambulation/Gait Ambulation/Gait assistance: Mod assist, +2 physical assistance Gait Distance (Feet): 3 Feet Assistive device: Rolling walker (2 wheels) Gait Pattern/deviations: Step-to pattern, Decreased step length - right, Decreased step length - left, Decreased weight shift to right, Trunk flexed Gait velocity: decreased     General Gait Details: patient requiring max verbal cues for step by step sequencing. +2 assist for safety to take 4-5 steps to recliner.   Stairs             Wheelchair Mobility    Modified Rankin (Stroke Patients Only)       Balance Overall balance assessment: Needs assistance, History of Falls Sitting-balance support: Feet supported Sitting balance-Leahy Scale: Good Sitting balance - Comments: Leaning to left to offweight right side due to pain Postural control: Left lateral lean Standing balance support: During functional activity, Bilateral upper extremity supported, Reliant on assistive device for balance Standing balance-Leahy Scale: Poor Standing balance comment: cues needed for upright posture, sequencing, safety                            Cognition Arousal/Alertness: Awake/alert Behavior During Therapy: [facility]  for tasks assessed/performed Overall Cognitive Status: Within Functional Limits for tasks assessed                                          Exercises Total Joint Exercises Ankle Circles/Pumps: AROM, 10 reps, Both Heel Slides:  AAROM, Right, 10 reps Hip ABduction/ADduction: AAROM, Right, 10 reps Long Arc Quad: AAROM, Right, 10 reps    General Comments        Pertinent Vitals/Pain Pain Assessment Pain Assessment: Faces Faces Pain Scale: Hurts even more Pain Location: R LE Pain Descriptors / Indicators: Discomfort, Moaning, Guarding, Grimacing, Sore Pain Intervention(s): Monitored during session, Repositioned    Home Living Family/patient expects to be discharged to:: Private residence Living Arrangements: Spouse/significant other Available Help at Discharge: Family;Available PRN/intermittently Type of Home: House Home Access: Stairs to enter Entrance Stairs-Rails: Right;Left     Home Layout: Two level;Able to live on main level with bedroom/bathroom Home Equipment: Rolling Walker (2 wheels);Cane - single point;BSC/3in1;Grab bars - tub/shower      Prior Function            PT Goals (current goals can now be found in the care plan section) Acute Rehab PT Goals Patient Stated Goal: to go to [facility] PT Goal Formulation: With patient Time For Goal Achievement: 12/24/21 Potential to Achieve Goals: Fair Progress towards PT goals: Progressing toward goals    Frequency    BID      PT Plan Current plan remains appropriate    Co-evaluation              AM-PAC PT "6 Clicks" Mobility   Outcome Measure  Help needed turning from your back to your side while in a flat bed without using bedrails?: A Lot Help needed moving from lying on your back to sitting on the side of a flat bed without using bedrails?: A Lot Help needed moving to and from a bed to a chair (including a wheelchair)?: A Lot Help needed standing up from a chair using your arms (e.g., wheelchair or bedside chair)?: A Lot Help needed to walk in hospital room?: Total Help needed climbing 3-5 steps with a railing? : Total 6 Click Score: 10    End of Session Equipment Utilized During Treatment: Gait belt Activity  Tolerance: Patient limited by pain Patient left: in chair;with call bell/phone within reach;with chair alarm set;with family/visitor present Nurse Communication: Mobility status PT Visit Diagnosis: Unsteadiness on feet (R26.81);Other abnormalities of gait and mobility (R26.89);Muscle weakness (generalized) (M62.81);Difficulty in walking, not elsewhere classified (R26.2);History of falling (Z91.81);Pain Pain - Right/Left: Right Pain - part of body: Leg     Time: 8527-7824 PT Time Calculation (min) (ACUTE ONLY): 17 min  Charges:  $Gait Training: 8-22 mins $Therapeutic Exercise: 8-22 mins                     Gilbert [name], PT, GCS 12/16/21,2:31 PM

## 2021-12-16 NOTE — Inpatient Diabetes Management (Signed)
Inpatient Diabetes Program Recommendations  AACE/ADA: New Consensus Statement on Inpatient Glycemic Control (2015)  Target Ranges:  Prepandial:   less than 140 mg/dL      Peak postprandial:   less than 180 mg/dL (1-2 hours)      Critically ill patients:  140 - 180 mg/dL    Latest Reference Range & Units 12/15/21 07:55 12/15/21 10:33 12/15/21 14:58 12/15/21 15:52 12/15/21 17:32  Glucose-Capillary 70 - 99 mg/dL 153 (H) 155 (H)  5 mg Decadron @1300  169 (H) 201 (H) 206 (H)  8 units 70/30 Insulin  (H): Data is abnormally high  Latest Reference Range & Units 12/16/21 08:13  Glucose-Capillary 70 - 99 mg/dL 260 (H)  (H): Data is abnormally high    Admit with: Hip Fracture  History: DM2  Home DM Meds: Novolog 70/30 Mix Insulin 8 units BID  Current Orders: Novolog 70/30 Mix Insulin 10 units BID    MD- Note pt received Decadron for surgery yest afternoon  Also note 70/30 Insulin dose increased this AM  No current orders for Novolog SSI  Please consider starting Novolog Sensitive Correction Scale/ SSI (0-9 units) TID AC + HS     --Will follow patient during hospitalization--  Wyn Quaker RN, MSN, [place] Diabetes Coordinator Inpatient Glycemic Control Team Team Pager: [phone] (8a-5p)

## 2021-12-16 NOTE — Assessment & Plan Note (Addendum)
Pre-op Hbg was 10.2.  On POD-1 Hbg down to 7.6, then down to 6.4 on POD2.   Transfused 1 unit pRBC's on 8/24. Hbg today is 7.9 yesterday afternoon, this morning 7.6, overall stable --H&H tomorrow 1 -- transfuse if Hbg < 7.0 --monitor surgical site for signs of bleeding

## 2021-12-16 NOTE — Evaluation (Signed)
Physical Therapy Evaluation Patient Details Name: Gilbert ALESI Sr. MRN: [id number] DOB: 1940/05/05 Today's Date: 12/16/2021  History of Present Illness  Gilbert [name] Sr. is a 81 y.o. Caucasian male with medical history significant for type 2 diabetes mellitus, essential hypertension, Parkinson's disease and stroke, who presented to the emergency room with acute onset of accidental mechanical fall with subsequent right hip pain.  The patient was getting out of a car and lost his balance with subsequent fall on his right side.  He denied any headache or dizziness or blurred vision. S/P R hip ORIF.  Clinical Impression  Patient received in bed, he is alert, oriented agrees to PT assessment. Patient requires mod A for bed mobility. Transfers with Mod A from elevated bed. He is able to take about 5 steps with mod +2 assist and RW. Max VCs for sequencing and use of AD. Patient will continue to benefit from skilled PT while here to improve functional independence and safety with mobility.       Recommendations for follow up therapy are one component of a multi-disciplinary discharge planning process, led by the attending physician.  Recommendations may be updated based on patient status, additional functional criteria and insurance authorization.  Follow Up Recommendations Skilled nursing-short term rehab (<3 hours/day)      Assistance Recommended at Discharge Frequent or constant Supervision/Assistance  Patient can return home with the following  Two people to help with walking and/or transfers;A lot of help with bathing/dressing/bathroom;Assist for transportation;Assistance with cooking/housework    Equipment Recommendations None recommended by PT  Recommendations for Other Services       Functional Status Assessment Patient has had a recent decline in their functional status and demonstrates the ability to make significant improvements in function in a reasonable and predictable amount  of time.     Precautions / Restrictions Precautions Precautions: Fall Restrictions Weight Bearing Restrictions: Yes RLE Weight Bearing: Weight bearing as tolerated      Mobility  Bed Mobility Overal bed mobility: Needs Assistance Bed Mobility: Supine to Sit     Supine to sit: Mod assist, HOB elevated          Transfers Overall transfer level: Needs assistance Equipment used: Rolling walker (2 wheels) Transfers: Sit to/from Stand Sit to Stand: Mod assist, From elevated surface                Ambulation/Gait Ambulation/Gait assistance: Mod assist, +2 physical assistance, +2 safety/equipment Gait Distance (Feet): 3 Feet Assistive device: Rolling walker (2 wheels) Gait Pattern/deviations: Step-to pattern, Decreased step length - right, Decreased step length - left Gait velocity: decreased     General Gait Details: patient requiring max verbal cues for step by step sequencing. +2 assist for safety to take 4-5 steps to recliner.  Stairs            Wheelchair Mobility    Modified Rankin (Stroke Patients Only)       Balance Overall balance assessment: Needs assistance, History of Falls Sitting-balance support: Feet supported Sitting balance-Leahy Scale: Fair Sitting balance - Comments: Leaning to left to offweight right side due to pain Postural control: Left lateral lean Standing balance support: During functional activity, Bilateral upper extremity supported, Reliant on assistive device for balance Standing balance-Leahy Scale: Poor                               Pertinent Vitals/Pain Pain Assessment Pain Assessment: Faces Faces Pain  Scale: Hurts whole lot Pain Location: R LE Pain Descriptors / Indicators: Discomfort, Moaning, Guarding, Grimacing, Sore Pain Intervention(s): Limited activity within patient's tolerance, Monitored during session, Premedicated before session, Repositioned    Home Living Family/patient expects to be  discharged to:: Skilled nursing facility                        Prior Function Prior Level of Function : Independent/Modified Independent             Mobility Comments: patient reports multiple falls, uses rolling walker at baseline. Lives with wife       Hand Dominance   Dominant Hand: Right    Extremity/Trunk Assessment   Upper Extremity Assessment Upper Extremity Assessment: Overall WFL for tasks assessed    Lower Extremity Assessment Lower Extremity Assessment: RLE deficits/detail RLE: Unable to fully assess due to pain RLE Coordination: decreased gross motor    Cervical / Trunk Assessment Cervical / Trunk Assessment: Normal  Communication   Communication: HOH  Cognition Arousal/Alertness: Awake/alert Behavior During Therapy: WFL for tasks assessed/performed Overall Cognitive Status: Within Functional Limits for tasks assessed                                          General Comments      Exercises     Assessment/Plan    PT Assessment Patient needs continued PT services  PT Problem List Decreased strength;Decreased mobility;Decreased safety awareness;Decreased activity tolerance;Decreased balance;Decreased range of motion;Decreased knowledge of use of DME;Pain       PT Treatment Interventions DME instruction;Therapeutic exercise;Gait training;Balance training;Functional mobility training;Therapeutic activities;Patient/family education;Neuromuscular re-education    PT Goals (Current goals can be found in the Care Plan section)  Acute Rehab PT Goals Patient Stated Goal: to go to [facility] PT Goal Formulation: With patient Time For Goal Achievement: 12/24/21 Potential to Achieve Goals: Fair    Frequency BID     Co-evaluation               AM-PAC PT "6 Clicks" Mobility  Outcome Measure Help needed turning from your back to your side while in a flat bed without using bedrails?: A Lot Help needed moving from  lying on your back to sitting on the side of a flat bed without using bedrails?: A Lot Help needed moving to and from a bed to a chair (including a wheelchair)?: A Lot Help needed standing up from a chair using your arms (e.g., wheelchair or bedside chair)?: A Lot Help needed to walk in hospital room?: Total Help needed climbing 3-5 steps with a railing? : Total 6 Click Score: 10    End of Session Equipment Utilized During Treatment: Gait belt Activity Tolerance: Patient limited by pain Patient left: in chair;with call bell/phone within reach;with chair alarm set;with SCD's reapplied Nurse Communication: Mobility status PT Visit Diagnosis: Unsteadiness on feet (R26.81);Other abnormalities of gait and mobility (R26.89);Muscle weakness (generalized) (M62.81);Difficulty in walking, not elsewhere classified (R26.2);History of falling (Z91.81);Pain Pain - Right/Left: Right Pain - part of body: Leg    Time: 6195-0932 PT Time Calculation (min) (ACUTE ONLY): 23 min   Charges:   PT Evaluation $PT Eval Moderate Complexity: 1 Mod PT Treatments $Gait Training: 8-22 mins        Delorise Hunkele, PT, GCS 12/16/21,11:15 AM

## 2021-12-16 NOTE — Progress Notes (Signed)
Nutrition Follow-up  DOCUMENTATION CODES:   Not applicable  INTERVENTION:   -D/c Ensure ENlive -Glucerna Shake po TID, each supplement provides 220 kcal and 10 grams of protein  -MVI with minerals daily  NUTRITION DIAGNOSIS:   Increased nutrient needs related to hip fracture, post-op healing as evidenced by estimated needs.  Ongoing  GOAL:   Patient will meet greater than or equal to 90% of their needs  Progressing   MONITOR:   PO intake, Supplement acceptance, Diet advancement, Labs, Weight trends, Skin  REASON FOR ASSESSMENT:   Consult Hip fracture protocol  ASSESSMENT:   Pt admitted after a fall leading to R hip pain. PMH significant for T2DM, essential HTN, Parkinson's disease and stroke.  8/22- s/p Intramedullary fixation for right intertrochanteric hip fracture  Reviewed I/O's: +531 ml x 24 hours and +948 ml since admission  UOP: 1.4 L x 24 hours   Spoke with pt, who was sitting in recliner chair at time of visit. He reports appetite has improved since hospitalization and he had eaten most of his meals. Noted meal completions documented at 60%.  Per pt, he has had a poor appetite over the past 3 months due to altered ability to taste and smell. Per pt, his wife cooks 3 meals per day for him, but he is only able to eat a few bites as it is not appealing to him.   Per pt, he estimates he has lost about 15# over the past 3 months. Reviewed wt hx; pt has experienced a 14% wt loss over the past 8 months, which is significant for time frame.   Discussed importance of good meal and supplement intake to promote healing. Pt amenable to supplements.   Medications reviewed and include vitamin C, colace, and senokot.   Labs reviewed: CBGS: 206-260 (inpatient orders for glycemic control are 10 units insulin aspart protamine-aspart BID).    NUTRITION - FOCUSED PHYSICAL EXAM:  Flowsheet Row Most Recent Value  Orbital Region No depletion  Upper Arm Region Mild  depletion  Thoracic and Lumbar Region No depletion  Buccal Region No depletion  Temple Region Mild depletion  Clavicle Bone Region Mild depletion  Clavicle and Acromion Bone Region No depletion  Scapular Bone Region No depletion  Dorsal Hand No depletion  Patellar Region No depletion  Anterior Thigh Region No depletion  Posterior Calf Region No depletion  Edema (RD Assessment) None  Hair Reviewed  Eyes Reviewed  Mouth Reviewed  Skin Reviewed  Nails Reviewed       Diet Order:   Diet Order             Diet regular Room service appropriate? Yes; Fluid consistency: Thin  Diet effective now                   EDUCATION NEEDS:   No education needs have been identified at this time  Skin:  Skin Assessment: Skin Integrity Issues: Skin Integrity Issues:: Incisions Incisions: R hip (closed)  Last BM:  PTA  Height:   Ht Readings from Last 1 Encounters:  12/15/21 5\' 10"  (1.778 m)    Weight:   Wt Readings from Last 1 Encounters:  12/15/21 75.8 kg    Ideal Body Weight:  75.5 kg  BMI:  Body mass index is 23.98 kg/m.  Estimated Nutritional Needs:   Kcal:  2050-2250  Protein:  100-115 grams  Fluid:  > 2 L    Loistine Chance, RD, LDN, [place] Registered Dietitian II Certified Diabetes Care  and Education Specialist Please refer to [facility] for RD and/or RD on-call/weekend/after hours pager

## 2021-12-16 NOTE — Assessment & Plan Note (Signed)
Baseline Cr appears ~1.1-1.2. Had very mild AKI in post-op setting which resolved. Monitor BMP.

## 2021-12-16 NOTE — Assessment & Plan Note (Addendum)
Cr on admission was at baseline 1.21, improved to 1.03 >> 1.43 >> 1.24 >> 1.00.  Treated initially with IV fluids.  Possible Cr increase was from transient hypotension during surgery.   --Monitor urine output --Bladder scan PRN to monitor for retention --Off IV fluids --Monitor BMP

## 2021-12-16 NOTE — Progress Notes (Signed)
Progress Note   Patient: Gilbert DAWES Sr. JSE:[id number] DOB: 1940/06/14 DOA: 12/14/2021     2 DOS: the patient was seen and examined on 12/16/2021   Brief hospital course: "81 y.o. Caucasian male with medical history significant for type 2 diabetes mellitus, essential hypertension, Parkinson's disease and stroke, who presented to the emergency room with acute onset of accidental mechanical fall with subsequent right hip pain."  Right hip x-ray showed intertrochanteric fracture of the right femoral neck.  Orthopedic surgery was consulted.  Patient underwent ORIF with intramedullary nail on 8/22.    Assessment and Plan: * Closed right hip fracture [facility]) Orthopedic surgery following. Taken to OR today, IM nail placed. PT/OT evaluations. Pain control, bowel regimen. DVT prophylaxis. Follow Ortho's recommendations.   ABLA (acute blood loss anemia) Pre-op Hbg was 10.2, today Hbg down to 7.6 (POD 1).  Repeat H&H at 3pm today and CBC in AM. --transfuse if Hbg < 7.0 --monitor surgical site for signs of bleeding  Hypomagnesemia Replacing for Mg 1.6 today (8/23) Monitor Mg and replace PRN.  Hyponatremia Na 129 on admission.  Family report poor PO intake recently.  Improving with IV fluids. --Continue fluids and monitor BMP  Hypokalemia K replaced on admission. Monitor and replace PRN  Acute kidney injury superimposed on CKD ([place]) Cr on admission was at baseline 1.21, improved to 1.03.  Today, Cr bumped up to 1.43.  Has been on IV fluids. Possible he had transient hypotension during surgery yesterday. --Monitor urine output --Bladder scan to monitor for retention --Renal U/S tomorrow if Cr not improved --Continue IV fluids  CKD (chronic kidney disease), stage IIIa Baseline Cr appears ~1.1-1.2. Currently mild AKI in post-op setting. Mgmt as outlined.  Hypertensive urgency Continue home meds. PRN IV hydralazine  Pain control  Dyslipidemia Continue statin   Parkinson  disease ([place]) Ccontinue Sinemet  Hypothyroidism Continue Synthroid  Type II diabetes mellitus with renal manifestations ([place]) Question if baseline stage II CKD (given diabetes) vs mild AKI.  Baseline renal function unclear.  Started on IV fluids on admission.  --Increase 70/30 insulin to 10 units BID --Adjust insulin as needed         Subjective: Pt seen awake sitting up in bed eating breakfast.  He denies significant pain as long as he's sitting still.  Eager to see how he does with therapy today.  No other acute complaints.     Physical Exam: Vitals:   12/16/21 0727 12/16/21 1143 12/16/21 1328 12/16/21 1519  BP: (!) 110/50 (!) 134/50 (!) 127/46 (!) 172/57  Pulse: (!) 58 (!) 52 (!) 55 60  Resp: 15 16  16   Temp: 97.8 F (36.6 C) 97.9 F (36.6 C)  98.1 F (36.7 C)  TempSrc:      SpO2: 94% 97%  96%  Weight:      Height:       General exam: awake, alert, no acute distress HEENT: atraumatic, clear conjunctiva, anicteric sclera, oist mucus membranes, hearing grossly normal  Respiratory system: CTAB, no wheezes, rales or rhonchi, normal respiratory effort. Cardiovascular system: normal S1/S2, RRR, no JVD, murmurs, rubs, gallops, no pedal edema.   Gastrointestinal system: soft, NT, ND, no HSM felt, +bowel sounds. Central nervous system: A&O x3. no gross focal neurologic deficits, normal speech Extremities: no edema, normal tone Skin: dry, intact, normal temperature Psychiatry: normal mood, flat affect, judgement and insight appear normal    Data Reviewed:  Notable labs -- sodium improved 132, Cr increased 1.42, Mg 1.6  Family Communication:  multiple family members at bedside 8/22.  Disposition: Status is: Inpatient Remains inpatient appropriate because: requires SNF/rehab.   Planned Discharge Destination: SNF     Time spent: 35 minutes  Author: [name], DO 12/16/2021 3:38 PM  For on call review [email or web address].

## 2021-12-16 NOTE — Progress Notes (Signed)
Subjective:  POD #1 s/p intramedullary fixation for right intertrochanteric hip fracture.   Patient reports right hip pain as mild at rest but increases with any movement of the right hip.  He is up out of bed to a chair.  His granddaughter and wife are at the bedside.  Objective:   VITALS:   Vitals:   12/16/21 0608 12/16/21 0727 12/16/21 1143 12/16/21 1328  BP: (!) 128/54 (!) 110/50 (!) 134/50 (!) 127/46  Pulse: (!) 57 (!) 58 (!) 52 (!) 55  Resp: 18 15 16    Temp: 98 F (36.7 C) 97.8 F (36.6 C) 97.9 F (36.6 C)   TempSrc: Oral     SpO2: 96% 94% 97%   Weight:      Height:        PHYSICAL EXAM: Right lower extremity Neurovascular intact Sensation intact distally Intact pulses distally Dorsiflexion/Plantar flexion intact Incision: scant drainage No cellulitis present Compartment soft  LABS  Results for orders placed or performed during the hospital encounter of 12/14/21 (from the past 24 hour(s))  Glucose, capillary     Status: Abnormal   Collection Time: 12/15/21  2:58 PM  Result Value Ref Range   Glucose-Capillary 169 (H) 70 - 99 mg/dL   Comment 1 Notify RN    Comment 2 Document in Chart   Glucose, capillary     Status: Abnormal   Collection Time: 12/15/21  3:52 PM  Result Value Ref Range   Glucose-Capillary 201 (H) 70 - 99 mg/dL  Glucose, capillary     Status: Abnormal   Collection Time: 12/15/21  5:32 PM  Result Value Ref Range   Glucose-Capillary 206 (H) 70 - 99 mg/dL  CBC     Status: Abnormal   Collection Time: 12/16/21  6:26 AM  Result Value Ref Range   WBC 8.5 4.0 - 10.5 K/uL   RBC 2.48 (L) 4.22 - 5.81 MIL/uL   Hemoglobin 7.6 (L) 13.0 - 17.0 g/dL   HCT 22.5 (L) 39.0 - 52.0 %   MCV 90.7 80.0 - 100.0 fL   MCH 30.6 26.0 - 34.0 pg   MCHC 33.8 30.0 - 36.0 g/dL   RDW 14.6 11.5 - 15.5 %   Platelets 109 (L) 150 - 400 K/uL   nRBC 0.0 0.0 - 0.2 %  Basic metabolic panel     Status: Abnormal   Collection Time: 12/16/21  6:26 AM  Result Value Ref Range    Sodium 132 (L) 135 - 145 mmol/L   Potassium 3.7 3.5 - 5.1 mmol/L   Chloride 102 98 - 111 mmol/L   CO2 23 22 - 32 mmol/L   Glucose, Bld 266 (H) 70 - 99 mg/dL   BUN 21 8 - 23 mg/dL   Creatinine, Ser 1.42 (H) 0.61 - 1.24 mg/dL   Calcium 7.6 (L) 8.9 - 10.3 mg/dL   GFR, Estimated 49 (L) >60 mL/min   Anion gap 7 5 - 15  Magnesium     Status: Abnormal   Collection Time: 12/16/21  6:26 AM  Result Value Ref Range   Magnesium 1.6 (L) 1.7 - 2.4 mg/dL  Glucose, capillary     Status: Abnormal   Collection Time: 12/16/21  8:13 AM  Result Value Ref Range   Glucose-Capillary 260 (H) 70 - 99 mg/dL  Glucose, capillary     Status: Abnormal   Collection Time: 12/16/21 11:39 AM  Result Value Ref Range   Glucose-Capillary 241 (H) 70 - 99 mg/dL    DG HIP  UNILAT W OR W/O PELVIS 1V RIGHT  Result Date: 12/15/2021 CLINICAL DATA:  Operative fixation of a right hip fracture. EXAM: DG HIP (WITH OR WITHOUT PELVIS) 1V RIGHT COMPARISON:  C-arm images obtained earlier today routine images dated 12/14/2021. FINDINGS: Interval intramedullary rod and compression screw fixation of the previously demonstrated intertrochanteric fracture with anatomic position and alignment. No additional fractures or dislocation seen. IMPRESSION: Operative fixation of the right hip fracture with anatomic position and alignment. Electronically Signed   By: Claudie Revering M.D.   On: 12/15/2021 17:31   DG HIP UNILAT WITH PELVIS 2-3 VIEWS RIGHT  Result Date: 12/15/2021 CLINICAL DATA:  Right femoral intramedullary nail. EXAM: DG HIP (WITH OR WITHOUT PELVIS) 2-3V RIGHT; DG C-ARM 1-60 MIN COMPARISON:  Right hip radiographs 12/14/2021 FLUOROSCOPY: The device does not provide the exposure index. Fluoroscopy Time:  1 minute 13 seconds. Number of Acquired Images:  5. FINDINGS: Intraoperative spot fluoroscopic images demonstrate ORIF of the previously shown intertrochanteric femur fracture with placement of an intramedullary nail and proximal and distal  interlocking screws. Alignment is grossly anatomic on these limited images. IMPRESSION: Intraoperative images during ORIF of right femur fracture. Electronically Signed   By: Logan Bores M.D.   On: 12/15/2021 14:55   DG C-Arm 1-60 Min  Result Date: 12/15/2021 CLINICAL DATA:  Right femoral intramedullary nail. EXAM: DG HIP (WITH OR WITHOUT PELVIS) 2-3V RIGHT; DG C-ARM 1-60 MIN COMPARISON:  Right hip radiographs 12/14/2021 FLUOROSCOPY: The device does not provide the exposure index. Fluoroscopy Time:  1 minute 13 seconds. Number of Acquired Images:  5. FINDINGS: Intraoperative spot fluoroscopic images demonstrate ORIF of the previously shown intertrochanteric femur fracture with placement of an intramedullary nail and proximal and distal interlocking screws. Alignment is grossly anatomic on these limited images. IMPRESSION: Intraoperative images during ORIF of right femur fracture. Electronically Signed   By: Logan Bores M.D.   On: 12/15/2021 14:55   DG Hip Unilat With Pelvis 2-3 Views Right  Result Date: 12/14/2021 CLINICAL DATA:  Fall and right hip pain. EXAM: DG HIP (WITH OR WITHOUT PELVIS) 2-3V RIGHT COMPARISON:  None Available. FINDINGS: Minimally displaced and comminuted appearing intertrochanteric fracture of the right femoral neck. No other acute fracture. There is no dislocation. The bones are osteopenic. Moderate arthritic changes of the hips. The soft tissues are unremarkable. IMPRESSION: Intertrochanteric fracture of the right femoral neck. Electronically Signed   By: Anner Crete M.D.   On: 12/14/2021 20:00    Assessment/Plan: 1 Day Post-Op   Principal Problem:   Closed right hip fracture (HCC) Active Problems:   Type II diabetes mellitus with renal manifestations (HCC)   Hypertensive urgency   Hypothyroidism   Parkinson disease ([place])   Dyslipidemia   Hypokalemia   Hyponatremia  Patient doing well postop.  He is up out of bed to a chair.  Continue physical therapy.  Patient  will require a skilled nursing facility upon discharge.  Will begin Lovenox today for DVT prophylaxis.  Patient's dressings may be changed as needed.  Continue with current pain management.  I have personally reviewed the patient's postoperative x-rays which demonstrate the fracture is in anatomic alignment.    Thornton Park , MD 12/16/2021, 1:52 PM

## 2021-12-16 NOTE — Assessment & Plan Note (Signed)
Replacing for Mg 1.6 today (8/23) Monitor Mg and replace PRN.

## 2021-12-17 DIAGNOSIS — E871 Hypo-osmolality and hyponatremia: Secondary | ICD-10-CM | POA: Diagnosis not present

## 2021-12-17 DIAGNOSIS — D62 Acute posthemorrhagic anemia: Secondary | ICD-10-CM | POA: Diagnosis not present

## 2021-12-17 DIAGNOSIS — S72001A Fracture of unspecified part of neck of right femur, initial encounter for closed fracture: Secondary | ICD-10-CM | POA: Diagnosis not present

## 2021-12-17 LAB — BASIC METABOLIC PANEL
Anion gap: 5 (ref 5–15)
BUN: 23 mg/dL (ref 8–23)
CO2: 24 mmol/L (ref 22–32)
Calcium: 7.7 mg/dL — ABNORMAL LOW (ref 8.9–10.3)
Chloride: 103 mmol/L (ref 98–111)
Creatinine, Ser: 1.24 mg/dL (ref 0.61–1.24)
GFR, Estimated: 58 mL/min — ABNORMAL LOW (ref >60)
Glucose, Bld: 176 mg/dL — ABNORMAL HIGH (ref 70–99)
Potassium: 3.4 mmol/L — ABNORMAL LOW (ref 3.5–5.1)
Sodium: 132 mmol/L — ABNORMAL LOW (ref 135–145)

## 2021-12-17 LAB — CBC
HCT: 19.1 % — ABNORMAL LOW (ref 39.0–52.0)
Hemoglobin: 6.4 g/dL — ABNORMAL LOW (ref 13.0–17.0)
MCH: 30.5 pg (ref 26.0–34.0)
MCHC: 33.5 g/dL (ref 30.0–36.0)
MCV: 91 fL (ref 80.0–100.0)
Platelets: 107 10*3/uL — ABNORMAL LOW (ref 150–400)
RBC: 2.1 MIL/uL — ABNORMAL LOW (ref 4.22–5.81)
RDW: 14.8 % (ref 11.5–15.5)
WBC: 8.2 10*3/uL (ref 4.0–10.5)
nRBC: 0 % (ref 0.0–0.2)

## 2021-12-17 LAB — GLUCOSE, CAPILLARY
Glucose-Capillary: 203 mg/dL — ABNORMAL HIGH (ref 70–99)
Glucose-Capillary: 214 mg/dL — ABNORMAL HIGH (ref 70–99)

## 2021-12-17 LAB — PREPARE RBC (CROSSMATCH)

## 2021-12-17 LAB — MAGNESIUM: Magnesium: 2.4 mg/dL (ref 1.7–2.4)

## 2021-12-17 LAB — IRON AND TIBC
Iron: 25 ug/dL — ABNORMAL LOW (ref 45–182)
Saturation Ratios: 13 % — ABNORMAL LOW (ref 17.9–39.5)
TIBC: 186 ug/dL — ABNORMAL LOW (ref 250–450)
UIBC: 161 ug/dL

## 2021-12-17 LAB — FERRITIN: Ferritin: 70 ng/mL (ref 24–336)

## 2021-12-17 LAB — HEMOGLOBIN AND HEMATOCRIT, BLOOD
HCT: 21.5 % — ABNORMAL LOW (ref 39.0–52.0)
Hemoglobin: 7.4 g/dL — ABNORMAL LOW (ref 13.0–17.0)

## 2021-12-17 LAB — ABO/RH: ABO/RH(D): O POS

## 2021-12-17 MED ORDER — SODIUM CHLORIDE 0.9% IV SOLUTION: Freq: Once | INTRAVENOUS | Status: AC

## 2021-12-17 MED ORDER — POTASSIUM CHLORIDE CRYS ER 20 MEQ PO TBCR
40.0000 meq | EXTENDED_RELEASE_TABLET | Freq: Once | ORAL | Status: AC
  Administered 2021-12-17: 40 meq via ORAL
  Filled 2021-12-17: qty 2

## 2021-12-17 NOTE — TOC Progression Note (Signed)
Transition of Care [facility]) - CM/SW Discharge Note   Patient Details  Name: Gilbert [name] Sr. MRN: [id number] Date of Birth: 1940-09-06  Transition of Care [facility]) CM/SW Contact:  Conception Oms, RN Phone Number: 12/17/2021, 4:28 PM   Clinical Narrative:        Met with the patient and his wife, they want to go to [facility] I notified Magda Paganini at [facility] She will review Bedsearch sent, FL2 completed, PASSR obtained     Patient Goals and CMS Choice        Discharge Placement                       Discharge Plan and Services                                     Social Determinants of Health (SDOH) Interventions     Readmission Risk Interventions     No data to display

## 2021-12-17 NOTE — Anesthesia Postprocedure Evaluation (Signed)
Anesthesia Post Note  Patient: Gilbert Nose Goodwyn Sr.  Procedure(s) Performed: INTRAMEDULLARY (IM) NAIL INTERTROCHANTERIC (Right: Hip)  Patient location during evaluation: PACU Anesthesia Type: General Level of consciousness: awake and alert Pain management: pain level controlled Vital Signs Assessment: post-procedure vital signs reviewed and stable Respiratory status: spontaneous breathing, nonlabored ventilation and respiratory function stable Cardiovascular status: blood pressure returned to baseline and stable Postop Assessment: no apparent nausea or vomiting Anesthetic complications: no   No notable events documented.   Last Vitals:  Vitals:   12/16/21 2051 12/17/21 0423  BP: (!) 131/47 (!) 129/53  Pulse: 62 (!) 59  Resp:  15  Temp:  (!) 36.3 C  SpO2: 95% 96%    Last Pain:  Vitals:   12/17/21 0513  TempSrc:   PainSc: 0-No pain                 Iran Ouch

## 2021-12-17 NOTE — Progress Notes (Signed)
OT Cancellation Note  Patient Details Name: Gilbert PERUSKI Sr. MRN: [id number] DOB: 05/18/1940   Cancelled Treatment:    Reason Eval/Treat Not Completed: Medical issues which prohibited therapy. Patient has Hgb 6.4, with transfusion pending. Will see pt at a later time/date, as pt is available and medically appropriate.  Josiah Lobo 12/17/2021, 12:19 PM

## 2021-12-17 NOTE — Plan of Care (Signed)

## 2021-12-17 NOTE — NC FL2 (Signed)
[place] LEVEL OF CARE SCREENING TOOL     IDENTIFICATION  Patient Name: Gilbert [name]. Birthdate: [birth year] Sex: male Admission Date (Current Location): 12/14/2021  [facility] and [place] Number:  Engineering geologist and Address:  [facility], [address] [place], [place] [address]      Provider Number: [id number]  Attending Physician Name and Address:  [name], DO  Relative Name and Phone Number:  Mora Appl Wife [phone]    Current Level of Care: Hospital Recommended Level of Care: [place] Prior Approval Number:    Date Approved/Denied:   PASRR Number: [id number]  Discharge Plan: SNF    Current Diagnoses: Patient Active Problem List   Diagnosis Date Noted   Hypomagnesemia 12/16/2021   ABLA (acute blood loss anemia) 12/16/2021   Hyponatremia 12/15/2021   Closed right hip fracture ([place]) 12/14/2021   Dyslipidemia 12/14/2021   Hypokalemia 12/14/2021   Generalized weakness    Acute kidney injury superimposed on CKD ([place]) 04/25/2021   Fall 04/20/2021   Right rib fracture 04/20/2021   Acute respiratory failure with hypoxia ([place]) 04/20/2021   Elevated troponin 04/20/2021   HTN (hypertension) 04/20/2021   HLD (hyperlipidemia) 04/20/2021   Hypothyroidism 04/20/2021   Chronic diastolic CHF (congestive heart failure) ([place]) 04/20/2021   Parkinson disease ([place])    CKD (chronic kidney disease), stage IIIa    Type II diabetes mellitus with renal manifestations ([place]) 06/10/2019   Abnormal findings on diagnostic imaging of gall bladder 06/10/2019   Obstructive jaundice 06/10/2019   Abdominal pain 06/10/2019   History of CVA (cerebrovascular accident) without residual deficits 06/10/2019   Hypertensive urgency 06/10/2019   Hypoglycemia associated with type 2 diabetes mellitus ([place]) 06/10/2019   Dehydration 12/10/2018   Seizure disorder ([place])    Expressive aphasia    CVA (cerebral vascular  accident) ([place]) 05/05/2016    Orientation RESPIRATION BLADDER Height & Weight     Self, Place, Situation  Normal Continent Weight: 75.8 kg Height:  5\' 10"  (177.8 cm)  BEHAVIORAL SYMPTOMS/MOOD NEUROLOGICAL BOWEL NUTRITION STATUS      Continent Diet (see dc summary)  AMBULATORY STATUS COMMUNICATION OF NEEDS Skin   Extensive Assist Verbally Normal, Surgical wounds                       Personal Care Assistance Level of Assistance  Bathing, Feeding, Dressing Bathing Assistance: Limited assistance Feeding assistance: Limited assistance Dressing Assistance: Maximum assistance     Functional Limitations Info             SPECIAL CARE FACTORS FREQUENCY  PT (By licensed PT), OT (By licensed OT)     PT Frequency: 5 times per week OT Frequency: 5 times per week            Contractures Contractures Info: Not present    Additional Factors Info  Code Status, Allergies Code Status Info: full code Allergies Info: Metformin           Current Medications (12/17/2021):  This is the current hospital active medication list Current Facility-Administered Medications  Medication Dose Route Frequency Provider Last Rate Last Admin   acetaminophen (TYLENOL) tablet 1,000 mg  1,000 mg Oral Q8H Griffith, Kelly A, DO   1,000 mg at 12/17/21 1313   alum & mag hydroxide-simeth (MAALOX/MYLANTA) 200-200-20 MG/5ML suspension 30 mL  30 mL Oral Q4H PRN Thornton Park, MD       amLODipine (NORVASC) tablet 2.5 mg  2.5  mg Oral Daily Thornton Park, MD   2.5 mg at 12/15/21 1612   ascorbic acid (VITAMIN C) tablet 1,000 mg  1,000 mg Oral Q breakfast Thornton Park, MD   1,000 mg at 12/17/21 0955   atenolol (TENORMIN) tablet 25 mg  25 mg Oral Daily Thornton Park, MD   25 mg at 12/15/21 1612   atorvastatin (LIPITOR) tablet 40 mg  40 mg Oral QHS Thornton Park, MD   40 mg at 12/16/21 2053   bisacodyl (DULCOLAX) suppository 10 mg  10 mg Rectal Daily PRN Thornton Park, MD        carbidopa-levodopa (SINEMET IR) 25-100 MG per tablet immediate release 2 tablet  2 tablet Oral TID WC & HS Thornton Park, MD   2 tablet at 12/17/21 1308   colchicine tablet 0.6 mg  0.6 mg Oral Daily PRN Thornton Park, MD       divalproex (DEPAKOTE ER) 24 hr tablet 500 mg  500 mg Oral BID Thornton Park, MD   500 mg at 12/17/21 0957   docusate sodium (COLACE) capsule 100 mg  100 mg Oral BID Thornton Park, MD   100 mg at 12/17/21 0956   feeding supplement (GLUCERNA SHAKE) (GLUCERNA SHAKE) liquid 237 mL  237 mL Oral TID BM Nicole Kindred A, DO   237 mL at 12/17/21 1313   hydrALAZINE (APRESOLINE) tablet 100 mg  100 mg Oral TID Thornton Park, MD   100 mg at 12/16/21 1654   insulin aspart protamine- aspart (NOVOLOG MIX 70/30) injection 10 Units  10 Units Subcutaneous BID WC Nicole Kindred A, DO   10 Units at 12/17/21 0957   levothyroxine (SYNTHROID) tablet 75 mcg  75 mcg Oral Q0600 Thornton Park, MD   75 mcg at 12/17/21 4287   magnesium hydroxide (MILK OF MAGNESIA) suspension 30 mL  30 mL Oral Daily PRN Thornton Park, MD   30 mL at 12/16/21 6811   megestrol (MEGACE) tablet 20 mg  20 mg Oral Daily Nicole Kindred A, DO   20 mg at 12/17/21 0957   menthol-cetylpyridinium (CEPACOL) lozenge 3 mg  1 lozenge Oral PRN Thornton Park, MD       Or   phenol (CHLORASEPTIC) mouth spray 1 spray  1 spray Mouth/Throat PRN Thornton Park, MD       methocarbamol (ROBAXIN) tablet 500 mg  500 mg Oral Q6H PRN Thornton Park, MD       Or   methocarbamol (ROBAXIN) 500 mg in dextrose 5 % 50 mL IVPB  500 mg Intravenous Q6H PRN Thornton Park, MD       multivitamin with minerals tablet 1 tablet  1 tablet Oral Daily Nicole Kindred A, DO   1 tablet at 12/17/21 0956   ondansetron (ZOFRAN) tablet 4 mg  4 mg Oral Q6H PRN Thornton Park, MD       Or   ondansetron [facility]) injection 4 mg  4 mg Intravenous Q6H PRN Thornton Park, MD       oxyCODONE (Oxy IR/ROXICODONE) immediate release tablet 5 mg  5  mg Oral Q6H PRN Nicole Kindred A, DO       polyethylene glycol (MIRALAX / GLYCOLAX) packet 17 g  17 g Oral Daily PRN Thornton Park, MD       rivastigmine (EXELON) capsule 1.5 mg  1.5 mg Oral BID Thornton Park, MD   1.5 mg at 12/17/21 0957   senna (SENOKOT) tablet 8.6 mg  1 tablet Oral BID Thornton Park, MD   8.6 mg at 12/17/21  7308   traMADol (ULTRAM) tablet 50 mg  50 mg Oral Q6H Thornton Park, MD   50 mg at 12/17/21 1308   traZODone (DESYREL) tablet 25 mg  25 mg Oral QHS PRN Thornton Park, MD         Discharge Medications: Please see discharge summary for a list of discharge medications.  Relevant Imaging Results:  Relevant Lab Results:   Additional Information SS #: Bent, RN

## 2021-12-17 NOTE — Progress Notes (Signed)
PT Cancellation Note  Patient Details Name: Gilbert [name]. MRN: [id number] DOB: August 16, 1940   Cancelled Treatment:    Reason Eval/Treat Not Completed: Medical issues which prohibited therapy. Patient has Hgb 6.4, planning for transfusion this am. Will see patient this pm as appropriate.     Meekah Math 12/17/2021, 11:17 AM

## 2021-12-17 NOTE — Progress Notes (Signed)
Subjective:  POD #3 s/p IM fixation for right IT hip fracture.   [name] is in the room.  Patient reports left hip pain as mild at rest.  Hgb this AM was 6.4 and he has received a unit of PRBC.    Objective:   VITALS:   Vitals:   12/17/21 0828 12/17/21 0841 12/17/21 0843 12/17/21 1118  BP: (!) 128/52 (!) 128/47 (!) 128/47 (!) 122/50  Pulse: 68 66 67 70  Resp: 18 16 16 18   Temp: 98.5 F (36.9 C) 98.3 F (36.8 C) 98.3 F (36.8 C) 98.3 F (36.8 C)  TempSrc: Oral Oral Oral Oral  SpO2:  94% 95% 94%  Weight:      Height:        PHYSICAL EXAM: Right lower extremity: Patient has mild serosanguinous drainage on his honeycomb dressing.  No signof active bleeding, drainage or infection.  His thigh compartments are soft and compressible. Neurovascular intact Sensation intact distally Intact pulses distally Dorsiflexion/Plantar flexion intact Incision: no drainage No cellulitis present Compartment soft  LABS  Results for orders placed or performed during the hospital encounter of 12/14/21 (from the past 24 hour(s))  Hemoglobin and hematocrit, blood     Status: Abnormal   Collection Time: 12/16/21  3:08 PM  Result Value Ref Range   Hemoglobin 7.8 (L) 13.0 - 17.0 g/dL   HCT 23.6 (L) 39.0 - 52.0 %  Glucose, capillary     Status: Abnormal   Collection Time: 12/16/21  3:16 PM  Result Value Ref Range   Glucose-Capillary 283 (H) 70 - 99 mg/dL  Glucose, capillary     Status: Abnormal   Collection Time: 12/16/21  9:55 PM  Result Value Ref Range   Glucose-Capillary 265 (H) 70 - 99 mg/dL  CBC     Status: Abnormal   Collection Time: 12/17/21  5:13 AM  Result Value Ref Range   WBC 8.2 4.0 - 10.5 K/uL   RBC 2.10 (L) 4.22 - 5.81 MIL/uL   Hemoglobin 6.4 (L) 13.0 - 17.0 g/dL   HCT 19.1 (L) 39.0 - 52.0 %   MCV 91.0 80.0 - 100.0 fL   MCH 30.5 26.0 - 34.0 pg   MCHC 33.5 30.0 - 36.0 g/dL   RDW 14.8 11.5 - 15.5 %   Platelets 107 (L) 150 - 400 K/uL   nRBC 0.0 0.0 - 0.2 %  Basic  metabolic panel     Status: Abnormal   Collection Time: 12/17/21  5:13 AM  Result Value Ref Range   Sodium 132 (L) 135 - 145 mmol/L   Potassium 3.4 (L) 3.5 - 5.1 mmol/L   Chloride 103 98 - 111 mmol/L   CO2 24 22 - 32 mmol/L   Glucose, Bld 176 (H) 70 - 99 mg/dL   BUN 23 8 - 23 mg/dL   Creatinine, Ser 1.24 0.61 - 1.24 mg/dL   Calcium 7.7 (L) 8.9 - 10.3 mg/dL   GFR, Estimated 58 (L) >60 mL/min   Anion gap 5 5 - 15  Magnesium     Status: None   Collection Time: 12/17/21  5:13 AM  Result Value Ref Range   Magnesium 2.4 1.7 - 2.4 mg/dL  Iron and TIBC     Status: Abnormal   Collection Time: 12/17/21  5:13 AM  Result Value Ref Range   Iron 25 (L) 45 - 182 ug/dL   TIBC 186 (L) 250 - 450 ug/dL   Saturation Ratios 13 (L) 17.9 - 39.5 %  UIBC 161 ug/dL  Ferritin     Status: None   Collection Time: 12/17/21  5:13 AM  Result Value Ref Range   Ferritin 70 24 - 336 ng/mL  ABO/Rh     Status: None   Collection Time: 12/17/21  5:13 AM  Result Value Ref Range   ABO/RH(D)      O POS Performed at [facility], [address] [place], [place] [address]   Prepare RBC (crossmatch)     Status: None   Collection Time: 12/17/21  7:23 AM  Result Value Ref Range   Order Confirmation      ORDER PROCESSED BY BLOOD BANK Performed at [facility], [address] [place], [place] [address]   Glucose, capillary     Status: Abnormal   Collection Time: 12/17/21  9:55 AM  Result Value Ref Range   Glucose-Capillary 203 (H) 70 - 99 mg/dL  Hemoglobin and hematocrit, blood     Status: Abnormal   Collection Time: 12/17/21 12:29 PM  Result Value Ref Range   Hemoglobin 7.4 (L) 13.0 - 17.0 g/dL   HCT 21.5 (L) 39.0 - 52.0 %    DG HIP UNILAT W OR W/O PELVIS 1V RIGHT  Result Date: 12/15/2021 CLINICAL DATA:  Operative fixation of a right hip fracture. EXAM: DG HIP (WITH OR WITHOUT PELVIS) 1V RIGHT COMPARISON:  C-arm images obtained earlier today routine images dated 12/14/2021. FINDINGS:  Interval intramedullary rod and compression screw fixation of the previously demonstrated intertrochanteric fracture with anatomic position and alignment. No additional fractures or dislocation seen. IMPRESSION: Operative fixation of the right hip fracture with anatomic position and alignment. Electronically Signed   By: Claudie Revering M.D.   On: 12/15/2021 17:31   DG HIP UNILAT WITH PELVIS 2-3 VIEWS RIGHT  Result Date: 12/15/2021 CLINICAL DATA:  Right femoral intramedullary nail. EXAM: DG HIP (WITH OR WITHOUT PELVIS) 2-3V RIGHT; DG C-ARM 1-60 MIN COMPARISON:  Right hip radiographs 12/14/2021 FLUOROSCOPY: The device does not provide the exposure index. Fluoroscopy Time:  1 minute 13 seconds. Number of Acquired Images:  5. FINDINGS: Intraoperative spot fluoroscopic images demonstrate ORIF of the previously shown intertrochanteric femur fracture with placement of an intramedullary nail and proximal and distal interlocking screws. Alignment is grossly anatomic on these limited images. IMPRESSION: Intraoperative images during ORIF of right femur fracture. Electronically Signed   By: Logan Bores M.D.   On: 12/15/2021 14:55   DG C-Arm 1-60 Min  Result Date: 12/15/2021 CLINICAL DATA:  Right femoral intramedullary nail. EXAM: DG HIP (WITH OR WITHOUT PELVIS) 2-3V RIGHT; DG C-ARM 1-60 MIN COMPARISON:  Right hip radiographs 12/14/2021 FLUOROSCOPY: The device does not provide the exposure index. Fluoroscopy Time:  1 minute 13 seconds. Number of Acquired Images:  5. FINDINGS: Intraoperative spot fluoroscopic images demonstrate ORIF of the previously shown intertrochanteric femur fracture with placement of an intramedullary nail and proximal and distal interlocking screws. Alignment is grossly anatomic on these limited images. IMPRESSION: Intraoperative images during ORIF of right femur fracture. Electronically Signed   By: Logan Bores M.D.   On: 12/15/2021 14:55    Assessment/Plan: 2 Days Post-Op   Principal  Problem:   Closed right hip fracture (HCC) Active Problems:   Type II diabetes mellitus with renal manifestations (HCC)   Hypertensive urgency   Hypothyroidism   Parkinson disease ([place])   CKD (chronic kidney disease), stage IIIa   Acute kidney injury superimposed on CKD (HCC)   Dyslipidemia   Hypokalemia   Hyponatremia  Hypomagnesemia   ABLA (acute blood loss anemia)  Continue PT/OT.  Hold lovenox until hemoglobin improves.  Will discuss use of aspirin with hospitalist.  Recheck Hgb after transfusion.   Thornton Park , MD 12/17/2021, 2:08 PM

## 2021-12-17 NOTE — Progress Notes (Addendum)
Progress Note   Patient: Gilbert RITTENBERRY Sr. ERD:[id number] DOB: 08/30/1940 DOA: 12/14/2021     3 DOS: the patient was seen and examined on 12/17/2021   Brief hospital course: "81 y.o. Caucasian male with medical history significant for type 2 diabetes mellitus, essential hypertension, Parkinson's disease and stroke, who presented to the emergency room with acute onset of accidental mechanical fall with subsequent right hip pain."  Right hip x-ray showed intertrochanteric fracture of the right femoral neck.  Orthopedic surgery was consulted.  Patient underwent ORIF with intramedullary nail on 8/22.    Assessment and Plan: * Closed right hip fracture [facility]) Orthopedic surgery following. Underwent ORIF with intramedullary nailing on 8/22. PT/OT evaluations. Pain control, bowel regimen. DVT prophylaxis. Follow Ortho's recommendations.   ABLA (acute blood loss anemia) Pre-op Hbg was 10.2.  On POD-1 Hbg down to 7.6, and this AM down further to 6.4. --transfuse 1 unit pRBC's --check post-transfusion H&H --transfuse if Hbg < 7.0 --monitor surgical site for signs of bleeding  Hypomagnesemia Replaced for Mg 1.6 on 8/23 Monitor Mg and replace PRN.  Hyponatremia Na 129 on admission.  Family report poor PO intake recently.  Improved to 132 with IV fluids. --Stop IV fluids  --Monitor BMP --Encourage PO intake and hydration  Hypokalemia K replaced on admission. Monitor and replace PRN  Acute kidney injury superimposed on CKD ([place]) Cr on admission was at baseline 1.21, improved to 1.03 >> 1.43 >> 1.24.  Has been on IV fluids. Possible he had transient hypotension during surgery that would explain the transient increase on 8/23.  This is likely baseline renal function. --Monitor urine output --Bladder scan PRN to monitor for retention --Stop IV fluids and monitor   CKD (chronic kidney disease), stage IIIa Baseline Cr appears ~1.1-1.2. Had very mild AKI in post-op setting which  resolved. Monitor BMP.  Hypertensive urgency Continue home meds. PRN IV hydralazine  Pain control  Dyslipidemia Continue statin   Parkinson disease ([place]) Ccontinue Sinemet  Hypothyroidism Continue Synthroid  Type II diabetes mellitus with renal manifestations (HCC) With hyperglycemia --Increase 70/30 insulin to 10 units BID --Adjust insulin as needed         Subjective: Pt awake sitting up in bed visiting with wife at bedside when seen on rounds this morning.  He is agreeable to blood transfusion.  He and wife concerned about the drop in hemoglobin and whether or not he is actively bleeding we discussed the drop from surgery and some drop afterwards can be expected even without ongoing bleeding.  He is agreeable to iron infusion if needed as well.  No other acute complaints at this time and pain is overall well controlled.    Physical Exam: Vitals:   12/17/21 0828 12/17/21 0841 12/17/21 0843 12/17/21 1118  BP: (!) 128/52 (!) 128/47 (!) 128/47 (!) 122/50  Pulse: 68 66 67 70  Resp: 18 16 16 18   Temp: 98.5 F (36.9 C) 98.3 F (36.8 C) 98.3 F (36.8 C) 98.3 F (36.8 C)  TempSrc: Oral Oral Oral Oral  SpO2:  94% 95% 94%  Weight:      Height:       General exam: awake, alert, no acute distress HEENT: Pale conjunctiva, pale mucous membranes, hearing grossly normal  Respiratory system: CTAB, no wheezes, rales or rhonchi, normal respiratory effort. Cardiovascular system: normal S1/S2, RRR, no JVD, murmurs, rubs, gallops, no pedal edema.   Central nervous system: A&O x3. no gross focal neurologic deficits, normal speech Extremities: Honeycomb dressing on right  lateral hip with minimal serosanguineous drainage seen and no surrounding swelling erythema or warmth, no edema, normal tone Skin: Skin appears pale, dry, intact, normal temperature Psychiatry: normal mood, flat affect, judgement and insight appear normal    Data Reviewed:  Notable labs --hemoglobin dropped to  6.4, improved to 7.4 posttransfusion this morning.  BMP notable for sodium 132, potassium 3.4, glucose 176, creatinine normalized 1.24 down from 1.42, calcium 7.7 and GFR 58.  Iron panel notable for low iron 25, low TIBC 186, low saturation ratio 13%, normal ferritin 70  Family Communication: Wife at bedside on rounds this morning  Disposition: Status is: Inpatient Remains inpatient appropriate because: requires SNF/rehab.   Planned Discharge Destination: SNF     Time spent: 35 minutes  Author: [name], DO 12/17/2021 4:03 PM  For on call review [email or web address].

## 2021-12-18 DIAGNOSIS — D62 Acute posthemorrhagic anemia: Secondary | ICD-10-CM | POA: Diagnosis not present

## 2021-12-18 DIAGNOSIS — S72001A Fracture of unspecified part of neck of right femur, initial encounter for closed fracture: Secondary | ICD-10-CM | POA: Diagnosis not present

## 2021-12-18 DIAGNOSIS — E871 Hypo-osmolality and hyponatremia: Secondary | ICD-10-CM | POA: Diagnosis not present

## 2021-12-18 LAB — BASIC METABOLIC PANEL
Anion gap: 6 (ref 5–15)
BUN: 19 mg/dL (ref 8–23)
CO2: 24 mmol/L (ref 22–32)
Calcium: 7.5 mg/dL — ABNORMAL LOW (ref 8.9–10.3)
Chloride: 102 mmol/L (ref 98–111)
Creatinine, Ser: 1 mg/dL (ref 0.61–1.24)
GFR, Estimated: >60 mL/min (ref >60)
Glucose, Bld: 175 mg/dL — ABNORMAL HIGH (ref 70–99)
Potassium: 3.8 mmol/L (ref 3.5–5.1)
Sodium: 132 mmol/L — ABNORMAL LOW (ref 135–145)

## 2021-12-18 LAB — GLUCOSE, CAPILLARY
Glucose-Capillary: 163 mg/dL — ABNORMAL HIGH (ref 70–99)
Glucose-Capillary: 143 mg/dL — ABNORMAL HIGH (ref 70–99)
Glucose-Capillary: 202 mg/dL — ABNORMAL HIGH (ref 70–99)

## 2021-12-18 LAB — CBC
HCT: 21.5 % — ABNORMAL LOW (ref 39.0–52.0)
Hemoglobin: 7.3 g/dL — ABNORMAL LOW (ref 13.0–17.0)
MCH: 30.3 pg (ref 26.0–34.0)
MCHC: 34 g/dL (ref 30.0–36.0)
MCV: 89.2 fL (ref 80.0–100.0)
Platelets: 106 10*3/uL — ABNORMAL LOW (ref 150–400)
RBC: 2.41 MIL/uL — ABNORMAL LOW (ref 4.22–5.81)
RDW: 15.7 % — ABNORMAL HIGH (ref 11.5–15.5)
WBC: 8 10*3/uL (ref 4.0–10.5)
nRBC: 0 % (ref 0.0–0.2)

## 2021-12-18 LAB — HEMOGLOBIN AND HEMATOCRIT, BLOOD
HCT: 24.2 % — ABNORMAL LOW (ref 39.0–52.0)
Hemoglobin: 8.1 g/dL — ABNORMAL LOW (ref 13.0–17.0)

## 2021-12-18 LAB — BPAM RBC
Blood Product Expiration Date: 202309292359
ISSUE DATE / TIME: 202308240811
Unit Type and Rh: 5100

## 2021-12-18 LAB — TYPE AND SCREEN
ABO/RH(D): O POS
Antibody Screen: NEGATIVE
Unit division: 0

## 2021-12-18 LAB — MAGNESIUM: Magnesium: 1.9 mg/dL (ref 1.7–2.4)

## 2021-12-18 MED ORDER — ASPIRIN 81 MG PO TBEC
81.0000 mg | DELAYED_RELEASE_TABLET | Freq: Every day | ORAL | Status: DC
  Administered 2021-12-18 – 2021-12-21 (×4): 81 mg via ORAL
  Filled 2021-12-18 (×5): qty 1

## 2021-12-18 MED ORDER — OXYCODONE HCL 5 MG PO TABS
5.0000 mg | ORAL_TABLET | ORAL | Status: DC | PRN
  Administered 2021-12-18 – 2021-12-20 (×4): 5 mg via ORAL
  Filled 2021-12-18 (×4): qty 1

## 2021-12-18 MED ORDER — IRON SUCROSE 20 MG/ML IV SOLN
300.0000 mg | Freq: Once | INTRAVENOUS | Status: AC
  Administered 2021-12-18: 300 mg via INTRAVENOUS
  Filled 2021-12-18: qty 300

## 2021-12-18 NOTE — TOC Progression Note (Signed)
Transition of Care (TOC) - Progression Note    Patient Details  Name: Gilbert POOLEY Sr. MRN: [id number] Date of Birth: 12/02/1940  Transition of Care [facility]) CM/SW [place], RN Phone Number: 12/18/2021, 11:48 AM  Clinical Narrative:     Tammy with [facility]TA called with Ins approval to go to [place] number [phone], EMS approved for [place] number [phone]        Expected Discharge Plan and Services                                                 Social Determinants of Health (SDOH) Interventions    Readmission Risk Interventions     No data to display

## 2021-12-18 NOTE — Progress Notes (Signed)
Progress Note   Patient: Gilbert [name]. [id number]:[id number] DOB: 1940/07/11 DOA: 12/14/2021     4 DOS: the patient was seen and examined on 12/18/2021   Brief hospital course: "81 y.o. Caucasian male with medical history significant for type 2 diabetes mellitus, essential hypertension, Parkinson's disease and stroke, who presented to the emergency room with acute onset of accidental mechanical fall with subsequent right hip pain."  Right hip x-ray showed intertrochanteric fracture of the right femoral neck.  Orthopedic surgery was consulted.  Patient underwent ORIF with intramedullary nail on 8/22.    Assessment and Plan: * Closed right hip fracture [facility]) Orthopedic surgery following. Underwent ORIF with intramedullary nailing on 8/22. PT/OT evaluations. Pain control, bowel regimen. DVT prophylaxis - SCD's due to anemia. Once Hbg stable, 81 mg ASA for DVT ppx. Follow Ortho's recommendations.   ABLA (acute blood loss anemia) Pre-op Hbg was 10.2.  On POD-1 Hbg down to 7.6, then down to 6.4 on POD2.   Transfused 1 unit pRBC's on 8/24. Hbg today is 7.4 >> 7.3 --check afternoon H&H -- transfuse if Hbg < 7.0 --monitor surgical site for signs of bleeding --hold DVT chemoprophylaxis for now  Hypomagnesemia Replaced for Mg 1.6 on 8/23 Monitor Mg and replace PRN.  Hyponatremia Na 129 on admission.  Family report poor PO intake recently.  Improved to 132 with IV fluids. --Stop IV fluids  --Monitor BMP --Encourage PO intake and hydration  Hypokalemia K replaced on admission. Monitor and replace PRN  Acute kidney injury superimposed on CKD ([place]) Cr on admission was at baseline 1.21, improved to 1.03 >> 1.43 >> 1.24 >> 1.00.  Treated initially with IV fluids.  Possible Cr increase was from transient hypotension during surgery.   --Monitor urine output --Bladder scan PRN to monitor for retention --Off IV fluids --Monitor BMP  CKD (chronic kidney disease), stage IIIa Baseline  Cr appears ~1.1-1.2. Had very mild AKI in post-op setting which resolved. Monitor BMP.  Hypertensive urgency Continue home meds. PRN IV hydralazine  Pain control  Dyslipidemia Continue statin   Parkinson disease ([place]) Ccontinue Sinemet  Hypothyroidism Continue Synthroid  Type II diabetes mellitus with renal manifestations (HCC) With hyperglycemia --Increase 70/30 insulin to 10 units BID --Adjust insulin as needed         Subjective: Pt awake sitting up in bed when seen this AM.  Reports pain okay when sitting still, but gets quite severe with attempts at mobility.  Was not really able to stand with PT.  No other acute complaints.     Physical Exam: Vitals:   12/17/21 1638 12/17/21 2126 12/18/21 0506 12/18/21 0727  BP: (!) 155/56 (!) 154/60 (!) 148/65 (!) 133/58  Pulse: 71 72 74 73  Resp: 16 16 18    Temp: 100 F (37.8 C) 98.5 F (36.9 C) 98.3 F (36.8 C) 97.9 F (36.6 C)  TempSrc:  Oral Oral   SpO2: 97% 96% 96% 96%  Weight:      Height:       General exam: awake, alert, no acute distress HEENT: moist mucous membranes, hearing grossly normal  Respiratory system: CTAB, no wheezes, rales or rhonchi, normal respiratory effort. Cardiovascular system: normal S1/S2, RRR, no JVD, murmurs, rubs, gallops, no pedal edema.   Central nervous system: A&O x3. no gross focal neurologic deficits, normal speech Extremities: Honeycomb dressing on right lateral hip with minimal serosanguineous drainage seen and no surrounding swelling erythema or warmth, no edema, normal tone Skin: Skin appears pale, dry, intact, normal temperature  Psychiatry: normal mood, flat affect, judgement and insight appear normal    Data Reviewed:  Notable labs -- Hbg 7.3 from 7.4 after transfusion yesterday (stable).  Platelets 106k, Cr normalized, Na stable 132  Family Communication: Wife at bedside on rounds 8/24  Disposition: Status is: Inpatient Remains inpatient appropriate because:  requires SNF/rehab.  Monitoring anemia, will be medically stable for d/c if Hbg stable on 8/26.   Planned Discharge Destination: SNF     Time spent: 35 minutes  Author: [name], DO 12/18/2021 4:06 PM  For on call review [email or web address].

## 2021-12-18 NOTE — Progress Notes (Signed)
Subjective:  POD #3 s/p IM nail for right IT hip fracture.   Patient reports right hip pain as minimal at baseline and moderate with motion and PT.  His wife is at the bedside.  Hemoglobin has improved after transfusion of PRBC.  Objective:   VITALS:   Vitals:   12/17/21 1638 12/17/21 2126 12/18/21 0506 12/18/21 0727  BP: (!) 155/56 (!) 154/60 (!) 148/65 (!) 133/58  Pulse: 71 72 74 73  Resp: 16 16 18    Temp: 100 F (37.8 C) 98.5 F (36.9 C) 98.3 F (36.8 C) 97.9 F (36.6 C)  TempSrc:  Oral Oral   SpO2: 97% 96% 96% 96%  Weight:      Height:        PHYSICAL EXAM: Right lower extremity Neurovascular intact Sensation intact distally Intact pulses distally Dorsiflexion/Plantar flexion intact Mild sanguinous drainage on the superiormost aspect of the honeycomb dressing.  No evidence of active bleeding.  Compartments of the right thigh are soft and compressible. No cellulitis present Compartment soft   LABS  Results for orders placed or performed during the hospital encounter of 12/14/21 (from the past 24 hour(s))  Glucose, capillary     Status: Abnormal   Collection Time: 12/17/21  4:39 PM  Result Value Ref Range   Glucose-Capillary 214 (H) 70 - 99 mg/dL  CBC     Status: Abnormal   Collection Time: 12/18/21  3:51 AM  Result Value Ref Range   WBC 8.0 4.0 - 10.5 K/uL   RBC 2.41 (L) 4.22 - 5.81 MIL/uL   Hemoglobin 7.3 (L) 13.0 - 17.0 g/dL   HCT 21.5 (L) 39.0 - 52.0 %   MCV 89.2 80.0 - 100.0 fL   MCH 30.3 26.0 - 34.0 pg   MCHC 34.0 30.0 - 36.0 g/dL   RDW 15.7 (H) 11.5 - 15.5 %   Platelets 106 (L) 150 - 400 K/uL   nRBC 0.0 0.0 - 0.2 %  Basic metabolic panel     Status: Abnormal   Collection Time: 12/18/21  3:51 AM  Result Value Ref Range   Sodium 132 (L) 135 - 145 mmol/L   Potassium 3.8 3.5 - 5.1 mmol/L   Chloride 102 98 - 111 mmol/L   CO2 24 22 - 32 mmol/L   Glucose, Bld 175 (H) 70 - 99 mg/dL   BUN 19 8 - 23 mg/dL   Creatinine, Ser 1.00 0.61 - 1.24 mg/dL    Calcium 7.5 (L) 8.9 - 10.3 mg/dL   GFR, Estimated >60 >60 mL/min   Anion gap 6 5 - 15  Magnesium     Status: None   Collection Time: 12/18/21  3:51 AM  Result Value Ref Range   Magnesium 1.9 1.7 - 2.4 mg/dL  Glucose, capillary     Status: Abnormal   Collection Time: 12/18/21  7:28 AM  Result Value Ref Range   Glucose-Capillary 163 (H) 70 - 99 mg/dL  Glucose, capillary     Status: Abnormal   Collection Time: 12/18/21 11:51 AM  Result Value Ref Range   Glucose-Capillary 143 (H) 70 - 99 mg/dL    No results found.  Assessment/Plan: 3 Days Post-Op   Principal Problem:   Closed right hip fracture (HCC) Active Problems:   Type II diabetes mellitus with renal manifestations (HCC)   Hypertensive urgency   Hypothyroidism   Parkinson disease (HCC)   CKD (chronic kidney disease), stage IIIa   Acute kidney injury superimposed on CKD (HCC)   Dyslipidemia  Hypokalemia   Hyponatremia   Hypomagnesemia   ABLA (acute blood loss anemia)  Patient stable from an orthopedic standpoint.  Hemoglobin currently stable.  Vital signs stable.  Continue physical and Occupational Therapy.  Patient has been accepted by [facility] but transfer will likely not occur until Monday.  Anticoagulation therapy on hold until hemoglobin demonstrates consistent stability.    Thornton Park , MD 12/18/2021, 2:16 PM

## 2021-12-18 NOTE — TOC Progression Note (Signed)
Transition of Care (TOC) - Progression Note    Patient Details  Name: Gilbert GOHMAN Sr. MRN: [id number] Date of Birth: 11/01/1940  Transition of Care [facility]) CM/SW [place], RN Phone Number: 12/18/2021, 8:20 AM  Clinical Narrative:     [facility] [name] accepted the patient and offered a bed, Patient accepted, Called [facility] And requested Ins auth and EMS auth        Expected Discharge Plan and Services                                                 Social Determinants of Health (SDOH) Interventions    Readmission Risk Interventions     No data to display

## 2021-12-18 NOTE — Plan of Care (Signed)
  Problem: Activity: Goal: Risk for activity intolerance will decrease Outcome: Progressing   Problem: Nutrition: Goal: Adequate nutrition will be maintained Outcome: Progressing   Problem: Elimination: Goal: Will not experience complications related to bowel motility Outcome: Progressing Goal: Will not experience complications related to urinary retention Outcome: Progressing   Problem: Pain Managment: Goal: General experience of comfort will improve Outcome: Progressing   Problem: Skin Integrity: Goal: Risk for impaired skin integrity will decrease Outcome: Progressing

## 2021-12-18 NOTE — Progress Notes (Signed)
Occupational Therapy Treatment Patient Details Name: Gilbert SHINAULT Sr. MRN: [id number] DOB: 1941/02/13 Today's Date: 12/18/2021   History of present illness Gilbert KASSIS Sr. is a 81 y.o. Caucasian male with medical history significant for type 2 diabetes mellitus, essential hypertension, Parkinson's disease and stroke, who presented to the emergency room with acute onset of accidental mechanical fall with subsequent right hip pain.  The patient was getting out of a car and lost his balance with subsequent fall on his right side.  He denied any headache or dizziness or blurred vision. S/P R hip ORIF.   OT comments  Pt seen for OT tx following PT session. Pt seated EOB, endorsing no pain at rest but significant pain with any movement and weight bearing. Pt instructed in sequencing for bed mobility and lateral scoots. Pt required MAX A for lateral scoot attempts but ultimately too pain limited to complete effectively. Attempted return to bed with MAX A +1 but pt ultimately too pain limited and fearful of pain, requiring MAX A +2. Pt progressing slowly towards goals, primarily due to pain.    Recommendations for follow up therapy are one component of a multi-disciplinary discharge planning process, led by the attending physician.  Recommendations may be updated based on patient status, additional functional criteria and insurance authorization.    Follow Up Recommendations  Skilled nursing-short term rehab (<3 hours/day)    Assistance Recommended at Discharge    Patient can return home with the following  A lot of help with walking and/or transfers;A lot of help with bathing/dressing/bathroom;Assistance with cooking/housework;Assist for transportation;Help with stairs or ramp for entrance   Equipment Recommendations  Other (comment) (defer to next venue)    Recommendations for Other Services      Precautions / Restrictions Precautions Precautions: Fall Restrictions Weight Bearing  Restrictions: Yes RLE Weight Bearing: Weight bearing as tolerated       Mobility Bed Mobility Overal bed mobility: Needs Assistance Bed Mobility: Sit to Supine       Sit to supine: Max assist, +2 for physical assistance   General bed mobility comments: very pain limited    Transfers Overall transfer level: Needs assistance Equipment used: None Transfers: Bed to chair/wheelchair/BSC            Lateral/Scoot Transfers: Max assist       Balance Overall balance assessment: Needs assistance, History of Falls   Sitting balance-Leahy Scale: Good                                     ADL either performed or assessed with clinical judgement   ADL                                              Extremity/Trunk Assessment              Vision       Perception     Praxis      Cognition Arousal/Alertness: Awake/alert Behavior During Therapy: WFL for tasks assessed/performed Overall Cognitive Status: Within Functional Limits for tasks assessed  Exercises      Shoulder Instructions       General Comments      Pertinent Vitals/ Pain       Pain Assessment Pain Assessment: 0-10 Pain Score: 10-Worst pain ever Pain Location: R LE Pain Descriptors / Indicators: Discomfort, Moaning, Guarding, Grimacing, Sore, Aching Pain Intervention(s): Limited activity within patient's tolerance, Monitored during session, Repositioned, Premedicated before session  Home Living                                          Prior Functioning/Environment              Frequency  Min 2X/week        Progress Toward Goals  OT Goals(current goals can now be found in the care plan section)  Progress towards OT goals: Progressing toward goals  Acute Rehab OT Goals Patient Stated Goal: to walk again OT Goal Formulation: With patient Time For Goal Achievement:  12/30/21 Potential to Achieve Goals: Good  Plan Discharge plan remains appropriate;Frequency remains appropriate    Co-evaluation                 AM-PAC OT "6 Clicks" Daily Activity     Outcome Measure   Help from another person eating meals?: None Help from another person taking care of personal grooming?: A Little Help from another person toileting, which includes using toliet, bedpan, or urinal?: A Lot Help from another person bathing (including washing, rinsing, drying)?: A Lot Help from another person to put on and taking off regular upper body clothing?: A Little Help from another person to put on and taking off regular lower body clothing?: A Lot 6 Click Score: 16    End of Session    OT Visit Diagnosis: Unsteadiness on feet (R26.81);Muscle weakness (generalized) (M62.81);History of falling (Z91.81);Pain Pain - Right/Left: Right Pain - part of body: Hip;Leg   Activity Tolerance Patient limited by pain   Patient Left in bed;with call bell/phone within reach;with bed alarm set   Nurse Communication          Time: 1610-9604 OT Time Calculation (min): 16 min  Charges: OT General Charges $OT Visit: 1 Visit OT Treatments $Self Care/Home Management : 8-22 mins  Ardeth Perfect., MPH, MS, OTR/L ascom [phone] 12/18/21, 12:36 PM

## 2021-12-18 NOTE — Progress Notes (Signed)
Physical Therapy Treatment Patient Details Name: Gilbert WINSETT Sr. MRN: [id number] DOB: 1941/02/03 Today's Date: 12/18/2021   History of Present Illness Gilbert JARBOE Sr. is a 81 y.o. Caucasian male with medical history significant for type 2 diabetes mellitus, essential hypertension, Parkinson's disease and stroke, who presented to the emergency room with acute onset of accidental mechanical fall with subsequent right hip pain.  The patient was getting out of a car and lost his balance with subsequent fall on his right side.  He denied any headache or dizziness or blurred vision. S/P R hip ORIF.    PT Comments    Pt required motivation and encouragement to participate fully during the session despite being pre-medicated.  Pt required significant physical assistance with all functional tasks and once in standing was unable to advance either LE despite considerable effort and multiple attempts.  Max standing time during multiple standing episodes was around 15-20 sec.  Pt will benefit from PT services in a SNF setting upon discharge to safely address deficits listed in patient problem list for decreased caregiver assistance and eventual return to PLOF.     Recommendations for follow up therapy are one component of a multi-disciplinary discharge planning process, led by the attending physician.  Recommendations may be updated based on patient status, additional functional criteria and insurance authorization.  Follow Up Recommendations  Skilled nursing-short term rehab (<3 hours/day) Can patient physically be transported by private vehicle: No   Assistance Recommended at Discharge Frequent or constant Supervision/Assistance  Patient can return home with the following Assistance with cooking/housework;Assist for transportation;Two people to help with walking and/or transfers;Help with stairs or ramp for entrance;Direct supervision/assist for financial management;Direct supervision/assist for  medications management;A lot of help with bathing/dressing/bathroom   Equipment Recommendations  Other (comment) (TBD at next venue of care)    Recommendations for Other Services       Precautions / Restrictions Precautions Precautions: Fall Restrictions Weight Bearing Restrictions: Yes RLE Weight Bearing: Weight bearing as tolerated     Mobility  Bed Mobility Overal bed mobility: Needs Assistance Bed Mobility: Sit to Supine     Supine to sit: +2 for physical assistance, Mod assist     General bed mobility comments: max verbal and tactile cues for sequencing    Transfers Overall transfer level: Needs assistance Equipment used: Rolling walker (2 wheels) Transfers: Sit to/from Stand Sit to Stand: Mod assist, From elevated surface, +2 physical assistance           General transfer comment: Mod multi-modal cues for sequencing    Ambulation/Gait               General Gait Details: Unable to advance either LE this session despite considerable effort   Stairs             Wheelchair Mobility    Modified Rankin (Stroke Patients Only)       Balance Overall balance assessment: Needs assistance, History of Falls   Sitting balance-Leahy Scale: Good     Standing balance support: During functional activity, Bilateral upper extremity supported, Reliant on assistive device for balance Standing balance-Leahy Scale: Poor Standing balance comment: Heavy reliance on the RW for support in static standing                            Cognition Arousal/Alertness: Awake/alert Behavior During Therapy: WFL for tasks assessed/performed Overall Cognitive Status: Within Functional Limits for tasks assessed  Exercises Total Joint Exercises Ankle Circles/Pumps: AROM, 10 reps, Both Quad Sets: Strengthening, Both, 10 reps Gluteal Sets: Strengthening, Both, 10 reps Heel Slides: AAROM, Right, 10  reps Long Arc Quad: Strengthening, Both, 10 reps Knee Flexion: Strengthening, 10 reps, Both Other Exercises Other Exercises: Pt education provided on physiological benefits of activity    General Comments        Pertinent Vitals/Pain Pain Assessment Pain Score: 8  Pain Location: R LE Pain Descriptors / Indicators: Guarding, Grimacing, Sore, Aching Pain Intervention(s): Repositioned, Premedicated before session, Monitored during session    Home Living                          Prior Function            PT Goals (current goals can now be found in the care plan section) Progress towards PT goals: PT to reassess next treatment    Frequency    BID      PT Plan Current plan remains appropriate    Co-evaluation              AM-PAC PT "6 Clicks" Mobility   Outcome Measure  Help needed turning from your back to your side while in a flat bed without using bedrails?: A Lot Help needed moving from lying on your back to sitting on the side of a flat bed without using bedrails?: Total Help needed moving to and from a bed to a chair (including a wheelchair)?: Total Help needed standing up from a chair using your arms (e.g., wheelchair or bedside chair)?: A Lot Help needed to walk in hospital room?: Total Help needed climbing 3-5 steps with a railing? : Total 6 Click Score: 8    End of Session Equipment Utilized During Treatment: Gait belt Activity Tolerance: Patient limited by pain Patient left: Other (comment) (pt left sitting at EOB with OT at end of session) Nurse Communication: Mobility status PT Visit Diagnosis: Unsteadiness on feet (R26.81);Other abnormalities of gait and mobility (R26.89);Muscle weakness (generalized) (M62.81);Difficulty in walking, not elsewhere classified (R26.2);History of falling (Z91.81);Pain Pain - Right/Left: Right Pain - part of body: Leg     Time: 8032-1224 PT Time Calculation (min) (ACUTE ONLY): 30 min  Charges:   $Therapeutic Exercise: 8-22 mins $Therapeutic Activity: 8-22 mins                     D. Scott Tynisha Ogan PT, DPT 12/18/21, 12:55 PM

## 2021-12-18 NOTE — Progress Notes (Addendum)
Physical Therapy Treatment Patient Details Name: Gilbert EMBERTON Sr. MRN: [id number] DOB: 01/18/1941 Today's Date: 12/18/2021   History of Present Illness Gilbert SHIMABUKURO Sr. is a 81 y.o. Caucasian male with medical history significant for type 2 diabetes mellitus, essential hypertension, Parkinson's disease and stroke, who presented to the emergency room with acute onset of accidental mechanical fall with subsequent right hip pain.  The patient was getting out of a car and lost his balance with subsequent fall on his right side.  He denied any headache or dizziness or blurred vision. S/P R hip ORIF.    PT Comments    Pt was supine in bed with HOB elevated ~ 30 degrees upon arrival. Post transfusion Hgb >7. Spouse at bedside but left during session," I can't be in here during this." Pt is endorsing severe pain but with encouragement was agreeable to participate. Unwilling to attempt transfers but did sit EOB for several minutes and tolerated exercises while seated EOB and in supine. Overall pain greatly limited session.will require +2 assistance for any/all OOB activity. He is planning to DC to rehab once deemed medically stable and has received insurance authorization. Highly recommend SNF to maximize independence while decreasing caregiver burden.    Recommendations for follow up therapy are one component of a multi-disciplinary discharge planning process, led by the attending physician.  Recommendations may be updated based on patient status, additional functional criteria and insurance authorization.  Follow Up Recommendations  Skilled nursing-short term rehab (<3 hours/day)     Assistance Recommended at Discharge Frequent or constant Supervision/Assistance  Patient can return home with the following A lot of help with bathing/dressing/bathroom;Assistance with cooking/housework;Assist for transportation;Two people to help with walking and/or transfers;A lot of help with walking and/or  transfers;Help with stairs or ramp for entrance;Direct supervision/assist for financial management;Direct supervision/assist for medications management   Equipment Recommendations  Other (comment) (defer to next level of care)       Precautions / Restrictions Precautions Precautions: Fall Restrictions Weight Bearing Restrictions: Yes RLE Weight Bearing: Weight bearing as tolerated     Mobility  Bed Mobility Overal bed mobility: Needs Assistance Bed Mobility: Supine to Sit, Sit to Supine  Supine to sit: Mod assist, Max assist, HOB elevated Sit to supine: Max assist, HOB elevated   General bed mobility comments: very pain limited    Transfers  General transfer comment: pt was unwilling to attempt due to pain     Balance Overall balance assessment: Needs assistance, History of Falls Sitting-balance support: Feet supported Sitting balance-Leahy Scale: Good      Standing balance comment: not formally tested this date     Cognition Arousal/Alertness: Awake/alert Behavior During Therapy: WFL for tasks assessed/performed Overall Cognitive Status: Within Functional Limits for tasks assessed      Exercises   Pt tolerated exercises in supine and while EOB but requires a lot of assistance due to pain.        Pertinent Vitals/Pain Pain Assessment Faces Pain Scale: Hurts worst Pain Location: R LE Pain Descriptors / Indicators: Discomfort, Moaning, Guarding, Grimacing, Sore, Aching     PT Goals (current goals can now be found in the care plan section) Acute Rehab PT Goals Patient Stated Goal: to go to [facility]    Frequency    BID      PT Plan Current plan remains appropriate       AM-PAC PT "6 Clicks" Mobility   Outcome Measure  Help needed turning from your back to your  side while in a flat bed without using bedrails?: A Lot Help needed moving from lying on your back to sitting on the side of a flat bed without using bedrails?: A Lot Help needed  moving to and from a bed to a chair (including a wheelchair)?: A Lot Help needed standing up from a chair using your arms (e.g., wheelchair or bedside chair)?: A Lot Help needed to walk in hospital room?: Total Help needed climbing 3-5 steps with a railing? : Total 6 Click Score: 10    End of Session   Activity Tolerance: Patient limited by pain Patient left: in bed;with call bell/phone within reach;with bed alarm set;with SCD's reapplied Nurse Communication: Mobility status PT Visit Diagnosis: Unsteadiness on feet (R26.81);Other abnormalities of gait and mobility (R26.89);Muscle weakness (generalized) (M62.81);Difficulty in walking, not elsewhere classified (R26.2);History of falling (Z91.81);Pain Pain - Right/Left: Right Pain - part of body: Leg     Time:  -     Charges:                        Julaine Fusi PTA 12/18/21, 7:09 AM

## 2021-12-18 NOTE — Care Management Important Message (Signed)
Important Message  Patient Details  Name: Gilbert VENCES Sr. MRN: [id number] Date of Birth: 04/13/1941   Medicare Important Message Given:  Yes     Juliann Pulse A Chantea Surace 12/18/2021, 11:28 AM

## 2021-12-18 NOTE — Progress Notes (Signed)
Physical Therapy Treatment Patient Details Name: Gilbert CONLEY Sr. MRN: [id number] DOB: Dec 21, 1940 Today's Date: 12/18/2021   History of Present Illness Gilbert FATHEREE Sr. is a 81 y.o. Caucasian male with medical history significant for type 2 diabetes mellitus, essential hypertension, Parkinson's disease and stroke, who presented to the emergency room with acute onset of accidental mechanical fall with subsequent right hip pain.  The patient was getting out of a car and lost his balance with subsequent fall on his right side.  He denied any headache or dizziness or blurred vision. S/P R hip ORIF.    PT Comments    Pt required min encouragement to participate but put forth fair effort during the session, pain limited throughout despite pre-medication.  Pt required extensive +2 assist with all functional tasks and again was unable to advance either LE once in standing.  Pt will benefit from PT services in a SNF setting upon discharge to safely address deficits listed in patient problem list for decreased caregiver assistance and eventual return to PLOF.    Recommendations for follow up therapy are one component of a multi-disciplinary discharge planning process, led by the attending physician.  Recommendations may be updated based on patient status, additional functional criteria and insurance authorization.  Follow Up Recommendations  Skilled nursing-short term rehab (<3 hours/day) Can patient physically be transported by private vehicle: No   Assistance Recommended at Discharge Frequent or constant Supervision/Assistance  Patient can return home with the following Assistance with cooking/housework;Assist for transportation;Two people to help with walking and/or transfers;Help with stairs or ramp for entrance;Direct supervision/assist for financial management;Direct supervision/assist for medications management;A lot of help with bathing/dressing/bathroom   Equipment Recommendations   Other (comment) (TBD)    Recommendations for Other Services       Precautions / Restrictions Precautions Precautions: Fall Restrictions Weight Bearing Restrictions: Yes RLE Weight Bearing: Weight bearing as tolerated     Mobility  Bed Mobility Overal bed mobility: Needs Assistance Bed Mobility: Sit to Supine     Supine to sit: +2 for physical assistance, Max assist Sit to supine: Max assist, +2 for physical assistance   General bed mobility comments: max verbal and tactile cues for sequencing with near total assist for trunk and RLE control    Transfers Overall transfer level: Needs assistance Equipment used: Rolling walker (2 wheels) Transfers: Sit to/from Stand Sit to Stand: Mod assist, From elevated surface, +2 physical assistance           General transfer comment: Mod multi-modal cues for sequencing    Ambulation/Gait               General Gait Details: Unable to advance either LE   Stairs             Wheelchair Mobility    Modified Rankin (Stroke Patients Only)       Balance Overall balance assessment: Needs assistance, History of Falls Sitting-balance support: Feet supported Sitting balance-Leahy Scale: Good     Standing balance support: During functional activity, Bilateral upper extremity supported, Reliant on assistive device for balance Standing balance-Leahy Scale: Poor Standing balance comment: Heavy reliance on the RW for support in static standing                            Cognition Arousal/Alertness: Awake/alert Behavior During Therapy: WFL for tasks assessed/performed Overall Cognitive Status: Within Functional Limits for tasks assessed  Exercises Total Joint Exercises Ankle Circles/Pumps: AROM, 10 reps, Both Quad Sets: Strengthening, Both, 10 reps Gluteal Sets: Strengthening, Both, 10 reps Heel Slides: AAROM, Right, 10 reps Long Arc Quad:  Strengthening, Both, 10 reps Knee Flexion: Strengthening, 10 reps, Both Other Exercises Other Exercises: Pt education provided on physiological benefits of activity    General Comments        Pertinent Vitals/Pain Pain Assessment Pain Score: 8  Pain Location: R LE Pain Descriptors / Indicators: Guarding, Grimacing, Sore, Aching Pain Intervention(s): Repositioned, Premedicated before session, Monitored during session    Home Living                          Prior Function            PT Goals (current goals can now be found in the care plan section) Progress towards PT goals: Not progressing toward goals - comment (limited by pain)    Frequency    BID      PT Plan Current plan remains appropriate    Co-evaluation              AM-PAC PT "6 Clicks" Mobility   Outcome Measure  Help needed turning from your back to your side while in a flat bed without using bedrails?: A Lot Help needed moving from lying on your back to sitting on the side of a flat bed without using bedrails?: Total Help needed moving to and from a bed to a chair (including a wheelchair)?: Total Help needed standing up from a chair using your arms (e.g., wheelchair or bedside chair)?: A Lot Help needed to walk in hospital room?: Total Help needed climbing 3-5 steps with a railing? : Total 6 Click Score: 8    End of Session Equipment Utilized During Treatment: Gait belt Activity Tolerance: Patient limited by pain Patient left: in bed;with call bell/phone within reach;with bed alarm set Nurse Communication: Mobility status PT Visit Diagnosis: Unsteadiness on feet (R26.81);Other abnormalities of gait and mobility (R26.89);Muscle weakness (generalized) (M62.81);Difficulty in walking, not elsewhere classified (R26.2);History of falling (Z91.81);Pain Pain - Right/Left: Right Pain - part of body: Leg     Time: 3559-7416 PT Time Calculation (min) (ACUTE ONLY): 26 min  Charges:   $Therapeutic Exercise: 8-22 mins $Therapeutic Activity: 8-22 mins                     D. Scott Niemah Schwebke PT, DPT 12/18/21, 3:12 PM

## 2021-12-19 DIAGNOSIS — D62 Acute posthemorrhagic anemia: Secondary | ICD-10-CM | POA: Diagnosis not present

## 2021-12-19 DIAGNOSIS — S72001A Fracture of unspecified part of neck of right femur, initial encounter for closed fracture: Secondary | ICD-10-CM | POA: Diagnosis not present

## 2021-12-19 LAB — GLUCOSE, CAPILLARY
Glucose-Capillary: 157 mg/dL — ABNORMAL HIGH (ref 70–99)
Glucose-Capillary: 154 mg/dL — ABNORMAL HIGH (ref 70–99)

## 2021-12-19 LAB — HEMOGLOBIN AND HEMATOCRIT, BLOOD
HCT: 23.7 % — ABNORMAL LOW (ref 39.0–52.0)
Hemoglobin: 7.9 g/dL — ABNORMAL LOW (ref 13.0–17.0)

## 2021-12-19 LAB — CBC
HCT: 21.3 % — ABNORMAL LOW (ref 39.0–52.0)
Hemoglobin: 7.2 g/dL — ABNORMAL LOW (ref 13.0–17.0)
MCH: 30.5 pg (ref 26.0–34.0)
MCHC: 33.8 g/dL (ref 30.0–36.0)
MCV: 90.3 fL (ref 80.0–100.0)
Platelets: 133 10*3/uL — ABNORMAL LOW (ref 150–400)
RBC: 2.36 MIL/uL — ABNORMAL LOW (ref 4.22–5.81)
RDW: 15.7 % — ABNORMAL HIGH (ref 11.5–15.5)
WBC: 7.6 10*3/uL (ref 4.0–10.5)
nRBC: 0 % (ref 0.0–0.2)

## 2021-12-19 MED ORDER — POTASSIUM CHLORIDE 20 MEQ PO PACK
40.0000 meq | PACK | Freq: Once | ORAL | Status: AC
  Administered 2021-12-19: 40 meq via ORAL
  Filled 2021-12-19: qty 2

## 2021-12-19 NOTE — Progress Notes (Signed)
Physical Therapy Treatment Patient Details Name: Gilbert BATZEL Sr. MRN: [id number] DOB: [birth year] Today's Date: 12/19/2021   History of Present Illness Gilbert MAHARAJ Sr. is a 81 y.o. Caucasian male with medical history significant for type 2 diabetes mellitus, essential hypertension, Parkinson's disease and stroke, who presented to the emergency room with acute onset of accidental mechanical fall with subsequent right hip pain.  The patient was getting out of a car and lost his balance with subsequent fall on his right side.  He denied any headache or dizziness or blurred vision. S/P R hip ORIF.    PT Comments    Pt was seated in recliner with spouse and daughter present. He is agreeable to session requesting to return to bed. Family left for session. Spouse states " I don't need to be here while he is yelling/screaming." Pt endorses 0 pain at rest and 10/10 pain with very minimal movements. Yells out in pain with even the smallest movements. Author questions if pt is more so yelling due to fear/anxiety or actual pain. He was unable to stand and required total assist to return to EOB from recliner. Max assist to reposition from EOB to supine. Reviewed HEP handout and importance of doing exercises throughout the day. Highly recommend DC to SNF to maximize independence.    Recommendations for follow up therapy are one component of a multi-disciplinary discharge planning process, led by the attending physician.  Recommendations may be updated based on patient status, additional functional criteria and insurance authorization.  Follow Up Recommendations  Skilled nursing-short term rehab (<3 hours/day)     Assistance Recommended at Discharge Frequent or constant Supervision/Assistance  Patient can return home with the following Two people to help with walking and/or transfers;A lot of help with bathing/dressing/bathroom;Assistance with cooking/housework;Direct supervision/assist for  medications management;Direct supervision/assist for financial management;Assist for transportation;Help with stairs or ramp for entrance   Equipment Recommendations  Other (comment) (defer to next level of care)       Precautions / Restrictions Precautions Precautions: Fall Restrictions Weight Bearing Restrictions: Yes RLE Weight Bearing: Weight bearing as tolerated     Mobility  Bed Mobility Overal bed mobility: Needs Assistance Bed Mobility: Sit to Supine  Supine to sit: Max assist (HOB elevated ~ 20-30 degrees) Sit to supine: Max assist   General bed mobility comments: max assist to progress from EOB short sit > supine. pt did assist with reposition to [facility] with increased time and vcs for technique.    Transfers Overall transfer level: Needs assistance Equipment used: Rolling walker (2 wheels) Transfers: Sit to/from Stand Sit to Stand: Total assist, +2 safety/equipment      General transfer comment: Pt attempted to stand from recliner >RW however unable. yells out in pain with both attempts to stand. Pt had to be total assisted(squat pivot towards L with pt pulling on bed armrest. Anxiety/Pain severely limiting pt's abilities    Ambulation/Gait    General Gait Details: Unable to advance to gait or even standing   Balance Overall balance assessment: Needs assistance, History of Falls Sitting-balance support: Bilateral upper extremity supported, Feet supported Sitting balance-Leahy Scale: Good     Standing balance support: During functional activity, Bilateral upper extremity supported, Reliant on assistive device for balance Standing balance-Leahy Scale: Poor Standing balance comment: Heavy reliance on the RW for support in static standing. Very anxious and fearful of falling      Cognition Arousal/Alertness: Awake/alert Behavior During Therapy: WFL for tasks assessed/performed Overall Cognitive Status: Within Functional  Limits for tasks assessed      General  Comments: Pt is alert but somewhat anxious about moving and session.        Exercises General Exercises - Lower Extremity Ankle Circles/Pumps: AROM, 10 reps Quad Sets: AROM, 10 reps Gluteal Sets: 10 reps Long Arc Quad: AAROM, 10 reps Heel Slides: AAROM, 10 reps Hip ABduction/ADduction: AAROM, 10 reps Straight Leg Raises: AAROM, 10 reps    General Comments General comments (skin integrity, edema, etc.): pt endorses not doing any exercises and " i didnt even look at HEP handout." reviewed importance and had pt perform all exercises several times for understanding. due to pain and fastigue, unable to fully complete HEP handout.      Pertinent Vitals/Pain Pain Assessment Pain Assessment: 0-10 Pain Score: 10-Worst pain ever Pain Location: R LE Pain Descriptors / Indicators:  (pt yells out in pain throughout session when attempting to stand with return to bed.) Pain Intervention(s): Limited activity within patient's tolerance, Monitored during session, Premedicated before session, Repositioned, Patient requesting pain meds-RN notified     PT Goals (current goals can now be found in the care plan section) Acute Rehab PT Goals Patient Stated Goal: to go to [facility] Progress towards PT goals: Not progressing toward goals - comment (pain and anxiety/fear limiting)    Frequency    BID      PT Plan Current plan remains appropriate       AM-PAC PT "6 Clicks" Mobility   Outcome Measure  Help needed turning from your back to your side while in a flat bed without using bedrails?: A Lot Help needed moving from lying on your back to sitting on the side of a flat bed without using bedrails?: A Lot Help needed moving to and from a bed to a chair (including a wheelchair)?: A Lot Help needed standing up from a chair using your arms (e.g., wheelchair or bedside chair)?: Total Help needed to walk in hospital room?: Total Help needed climbing 3-5 steps with a railing? : Total 6  Click Score: 9    End of Session   Activity Tolerance: Patient limited by fatigue;Patient limited by pain;Other (comment) (fear/anxiety of falling) Patient left: in bed;with call bell/phone within reach;with bed alarm set Nurse Communication: Mobility status PT Visit Diagnosis: Unsteadiness on feet (R26.81);Other abnormalities of gait and mobility (R26.89);Muscle weakness (generalized) (M62.81);Difficulty in walking, not elsewhere classified (R26.2);History of falling (Z91.81);Pain Pain - Right/Left: Right Pain - part of body: Leg     Time: 5465-0354 PT Time Calculation (min) (ACUTE ONLY): 24 min  Charges:  $Therapeutic Exercise: 8-22 mins $Therapeutic Activity: 8-22 mins                     Julaine Fusi PTA 12/19/21, 4:08 PM

## 2021-12-19 NOTE — Progress Notes (Signed)
Physical Therapy Treatment Patient Details Name: Gilbert [name] Sr. MRN: [id number] DOB: [birth year] Today's Date: 12/19/2021   History of Present Illness Gilbert PARADISO Sr. is a 81 y.o. Caucasian male with medical history significant for type 2 diabetes mellitus, essential hypertension, Parkinson's disease and stroke, who presented to the emergency room with acute onset of accidental mechanical fall with subsequent right hip pain.  The patient was getting out of a car and lost his balance with subsequent fall on his right side.  He denied any headache or dizziness or blurred vision. S/P R hip ORIF.    PT Comments    Pt was long sitting in bed, anxious about session but was pre-medicated. Overall tolerated session much better than two days prior. He is alert and agreeable to get OOB to recliner for lunch. Increased time throughout session to perform desired task due to pain. He likes to dictate session because of fear of pain and falling. Pt required max assist of one to progress to EOB short sit from long sitting in bed. Stood two times total with max assist. On second attempt, pt was able to stand and pivot to recliner/ max vcs for keeping hands on RW for support. He was unable to take steps/clear LEs but was able to pivot towards L > recliner. Once in recliner tolerate exercises however most were heavy assisted exercises. Author questions pt's effort with there ex. Overall pt shows improvement with decreased overall pain. No yelling out this session. Continue to recommend DC to SNF to address deficits while maximizing independence with all ADLs.    Recommendations for follow up therapy are one component of a multi-disciplinary discharge planning process, led by the attending physician.  Recommendations may be updated based on patient status, additional functional criteria and insurance authorization.  Follow Up Recommendations  Skilled nursing-short term rehab (<3 hours/day)     Assistance  Recommended at Discharge Frequent or constant Supervision/Assistance  Patient can return home with the following Assistance with cooking/housework;Assist for transportation;Help with stairs or ramp for entrance;Direct supervision/assist for financial management;Direct supervision/assist for medications management;A lot of help with bathing/dressing/bathroom;A lot of help with walking and/or transfers   Equipment Recommendations  Other (comment) (defer to next level of care)       Precautions / Restrictions Precautions Precautions: Fall Restrictions Weight Bearing Restrictions: Yes RLE Weight Bearing: Weight bearing as tolerated     Mobility  Bed Mobility Overal bed mobility: Needs Assistance Bed Mobility: Supine to Sit, Sit to Supine  Supine to sit: Max assist (HOB elevated ~ 20-30 degrees)  General bed mobility comments: Increased time to perform due to pain. Pt likes to dictate how he wants mobility to be performed due to pain    Transfers Overall transfer level: Needs assistance Equipment used: Rolling walker (2 wheels) Transfers: Sit to/from Stand Sit to Stand: Max assist, From elevated surface  General transfer comment: Pt was able to stand 2 x with RW from elevated bed height. On second stand performed pivot to recliner.    Ambulation/Gait    General Gait Details: Unable to advance to gait   Balance Overall balance assessment: Needs assistance, History of Falls Sitting-balance support: Feet supported Sitting balance-Leahy Scale: Good     Standing balance support: During functional activity, Bilateral upper extremity supported, Reliant on assistive device for balance Standing balance-Leahy Scale: Poor Standing balance comment: Heavy reliance on the RW for support in static standing. Very anxious and fearful of falling  Cognition Arousal/Alertness: Awake/alert Behavior During Therapy: WFL for tasks assessed/performed Overall Cognitive Status: Within Functional  Limits for tasks assessed      General Comments: Pt is alert but somewhat anxious about moving and session.        Exercises General Exercises - Lower Extremity Ankle Circles/Pumps: AROM, 10 reps Quad Sets: AROM, 10 reps Gluteal Sets: 10 reps Long Arc Quad: AAROM, 10 reps Heel Slides: AAROM, 10 reps Hip ABduction/ADduction: AAROM, 10 reps Straight Leg Raises: AAROM, 10 reps        Pertinent Vitals/Pain Pain Assessment Pain Assessment: 0-10 Pain Score: 10-Worst pain ever Pain Location: R LE Pain Descriptors / Indicators: Guarding, Grimacing, Sore, Aching Pain Intervention(s): Limited activity within patient's tolerance, Monitored during session, Premedicated before session, Repositioned, Ice applied     PT Goals (current goals can now be found in the care plan section) Acute Rehab PT Goals Patient Stated Goal: to go to [facility] Progress towards PT goals: Progressing toward goals    Frequency    BID      PT Plan Current plan remains appropriate       AM-PAC PT "6 Clicks" Mobility   Outcome Measure  Help needed turning from your back to your side while in a flat bed without using bedrails?: A Lot Help needed moving from lying on your back to sitting on the side of a flat bed without using bedrails?: A Lot Help needed moving to and from a bed to a chair (including a wheelchair)?: A Lot Help needed standing up from a chair using your arms (e.g., wheelchair or bedside chair)?: A Lot Help needed to walk in hospital room?: Total Help needed climbing 3-5 steps with a railing? : Total 6 Click Score: 10    End of Session   Activity Tolerance: Patient limited by pain Patient left: in bed;with call bell/phone within reach;with bed alarm set Nurse Communication: Mobility status PT Visit Diagnosis: Unsteadiness on feet (R26.81);Other abnormalities of gait and mobility (R26.89);Muscle weakness (generalized) (M62.81);Difficulty in walking, not elsewhere classified  (R26.2);History of falling (Z91.81);Pain Pain - Right/Left: Right Pain - part of body: Leg     Time: 9201-0071 PT Time Calculation (min) (ACUTE ONLY): 22 min  Charges:  $Therapeutic Activity: 8-22 mins                     Julaine Fusi PTA 12/19/21, 12:44 PM

## 2021-12-19 NOTE — Progress Notes (Signed)
Patient is alert and oriented x4. Denied pain. Informed me the previous night that he does not like to be awakened throughout the night because he is unable to sleep if he is being bothered throughout the night. Acknowledged patient's wishes and got his vitals and gave him his scheduled meds together. Informed tech to not take any midnight vitals. Quietly rounded on patient and witnessed his chest rise and fall as he slept. Patient refused glucerna supplement. Denied additional needs.

## 2021-12-19 NOTE — Progress Notes (Signed)
Subjective: 4 Days Post-Op Procedure(s) (LRB): INTRAMEDULLARY (IM) NAIL INTERTROCHANTERIC (Right)   Patient is alert and awake sitting up in bed.  Dressings are dry.  Neurovascular status is good.  Hemoglobin is stable.  Plans on going to [place] skilled nursing  Patient reports pain as mild.  Objective:   VITALS:   Vitals:   12/18/21 2141 12/19/21 0426  BP: (!) 147/64 (!) 132/54  Pulse: 77 67  Resp: 18 20  Temp: 98.7 F (37.1 C) 98.3 F (36.8 C)  SpO2: 95% 95%    Neurologically intact Incision: dressing C/D/I  LABS Recent Labs    12/17/21 0513 12/17/21 1229 12/18/21 0351 12/18/21 1602 12/19/21 0414  HGB 6.4*   < > 7.3* 8.1* 7.2*  HCT 19.1*   < > 21.5* 24.2* 21.3*  WBC 8.2  --  8.0  --  7.6  PLT 107*  --  106*  --  133*   < > = values in this interval not displayed.    Recent Labs    12/17/21 0513 12/18/21 0351  NA 132* 132*  K 3.4* 3.8  BUN 23 19  CREATININE 1.24 1.00  GLUCOSE 176* 175*    No results for input(s): "LABPT", "INR" in the last 72 hours.   Assessment/Plan: 4 Days Post-Op Procedure(s) (LRB): INTRAMEDULLARY (IM) NAIL INTERTROCHANTERIC (Right)   Up with therapy Discharge to SNF

## 2021-12-19 NOTE — Progress Notes (Signed)
Progress Note   Patient: Gilbert MOISE Sr. ZDG:[id number] DOB: [birth year] DOA: 12/14/2021     5 DOS: the patient was seen and examined on 12/19/2021   Brief hospital course: "81 y.o. Caucasian male with medical history significant for type 2 diabetes mellitus, essential hypertension, Parkinson's disease and stroke, who presented to the emergency room with acute onset of accidental mechanical fall with subsequent right hip pain."  Right hip x-ray showed intertrochanteric fracture of the right femoral neck.  Orthopedic surgery was consulted.  Patient underwent ORIF with intramedullary nail on 8/22.    Assessment and Plan: * Closed right hip fracture [facility]) Orthopedic surgery following. Underwent ORIF with intramedullary nailing on 8/22. PT/OT evaluations. Pain control, bowel regimen. DVT prophylaxis - SCD's, 81 mg ASA  Follow Ortho's recommendations.   ABLA (acute blood loss anemia) Pre-op Hbg was 10.2.  On POD-1 Hbg down to 7.6, then down to 6.4 on POD2.   Transfused 1 unit pRBC's on 8/24. Hbg today is 7.2, down from 8.1 yesterday afternoon --check afternoon H&H -- transfuse if Hbg < 7.0 --monitor surgical site for signs of bleeding  Hypomagnesemia Replaced for Mg 1.6 on 8/23 Monitor Mg and replace PRN.  Hyponatremia Na 129 on admission.  Family report poor PO intake recently.  Improved to 132 with IV fluids. --Stop IV fluids  --Monitor BMP --Encourage PO intake and hydration  Hypokalemia K replaced on admission. Monitor and replace PRN  Acute kidney injury superimposed on CKD ([place]) Cr on admission was at baseline 1.21, improved to 1.03 >> 1.43 >> 1.24 >> 1.00.  Treated initially with IV fluids.  Possible Cr increase was from transient hypotension during surgery.   --Monitor urine output --Bladder scan PRN to monitor for retention --Off IV fluids --Monitor BMP  CKD (chronic kidney disease), stage IIIa Baseline Cr appears ~1.1-1.2. Had very mild AKI in post-op  setting which resolved. Monitor BMP.  Hypertensive urgency Continue home meds. PRN IV hydralazine  Pain control  Dyslipidemia Continue statin   Parkinson disease ([place]) Ccontinue Sinemet  Hypothyroidism Continue Synthroid  Type II diabetes mellitus with renal manifestations (HCC) With hyperglycemia --Increase 70/30 insulin to 10 units BID --Adjust insulin as needed         Subjective: Pt sitting up in recliner, family visiting at bedside.  He denies having any pain sitting still but has significant pain with attempts at mobility still.  No other acute complaints at this time.    Physical Exam: Vitals:   12/18/21 0727 12/18/21 1619 12/18/21 2141 12/19/21 0426  BP: (!) 133/58 123/62 (!) 147/64 (!) 132/54  Pulse: 73 70 77 67  Resp:  16 18 20   Temp: 97.9 F (36.6 C) 98.1 F (36.7 C) 98.7 F (37.1 C) 98.3 F (36.8 C)  TempSrc:  Oral Oral   SpO2: 96% 99% 95% 95%  Weight:      Height:       General exam: awake, alert, no acute distress HEENT: moist mucous membranes, hearing grossly normal  Respiratory system: CTAB, no wheezes, rales or rhonchi, normal respiratory effort. Cardiovascular system: normal S1/S2, RRR, no JVD, murmurs, rubs, gallops, no pedal edema.   Central nervous system: A&O x3. no gross focal neurologic deficits, normal speech Extremities: Honeycomb dressing on right lateral hip with no surrounding swelling erythema or warmth, no edema, normal tone, moves all extremities Skin: Skin appears pale, dry, intact, normal temperature Psychiatry: normal mood, flat affect, judgement and insight appear normal    Data Reviewed:  Notable labs --  Hbg 7.2 this morning down from 8.1 yesterday afternoon, hemoglobin 7.9  Family Communication: Family at bedside on rounds today  Disposition: Status is: Inpatient Remains inpatient appropriate because:  -- requires SNF/rehab.   -- Monitoring anemia    Planned Discharge Destination: SNF.  Anticipate medically  ready on Monday 8/28     Time spent: 35 minutes  Author: [name], DO 12/19/2021 5:10 PM  For on call review [email or web address].

## 2021-12-20 ENCOUNTER — Encounter: Payer: Self-pay | Admitting: Family Medicine

## 2021-12-20 DIAGNOSIS — S72001A Fracture of unspecified part of neck of right femur, initial encounter for closed fracture: Secondary | ICD-10-CM | POA: Diagnosis not present

## 2021-12-20 DIAGNOSIS — D62 Acute posthemorrhagic anemia: Secondary | ICD-10-CM | POA: Diagnosis not present

## 2021-12-20 LAB — BASIC METABOLIC PANEL
Anion gap: 4 — ABNORMAL LOW (ref 5–15)
BUN: 19 mg/dL (ref 8–23)
CO2: 25 mmol/L (ref 22–32)
Calcium: 7.8 mg/dL — ABNORMAL LOW (ref 8.9–10.3)
Chloride: 104 mmol/L (ref 98–111)
Creatinine, Ser: 1.09 mg/dL (ref 0.61–1.24)
GFR, Estimated: >60 mL/min (ref >60)
Glucose, Bld: 134 mg/dL — ABNORMAL HIGH (ref 70–99)
Potassium: 4.4 mmol/L (ref 3.5–5.1)
Sodium: 133 mmol/L — ABNORMAL LOW (ref 135–145)

## 2021-12-20 LAB — GLUCOSE, CAPILLARY
Glucose-Capillary: 120 mg/dL — ABNORMAL HIGH (ref 70–99)
Glucose-Capillary: 140 mg/dL — ABNORMAL HIGH (ref 70–99)
Glucose-Capillary: 265 mg/dL — ABNORMAL HIGH (ref 70–99)
Glucose-Capillary: 204 mg/dL — ABNORMAL HIGH (ref 70–99)

## 2021-12-20 LAB — CBC
HCT: 23.1 % — ABNORMAL LOW (ref 39.0–52.0)
Hemoglobin: 7.6 g/dL — ABNORMAL LOW (ref 13.0–17.0)
MCH: 29.7 pg (ref 26.0–34.0)
MCHC: 32.9 g/dL (ref 30.0–36.0)
MCV: 90.2 fL (ref 80.0–100.0)
Platelets: 156 10*3/uL (ref 150–400)
RBC: 2.56 MIL/uL — ABNORMAL LOW (ref 4.22–5.81)
RDW: 15.7 % — ABNORMAL HIGH (ref 11.5–15.5)
WBC: 6.9 10*3/uL (ref 4.0–10.5)
nRBC: 0 % (ref 0.0–0.2)

## 2021-12-20 NOTE — Progress Notes (Signed)
Physical Therapy Treatment Patient Details Name: Gilbert CATERINO Sr. MRN: [id number] DOB: 1941-02-06 Today's Date: 12/20/2021   History of Present Illness Gilbert FORDHAM Sr. is a 81 y.o. Caucasian male with medical history significant for type 2 diabetes mellitus, essential hypertension, Parkinson's disease and stroke, who presented to the emergency room with acute onset of accidental mechanical fall with subsequent right hip pain.  The patient was getting out of a car and lost his balance with subsequent fall on his right side.  He denied any headache or dizziness or blurred vision. S/P R hip ORIF.    PT Comments    Pt initially refusing session stating he is not going to get up.  Agrees to ex and is able to tolerate increased ROM R LE today with less discomfort.  During ex it is noted that his bed is saturated due to failed pur-wic.  He does agree to sit EOB and is able to transition with mod a x 1 and heavy verbal and tactile cues to EOB.  Once sitting he is steady.  Tech in to assist with bathing and full linen change.  He agrees to stand at EOB with mod a x 2 but unable to sidestep to reposition higher up in bed.  Flatly refuses to get to chair today.  He is able to return to supine with mod a x 2 and assist with repositioning in bed with cues and time.  Overall improved session today.  Fear of pain and falling remain primary barriers to progressing mobility.   Recommendations for follow up therapy are one component of a multi-disciplinary discharge planning process, led by the attending physician.  Recommendations may be updated based on patient status, additional functional criteria and insurance authorization.  Follow Up Recommendations  Skilled nursing-short term rehab (<3 hours/day)     Assistance Recommended at Discharge Frequent or constant Supervision/Assistance  Patient can return home with the following Two people to help with walking and/or transfers;A lot of help with  bathing/dressing/bathroom;Assistance with cooking/housework;Direct supervision/assist for medications management;Direct supervision/assist for financial management;Assist for transportation;Help with stairs or ramp for entrance   Equipment Recommendations  Other (comment) (defer to next level of care)    Recommendations for Other Services       Precautions / Restrictions Precautions Precautions: Fall Restrictions Weight Bearing Restrictions: Yes RLE Weight Bearing: Weight bearing as tolerated     Mobility  Bed Mobility Overal bed mobility: Needs Assistance Bed Mobility: Supine to Sit     Supine to sit: Min assist, Mod assist     General bed mobility comments: increased time and tactile cues but does do better today    Transfers Overall transfer level: Needs assistance Equipment used: Rolling walker (2 wheels) Transfers: Sit to/from Stand Sit to Stand: Mod assist, +2 physical assistance           General transfer comment: stood but unable to take any sidesteps to reposition in bed.    Ambulation/Gait               General Gait Details: stood but no gait today   Stairs             Wheelchair Mobility    Modified Rankin (Stroke Patients Only)       Balance Overall balance assessment: Needs assistance, History of Falls Sitting-balance support: Bilateral upper extremity supported, Feet supported Sitting balance-Leahy Scale: Good     Standing balance support: During functional activity, Bilateral upper extremity supported, Reliant on assistive  device for balance Standing balance-Leahy Scale: Poor Standing balance comment: Heavy reliance on the RW for support in static standing.  anxious and fearful of falling                            Cognition Arousal/Alertness: Awake/alert Behavior During Therapy: WFL for tasks assessed/performed Overall Cognitive Status: Within Functional Limits for tasks assessed                                  General Comments: Pt is alert but somewhat anxious about moving and session.        Exercises General Exercises - Lower Extremity Ankle Circles/Pumps: AROM, 10 reps Quad Sets: AROM, 10 reps Gluteal Sets: 10 reps Long Arc Quad: AAROM, 10 reps Heel Slides: AAROM, 10 reps Hip ABduction/ADduction: AAROM, 10 reps Straight Leg Raises: AAROM, 10 reps Other Exercises Other Exercises: sate EOB x 10 minutes for bathing and linen change.    General Comments        Pertinent Vitals/Pain Pain Assessment Pain Assessment: Faces Faces Pain Scale: Hurts even more Pain Location: R LE Pain Descriptors / Indicators: Grimacing, Operative site guarding, Sore Pain Intervention(s): Limited activity within patient's tolerance, Monitored during session, Premedicated before session, Repositioned    Home Living                          Prior Function            PT Goals (current goals can now be found in the care plan section) Progress towards PT goals: Progressing toward goals    Frequency    BID      PT Plan Current plan remains appropriate    Co-evaluation              AM-PAC PT "6 Clicks" Mobility   Outcome Measure  Help needed turning from your back to your side while in a flat bed without using bedrails?: A Lot Help needed moving from lying on your back to sitting on the side of a flat bed without using bedrails?: A Lot Help needed moving to and from a bed to a chair (including a wheelchair)?: A Lot Help needed standing up from a chair using your arms (e.g., wheelchair or bedside chair)?: A Lot Help needed to walk in hospital room?: Total Help needed climbing 3-5 steps with a railing? : Total 6 Click Score: 10    End of Session Equipment Utilized During Treatment: Gait belt Activity Tolerance: Patient limited by fatigue;Patient limited by pain;Other (comment) (fear/anxiety of falling) Patient left: in bed;with call bell/phone within  reach;with bed alarm set Nurse Communication: Mobility status PT Visit Diagnosis: Unsteadiness on feet (R26.81);Other abnormalities of gait and mobility (R26.89);Muscle weakness (generalized) (M62.81);Difficulty in walking, not elsewhere classified (R26.2);History of falling (Z91.81);Pain Pain - Right/Left: Right Pain - part of body: Leg     Time: 9678-9381 PT Time Calculation (min) (ACUTE ONLY): 26 min  Charges:  $Therapeutic Exercise: 8-22 mins $Therapeutic Activity: 8-22 mins                   Chesley Noon, PTA 12/20/21, 11:09 AM

## 2021-12-20 NOTE — Progress Notes (Signed)
Progress Note   Patient: Gilbert BECRAFT Sr. JWJ:[id number] DOB: [birth year] DOA: 12/14/2021     6 DOS: the patient was seen and examined on 12/20/2021   Brief hospital course: "81 y.o. Caucasian male with medical history significant for type 2 diabetes mellitus, essential hypertension, Parkinson's disease and stroke, who presented to the emergency room with acute onset of accidental mechanical fall with subsequent right hip pain."  Right hip x-ray showed intertrochanteric fracture of the right femoral neck.  Orthopedic surgery was consulted.  Patient underwent ORIF with intramedullary nail on 8/22.    Assessment and Plan: * Closed right hip fracture [facility]) Orthopedic surgery following. Underwent ORIF with intramedullary nailing on 8/22. PT/OT evaluations. Pain control, bowel regimen. DVT prophylaxis - SCD's, 81 mg ASA  Follow Ortho's recommendations.   ABLA (acute blood loss anemia) Pre-op Hbg was 10.2.  On POD-1 Hbg down to 7.6, then down to 6.4 on POD2.   Transfused 1 unit pRBC's on 8/24. Hbg today is 7.9 yesterday afternoon, this morning 7.6, overall stable --H&H tomorrow 1 -- transfuse if Hbg < 7.0 --monitor surgical site for signs of bleeding  Hypomagnesemia Replaced for Mg 1.6 on 8/23 Monitor Mg and replace PRN.  Hyponatremia Na 129 on admission.  Family report poor PO intake recently.  Improved to 132 with IV fluids. --Stop IV fluids  --Monitor BMP --Encourage PO intake and hydration  Hypokalemia K replaced on admission. Monitor and replace PRN  CKD (chronic kidney disease), stage IIIa Baseline Cr appears ~1.1-1.2. Had very mild AKI in post-op setting which resolved. Monitor BMP.  Acute kidney injury superimposed on CKD (HCC)-resolved as of 12/20/2021 Cr on admission was at baseline 1.21, improved to 1.03 >> 1.43 >> 1.24 >> 1.00.  Treated initially with IV fluids.  Possible Cr increase was from transient hypotension during surgery.   --Monitor urine  output --Bladder scan PRN to monitor for retention --Off IV fluids --Monitor BMP  Hypertensive urgency-resolved as of 12/20/2021 Continue home meds. PRN IV hydralazine  Pain control  Dyslipidemia Continue statin   Parkinson disease ([place]) Ccontinue Sinemet  Hypothyroidism Continue Synthroid  Type II diabetes mellitus with renal manifestations (HCC) With hyperglycemia --Increase 70/30 insulin to 10 units BID --Adjust insulin as needed         Subjective: Pt was awake resting in bed, he had been up in the chair and recently returned to bed with therapy.  He reports pain severity about half of what it has been but still limiting his mobility significantly.  Hopes to go to rehab tomorrow    Physical Exam: Vitals:   12/20/21 0439 12/20/21 0945 12/20/21 0946 12/20/21 1610  BP: (!) 132/54 120/63 120/63 (!) 135/56  Pulse: 63 69 69 67  Resp: 17 16  17   Temp: 98.3 F (36.8 C) 98.8 F (37.1 C)  98.1 F (36.7 C)  TempSrc:      SpO2: 98% 96%  97%  Weight:      Height:       General exam: awake, alert, no acute distress Respiratory system: On room air, normal respiratory effort. Cardiovascular system: Regular rate and rhythm, no peripheral edema.   Central nervous system: A&O x3. no gross focal neurologic deficits, normal speech Extremities: Occlusive honeycomb dressing on right lateral hip no surrounding erythema or swelling Skin: Dry, intact, normal temperature, no rashes seen Psychiatry: normal mood, flat affect, judgement and insight appear normal    Data Reviewed:  Notable labs --hemoglobin 7.6 from 7.9 otherwise normal CBC.  Sodium  133, glucose 134  Family Communication: Family at bedside on rounds 8/26  Disposition: Status is: Inpatient Remains inpatient appropriate because:  -- requires SNF/rehab.       Planned Discharge Destination: SNF.   Anticipate medically ready on Monday 8/28     Time spent: 35 minutes  Author: [name],  DO 12/20/2021 6:48 PM  For on call review [email or web address].

## 2021-12-20 NOTE — Plan of Care (Signed)

## 2021-12-20 NOTE — Progress Notes (Signed)
Subjective: 5 Days Post-Op Procedure(s) (LRB): INTRAMEDULLARY (IM) NAIL INTERTROCHANTERIC (Right) Patient is sitting up in bed alert and awake.  He complains of moderate pain.  Says he is not progressing with PT very much because it hurts.  Patient reports pain as moderate.  Objective:   VITALS:   Vitals:   12/20/21 0945 12/20/21 0946  BP: 120/63 120/63  Pulse: 69 69  Resp: 16   Temp: 98.8 F (37.1 C)   SpO2: 96%     Neurologically intact Incision: dressing C/D/I  LABS Recent Labs    12/18/21 0351 12/18/21 1602 12/19/21 0414 12/19/21 1253 12/20/21 0756  HGB 7.3*   < > 7.2* 7.9* 7.6*  HCT 21.5*   < > 21.3* 23.7* 23.1*  WBC 8.0  --  7.6  --  6.9  PLT 106*  --  133*  --  156   < > = values in this interval not displayed.    Recent Labs    12/18/21 0351 12/20/21 0756  NA 132* 133*  K 3.8 4.4  BUN 19 19  CREATININE 1.00 1.09  GLUCOSE 175* 134*    No results for input(s): "LABPT", "INR" in the last 72 hours.   Assessment/Plan: 5 Days Post-Op Procedure(s) (LRB): INTRAMEDULLARY (IM) NAIL INTERTROCHANTERIC (Right)   Up with therapy Discharge to SNF

## 2021-12-21 DIAGNOSIS — S72001A Fracture of unspecified part of neck of right femur, initial encounter for closed fracture: Secondary | ICD-10-CM | POA: Diagnosis not present

## 2021-12-21 DIAGNOSIS — J90 Pleural effusion, not elsewhere classified: Secondary | ICD-10-CM | POA: Diagnosis not present

## 2021-12-21 DIAGNOSIS — S72001D Fracture of unspecified part of neck of right femur, subsequent encounter for closed fracture with routine healing: Secondary | ICD-10-CM | POA: Diagnosis not present

## 2021-12-21 DIAGNOSIS — M62838 Other muscle spasm: Secondary | ICD-10-CM | POA: Diagnosis not present

## 2021-12-21 DIAGNOSIS — S72141D Displaced intertrochanteric fracture of right femur, subsequent encounter for closed fracture with routine healing: Secondary | ICD-10-CM | POA: Diagnosis not present

## 2021-12-21 DIAGNOSIS — Z8673 Personal history of transient ischemic attack (TIA), and cerebral infarction without residual deficits: Secondary | ICD-10-CM | POA: Diagnosis not present

## 2021-12-21 DIAGNOSIS — I5023 Acute on chronic systolic (congestive) heart failure: Secondary | ICD-10-CM | POA: Diagnosis not present

## 2021-12-21 DIAGNOSIS — Z7982 Long term (current) use of aspirin: Secondary | ICD-10-CM | POA: Diagnosis not present

## 2021-12-21 DIAGNOSIS — I2510 Atherosclerotic heart disease of native coronary artery without angina pectoris: Secondary | ICD-10-CM | POA: Diagnosis present

## 2021-12-21 DIAGNOSIS — E039 Hypothyroidism, unspecified: Secondary | ICD-10-CM | POA: Diagnosis present

## 2021-12-21 DIAGNOSIS — N1831 Chronic kidney disease, stage 3a: Secondary | ICD-10-CM | POA: Diagnosis present

## 2021-12-21 DIAGNOSIS — I130 Hypertensive heart and chronic kidney disease with heart failure and stage 1 through stage 4 chronic kidney disease, or unspecified chronic kidney disease: Secondary | ICD-10-CM | POA: Diagnosis present

## 2021-12-21 DIAGNOSIS — I129 Hypertensive chronic kidney disease with stage 1 through stage 4 chronic kidney disease, or unspecified chronic kidney disease: Secondary | ICD-10-CM | POA: Diagnosis not present

## 2021-12-21 DIAGNOSIS — I700 Atherosclerosis of aorta: Secondary | ICD-10-CM | POA: Diagnosis not present

## 2021-12-21 DIAGNOSIS — Z8616 Personal history of COVID-19: Secondary | ICD-10-CM | POA: Diagnosis not present

## 2021-12-21 DIAGNOSIS — I5033 Acute on chronic diastolic (congestive) heart failure: Secondary | ICD-10-CM | POA: Diagnosis present

## 2021-12-21 DIAGNOSIS — R634 Abnormal weight loss: Secondary | ICD-10-CM | POA: Diagnosis not present

## 2021-12-21 DIAGNOSIS — Z79890 Hormone replacement therapy (postmenopausal): Secondary | ICD-10-CM | POA: Diagnosis not present

## 2021-12-21 DIAGNOSIS — K5900 Constipation, unspecified: Secondary | ICD-10-CM | POA: Diagnosis not present

## 2021-12-21 DIAGNOSIS — E785 Hyperlipidemia, unspecified: Secondary | ICD-10-CM | POA: Diagnosis present

## 2021-12-21 DIAGNOSIS — E871 Hypo-osmolality and hyponatremia: Secondary | ICD-10-CM | POA: Diagnosis present

## 2021-12-21 DIAGNOSIS — J9811 Atelectasis: Secondary | ICD-10-CM | POA: Diagnosis not present

## 2021-12-21 DIAGNOSIS — I110 Hypertensive heart disease with heart failure: Secondary | ICD-10-CM | POA: Diagnosis not present

## 2021-12-21 DIAGNOSIS — R1312 Dysphagia, oropharyngeal phase: Secondary | ICD-10-CM | POA: Diagnosis not present

## 2021-12-21 DIAGNOSIS — R471 Dysarthria and anarthria: Secondary | ICD-10-CM | POA: Diagnosis not present

## 2021-12-21 DIAGNOSIS — Z20822 Contact with and (suspected) exposure to covid-19: Secondary | ICD-10-CM | POA: Diagnosis present

## 2021-12-21 DIAGNOSIS — E782 Mixed hyperlipidemia: Secondary | ICD-10-CM | POA: Diagnosis not present

## 2021-12-21 DIAGNOSIS — E1121 Type 2 diabetes mellitus with diabetic nephropathy: Secondary | ICD-10-CM | POA: Diagnosis not present

## 2021-12-21 DIAGNOSIS — G20 Parkinson's disease: Secondary | ICD-10-CM | POA: Diagnosis present

## 2021-12-21 DIAGNOSIS — J189 Pneumonia, unspecified organism: Secondary | ICD-10-CM | POA: Diagnosis not present

## 2021-12-21 DIAGNOSIS — N3289 Other specified disorders of bladder: Secondary | ICD-10-CM | POA: Diagnosis not present

## 2021-12-21 DIAGNOSIS — I459 Conduction disorder, unspecified: Secondary | ICD-10-CM | POA: Diagnosis not present

## 2021-12-21 DIAGNOSIS — Z79899 Other long term (current) drug therapy: Secondary | ICD-10-CM | POA: Diagnosis not present

## 2021-12-21 DIAGNOSIS — W1830XD Fall on same level, unspecified, subsequent encounter: Secondary | ICD-10-CM | POA: Diagnosis not present

## 2021-12-21 DIAGNOSIS — I429 Cardiomyopathy, unspecified: Secondary | ICD-10-CM | POA: Diagnosis present

## 2021-12-21 DIAGNOSIS — E1122 Type 2 diabetes mellitus with diabetic chronic kidney disease: Secondary | ICD-10-CM | POA: Diagnosis present

## 2021-12-21 DIAGNOSIS — I5021 Acute systolic (congestive) heart failure: Secondary | ICD-10-CM | POA: Diagnosis not present

## 2021-12-21 DIAGNOSIS — I509 Heart failure, unspecified: Secondary | ICD-10-CM | POA: Diagnosis not present

## 2021-12-21 DIAGNOSIS — R197 Diarrhea, unspecified: Secondary | ICD-10-CM | POA: Diagnosis present

## 2021-12-21 DIAGNOSIS — R059 Cough, unspecified: Secondary | ICD-10-CM | POA: Diagnosis not present

## 2021-12-21 DIAGNOSIS — Z794 Long term (current) use of insulin: Secondary | ICD-10-CM | POA: Diagnosis not present

## 2021-12-21 DIAGNOSIS — R4701 Aphasia: Secondary | ICD-10-CM | POA: Diagnosis not present

## 2021-12-21 DIAGNOSIS — G40909 Epilepsy, unspecified, not intractable, without status epilepticus: Secondary | ICD-10-CM | POA: Diagnosis not present

## 2021-12-21 DIAGNOSIS — E876 Hypokalemia: Secondary | ICD-10-CM | POA: Diagnosis present

## 2021-12-21 DIAGNOSIS — Z888 Allergy status to other drugs, medicaments and biological substances status: Secondary | ICD-10-CM | POA: Diagnosis not present

## 2021-12-21 DIAGNOSIS — N179 Acute kidney failure, unspecified: Secondary | ICD-10-CM | POA: Diagnosis not present

## 2021-12-21 DIAGNOSIS — Z515 Encounter for palliative care: Secondary | ICD-10-CM | POA: Diagnosis not present

## 2021-12-21 DIAGNOSIS — W19XXXD Unspecified fall, subsequent encounter: Secondary | ICD-10-CM | POA: Diagnosis not present

## 2021-12-21 DIAGNOSIS — M109 Gout, unspecified: Secondary | ICD-10-CM | POA: Diagnosis not present

## 2021-12-21 DIAGNOSIS — I69320 Aphasia following cerebral infarction: Secondary | ICD-10-CM | POA: Diagnosis not present

## 2021-12-21 DIAGNOSIS — J690 Pneumonitis due to inhalation of food and vomit: Secondary | ICD-10-CM | POA: Diagnosis present

## 2021-12-21 DIAGNOSIS — I420 Dilated cardiomyopathy: Secondary | ICD-10-CM | POA: Diagnosis not present

## 2021-12-21 DIAGNOSIS — D62 Acute posthemorrhagic anemia: Secondary | ICD-10-CM | POA: Diagnosis present

## 2021-12-21 DIAGNOSIS — E038 Other specified hypothyroidism: Secondary | ICD-10-CM | POA: Diagnosis not present

## 2021-12-21 DIAGNOSIS — F0283 Dementia in other diseases classified elsewhere, unspecified severity, with mood disturbance: Secondary | ICD-10-CM | POA: Diagnosis not present

## 2021-12-21 LAB — HEMOGLOBIN AND HEMATOCRIT, BLOOD
HCT: 24.4 % — ABNORMAL LOW (ref 39.0–52.0)
Hemoglobin: 8 g/dL — ABNORMAL LOW (ref 13.0–17.0)

## 2021-12-21 LAB — GLUCOSE, CAPILLARY
Glucose-Capillary: 156 mg/dL — ABNORMAL HIGH (ref 70–99)
Glucose-Capillary: 174 mg/dL — ABNORMAL HIGH (ref 70–99)
Glucose-Capillary: 209 mg/dL — ABNORMAL HIGH (ref 70–99)

## 2021-12-21 MED ORDER — MAGNESIUM HYDROXIDE 400 MG/5ML PO SUSP: 30.0000 mL | Freq: Every day | ORAL | 0 refills | Status: AC | PRN

## 2021-12-21 MED ORDER — ASPIRIN 81 MG PO TBEC: 81.0000 mg | DELAYED_RELEASE_TABLET | Freq: Two times a day (BID) | ORAL | 2 refills | Status: AC

## 2021-12-21 MED ORDER — OXYCODONE HCL 5 MG PO TABS
5.0000 mg | ORAL_TABLET | ORAL | 0 refills | Status: DC | PRN
Start: 2021-12-21 — End: 2022-01-15

## 2021-12-21 MED ORDER — AMLODIPINE BESYLATE 5 MG PO TABS
5.0000 mg | ORAL_TABLET | Freq: Every day | ORAL | Status: DC
Start: 2021-12-22 — End: 2022-01-15

## 2021-12-21 MED ORDER — INSULIN ASPART PROT & ASPART (70-30 MIX) 100 UNIT/ML ~~LOC~~ SUSP: 10.0000 [IU] | Freq: Two times a day (BID) | SUBCUTANEOUS | 11 refills | Status: DC

## 2021-12-21 MED ORDER — GLUCERNA SHAKE PO LIQD: 237.0000 mL | Freq: Three times a day (TID) | ORAL | 0 refills | Status: AC

## 2021-12-21 MED ORDER — POLYETHYLENE GLYCOL 3350 17 G PO PACK: 17.0000 g | PACK | Freq: Every day | ORAL | 0 refills | Status: DC

## 2021-12-21 MED ORDER — DOCUSATE SODIUM 100 MG PO CAPS: 100.0000 mg | ORAL_CAPSULE | Freq: Two times a day (BID) | ORAL | 0 refills | Status: DC

## 2021-12-21 MED ORDER — METHOCARBAMOL 500 MG PO TABS: 500.0000 mg | ORAL_TABLET | Freq: Four times a day (QID) | ORAL | Status: AC | PRN

## 2021-12-21 MED ORDER — ALUM & MAG HYDROXIDE-SIMETH 200-200-20 MG/5ML PO SUSP: 30.0000 mL | ORAL | 0 refills | Status: AC | PRN

## 2021-12-21 MED ORDER — AMLODIPINE BESYLATE 5 MG PO TABS
5.0000 mg | ORAL_TABLET | Freq: Every day | ORAL | Status: DC
  Administered 2021-12-21: 5 mg via ORAL
  Filled 2021-12-21: qty 1

## 2021-12-21 MED ORDER — ACETAMINOPHEN 500 MG PO TABS: 1000.0000 mg | ORAL_TABLET | Freq: Three times a day (TID) | ORAL | 0 refills | Status: DC

## 2021-12-21 MED ORDER — SENNA 8.6 MG PO TABS: 1.0000 | ORAL_TABLET | Freq: Two times a day (BID) | ORAL | 0 refills | Status: DC

## 2021-12-21 MED ORDER — ADULT MULTIVITAMIN W/MINERALS CH: 1.0000 | ORAL_TABLET | Freq: Every day | ORAL | Status: AC

## 2021-12-21 NOTE — Discharge Summary (Addendum)
Physician Discharge Summary   Patient: Gilbert PINTOR Sr. MRN: [id number] DOB: 11-19-1940  Admit date:     12/14/2021  Discharge date: 12/21/2021  Discharge Physician: [name]   PCP: [name], MD   Recommendations at discharge:    Follow up orthopedic surgery Follow up with primary care Repeat CBC, BMP, Mg in 1-2 weeks   Discharge Diagnoses: Principal Problem:   Closed right hip fracture ([place]) Active Problems:   ABLA (acute blood loss anemia)   CKD (chronic kidney disease), stage IIIa   Hypokalemia   Hyponatremia   Hypomagnesemia   Dyslipidemia   Type II diabetes mellitus with renal manifestations (HCC)   Hypothyroidism   Parkinson disease ([place])  Resolved Problems:   Hypertensive urgency   Acute kidney injury superimposed on CKD [facility])  Hospital Course: 81 y.o. male with medical history significant for type 2 diabetes mellitus, essential hypertension, Parkinson's disease and stroke, who presented to the emergency room with acute onset of accidental mechanical fall with subsequent right hip pain."   Right hip x-ray showed intertrochanteric fracture of the right femoral neck. Orthopedic surgery was consulted.   Patient underwent ORIF with intramedullary nail on 8/22.  Hospital course complicated by acute blood loss anemia Received 1 unit pRBC transfusion.  Also give IV iron for iron deficiency. Needs CBC re-checked in 1 week.   8/28 -- pt's hemoglobin stable, medically stable for d/c to SNF/rehab today.   Assessment and Plan: * Closed right hip fracture [facility]) Orthopedic surgery consulted. Underwent ORIF with intramedullary nailing on 8/22. PT/OT evaluations - SNF/rehab at d/c. Pain control, bowel regimen. DVT prophylaxis - 81 mg ASA bid Follow up with Orthopedic surgery.   ABLA (acute blood loss anemia) Iron deficiency - chronic. Pre-op Hbg was 10.2.  On POD-1 Hbg down to 7.6, then down to 6.4 on POD2.   Transfused 1 unit pRBC's on 8/24. IV  iron infusion on 8/25. Hbg today is 8.0 stable --CBC in 1 week --transfuse if Hbg < 7.0 --monitor surgical site for signs of bleeding --start PO iron supplement once off pain medications (defer for now to avoid worsening constipation)  Hypomagnesemia Replaced for Mg 1.6 on 8/23 Monitor Mg and replace PRN.  Hyponatremia Na 129 on admission.  Family report poor PO intake recently.  Improved with IV fluids. Has been stable since off IV fluids. Na this AM 133.  --Monitor BMP --Encourage PO intake and hydration  Hypokalemia K replaced on admission. Monitor and replace PRN  CKD (chronic kidney disease), stage IIIa Baseline Cr appears ~1.1-1.2. Had very mild AKI in post-op setting which resolved. Monitor BMP.  Acute kidney injury superimposed on CKD (HCC)-resolved as of 12/20/2021 Cr on admission was at baseline 1.21, improved to 1.03 >> 1.43 >> 1.24 >> 1.00.  Treated initially with IV fluids.  Possible Cr increase was from transient hypotension during surgery.   --Monitor urine output --Bladder scan PRN to monitor for retention --Off IV fluids --Monitor BMP  Hypertensive urgency-resolved as of 12/20/2021 Continue home meds. PRN IV hydralazine  Pain control  Dyslipidemia Continue statin   Parkinson disease ([place]) Ccontinue Sinemet  Hypothyroidism Continue Synthroid  Type II diabetes mellitus with renal manifestations ([place]) With hyperglycemia --Increased 70/30 insulin to 10 units BID --Adjust insulin as needed --Close PCP follow up          Consultants: Orthopedic surgery  Procedures performed: ORIF with intramedullary nail   Disposition: Skilled nursing facility  Diet recommendation:  Discharge Diet Orders (From admission,  onward)     Start     Ordered   12/21/21 0000  Diet - low sodium heart healthy        12/21/21 1007           Carb modified diet DISCHARGE MEDICATION: Allergies as of 12/21/2021       Reactions   Metformin Diarrhea         Medication List     STOP taking these medications    HYDROcodone-acetaminophen 5-325 MG tablet Commonly known as: NORCO/VICODIN   lisinopril 40 MG tablet Commonly known as: ZESTRIL       TAKE these medications    acetaminophen 500 MG tablet Commonly known as: TYLENOL Take 2 tablets (1,000 mg total) by mouth every 8 (eight) hours. What changed:  medication strength how much to take when to take this reasons to take this   alum & mag hydroxide-simeth 299-371-69 MG/5ML suspension Commonly known as: MAALOX/MYLANTA Take 30 mLs by mouth every 4 (four) hours as needed for indigestion.   amLODipine 5 MG tablet Commonly known as: NORVASC Take 1 tablet (5 mg total) by mouth daily. Start taking on: December 22, 2021 What changed:  medication strength how much to take   ascorbic acid 500 MG tablet Commonly known as: VITAMIN C Take 1,000 mg by mouth daily.   aspirin EC 81 MG tablet Take 1 tablet (81 mg total) by mouth 2 (two) times daily. What changed: when to take this   atenolol 25 MG tablet Commonly known as: TENORMIN Take 25 mg by mouth daily.   atorvastatin 40 MG tablet Commonly known as: LIPITOR Take 40 mg by mouth daily.   carbidopa-levodopa 25-100 MG tablet Commonly known as: SINEMET IR Take 2 tablets by mouth 4 (four) times daily.   colchicine 0.6 MG tablet Take 1 tablet by mouth daily as needed.   divalproex 500 MG DR tablet Commonly known as: DEPAKOTE Take 500 mg by mouth 2 (two) times daily.   docusate sodium 100 MG capsule Commonly known as: COLACE Take 1 capsule (100 mg total) by mouth 2 (two) times daily.   feeding supplement (GLUCERNA SHAKE) Liqd Take 237 mLs by mouth 3 (three) times daily between meals.   hydrALAZINE 100 MG tablet Commonly known as: APRESOLINE Take 1 tablet (100 mg total) by mouth 3 (three) times daily. What changed:  how much to take when to take this   insulin aspart protamine- aspart (70-30) 100 UNIT/ML  injection Commonly known as: NOVOLOG MIX 70/30 Inject 0.1 mLs (10 Units total) into the skin 2 (two) times daily with a meal. What changed:  how much to take how to take this when to take this additional instructions   levothyroxine 75 MCG tablet Commonly known as: SYNTHROID Take 75 mcg by mouth daily before breakfast.   magnesium hydroxide 400 MG/5ML suspension Commonly known as: MILK OF MAGNESIA Take 30 mLs by mouth daily as needed for mild constipation or moderate constipation.   megestrol 20 MG tablet Commonly known as: MEGACE Take 20 mg by mouth daily.   methocarbamol 500 MG tablet Commonly known as: ROBAXIN Take 1 tablet (500 mg total) by mouth every 6 (six) hours as needed for muscle spasms.   multivitamin with minerals Tabs tablet Take 1 tablet by mouth daily. Start taking on: December 22, 2021   oxyCODONE 5 MG immediate release tablet Commonly known as: Oxy IR/ROXICODONE Take 1 tablet (5 mg total) by mouth every 4 (four) hours as needed for severe pain.  polyethylene glycol 17 g packet Commonly known as: MIRALAX / GLYCOLAX Take 17 g by mouth daily. Hold if loose or frequent stools   rivastigmine 1.5 MG capsule Commonly known as: EXELON Take 1.5 mg by mouth 2 (two) times daily.   senna 8.6 MG Tabs tablet Commonly known as: SENOKOT Take 1 tablet (8.6 mg total) by mouth 2 (two) times daily. Hold if loose or frequent stools               Discharge Care Instructions  (From admission, onward)           Start     Ordered   12/21/21 0000  Leave dressing on - Keep it clean, dry, and intact until clinic visit        12/21/21 1007            Contact information for after-discharge care     [place] [facility] Preferred SNF .   Service: Skilled Nursing Contact information: [place] [phone]                     Discharge Exam: Danley Danker Weights   12/14/21 1927 12/15/21 1040  Weight: 75.8 kg 75.8 kg   General exam: awake, alert, no acute distress HEENT: atraumatic, clear conjunctiva, anicteric sclera, moist mucus membranes, hearing grossly normal  Respiratory system: CTAB no wheezes, rales or rhonchi, normal respiratory effort. Cardiovascular system: normal S1/S2, RRR, no JVD, murmurs, rubs, gallops, no pedal edema.   Gastrointestinal system: soft, NT, ND, no HSM felt, +bowel sounds. Central nervous system: A&O x3. no gross focal neurologic deficits, normal speech Extremities: right lateral thigh honeycomb occlusive dressing intact with no surrounding erythema or swelling, no edema, normal tone Skin: dry, intact, normal temperature Psychiatry: normal mood, flat affect, judgement and insight appear normal   Condition at discharge: stable  The results of significant diagnostics from this hospitalization (including imaging, microbiology, ancillary and laboratory) are listed below for reference.   Imaging Studies: DG HIP UNILAT W OR W/O PELVIS 1V RIGHT  Result Date: 12/15/2021 CLINICAL DATA:  Operative fixation of a right hip fracture. EXAM: DG HIP (WITH OR WITHOUT PELVIS) 1V RIGHT COMPARISON:  C-arm images obtained earlier today routine images dated 12/14/2021. FINDINGS: Interval intramedullary rod and compression screw fixation of the previously demonstrated intertrochanteric fracture with anatomic position and alignment. No additional fractures or dislocation seen. IMPRESSION: Operative fixation of the right hip fracture with anatomic position and alignment. Electronically Signed   By: Claudie Revering M.D.   On: 12/15/2021 17:31   DG HIP UNILAT WITH PELVIS 2-3 VIEWS RIGHT  Result Date: 12/15/2021 CLINICAL DATA:  Right femoral intramedullary nail. EXAM: DG HIP (WITH OR WITHOUT PELVIS) 2-3V RIGHT; DG C-ARM 1-60 MIN COMPARISON:  Right hip radiographs 12/14/2021 FLUOROSCOPY: The device does not  provide the exposure index. Fluoroscopy Time:  1 minute 13 seconds. Number of Acquired Images:  5. FINDINGS: Intraoperative spot fluoroscopic images demonstrate ORIF of the previously shown intertrochanteric femur fracture with placement of an intramedullary nail and proximal and distal interlocking screws. Alignment is grossly anatomic on these limited images. IMPRESSION: Intraoperative images during ORIF of right femur fracture. Electronically Signed   By: Logan Bores M.D.   On: 12/15/2021 14:55   DG C-Arm 1-60 Min  Result Date: 12/15/2021 CLINICAL DATA:  Right femoral intramedullary nail. EXAM: DG HIP (WITH OR WITHOUT PELVIS) 2-3V RIGHT;  DG C-ARM 1-60 MIN COMPARISON:  Right hip radiographs 12/14/2021 FLUOROSCOPY: The device does not provide the exposure index. Fluoroscopy Time:  1 minute 13 seconds. Number of Acquired Images:  5. FINDINGS: Intraoperative spot fluoroscopic images demonstrate ORIF of the previously shown intertrochanteric femur fracture with placement of an intramedullary nail and proximal and distal interlocking screws. Alignment is grossly anatomic on these limited images. IMPRESSION: Intraoperative images during ORIF of right femur fracture. Electronically Signed   By: Logan Bores M.D.   On: 12/15/2021 14:55   DG Hip Unilat With Pelvis 2-3 Views Right  Result Date: 12/14/2021 CLINICAL DATA:  Fall and right hip pain. EXAM: DG HIP (WITH OR WITHOUT PELVIS) 2-3V RIGHT COMPARISON:  None Available. FINDINGS: Minimally displaced and comminuted appearing intertrochanteric fracture of the right femoral neck. No other acute fracture. There is no dislocation. The bones are osteopenic. Moderate arthritic changes of the hips. The soft tissues are unremarkable. IMPRESSION: Intertrochanteric fracture of the right femoral neck. Electronically Signed   By: Anner Crete M.D.   On: 12/14/2021 20:00    Microbiology: Results for orders placed or performed during the hospital encounter of 12/14/21   MRSA Next Gen by PCR, Nasal     Status: None   Collection Time: 12/15/21  3:33 AM   Specimen: Nasal Mucosa; Nasal Swab  Result Value Ref Range Status   MRSA by PCR Next Gen NOT DETECTED NOT DETECTED Final    Comment: (NOTE) The GeneXpert MRSA Assay (FDA approved for NASAL specimens only), is one component of a comprehensive MRSA colonization surveillance program. It is not intended to diagnose MRSA infection nor to guide or monitor treatment for MRSA infections. Test performance is not FDA approved in patients less than 67 years old. Performed at [facility], [address] [place], [place] [address]     Labs: CBC: Recent Labs  Lab 12/14/21 1936 12/15/21 0302 12/16/21 4496 12/16/21 1508 12/17/21 0513 12/17/21 1229 12/18/21 0351 12/18/21 1602 12/19/21 0414 12/19/21 1253 12/20/21 0756 12/21/21 0915  WBC 7.2   < > 8.5  --  8.2  --  8.0  --  7.6  --  6.9  --   NEUTROABS 5.2  --   --   --   --   --   --   --   --   --   --   --   HGB 11.6*   < > 7.6*   < > 6.4*   < > 7.3* 8.1* 7.2* 7.9* 7.6* 8.0*  HCT 34.1*   < > 22.5*   < > 19.1*   < > 21.5* 24.2* 21.3* 23.7* 23.1* 24.4*  MCV 89.0   < > 90.7  --  91.0  --  89.2  --  90.3  --  90.2  --   PLT 147*   < > 109*  --  107*  --  106*  --  133*  --  156  --    < > = values in this interval not displayed.   Basic Metabolic Panel: Recent Labs  Lab 12/15/21 0302 12/16/21 0626 12/17/21 0513 12/18/21 0351 12/20/21 0756  NA 130* 132* 132* 132* 133*  K 3.0* 3.7 3.4* 3.8 4.4  CL 99 102 103 102 104  CO2 24 23 24 24 25   GLUCOSE 187* 266* 176* 175* 134*  BUN 17 21 23 19 19   CREATININE 1.03 1.42* 1.24 1.00 1.09  CALCIUM 7.8* 7.6* 7.7* 7.5* 7.8*  MG  --  1.6* 2.4 1.9  --    Liver Function Tests: Recent Labs  Lab 12/14/21 1936  AST 29  ALT 17  ALKPHOS 70  BILITOT 1.0  PROT 6.0*  ALBUMIN 3.2*   CBG: Recent Labs  Lab 12/20/21 1135 12/20/21 1611 12/21/21 0632 12/21/21 0826 12/21/21 1126  GLUCAP 265* 204*  156* 174* 209*    Discharge time spent: greater than 30 minutes.  Signed: [name], DO [facility] 12/21/2021

## 2021-12-21 NOTE — Progress Notes (Signed)
Physical Therapy Treatment Patient Details Name: LESHAWN STRAKA Sr. MRN: [id number] DOB: [birth year] Today's Date: 12/21/2021   History of Present Illness QUATAVIOUS ROSSA Sr. is a 81 y.o. Caucasian male with medical history significant for type 2 diabetes mellitus, essential hypertension, Parkinson's disease and stroke, who presented to the emergency room with acute onset of accidental mechanical fall with subsequent right hip pain.  The patient was getting out of a car and lost his balance with subsequent fall on his right side.  He denied any headache or dizziness or blurred vision. S/P R hip ORIF.    PT Comments    Pt on commode upon arrival trying to have BM.  Unsuccessful.  He is given more time and I did stay in the room with him per his request as he stated he did not like to be left alone.  Room tidies.  He asks to call wife and assisted with phone where he leaves a message for her asking for her to come help him up off the commode.  Pt told that I was here to help and a tech called to assist.  He stood with mod a x 2 and commode is removed and recliner pulled up behind him.  Pt does not stand fully and remains crouched with heavy reliance on walker and sits quickly in recliner.  Pt positioned for comfort and seems generally irritated today.  Orientated to call bell and confirmed it worked as pt stated he kept pressing it with no help.  Light was not on when I went into the room.  Unsure if pt has some confusion vs general irritation with situation.   Recommendations for follow up therapy are one component of a multi-disciplinary discharge planning process, led by the attending physician.  Recommendations may be updated based on patient status, additional functional criteria and insurance authorization.  Follow Up Recommendations  Skilled nursing-short term rehab (<3 hours/day)     Assistance Recommended at Discharge Frequent or constant Supervision/Assistance  Patient can return home  with the following Two people to help with walking and/or transfers;A lot of help with bathing/dressing/bathroom;Assistance with cooking/housework;Direct supervision/assist for medications management;Direct supervision/assist for financial management;Assist for transportation;Help with stairs or ramp for entrance   Equipment Recommendations       Recommendations for Other Services       Precautions / Restrictions Precautions Precautions: Fall Restrictions Weight Bearing Restrictions: Yes RLE Weight Bearing: Weight bearing as tolerated     Mobility  Bed Mobility               General bed mobility comments: up on commode upon arrival and placed in chair after    Transfers Overall transfer level: Needs assistance Equipment used: Rolling walker (2 wheels) Transfers: Sit to/from Stand Sit to Stand: Mod assist, +2 physical assistance           General transfer comment: stood from commode, removed and then pulled recliner up behind him.  pt was not standing fully and able to take any effective steps    Ambulation/Gait               General Gait Details: stood but no gait today   Stairs             Wheelchair Mobility    Modified Rankin (Stroke Patients Only)       Balance Overall balance assessment: Needs assistance, History of Falls Sitting-balance support: Bilateral upper extremity supported, Feet supported Sitting balance-Leahy Scale: Good  Standing balance support: During functional activity, Bilateral upper extremity supported, Reliant on assistive device for balance Standing balance-Leahy Scale: Poor Standing balance comment: Heavy reliance on the RW for support in static standing.  anxious and fearful of falling                            Cognition Arousal/Alertness: Awake/alert Behavior During Therapy: WFL for tasks assessed/performed Overall Cognitive Status: Within Functional Limits for tasks assessed                                           Exercises      General Comments        Pertinent Vitals/Pain Pain Assessment Pain Assessment: Faces Faces Pain Scale: Hurts even more Pain Location: R LE Pain Descriptors / Indicators: Grimacing, Operative site guarding, Sore Pain Intervention(s): Limited activity within patient's tolerance, Monitored during session, Premedicated before session, Repositioned    Home Living                          Prior Function            PT Goals (current goals can now be found in the care plan section) Progress towards PT goals: Progressing toward goals    Frequency    BID      PT Plan Current plan remains appropriate    Co-evaluation              AM-PAC PT "6 Clicks" Mobility   Outcome Measure  Help needed turning from your back to your side while in a flat bed without using bedrails?: A Lot Help needed moving from lying on your back to sitting on the side of a flat bed without using bedrails?: A Lot Help needed moving to and from a bed to a chair (including a wheelchair)?: A Lot Help needed standing up from a chair using your arms (e.g., wheelchair or bedside chair)?: A Lot Help needed to walk in hospital room?: Total Help needed climbing 3-5 steps with a railing? : Total 6 Click Score: 10    End of Session Equipment Utilized During Treatment: Gait belt Activity Tolerance: Patient limited by fatigue;Patient limited by pain Patient left: in chair;with call bell/phone within reach;with chair alarm set Nurse Communication: Mobility status PT Visit Diagnosis: Unsteadiness on feet (R26.81);Other abnormalities of gait and mobility (R26.89);Muscle weakness (generalized) (M62.81);Difficulty in walking, not elsewhere classified (R26.2);History of falling (Z91.81);Pain Pain - Right/Left: Right Pain - part of body: Leg     Time: 9381-0175 PT Time Calculation (min) (ACUTE ONLY): 11 min  Charges:  $Therapeutic Activity:  8-22 mins                   Chesley Noon, PTA 12/21/21, 10:34 AM

## 2021-12-21 NOTE — Progress Notes (Signed)
  Subjective:  POD #6 s/p intramedullary fixation for right intertrochanteric hip fracture.   Patient reports right hip pain as mild.  Patient sitting up in a chair.  Objective:   VITALS:   Vitals:   12/20/21 1610 12/20/21 1942 12/21/21 0414 12/21/21 0837  BP: (!) 135/56 (!) 151/66 (!) 151/53 (!) 142/55  Pulse: 67 83 69 70  Resp: 17 16 18 17   Temp: 98.1 F (36.7 C) 98.2 F (36.8 C) 98.5 F (36.9 C) 97.9 F (36.6 C)  TempSrc:      SpO2: 97% 98% 96% 96%  Weight:      Height:        PHYSICAL EXAM: Right lower extremity Neurovascular intact Sensation intact distally Intact pulses distally Dorsiflexion/Plantar flexion intact Incision: Patient has a small amount of old sanguinous drainage at the superior end of his honeycomb dressing.  There is no active bleeding seen. No cellulitis present Compartment soft  LABS  Results for orders placed or performed during the hospital encounter of 12/14/21 (from the past 24 hour(s))  Glucose, capillary     Status: Abnormal   Collection Time: 12/20/21  4:11 PM  Result Value Ref Range   Glucose-Capillary 204 (H) 70 - 99 mg/dL  Glucose, capillary     Status: Abnormal   Collection Time: 12/21/21  6:32 AM  Result Value Ref Range   Glucose-Capillary 156 (H) 70 - 99 mg/dL  Glucose, capillary     Status: Abnormal   Collection Time: 12/21/21  8:26 AM  Result Value Ref Range   Glucose-Capillary 174 (H) 70 - 99 mg/dL  Hemoglobin and hematocrit, blood     Status: Abnormal   Collection Time: 12/21/21  9:15 AM  Result Value Ref Range   Hemoglobin 8.0 (L) 13.0 - 17.0 g/dL   HCT 24.4 (L) 39.0 - 52.0 %  Glucose, capillary     Status: Abnormal   Collection Time: 12/21/21 11:26 AM  Result Value Ref Range   Glucose-Capillary 209 (H) 70 - 99 mg/dL    No results found.  Assessment/Plan: 6 Days Post-Op   Principal Problem:   Closed right hip fracture (HCC) Active Problems:   Type II diabetes mellitus with renal manifestations (HCC)    Hypothyroidism   Parkinson disease (HCC)   CKD (chronic kidney disease), stage IIIa   Dyslipidemia   Hypokalemia   Hyponatremia   Hypomagnesemia   ABLA (acute blood loss anemia)  Patient stable from an orthopedic standpoint.  Patient's hemoglobin has stabilized.  Patient is being transferred to a skilled nursing facility.  He is weightbearing as tolerated on the right lower extremity.  Patient will follow-up in my office in approximately 10 days for wound check, staple removal and repeat x-ray.  Continue physical and occupational therapy at skilled nursing facility.  Continue blood sugar control.  She will take 81 mg aspirin p.o. twice daily for DVT prophylaxis until his return.    Thornton Park , MD 12/21/2021, 12:54 PM

## 2021-12-21 NOTE — Care Management Important Message (Signed)
Important Message  Patient Details  Name: Gilbert HEIBERGER Sr. MRN: [id number] Date of Birth: 05-16-1940   Medicare Important Message Given:  Yes     [name] 12/21/2021, 12:43 PM

## 2021-12-21 NOTE — TOC Progression Note (Signed)
Transition of Care (TOC) - Progression Note    Patient Details  Name: Gilbert PIGNATO Sr. MRN: [id number] Date of Birth: [birth year]  Transition of Care [facility]) CM/SW [place], RN Phone Number: 12/21/2021, 11:03 AM  Clinical Narrative:     Was able to reach the patient's wife, She is aware of the room number 949 at [facility] called to transport       Expected Discharge Plan and Services           Expected Discharge Date: 12/21/21                                     Social Determinants of Health (SDOH) Interventions    Readmission Risk Interventions     No data to display

## 2021-12-21 NOTE — Plan of Care (Signed)
  Problem: Education: Goal: Knowledge of General Education information will improve Description: Including pain rating scale, medication(s)/side effects and non-pharmacologic comfort measures Outcome: Progressing   Problem: Health Behavior/Discharge Planning: Goal: Ability to manage health-related needs will improve Outcome: Progressing   Problem: Clinical Measurements: Goal: Will remain free from infection Outcome: Progressing   Problem: Activity: Goal: Risk for activity intolerance will decrease Outcome: Not Progressing   Problem: Nutrition: Goal: Adequate nutrition will be maintained Outcome: Progressing   Problem: Coping: Goal: Level of anxiety will decrease Outcome: Progressing   

## 2021-12-21 NOTE — Plan of Care (Signed)
Report called to [employer]) via telephone [phone])  Problem: Education: Goal: Knowledge of General Education information will improve Description: Including pain rating scale, medication(s)/side effects and non-pharmacologic comfort measures Outcome: Adequate for Discharge   Problem: Health Behavior/Discharge Planning: Goal: Ability to manage health-related needs will improve Outcome: Adequate for Discharge   Problem: Clinical Measurements: Goal: Ability to maintain clinical measurements within normal limits will improve Outcome: Adequate for Discharge Goal: Will remain free from infection Outcome: Adequate for Discharge Goal: Diagnostic test results will improve Outcome: Adequate for Discharge Goal: Respiratory complications will improve Outcome: Adequate for Discharge Goal: Cardiovascular complication will be avoided Outcome: Adequate for Discharge   Problem: Activity: Goal: Risk for activity intolerance will decrease Outcome: Adequate for Discharge   Problem: Nutrition: Goal: Adequate nutrition will be maintained Outcome: Adequate for Discharge   Problem: Coping: Goal: Level of anxiety will decrease Outcome: Adequate for Discharge   Problem: Elimination: Goal: Will not experience complications related to bowel motility Outcome: Adequate for Discharge Goal: Will not experience complications related to urinary retention Outcome: Adequate for Discharge   Problem: Pain Managment: Goal: General experience of comfort will improve Outcome: Adequate for Discharge   Problem: Safety: Goal: Ability to remain free from injury will improve Outcome: Adequate for Discharge   Problem: Skin Integrity: Goal: Risk for impaired skin integrity will decrease Outcome: Adequate for Discharge

## 2021-12-21 NOTE — TOC Progression Note (Signed)
Transition of Care (TOC) - Progression Note    Patient Details  Name: Gilbert SMAIL Sr. MRN: [id number] Date of Birth: 02/25/1941  Transition of Care [facility]) CM/SW [place], RN Phone Number: 12/21/2021, 10:58 AM  Clinical Narrative:     The patient to go to room 402 at [facility] today, Attempted to reach the patient's daughter Gilbert [name] to make aware as well as Wife left a general VM for both for a call back       Expected Discharge Plan and Services           Expected Discharge Date: 12/21/21                                     Social Determinants of Health (SDOH) Interventions    Readmission Risk Interventions     No data to display

## 2021-12-21 NOTE — Progress Notes (Signed)
Patient declined turning and repositioning throughout the night. The plan of care and importance of mobility was discussed with the patient. He acknowledges that he should be more mobile but indicate that "it hurts".

## 2021-12-30 DIAGNOSIS — S72001A Fracture of unspecified part of neck of right femur, initial encounter for closed fracture: Secondary | ICD-10-CM | POA: Diagnosis not present

## 2021-12-31 ENCOUNTER — Other Ambulatory Visit: Payer: Self-pay | Admitting: Internal Medicine

## 2021-12-31 DIAGNOSIS — R109 Unspecified abdominal pain: Secondary | ICD-10-CM

## 2021-12-31 DIAGNOSIS — R634 Abnormal weight loss: Secondary | ICD-10-CM

## 2022-01-01 ENCOUNTER — Other Ambulatory Visit: Payer: Self-pay | Admitting: Internal Medicine

## 2022-01-01 DIAGNOSIS — R1084 Generalized abdominal pain: Secondary | ICD-10-CM

## 2022-01-01 DIAGNOSIS — R634 Abnormal weight loss: Secondary | ICD-10-CM

## 2022-01-10 ENCOUNTER — Inpatient Hospital Stay
Admission: EM | Admit: 2022-01-10 | Discharge: 2022-01-15 | DRG: 177 | Disposition: A | Discharge status: Skilled Nursing Facility | Payer: HMO | Source: Skilled Nursing Facility | Attending: Internal Medicine | Admitting: Internal Medicine

## 2022-01-10 ENCOUNTER — Other Ambulatory Visit: Payer: Self-pay

## 2022-01-10 ENCOUNTER — Inpatient Hospital Stay: Payer: HMO

## 2022-01-10 ENCOUNTER — Emergency Department: Payer: HMO

## 2022-01-10 DIAGNOSIS — W1830XD Fall on same level, unspecified, subsequent encounter: Secondary | ICD-10-CM

## 2022-01-10 DIAGNOSIS — D62 Acute posthemorrhagic anemia: Secondary | ICD-10-CM | POA: Diagnosis not present

## 2022-01-10 DIAGNOSIS — I700 Atherosclerosis of aorta: Secondary | ICD-10-CM | POA: Diagnosis not present

## 2022-01-10 DIAGNOSIS — N1831 Chronic kidney disease, stage 3a: Secondary | ICD-10-CM | POA: Diagnosis present

## 2022-01-10 DIAGNOSIS — E119 Type 2 diabetes mellitus without complications: Secondary | ICD-10-CM

## 2022-01-10 DIAGNOSIS — E785 Hyperlipidemia, unspecified: Secondary | ICD-10-CM | POA: Diagnosis present

## 2022-01-10 DIAGNOSIS — J690 Pneumonitis due to inhalation of food and vomit: Secondary | ICD-10-CM | POA: Diagnosis present

## 2022-01-10 DIAGNOSIS — I2510 Atherosclerotic heart disease of native coronary artery without angina pectoris: Secondary | ICD-10-CM | POA: Diagnosis present

## 2022-01-10 DIAGNOSIS — R197 Diarrhea, unspecified: Secondary | ICD-10-CM | POA: Diagnosis not present

## 2022-01-10 DIAGNOSIS — Z79899 Other long term (current) drug therapy: Secondary | ICD-10-CM

## 2022-01-10 DIAGNOSIS — Z794 Long term (current) use of insulin: Secondary | ICD-10-CM

## 2022-01-10 DIAGNOSIS — Z7189 Other specified counseling: Secondary | ICD-10-CM

## 2022-01-10 DIAGNOSIS — Z8616 Personal history of COVID-19: Secondary | ICD-10-CM | POA: Diagnosis not present

## 2022-01-10 DIAGNOSIS — Z20822 Contact with and (suspected) exposure to covid-19: Secondary | ICD-10-CM | POA: Diagnosis not present

## 2022-01-10 DIAGNOSIS — E1129 Type 2 diabetes mellitus with other diabetic kidney complication: Secondary | ICD-10-CM | POA: Diagnosis present

## 2022-01-10 DIAGNOSIS — Z888 Allergy status to other drugs, medicaments and biological substances status: Secondary | ICD-10-CM

## 2022-01-10 DIAGNOSIS — R531 Weakness: Secondary | ICD-10-CM

## 2022-01-10 DIAGNOSIS — Z79890 Hormone replacement therapy (postmenopausal): Secondary | ICD-10-CM | POA: Diagnosis not present

## 2022-01-10 DIAGNOSIS — G20 Parkinson's disease: Secondary | ICD-10-CM | POA: Diagnosis present

## 2022-01-10 DIAGNOSIS — N3289 Other specified disorders of bladder: Secondary | ICD-10-CM | POA: Diagnosis not present

## 2022-01-10 DIAGNOSIS — I5033 Acute on chronic diastolic (congestive) heart failure: Secondary | ICD-10-CM | POA: Diagnosis present

## 2022-01-10 DIAGNOSIS — E871 Hypo-osmolality and hyponatremia: Secondary | ICD-10-CM | POA: Diagnosis present

## 2022-01-10 DIAGNOSIS — S72001D Fracture of unspecified part of neck of right femur, subsequent encounter for closed fracture with routine healing: Secondary | ICD-10-CM | POA: Diagnosis not present

## 2022-01-10 DIAGNOSIS — E039 Hypothyroidism, unspecified: Secondary | ICD-10-CM | POA: Diagnosis present

## 2022-01-10 DIAGNOSIS — J189 Pneumonia, unspecified organism: Secondary | ICD-10-CM | POA: Diagnosis not present

## 2022-01-10 DIAGNOSIS — E1122 Type 2 diabetes mellitus with diabetic chronic kidney disease: Secondary | ICD-10-CM | POA: Diagnosis not present

## 2022-01-10 DIAGNOSIS — I5021 Acute systolic (congestive) heart failure: Secondary | ICD-10-CM | POA: Diagnosis not present

## 2022-01-10 DIAGNOSIS — I5023 Acute on chronic systolic (congestive) heart failure: Secondary | ICD-10-CM | POA: Diagnosis not present

## 2022-01-10 DIAGNOSIS — I429 Cardiomyopathy, unspecified: Secondary | ICD-10-CM | POA: Diagnosis not present

## 2022-01-10 DIAGNOSIS — N179 Acute kidney failure, unspecified: Secondary | ICD-10-CM | POA: Diagnosis not present

## 2022-01-10 DIAGNOSIS — I420 Dilated cardiomyopathy: Secondary | ICD-10-CM

## 2022-01-10 DIAGNOSIS — E876 Hypokalemia: Secondary | ICD-10-CM | POA: Diagnosis present

## 2022-01-10 DIAGNOSIS — Z515 Encounter for palliative care: Secondary | ICD-10-CM | POA: Diagnosis not present

## 2022-01-10 DIAGNOSIS — J9811 Atelectasis: Secondary | ICD-10-CM | POA: Diagnosis not present

## 2022-01-10 DIAGNOSIS — I110 Hypertensive heart disease with heart failure: Secondary | ICD-10-CM | POA: Diagnosis not present

## 2022-01-10 DIAGNOSIS — E1121 Type 2 diabetes mellitus with diabetic nephropathy: Secondary | ICD-10-CM | POA: Diagnosis not present

## 2022-01-10 DIAGNOSIS — I130 Hypertensive heart and chronic kidney disease with heart failure and stage 1 through stage 4 chronic kidney disease, or unspecified chronic kidney disease: Secondary | ICD-10-CM | POA: Diagnosis not present

## 2022-01-10 DIAGNOSIS — R54 Age-related physical debility: Secondary | ICD-10-CM | POA: Diagnosis present

## 2022-01-10 DIAGNOSIS — R059 Cough, unspecified: Secondary | ICD-10-CM | POA: Diagnosis not present

## 2022-01-10 DIAGNOSIS — I509 Heart failure, unspecified: Secondary | ICD-10-CM

## 2022-01-10 DIAGNOSIS — I10 Essential (primary) hypertension: Secondary | ICD-10-CM | POA: Diagnosis present

## 2022-01-10 DIAGNOSIS — E038 Other specified hypothyroidism: Secondary | ICD-10-CM | POA: Diagnosis not present

## 2022-01-10 DIAGNOSIS — R634 Abnormal weight loss: Secondary | ICD-10-CM | POA: Diagnosis present

## 2022-01-10 DIAGNOSIS — Z7982 Long term (current) use of aspirin: Secondary | ICD-10-CM

## 2022-01-10 DIAGNOSIS — I69320 Aphasia following cerebral infarction: Secondary | ICD-10-CM | POA: Diagnosis not present

## 2022-01-10 DIAGNOSIS — N183 Chronic kidney disease, stage 3 unspecified: Secondary | ICD-10-CM | POA: Diagnosis present

## 2022-01-10 DIAGNOSIS — J90 Pleural effusion, not elsewhere classified: Secondary | ICD-10-CM | POA: Diagnosis not present

## 2022-01-10 LAB — COMPREHENSIVE METABOLIC PANEL
ALT: <5 U/L (ref 0–44)
AST: 39 U/L (ref 15–41)
Albumin: 2.7 g/dL — ABNORMAL LOW (ref 3.5–5.0)
Alkaline Phosphatase: 109 U/L (ref 38–126)
Anion gap: 11 (ref 5–15)
BUN: 17 mg/dL (ref 8–23)
CO2: 20 mmol/L — ABNORMAL LOW (ref 22–32)
Calcium: 8 mg/dL — ABNORMAL LOW (ref 8.9–10.3)
Chloride: 100 mmol/L (ref 98–111)
Creatinine, Ser: 1.01 mg/dL (ref 0.61–1.24)
GFR, Estimated: >60 mL/min (ref >60)
Glucose, Bld: 153 mg/dL — ABNORMAL HIGH (ref 70–99)
Potassium: 5.5 mmol/L — ABNORMAL HIGH (ref 3.5–5.1)
Sodium: 131 mmol/L — ABNORMAL LOW (ref 135–145)
Total Bilirubin: 1.4 mg/dL — ABNORMAL HIGH (ref 0.3–1.2)
Total Protein: 6.2 g/dL — ABNORMAL LOW (ref 6.5–8.1)

## 2022-01-10 LAB — CBC WITH DIFFERENTIAL/PLATELET
Abs Immature Granulocytes: 0.05 10*3/uL (ref 0.00–0.07)
Basophils Absolute: 0 10*3/uL (ref 0.0–0.1)
Basophils Relative: 0 %
Eosinophils Absolute: 0 10*3/uL (ref 0.0–0.5)
Eosinophils Relative: 0 %
HCT: 31 % — ABNORMAL LOW (ref 39.0–52.0)
Hemoglobin: 9.7 g/dL — ABNORMAL LOW (ref 13.0–17.0)
Immature Granulocytes: 1 %
Lymphocytes Relative: 20 %
Lymphs Abs: 2 10*3/uL (ref 0.7–4.0)
MCH: 30.5 pg (ref 26.0–34.0)
MCHC: 31.3 g/dL (ref 30.0–36.0)
MCV: 97.5 fL (ref 80.0–100.0)
Monocytes Absolute: 0.6 10*3/uL (ref 0.1–1.0)
Monocytes Relative: 6 %
Neutro Abs: 7 10*3/uL (ref 1.7–7.7)
Neutrophils Relative %: 73 %
Platelets: 268 10*3/uL (ref 150–400)
RBC: 3.18 MIL/uL — ABNORMAL LOW (ref 4.22–5.81)
RDW: 16.6 % — ABNORMAL HIGH (ref 11.5–15.5)
WBC: 9.6 10*3/uL (ref 4.0–10.5)
nRBC: 0 % (ref 0.0–0.2)

## 2022-01-10 LAB — SARS CORONAVIRUS 2 BY RT PCR: SARS Coronavirus 2 by RT PCR: NEGATIVE

## 2022-01-10 LAB — PROCALCITONIN: Procalcitonin: <0.1 ng/mL

## 2022-01-10 LAB — GLUCOSE, CAPILLARY
Glucose-Capillary: 142 mg/dL — ABNORMAL HIGH (ref 70–99)
Glucose-Capillary: 135 mg/dL — ABNORMAL HIGH (ref 70–99)

## 2022-01-10 LAB — BRAIN NATRIURETIC PEPTIDE: B Natriuretic Peptide: 1827.4 pg/mL — ABNORMAL HIGH (ref 0.0–100.0)

## 2022-01-10 LAB — TROPONIN I (HIGH SENSITIVITY): Troponin I (High Sensitivity): 39 ng/L — ABNORMAL HIGH (ref <18)

## 2022-01-10 LAB — TSH: TSH: 3.402 u[IU]/mL (ref 0.350–4.500)

## 2022-01-10 MED ORDER — DIVALPROEX SODIUM 500 MG PO DR TAB
500.0000 mg | DELAYED_RELEASE_TABLET | Freq: Two times a day (BID) | ORAL | Status: DC
  Administered 2022-01-10 – 2022-01-15 (×10): 500 mg via ORAL
  Filled 2022-01-10 (×10): qty 1

## 2022-01-10 MED ORDER — SODIUM CHLORIDE 0.9 % IV SOLN: 250.0000 mL | INTRAVENOUS | Status: DC | PRN

## 2022-01-10 MED ORDER — CARBIDOPA-LEVODOPA 25-100 MG PO TABS
2.0000 | ORAL_TABLET | Freq: Four times a day (QID) | ORAL | Status: DC
  Administered 2022-01-10 – 2022-01-15 (×18): 2 via ORAL
  Filled 2022-01-10 (×19): qty 2

## 2022-01-10 MED ORDER — ATENOLOL 25 MG PO TABS
25.0000 mg | ORAL_TABLET | Freq: Every day | ORAL | Status: DC
  Administered 2022-01-11 – 2022-01-12 (×2): 25 mg via ORAL
  Filled 2022-01-10 (×2): qty 1

## 2022-01-10 MED ORDER — SODIUM CHLORIDE 0.9% FLUSH
3.0000 mL | Freq: Two times a day (BID) | INTRAVENOUS | Status: DC
  Administered 2022-01-10 – 2022-01-11 (×3): 3 mL via INTRAVENOUS

## 2022-01-10 MED ORDER — GLUCERNA SHAKE PO LIQD
237.0000 mL | Freq: Three times a day (TID) | ORAL | Status: DC
  Administered 2022-01-10 – 2022-01-15 (×8): 237 mL via ORAL

## 2022-01-10 MED ORDER — INSULIN ASPART 100 UNIT/ML IJ SOLN
0.0000 [IU] | Freq: Three times a day (TID) | INTRAMUSCULAR | Status: DC
  Administered 2022-01-11: 3 [IU] via SUBCUTANEOUS
  Administered 2022-01-11: 2 [IU] via SUBCUTANEOUS
  Administered 2022-01-11: 5 [IU] via SUBCUTANEOUS
  Administered 2022-01-12 – 2022-01-13 (×5): 3 [IU] via SUBCUTANEOUS
  Administered 2022-01-13: 5 [IU] via SUBCUTANEOUS
  Administered 2022-01-14 (×2): 3 [IU] via SUBCUTANEOUS
  Administered 2022-01-14: 5 [IU] via SUBCUTANEOUS
  Administered 2022-01-15 (×2): 3 [IU] via SUBCUTANEOUS
  Filled 2022-01-10 (×13): qty 1

## 2022-01-10 MED ORDER — IRBESARTAN 75 MG PO TABS
37.5000 mg | ORAL_TABLET | Freq: Every day | ORAL | Status: DC
  Administered 2022-01-10 – 2022-01-12 (×3): 37.5 mg via ORAL
  Filled 2022-01-10 (×4): qty 0.5

## 2022-01-10 MED ORDER — DOCUSATE SODIUM 100 MG PO CAPS
100.0000 mg | ORAL_CAPSULE | Freq: Two times a day (BID) | ORAL | Status: DC
  Administered 2022-01-10 – 2022-01-12 (×5): 100 mg via ORAL
  Filled 2022-01-10 (×7): qty 1

## 2022-01-10 MED ORDER — HYDRALAZINE HCL 50 MG PO TABS
50.0000 mg | ORAL_TABLET | Freq: Two times a day (BID) | ORAL | Status: DC
  Administered 2022-01-10 – 2022-01-11 (×3): 50 mg via ORAL
  Filled 2022-01-10 (×4): qty 1

## 2022-01-10 MED ORDER — SODIUM CHLORIDE 0.9 % IV SOLN: 1.0000 g | INTRAVENOUS | Status: DC

## 2022-01-10 MED ORDER — SODIUM CHLORIDE 0.9% FLUSH: 3.0000 mL | INTRAVENOUS | Status: DC | PRN

## 2022-01-10 MED ORDER — SODIUM CHLORIDE 0.9 % IV SOLN
500.0000 mg | INTRAVENOUS | Status: DC
  Filled 2022-01-10: qty 5

## 2022-01-10 MED ORDER — FUROSEMIDE 10 MG/ML IJ SOLN
40.0000 mg | Freq: Once | INTRAMUSCULAR | Status: AC
  Administered 2022-01-10: 40 mg via INTRAVENOUS
  Filled 2022-01-10: qty 4

## 2022-01-10 MED ORDER — SODIUM CHLORIDE 0.9 % IV SOLN
500.0000 mg | Freq: Once | INTRAVENOUS | Status: AC
  Administered 2022-01-10: 500 mg via INTRAVENOUS
  Filled 2022-01-10: qty 5

## 2022-01-10 MED ORDER — ACETAMINOPHEN 500 MG PO TABS
1000.0000 mg | ORAL_TABLET | Freq: Three times a day (TID) | ORAL | Status: DC
  Administered 2022-01-10 – 2022-01-14 (×12): 1000 mg via ORAL
  Filled 2022-01-10 (×14): qty 2

## 2022-01-10 MED ORDER — ASPIRIN 81 MG PO TBEC
81.0000 mg | DELAYED_RELEASE_TABLET | Freq: Two times a day (BID) | ORAL | Status: DC
  Administered 2022-01-10 – 2022-01-15 (×10): 81 mg via ORAL
  Filled 2022-01-10 (×10): qty 1

## 2022-01-10 MED ORDER — SODIUM CHLORIDE 0.9 % IV SOLN
1.0000 g | INTRAVENOUS | Status: DC
  Filled 2022-01-10: qty 10

## 2022-01-10 MED ORDER — DAPAGLIFLOZIN PROPANEDIOL 10 MG PO TABS
10.0000 mg | ORAL_TABLET | Freq: Every day | ORAL | Status: DC
  Administered 2022-01-11 – 2022-01-15 (×5): 10 mg via ORAL
  Filled 2022-01-10 (×6): qty 1

## 2022-01-10 MED ORDER — MEGESTROL ACETATE 20 MG PO TABS
20.0000 mg | ORAL_TABLET | Freq: Every day | ORAL | Status: DC
  Administered 2022-01-11 – 2022-01-15 (×5): 20 mg via ORAL
  Filled 2022-01-10 (×5): qty 1

## 2022-01-10 MED ORDER — POLYETHYLENE GLYCOL 3350 17 G PO PACK
17.0000 g | PACK | Freq: Every day | ORAL | Status: DC
  Administered 2022-01-11 – 2022-01-12 (×2): 17 g via ORAL
  Filled 2022-01-10 (×4): qty 1

## 2022-01-10 MED ORDER — ATORVASTATIN CALCIUM 20 MG PO TABS
40.0000 mg | ORAL_TABLET | Freq: Every day | ORAL | Status: DC
  Administered 2022-01-11 – 2022-01-15 (×5): 40 mg via ORAL
  Filled 2022-01-10 (×5): qty 2

## 2022-01-10 MED ORDER — ONDANSETRON HCL 4 MG/2ML IJ SOLN: 4.0000 mg | Freq: Four times a day (QID) | INTRAMUSCULAR | Status: DC | PRN

## 2022-01-10 MED ORDER — ACETAMINOPHEN 325 MG PO TABS: 650.0000 mg | ORAL_TABLET | ORAL | Status: DC | PRN

## 2022-01-10 MED ORDER — SODIUM CHLORIDE 0.9 % IV SOLN: 500.0000 mg | INTRAVENOUS | Status: DC

## 2022-01-10 MED ORDER — ORAL CARE MOUTH RINSE: 15.0000 mL | OROMUCOSAL | Status: DC | PRN

## 2022-01-10 MED ORDER — ENOXAPARIN SODIUM 40 MG/0.4ML IJ SOSY
40.0000 mg | PREFILLED_SYRINGE | INTRAMUSCULAR | Status: DC
  Administered 2022-01-10 – 2022-01-14 (×5): 40 mg via SUBCUTANEOUS
  Filled 2022-01-10 (×5): qty 0.4

## 2022-01-10 MED ORDER — FUROSEMIDE 10 MG/ML IJ SOLN
40.0000 mg | Freq: Four times a day (QID) | INTRAMUSCULAR | Status: AC
  Administered 2022-01-10 – 2022-01-11 (×4): 40 mg via INTRAVENOUS
  Filled 2022-01-10 (×4): qty 4

## 2022-01-10 MED ORDER — AMLODIPINE BESYLATE 5 MG PO TABS
5.0000 mg | ORAL_TABLET | Freq: Every day | ORAL | Status: DC
  Administered 2022-01-11 – 2022-01-12 (×2): 5 mg via ORAL
  Filled 2022-01-10 (×2): qty 1

## 2022-01-10 MED ORDER — LEVOTHYROXINE SODIUM 50 MCG PO TABS
75.0000 ug | ORAL_TABLET | Freq: Every day | ORAL | Status: DC
  Administered 2022-01-11 – 2022-01-15 (×5): 75 ug via ORAL
  Filled 2022-01-10 (×5): qty 1

## 2022-01-10 MED ORDER — RIVASTIGMINE TARTRATE 1.5 MG PO CAPS
1.5000 mg | ORAL_CAPSULE | Freq: Two times a day (BID) | ORAL | Status: DC
  Administered 2022-01-10 – 2022-01-15 (×10): 1.5 mg via ORAL
  Filled 2022-01-10 (×10): qty 1

## 2022-01-10 MED ORDER — METHOCARBAMOL 500 MG PO TABS: 500.0000 mg | ORAL_TABLET | Freq: Four times a day (QID) | ORAL | Status: DC | PRN

## 2022-01-10 MED ORDER — SODIUM CHLORIDE 0.9 % IV SOLN
2.0000 g | Freq: Once | INTRAVENOUS | Status: AC
  Administered 2022-01-10: 2 g via INTRAVENOUS
  Filled 2022-01-10: qty 20

## 2022-01-10 MED ORDER — COLCHICINE 0.6 MG PO TABS: 0.6000 mg | ORAL_TABLET | Freq: Every day | ORAL | Status: DC | PRN

## 2022-01-10 NOTE — ED Triage Notes (Signed)
Pt BIB [facility] from [facility] for congested cough since last night. Per EMS crackles on auscultation. Pt has no complaints. MD at facility wants workup for pneumonia. Pt currently on ABX for UTI.

## 2022-01-10 NOTE — Assessment & Plan Note (Signed)
Stable.  Continue home meds. ?

## 2022-01-10 NOTE — Subjective & Objective (Signed)
Mr. Nicklaus, an 81 y/o with Parkinson's, s/p CVA with aphasia, DM, Hypothyroidism, chronic HFpEF, recent ORIF right intratrochanteric fx with blood loss anemia was noted by SNF staff to have cough today. With concern for respiratory issues patient transported to [facility] for further evaluation.

## 2022-01-10 NOTE — Assessment & Plan Note (Addendum)
Echocardiogram showed reduced EF of 30-35% with WMA's and moderate MR, moderate TR.  Volume status has improved with diuresis Net IO Since Admission: -9,993 mL [01/15/22 1403]  --Cardiology consulted given new finding of reduced EF, may require ischemic evaluation given WMA's -- after discussion with pt and family, this will be deferred after initial medical management and repeat Echo in coming months  --Treated with IV Lasix 40 mg daily --Diuresis held on 9/21 due to Cr rise --Start oral Lasix 40 mg daily --Coreg 3.125 mg BID --Start Entresto tomorrow (9/23) --Wilder Glade 10 mg ddaily --Daily weights  --Low sodium diet --Monitor renal function, electrolytes closely at follow up.

## 2022-01-10 NOTE — ED Notes (Signed)
Advised nurse that patient has ready bed 

## 2022-01-10 NOTE — Assessment & Plan Note (Addendum)
Hyperglycemia  Reduce dose of 70/30 insulin slightly to 6 units BID. Covered with sliding scale Novolog and was using 10-12 units daily   Also on Farxiga for heart failure. Close PCP follow up to adjust regimen as needed.

## 2022-01-10 NOTE — Assessment & Plan Note (Signed)
Continue levothyroxine 

## 2022-01-10 NOTE — Assessment & Plan Note (Addendum)
From prior admission: Patient with weight loss > 25 lbs. Dr. Sabra Heck, PCP in August wanted CT abd/pelvis to r/o occult malignancy. Patient reports loss of taste, smell and appetite since testing positive for Covid... --On Megace for appetite stimulation --Glucerna shakes TID between meals --Vitamins

## 2022-01-10 NOTE — Assessment & Plan Note (Addendum)
Right lower lobe infiltrate on chest CT Initially treated with IV antibiotics. Finished up course with Augmentin. --Continue as needed cough medication

## 2022-01-10 NOTE — Assessment & Plan Note (Addendum)
Continue blood pressure control with meds for heart failure, as outlined. BP's have been soft at times. Recommend BP check 2-3 times daily until next cardiology follow up.

## 2022-01-10 NOTE — ED Notes (Signed)
Pt went to CT

## 2022-01-10 NOTE — ED Provider Notes (Signed)
[facility] Provider Note    Event Date/Time   First MD Initiated Contact with Patient 01/10/22 0820     (approximate)   History   Chief Complaint: Cough   HPI  Gilbert HATCHEL Sr. is a 81 y.o. male with a history of hypertension diabetes CKD prior stroke with residual aphasia, Parkinson's disease is sent to the ED due to cough and suspected pneumonia by medical staff at his care facility.  Patient is currently on antibiotics for UTI.  Patient denies any complaints, but obviously has limited ability to provide a detailed history..     Physical Exam   Triage Vital Signs: ED Triage Vitals  Enc Vitals Group     BP 01/10/22 0820 130/65     Pulse Rate 01/10/22 0820 75     Resp 01/10/22 0820 20     Temp 01/10/22 0820 98.3 F (36.8 C)     Temp Source 01/10/22 0820 Oral     SpO2 01/10/22 0820 92 %     Weight 01/10/22 0817 167 lb (75.8 kg)     Height 01/10/22 0817 5\' 10"  (1.778 m)     Head Circumference --      Peak Flow --      Pain Score 01/10/22 0817 0     Pain Loc --      Pain Edu? --      Excl. in [place]? --     Most recent vital signs: Vitals:   01/10/22 1030 01/10/22 1045  BP: (!) 127/58   Pulse: 63 63  Resp: (!) 22 15  Temp:    SpO2: 95% 95%    General: Awake, no distress.  CV:  Good peripheral perfusion.  Regular rate and rhythm.  Symmetric distal pulses. Resp:  Normal effort.  Left lower lung crackles.  Diminished, bronchial breath sounds diffusely on the right lung  Abd:  No distention.  Soft nontender Other:  No lower extremity edema.  Dry mucous membranes.   ED Results / Procedures / Treatments   Labs (all labs ordered are listed, but only abnormal results are displayed) Labs Reviewed  COMPREHENSIVE METABOLIC PANEL - Abnormal; Notable for the following components:      Result Value   Sodium 131 (*)    Potassium 5.5 (*)    CO2 20 (*)    Glucose, Bld 153 (*)    Calcium 8.0 (*)    Total Protein 6.2 (*)    Albumin 2.7 (*)     Total Bilirubin 1.4 (*)    All other components within normal limits  CBC WITH DIFFERENTIAL/PLATELET - Abnormal; Notable for the following components:   RBC 3.18 (*)    Hemoglobin 9.7 (*)    HCT 31.0 (*)    RDW 16.6 (*)    All other components within normal limits  BRAIN NATRIURETIC PEPTIDE - Abnormal; Notable for the following components:   B Natriuretic Peptide 1,827.4 (*)    All other components within normal limits  TROPONIN I (HIGH SENSITIVITY) - Abnormal; Notable for the following components:   Troponin I (High Sensitivity) 39 (*)    All other components within normal limits  SARS CORONAVIRUS 2 BY RT PCR  PROCALCITONIN     EKG    RADIOLOGY Chest x-ray interpreted by me which shows diffuse infiltrates, concerning for infection versus edema.  Radiology report reviewed  CT chest shows diffuse edema as well as an area of consolidation with air bronchogram concerning for CHF with superimposed  bacterial pneumonia.  Radiology report reviewed   PROCEDURES:  Procedures   MEDICATIONS ORDERED IN ED: Medications  cefTRIAXone (ROCEPHIN) 2 g in sodium chloride 0.9 % 100 mL IVPB (has no administration in time range)  azithromycin (ZITHROMAX) 500 mg in sodium chloride 0.9 % 250 mL IVPB (has no administration in time range)  furosemide (LASIX) injection 40 mg (has no administration in time range)     IMPRESSION / MDM / ASSESSMENT AND PLAN / ED COURSE  I reviewed the triage vital signs and the nursing notes.                              Differential diagnosis includes, but is not limited to, pneumonia, pleural effusion, pulmonary edema, lung cancer, viral illness, pneumothorax.  Doubt ACS, PE, dissection, pericardial effusion.  Patient's presentation is most consistent with acute presentation with potential threat to life or bodily function.  Patient presents with cough.  He denies any complaints, but not a reliable historian.  Oxygen saturation is 92% on room air at rest.   Will obtain labs and chest x-ray.  Not septic.   ----------------------------------------- 11:04 AM on 01/10/2022 ----------------------------------------- Chest x-ray is abnormal but not clearly diagnostic.  CT scan was obtained which shows evidence of pulmonary edema and bacterial pneumonia.  Procalcitonin is negative, BNP is markedly elevated with a slightly elevated troponin, which is all consistent with primarily CHF physiology.  Vital signs are normal, patient is not septic.  We will give Rocephin and azithromycin and plan to admit.  IV Lasix ordered. He also has multiple healing rib fractures which may be related to his fall in late August given the lack of recent trauma  Clinical Course as of 01/10/22 1110  Sun Jan 10, 2022  1109 Case d/w hospitalist.  [PS]    Clinical Course User Index [PS] Carrie Mew, MD     FINAL CLINICAL IMPRESSION(S) / ED DIAGNOSES   Final diagnoses:  Community acquired pneumonia, unspecified laterality  Congestive heart failure, unspecified HF chronicity, unspecified heart failure type ([place])  Type 2 diabetes mellitus without complication, with long-term current use of insulin ([place])     Rx / DC Orders   ED Discharge Orders     None        Note:  This document was prepared using Dragon voice recognition software and may include unintentional dictation errors.   Carrie Mew, MD 01/10/22 1106

## 2022-01-10 NOTE — Progress Notes (Addendum)
RN attempted to contact [name] to finalize PTA med list.  No answer to multiple telephone attempts.  Family at bedside and are unsure of current medication actions.

## 2022-01-10 NOTE — Assessment & Plan Note (Signed)
My colleague, Dr. Cathlean Sauer discussed with patient wife and daughter Code status. Explained probability of successful resuscitation, explained sequellae of success - ICU care, high mortality, etc. Referred to [email or web address] --Full code for now.

## 2022-01-10 NOTE — Assessment & Plan Note (Signed)
Stable. He does choke easily. Probably with aspiration PNA this admission.  Plan Continue home meds  Asspiration precautions: small bites, double swallow,chin tuck. D/c instructions to include taking meds 1 pill at a time

## 2022-01-10 NOTE — H&P (Signed)
History and Physical    Gilbert SPIELMANN Sr. KDT:[id number] DOB: [birth year] DOA: 01/10/2022  DOS: the patient was seen and examined on 01/10/2022  PCP: Gilbert Aus, MD   Patient coming from: SNF  I have personally briefly reviewed patient's old medical records in [place]  [name], an 81 y/o with Parkinson's, s/p CVA with aphasia, DM, Hypothyroidism, chronic HFpEF, recent ORIF right intratrochanteric fx with blood loss anemia was noted by SNF staff to have cough today. With concern for respiratory issues patient transported to [name] for further evaluation.   ED Course: afebrile, 134/64  HR 64  20. Patient in no acute distress at admission but SOB. Lab: K 5.5, Cr 1.01, glucose 153  Alb 2.7  T. Protein 6.2 BNP 1,827.4  Troponin #1 39  Procalcitonin <0.10. Hgb 9.7 - improving since hip surgery. CXR cardiomegaly, interstial edema, bilateral pleural effusion. CT chest - moderate pleural effusion, mild pulmonary edema, ASD with air-bronchogram RLL. TRH called to admit to manage acute HFpEF, aspiration PNA and co-morbidities.   Review of Systems:  Review of Systems  Constitutional:  Positive for weight loss. Negative for chills and fever.  HENT: Negative.    Eyes: Negative.   Respiratory:  Positive for cough, sputum production and wheezing.   Cardiovascular:  Positive for chest pain. Negative for palpitations.  Gastrointestinal: Negative.   Genitourinary: Negative.   Musculoskeletal:  Positive for joint pain.  Skin: Negative.   Neurological: Negative.   Endo/Heme/Allergies: Negative.   Psychiatric/Behavioral: Negative.      Past Medical History:  Diagnosis Date   Acute kidney injury superimposed on CKD ([name]) 04/25/2021   Choledocholithiasis    Chronic cholecystitis    Diabetes mellitus without complication ([name])    Hypertension    Hypertensive urgency 06/10/2019   Parkinson disease ([name])    Stroke [name])     Past Surgical History:  Procedure Laterality Date    ELBOW SURGERY     ENDOSCOPIC RETROGRADE CHOLANGIOPANCREATOGRAPHY (ERCP) WITH PROPOFOL N/[name] 06/12/2019   Procedure: ENDOSCOPIC RETROGRADE CHOLANGIOPANCREATOGRAPHY (ERCP) WITH PROPOFOL;  Surgeon: Gilbert Lame, MD;  Location: [name];  Service: [name];  Laterality: N/[name];   ERCP N/[name] 07/10/2019   Procedure: ENDOSCOPIC RETROGRADE CHOLANGIOPANCREATOGRAPHY (ERCP) STENT REMOVAL;  Surgeon: Gilbert Lame, MD;  [facility]: [name];  Service: [name];  Laterality: N/[name];   INTRAMEDULLARY (IM) NAIL INTERTROCHANTERIC Right 12/15/2021   Procedure: INTRAMEDULLARY (IM) NAIL INTERTROCHANTERIC;  Surgeon: Gilbert Park, MD;  Location: [name];  Service: Orthopedics;  Laterality: Right;   subtotal cholecystectomy  06/13/2019    Soc Hx - married 74 years. Six children, numerous grands and great-grands. Work- Chief Technology Officer, Sales executive now retired.Lives with wife. Had been mostly I-ADLs   reports that he has never smoked. He has never used smokeless tobacco. He reports that he does not drink alcohol and does not use drugs.  Allergies  Allergen Reactions   Metformin Diarrhea    Family History  Problem Relation Age of Onset   Brain cancer Mother    Prostate cancer Neg Hx    Bladder Cancer Neg Hx    Kidney cancer Neg Hx     Prior to Admission medications   Medication Sig Start Date End Date Taking? Authorizing Provider  acetaminophen (TYLENOL) 500 MG tablet Take 2 tablets (1,000 mg total) by mouth every 8 (eight) hours. 12/21/21   Gilbert Slocumb, DO  alum & mag hydroxide-simeth (MAALOX/MYLANTA) 200-200-20 MG/5ML suspension Take 30 mLs by mouth every 4 (four) hours as needed  for indigestion. 12/21/21   Gilbert Kindred A, DO  amLODipine (NORVASC) 5 MG tablet Take 1 tablet (5 mg total) by mouth daily. 12/22/21   Gilbert Slocumb, DO  aspirin EC 81 MG tablet Take 1 tablet (81 mg total) by mouth 2 (two) times daily. 12/21/21   Gilbert Kindred A, DO  atenolol (TENORMIN) 25 MG tablet Take  25 mg by mouth daily. 11/05/21   [provider]  atorvastatin (LIPITOR) 40 MG tablet Take 40 mg by mouth daily. 11/05/21   [provider]  carbidopa-levodopa (SINEMET IR) 25-100 MG tablet Take 2 tablets by mouth 4 (four) times daily. 12/07/18   [provider]  colchicine 0.6 MG tablet Take 1 tablet by mouth daily as needed. 07/04/20   [provider]  divalproex (DEPAKOTE) 500 MG DR tablet Take 500 mg by mouth 2 (two) times daily. 12/11/21   [provider]  docusate sodium (COLACE) 100 MG capsule Take 1 capsule (100 mg total) by mouth 2 (two) times daily. 12/21/21   Gilbert Slocumb, DO  feeding supplement, GLUCERNA SHAKE, (GLUCERNA SHAKE) LIQD Take 237 mLs by mouth 3 (three) times daily between meals. 12/21/21   Gilbert Kindred A, DO  hydrALAZINE (APRESOLINE) 100 MG tablet Take 1 tablet (100 mg total) by mouth 3 (three) times daily. Patient taking differently: Take 50 mg by mouth 2 (two) times daily. 04/22/21 05/22/21  Richarda Osmond, MD  insulin aspart protamine- aspart (NOVOLOG MIX 70/30) (70-30) 100 UNIT/ML injection Inject 0.1 mLs (10 Units total) into the skin 2 (two) times daily with [name] meal. 12/21/21   Gilbert Slocumb, DO  levothyroxine (SYNTHROID, LEVOTHROID) 75 MCG tablet Take 75 mcg by mouth daily before breakfast.    [provider]  magnesium hydroxide (MILK OF MAGNESIA) 400 MG/5ML suspension Take 30 mLs by mouth daily as needed for mild constipation or moderate constipation. 12/21/21   Gilbert Slocumb, DO  megestrol (MEGACE) 20 MG tablet Take 20 mg by mouth daily. 12/11/21   [provider]  methocarbamol (ROBAXIN) 500 MG tablet Take 1 tablet (500 mg total) by mouth every 6 (six) hours as needed for muscle spasms. 12/21/21   Gilbert Slocumb, DO  Multiple Vitamin (MULTIVITAMIN WITH MINERALS) TABS tablet Take 1 tablet by mouth daily. 12/22/21   Gilbert Kindred A, DO  oxyCODONE (OXY IR/ROXICODONE) 5 MG immediate release tablet  Take 1 tablet (5 mg total) by mouth every 4 (four) hours as needed for severe pain. 12/21/21   Gilbert Slocumb, DO  polyethylene glycol (MIRALAX / GLYCOLAX) 17 g packet Take 17 g by mouth daily. Hold if loose or frequent stools 12/21/21   Gilbert Kindred A, DO  rivastigmine (EXELON) 1.5 MG capsule Take 1.5 mg by mouth 2 (two) times daily. 03/31/21   [provider]  senna (SENOKOT) 8.6 MG TABS tablet Take 1 tablet (8.6 mg total) by mouth 2 (two) times daily. Hold if loose or frequent stools 12/21/21   Gilbert Kindred A, DO  vitamin C (ASCORBIC ACID) 500 MG tablet Take 1,000 mg by mouth daily.    [provider]    Physical Exam: Vitals:   01/10/22 1300 01/10/22 1400 01/10/22 1430 01/10/22 1445  BP: (!) 126/59 (!) 123/57 133/60   Pulse: 67 70 69 72  Resp: 19 20 (!) 22 19  Temp:      TempSrc:      SpO2: 92% 93% 94% 93%  Weight:      Height:  Physical Exam Constitutional:      Comments: Chronically ill appearing. thin  HENT:     Head: Normocephalic and atraumatic.     Mouth/Throat:     Mouth: Mucous membranes are moist.  Eyes:     Extraocular Movements: Extraocular movements intact.     Conjunctiva/sclera: Conjunctivae normal.     Pupils: Pupils are equal, round, and reactive to light.  Cardiovascular:     Rate and Rhythm: Normal rate and regular rhythm.     Pulses: Normal pulses.     Heart sounds: Normal heart sounds.  Pulmonary:     Effort: Pulmonary effort is normal.     Comments: Decreased BS at bases, coarse large airway rhonchi, feint bibasilar rales worse on right. No wheezing.  Abdominal:     General: Bowel sounds are normal.     Palpations: Abdomen is soft.  Musculoskeletal:        General: Normal range of motion.     Cervical back: Neck supple. No rigidity.     Right lower leg: Edema (2+ tomid-calve) present.  Skin:    General: Skin is warm and dry.     Coloration: Skin is not jaundiced.     Findings: No lesion.  Neurological:     Mental  Status: He is alert and oriented to person, place, and time.     Cranial Nerves: No cranial nerve deficit.     Comments: Minimal tremor. Moderate UE rigidity, cog-wheeling. Mild dysarthria but speech intelligible.   Psychiatric:        Behavior: Behavior normal.      Labs on Admission: I have personally reviewed following labs and imaging studies  CBC: Recent Labs  Lab 01/10/22 0821  WBC 9.6  NEUTROABS 7.0  HGB 9.7*  HCT 31.0*  MCV 97.5  PLT 606   Basic Metabolic Panel: Recent Labs  Lab 01/10/22 0821  NA 131*  K 5.5*  CL 100  CO2 20*  GLUCOSE 153*  BUN 17  CREATININE 1.01  CALCIUM 8.0*   GFR: Estimated Creatinine Clearance: 57.2 mL/min (by C-G formula based on SCr of 1.01 mg/dL). Liver Function Tests: Recent Labs  Lab 01/10/22 0821  AST 39  ALT <5  ALKPHOS 109  BILITOT 1.4*  PROT 6.2*  ALBUMIN 2.7*   No results for input(s): "LIPASE", "AMYLASE" in the last 168 hours. No results for input(s): "AMMONIA" in the last 168 hours. Coagulation Profile: No results for input(s): "INR", "PROTIME" in the last 168 hours. Cardiac Enzymes: No results for input(s): "CKTOTAL", "CKMB", "CKMBINDEX", "TROPONINI" in the last 168 hours. BNP (last 3 results) No results for input(s): "PROBNP" in the last 8760 hours. HbA1C: No results for input(s): "HGBA1C" in the last 72 hours. CBG: No results for input(s): "GLUCAP" in the last 168 hours. Lipid Profile: No results for input(s): "CHOL", "HDL", "LDLCALC", "TRIG", "CHOLHDL", "LDLDIRECT" in the last 72 hours. Thyroid Function Tests: No results for input(s): "TSH", "T4TOTAL", "FREET4", "T3FREE", "THYROIDAB" in the last 72 hours. Anemia Panel: No results for input(s): "VITAMINB12", "FOLATE", "FERRITIN", "TIBC", "IRON", "RETICCTPCT" in the last 72 hours. Urine analysis:    Component Value Date/Time   COLORURINE YELLOW ([name]) 04/25/2021 2226   APPEARANCEUR CLEAR ([name]) 04/25/2021 2226   APPEARANCEUR Clear 11/11/2016 1406   LABSPEC  1.025 04/25/2021 2226   PHURINE 6.0 04/25/2021 2226   GLUCOSEU 50 ([name]) 04/25/2021 2226   HGBUR NEGATIVE 04/25/2021 2226   BILIRUBINUR NEGATIVE 04/25/2021 2226   BILIRUBINUR Negative 11/11/2016 1406   KETONESUR 5 ([name])  04/25/2021 2226   PROTEINUR NEGATIVE 04/25/2021 2226   NITRITE NEGATIVE 04/25/2021 2226   LEUKOCYTESUR NEGATIVE 04/25/2021 2226    Radiological Exams on Admission: I have personally reviewed images CT Chest Wo Contrast  Result Date: 01/10/2022 CLINICAL DATA:  Respiratory illness.  Congestion with cough. EXAM: CT CHEST WITHOUT CONTRAST TECHNIQUE: Multidetector CT imaging of the chest was performed following the standard protocol without IV contrast. RADIATION DOSE REDUCTION: This exam was performed according to the departmental dose-optimization program which includes automated exposure control, adjustment of the mA and/or kV according to patient size and/or use of iterative reconstruction technique. COMPARISON:  CT angio chest 04/20/2021 FINDINGS: Cardiovascular: Stable cardiac enlargement. No pericardial effusion. Aortic atherosclerosis and multi vessel coronary artery calcifications. Mediastinum/Nodes: Thyroid gland, trachea and esophagus are unremarkable. Calcified mediastinal and hilar lymph nodes compatible with prior granulomatous disease. No axillary or mediastinal adenopathy identified. Lungs/Pleura: Moderate bilateral pleural effusions are identified which are increased in volume compared with the previous exam. Airspace consolidation with air bronchograms identified within the right lower lobe, image 60/6 and image 104/2. Partial atelectasis of the right middle lobe. Mild interlobular septal thickening is noted with [name] lower lung zone gradient. Calcified granular the right upper lobe. Upper Abdomen: No acute abnormality. Aortic atherosclerosis. Calcified granulomas noted within the spleen. Musculoskeletal: There are multiple healing left rib fractures which are new when compared  with the prior exam. There is also [name] healing right anterior second rib fracture. No signs of acute rib fracture at this time. Sternum appears within normal limits. The vertebral body heights are well maintained. IMPRESSION: 1. Cardiac enlargement, moderate bilateral pleural effusions and mild interstitial edema concerning for CHF. 2. Airspace consolidation with air bronchograms identified within the right lower lobe suspicious for pneumonia. Partial atelectasis of the right middle lobe. 3. Multiple healing bilateral rib fractures. 4. Three vessel coronary artery calcifications. 5. Aortic Atherosclerosis (ICD10-I70.0). Electronically Signed   By: Kerby Moors M.D.   On: 01/10/2022 10:04   DG Chest Portable 1 View  Result Date: 01/10/2022 CLINICAL DATA:  Cough, congestion EXAM: PORTABLE CHEST 1 VIEW COMPARISON:  04/25/2021 FINDINGS: Mild cardiac enlargement with increased vascular congestion and interstitial prominence suggesting developing edema. Pleural effusions noted bilaterally with bibasilar atelectasis/consolidation. Difficult to exclude basilar pneumonia. No pneumothorax. Aorta atherosclerotic. Calcified mediastinal lymph nodes again noted. IMPRESSION: Cardiomegaly with developing interstitial edema pattern. New bilateral pleural effusions and bibasilar atelectasis/consolidation. Electronically Signed   By: Jerilynn Mages.  Shick M.D.   On: 01/10/2022 08:37    EKG: I have personally reviewed EKG: no EKG this admission  Assessment/Plan Active Problems:   Acute on chronic diastolic CHF (congestive heart failure) (HCC)   Weight loss, abnormal   End of life care   Dyslipidemia   Type II diabetes mellitus with renal manifestations (HCC)   HTN (hypertension)   Hypothyroidism   Parkinson disease ([place])   HCAP (healthcare-associated pneumonia)    Assessment and Plan: Acute on chronic diastolic CHF (congestive heart failure) ([place]) Last ECHO 2018 with nl EF and grade I DD. Now with pulmonary edema while on [name]  complex medical regimen.  Plan Med tele admit  Continue home meds - add ARB and Fariga  Lasix IV 40mg  q6 x 4  ECHO  Weight loss, abnormal Patient with weight loss > 25 lbs. Dr. Sabra Heck, PCP in August wanted CT abd/pelvis to r/o occult malignancy. Patient reports loss of taste, smell and appetite since testing positive for Covid about 1 year ago. He is taking ensure  Plan  glucerna tid  CT abd/pelvis w w/o contrast  End of life care Discussed with patient wife and daughter Code status. Explained probability of successful resuscitation, explained sequellae of success - ICU care, high mortality, etc. Referred to [email or web address]  Plan Full code for now.   Dyslipidemia Stable. Continue home meds  HCAP (healthcare-associated pneumonia) Afebrile, nlWBC. Suspect aspiration PNA in patient with Parkinson's disease  Plan Continue Rocephin and Azithromycin  Parkinson disease ([place]) Stable. He does choke easily. Probably with aspiration PNA this admission.  Plan Continue home meds  Asspiration precautions: small bites, double swallow,chin tuck. D/c instructions to include taking meds 1 pill at [name] time  Hypothyroidism Continue home meds. Check TSH  HTN (hypertension) BP stable  Plan D/C hydralazine  Avapro  Farixga  Type II diabetes mellitus with renal manifestations ([place]) Last A1C Dec '22 8.1%.  Plan A1C  Farixga 10 mg daily  Low carb diet  SS coverage AC/HS       DVT prophylaxis: Lovenox Code Status: Full Code Family Communication: wife and daughter present during interview and exam. Understand multiple Dx and Tx plan. Interested in in-patient rehab vs home care  Disposition Plan: TBD  Consults called: none  Admission status: Inpatient, Telemetry bed   Adella Hare, MD [facility] 01/10/2022, 3:05 PM

## 2022-01-11 ENCOUNTER — Inpatient Hospital Stay
Admit: 2022-01-11 | Discharge: 2022-01-11 | Disposition: A | Discharge status: Home / Self Care | Payer: HMO | Attending: Internal Medicine | Admitting: Internal Medicine

## 2022-01-11 DIAGNOSIS — I5033 Acute on chronic diastolic (congestive) heart failure: Secondary | ICD-10-CM | POA: Diagnosis not present

## 2022-01-11 DIAGNOSIS — J690 Pneumonitis due to inhalation of food and vomit: Secondary | ICD-10-CM | POA: Diagnosis not present

## 2022-01-11 DIAGNOSIS — E038 Other specified hypothyroidism: Secondary | ICD-10-CM

## 2022-01-11 DIAGNOSIS — E785 Hyperlipidemia, unspecified: Secondary | ICD-10-CM

## 2022-01-11 DIAGNOSIS — E1121 Type 2 diabetes mellitus with diabetic nephropathy: Secondary | ICD-10-CM

## 2022-01-11 DIAGNOSIS — Z794 Long term (current) use of insulin: Secondary | ICD-10-CM

## 2022-01-11 LAB — BASIC METABOLIC PANEL
Anion gap: 10 (ref 5–15)
BUN: 16 mg/dL (ref 8–23)
CO2: 26 mmol/L (ref 22–32)
Calcium: 7.8 mg/dL — ABNORMAL LOW (ref 8.9–10.3)
Chloride: 98 mmol/L (ref 98–111)
Creatinine, Ser: 1.1 mg/dL (ref 0.61–1.24)
GFR, Estimated: >60 mL/min (ref >60)
Glucose, Bld: 210 mg/dL — ABNORMAL HIGH (ref 70–99)
Potassium: 2.9 mmol/L — ABNORMAL LOW (ref 3.5–5.1)
Sodium: 134 mmol/L — ABNORMAL LOW (ref 135–145)

## 2022-01-11 LAB — HEMOGLOBIN A1C
Hgb A1c MFr Bld: 5.4 % (ref 4.8–5.6)
Mean Plasma Glucose: 108.28 mg/dL

## 2022-01-11 LAB — GLUCOSE, CAPILLARY
Glucose-Capillary: 209 mg/dL — ABNORMAL HIGH (ref 70–99)
Glucose-Capillary: 143 mg/dL — ABNORMAL HIGH (ref 70–99)
Glucose-Capillary: 186 mg/dL — ABNORMAL HIGH (ref 70–99)
Glucose-Capillary: 107 mg/dL — ABNORMAL HIGH (ref 70–99)

## 2022-01-11 MED ORDER — FUROSEMIDE 40 MG PO TABS
40.0000 mg | ORAL_TABLET | Freq: Every day | ORAL | Status: DC
  Administered 2022-01-11 – 2022-01-12 (×2): 40 mg via ORAL
  Filled 2022-01-11 (×2): qty 1

## 2022-01-11 MED ORDER — ADULT MULTIVITAMIN W/MINERALS CH
1.0000 | ORAL_TABLET | Freq: Every day | ORAL | Status: DC
  Administered 2022-01-11 – 2022-01-15 (×5): 1 via ORAL
  Filled 2022-01-11 (×5): qty 1

## 2022-01-11 MED ORDER — AMOXICILLIN-POT CLAVULANATE 875-125 MG PO TABS
1.0000 | ORAL_TABLET | Freq: Two times a day (BID) | ORAL | Status: DC
  Administered 2022-01-11 – 2022-01-15 (×8): 1 via ORAL
  Filled 2022-01-11 (×8): qty 1

## 2022-01-11 MED ORDER — POTASSIUM CHLORIDE CRYS ER 20 MEQ PO TBCR
40.0000 meq | EXTENDED_RELEASE_TABLET | ORAL | Status: AC
  Administered 2022-01-11 (×2): 40 meq via ORAL
  Filled 2022-01-11 (×2): qty 2

## 2022-01-11 MED ORDER — GUAIFENESIN-DM 100-10 MG/5ML PO SYRP
5.0000 mL | ORAL_SOLUTION | ORAL | Status: DC | PRN
  Administered 2022-01-11: 5 mL via ORAL
  Filled 2022-01-11: qty 10

## 2022-01-11 NOTE — Discharge Instructions (Signed)

## 2022-01-11 NOTE — Progress Notes (Signed)
Initial Nutrition Assessment  DOCUMENTATION CODES:   Not applicable  INTERVENTION:   -Continue Glucerna Shake po TID, each supplement provides 220 kcal and 10 grams of protein  -MVI with minerals daily -Magic cup TID with meals, each supplement provides 290 kcal and 9 grams of protein  -Provided "Low Sodium Nutrition Therapy" handouts from [facility] Nutrition Care Manual; attached to AVS/ discharge summary   NUTRITION DIAGNOSIS:   Increased nutrient needs related to chronic illness (CHF) as evidenced by estimated needs.  GOAL:   Patient will meet greater than or equal to 90% of their needs  MONITOR:   PO intake, Supplement acceptance  REASON FOR ASSESSMENT:   Malnutrition Screening Tool    ASSESSMENT:   Pt with Parkinson's, s/p CVA with aphasia, DM, Hypothyroidism, chronic HFpEF, recent ORIF right intratrochanteric fx with blood loss anemia was noted by SNF staff to have cough today.  Pt admitted with CHF.   Reviewed I/O's: -4 L x 24 hours  UOP: 4.1 L x 24 hours   Pt unavailable at time of visit. Attempted to speak with pt via call to hospital room phone, however, unable to reach. RD unable to obtain further nutrition-related history or complete nutrition-focused physical exam at this time.    Pt currently on a heart healthy, carb modified diet. Noted meal completions 50%. Pt has consumed Glucerna supplements in the past.   Reviewed wt hx; pt has experienced a 14.9% wt los over the past 9 months. While this is not significant for time frame, it is concerning given pt's advanced age and multiple co-morbidities. Per H&P, pt with decreased appetite as well as decreased sense of taste and smell since COVID-19 diagnosis approximately 1 year ago.   Medications reviewed and include sinemet, colace, lovenox, megace, potassium chloride, and miralax.   Lab Results  Component Value Date   HGBA1C 5.4 01/10/2022   PTA DM medications are 10 units insulin aspart-protamine aspart BID.    Labs reviewed: Na: 134, K: 2.9, CBGS: 135-209 (inpatient orders for glycemic control are 0-15 units insulin aspart TID with meals).    Diet Order:   Diet Order             Diet heart healthy/carb modified Room service appropriate? Yes; Fluid consistency: Thin  Diet effective now                   EDUCATION NEEDS:   No education needs have been identified at this time  Skin:  Skin Assessment: Skin Integrity Issues: Skin Integrity Issues:: Incisions Incisions: rt thigh  Last BM:  01/11/22  Height:   Ht Readings from Last 1 Encounters:  01/10/22 5\' 10"  (1.778 m)    Weight:   Wt Readings from Last 1 Encounters:  01/11/22 75 kg    Ideal Body Weight:  75.5 kg  BMI:  Body mass index is 23.72 kg/m.  Estimated Nutritional Needs:   Kcal:  2050-2250  Protein:  100-125 grams  Fluid:  > 2 L    Loistine Chance, RD, LDN, [place] Registered Dietitian II Certified Diabetes Care and Education Specialist Please refer to [facility] for RD and/or RD on-call/weekend/after hours pager

## 2022-01-11 NOTE — Evaluation (Signed)
Occupational Therapy Evaluation Patient Details Name: Gilbert BAYS Sr. MRN: [id number] DOB: 02/20/1941 Today's Date: 01/11/2022   History of Present Illness Gilbert [name], an 81 y/o with Parkinson's, s/p CVA with aphasia, DM, Hypothyroidism, chronic HFpEF, recent ORIF right intratrochanteric fx with blood loss anemia was noted by SNF staff to have cough today. With concern for respiratory issues and suspected pneumonia by medical staff at his care facility, patient transported to [facility] for further evaluation. Patient is currently on antibiotics for UTI.   Clinical Impression   Gilbert [name] presents with generalized weakness, limited endurance, and impaired balance. He comes to [facility] from [name], where he has been participating in rehabilitation since a mechanical fall at home on 12/14/21, resulting in R hip intertrochantric fx with ORIF performed here on 8/22. Prior to his fall, surgery, and SNF stay, pt has been living at home with his wife; he reports 5-6 falls in the previous year. During today's evaluation, pt was alert and generally oriented but appears slightly more confused than during his August hospitalization. He denied pain today, did endorse significant fatigue; after several minutes sitting EOB, he needed to lie back or to his side to "rest." He requires Min A to come up into standing, Mod A to maintain standing balance w/ RW, displaying a posterior lean. Pt able to maintain standing balance for ~ 2 minutes during each of 3 trials. Pt very nervous with standing, fearful of falling, and also doubts his abilities. Prior to attempting standing, for example, pt says, "I am not going to be able to do it," even though he then is able to move into standing with minimal assistance. He required Max A to return to supine from sitting, as he is unable to elevate his RLE. Pt states that his only wish is to "go home," but he needs additional rehabilitation at SNF to improve his  mobility, reduce his falls risk, and assist him in returning to PLOF. Wife present in room throughout session and in agreement with this recommendation. Will continue to follow pt with OT services during his hospitalization.    Recommendations for follow up therapy are one component of a multi-disciplinary discharge planning process, led by the attending physician.  Recommendations may be updated based on patient status, additional functional criteria and insurance authorization.   Follow Up Recommendations  Skilled nursing-short term rehab (<3 hours/day)    Assistance Recommended at Discharge Frequent or constant Supervision/Assistance  Patient can return home with the following A lot of help with walking and/or transfers;A lot of help with bathing/dressing/bathroom;Assistance with cooking/housework;Assist for transportation;Help with stairs or ramp for entrance    Functional Status Assessment  Patient has had a recent decline in their functional status and demonstrates the ability to make significant improvements in function in a reasonable and predictable amount of time.  Equipment Recommendations  None recommended by OT    Recommendations for Other Services       Precautions / Restrictions Precautions Precautions: Fall Restrictions Weight Bearing Restrictions: No RLE Weight Bearing: Weight bearing as tolerated      Mobility Bed Mobility Overal bed mobility: Needs Assistance Bed Mobility: Supine to Sit, Sit to Supine     Supine to sit: Min assist Sit to supine: Max assist   General bed mobility comments: Max A for raising RLE to bed level with sit<supine    Transfers Overall transfer level: Needs assistance Equipment used: Rolling walker (2 wheels) Transfers: Sit to/from Stand Sit to Stand: Min assist  Balance Overall balance assessment: Needs assistance, History of Falls Sitting-balance support: Bilateral upper extremity supported, Feet  supported Sitting balance-Leahy Scale: Good Sitting balance - Comments: R or posterior lean after short period sitting EOB, 2/2 fatigue Postural control: Right lateral lean Standing balance support: During functional activity, Bilateral upper extremity supported, Reliant on assistive device for balance Standing balance-Leahy Scale: Fair Standing balance comment: Heavy reliance on the RW for support in static standing.  anxious and fearful of falling                           ADL either performed or assessed with clinical judgement   ADL Overall ADL's : Needs assistance/impaired                 Upper Body Dressing : Set up;Min guard Upper Body Dressing Details (indicate cue type and reason): requires verbal cueing but no physical assistance Lower Body Dressing: Maximal assistance                 General ADL Comments: Anticipate Mod-Max A for all OOB fxl mobility     Vision         Perception     Praxis      Pertinent Vitals/Pain Pain Assessment Pain Assessment: 0-10     Hand Dominance Right   Extremity/Trunk Assessment Upper Extremity Assessment Upper Extremity Assessment: Overall WFL for tasks assessed   Lower Extremity Assessment Lower Extremity Assessment: RLE deficits/detail RLE Deficits / Details: s/p IM fixation of R intertrochantric hip fx 3 wks ago, pt continues to display RLE weakness and mild pain   Cervical / Trunk Assessment Cervical / Trunk Assessment: Normal   Communication Communication Communication: HOH   Cognition Arousal/Alertness: Awake/alert Behavior During Therapy: WFL for tasks assessed/performed Overall Cognitive Status: Within Functional Limits for tasks assessed                                 General Comments: Pt is alert and oriented but appears less sharp cognitively than he was during his hospitalization in late August. He is somewhat anxious today and repeatedly expresses fear of falling.      General Comments       Exercises Other Exercises Other Exercises: sitting EOB x 10 minutes; sit<>stand X 3, with focus on maintaining standing balance during each trial   Shoulder Instructions      Home Living Family/patient expects to be discharged to:: Private residence Living Arrangements: Spouse/significant other Available Help at Discharge: Family;Available 24 hours/day Type of Home: House Home Access: Ramped entrance     Home Layout: Two level;Able to live on main level with bedroom/bathroom     Bathroom Shower/Tub: Teacher, early years/pre: Standard     Home Equipment: Conservation officer, nature (2 wheels);Cane - single point;BSC/3in1;Grab bars - tub/shower   Additional Comments: Since fall at home, hip fx, ORIF in late August, pt has been in rehab at [facility].      Prior Functioning/Environment Prior Level of Function : Needs assist             Mobility Comments: patient reports 5-6 falls this year, uses rolling walker at baseline. Lives with wife, 2 children nearby ADLs Comments: requires asst for bathing, dressing, all IADL        OT Problem List: Decreased strength;Decreased range of motion;Decreased activity tolerance;Impaired balance (sitting and/or standing);Decreased cognition;Decreased knowledge of  use of DME or AE      OT Treatment/Interventions: Self-care/ADL training;Therapeutic exercise;Patient/family education;Balance training;Therapeutic activities;DME and/or AE instruction    OT Goals(Current goals can be found in the care plan section) Acute Rehab OT Goals Patient Stated Goal: to go home OT Goal Formulation: With patient Time For Goal Achievement: 01/25/22 Potential to Achieve Goals: Good ADL Goals Pt Will Perform Lower Body Dressing: with min assist Pt Will Transfer to Toilet: with min assist;regular height toilet (using LRAD) Pt Will Perform Tub/Shower Transfer: with min assist;shower seat;ambulating;rolling walker  OT  Frequency: Min 2X/week    Co-evaluation              AM-PAC OT "6 Clicks" Daily Activity     Outcome Measure Help from another person eating meals?: None Help from another person taking care of personal grooming?: A Little Help from another person toileting, which includes using toliet, bedpan, or urinal?: A Lot Help from another person bathing (including washing, rinsing, drying)?: A Lot Help from another person to put on and taking off regular upper body clothing?: A Little Help from another person to put on and taking off regular lower body clothing?: A Lot 6 Click Score: 16   End of Session Equipment Utilized During Treatment: Rolling walker (2 wheels)  Activity Tolerance: Patient tolerated treatment well;Patient limited by fatigue Patient left: in bed;with call bell/phone within reach;with bed alarm set;with family/visitor present  OT Visit Diagnosis: Unsteadiness on feet (R26.81);Muscle weakness (generalized) (M62.81);History of falling (Z91.81)                Time: 7062-3762 OT Time Calculation (min): 31 min Charges:  OT General Charges $OT Visit: 1 Visit OT Evaluation $OT Eval Low Complexity: 1 Low OT Treatments $Self Care/Home Management : 23-37 mins Josiah Lobo, PhD, MS, OTR/L 01/11/22, 11:36 AM

## 2022-01-11 NOTE — Hospital Course (Addendum)
Gilbert [name] was admitted to the hospital with the working diagnosis of decompensated heart failure.   81 yo male with the past medical history of Parkinson's disease, T2DM, hypothyroidism, recent right hip intertrochanteric fracture sp ORIF and heart failure who presented with dyspnea. Patient was transferred from SNF due to respiratory distress and cough. Apparently patient had a chocking event at the rehab, triggering his respiratory symptoms.  On his initial physical examination blood pressure 134/64, HR 64, RR 20. 02 saturation 94%, lungs with decreased breath sounds, positive rhonchi and rales, no wheezing, heart with S1 and S2 present and rhythmic, abdomen with no distention, positive lower extremity edema.   Na 131, K 5,5 CL 100 bicarbonate 10 glucose 153, bun 187 cr 1,0 BNP 1,827  High sensitive troponin 39  Wbc 9,6 hgb 9,7 plt 268  Sars covid 19 negative   Chest radiograph with mild cardiomegaly, positive bilateral interstitial infiltrates with cephalization of the vasculature.  CT chest with bilateral ground glass opacities, bilateral pleural effusions and bilateral atelectasis. More dense at the right lower lobe with air bronchogram. Multiple healing bilateral rib fractures.  CT abdomen moderate pleural effusions. With dependent atelectasis. Bladder with boiled appearance.   Patient was placed on diuretic therapy and antibiotics.

## 2022-01-11 NOTE — Evaluation (Signed)
Physical Therapy Evaluation Patient Details Name: Gilbert DEROCHE Sr. MRN: [id number] DOB: March 01, 1941 Today's Date: 01/11/2022  History of Present Illness  Gilbert [name], an 81 y/o with Parkinson's, s/p CVA with aphasia, DM, Hypothyroidism, chronic HFpEF, recent ORIF right intratrochanteric fx with blood loss anemia was noted by SNF staff to have cough today. With concern for respiratory issues and suspected pneumonia by medical staff at his care facility, patient transported to [facility] for further evaluation. Patient is currently on antibiotics for UTI.  Clinical Impression  Pt is a pleasant 81 year old male who was admitted for dyslipidemia with complaints of cough. Pt performs bed mobility with min assist to get to EOB and then max assist for return back to bed. Pt performed transfers with mod assist and unable to perform ambulation this date. Pt demonstrates deficits with strength/endurance/mobility. Would benefit from skilled PT to address above deficits and promote optimal return to PLOF; recommend transition to STR upon discharge from acute hospitalization.      Recommendations for follow up therapy are one component of a multi-disciplinary discharge planning process, led by the attending physician.  Recommendations may be updated based on patient status, additional functional criteria and insurance authorization.  Follow Up Recommendations Skilled nursing-short term rehab (<3 hours/day) Can patient physically be transported by private vehicle: No    Assistance Recommended at Discharge Frequent or constant Supervision/Assistance  Patient can return home with the following  Two people to help with walking and/or transfers;A lot of help with bathing/dressing/bathroom;Assistance with cooking/housework;Direct supervision/assist for medications management;Direct supervision/assist for financial management;Assist for transportation;Help with stairs or ramp for entrance    Equipment  Recommendations None recommended by PT  Recommendations for Other Services       Functional Status Assessment Patient has had a recent decline in their functional status and demonstrates the ability to make significant improvements in function in a reasonable and predictable amount of time.     Precautions / Restrictions Precautions Precautions: Fall Restrictions Weight Bearing Restrictions: No RLE Weight Bearing: Weight bearing as tolerated      Mobility  Bed Mobility Overal bed mobility: Needs Assistance Bed Mobility: Supine to Sit, Sit to Supine     Supine to sit: Min assist Sit to supine: Max assist   General bed mobility comments: needs cues for sequencing, including hand placement. Once seated at EOB, tends to lean in post direction with occasional cues for upright posture.    Transfers Overall transfer level: Needs assistance Equipment used: Rolling walker (2 wheels) Transfers: Sit to/from Stand Sit to Stand: Mod assist           General transfer comment: bed elevated and takes 2 attempts to stand with RW. Heavy post lean with pt unable to self correct. Pt very fearful of falling. Unable to progress to ambulation at this time. Demonstrates buckling on B knees with each standing attempt    Ambulation/Gait               General Gait Details: unable due to quick fatigue and poor balance  Stairs            Wheelchair Mobility    Modified Rankin (Stroke Patients Only)       Balance Overall balance assessment: Needs assistance, History of Falls Sitting-balance support: Bilateral upper extremity supported, Feet supported Sitting balance-Leahy Scale: Fair     Standing balance support: Bilateral upper extremity supported Standing balance-Leahy Scale: Poor Standing balance comment: heavy post leaning noted with max assist for upright  posture. B flexed knees                             Pertinent Vitals/Pain Pain Assessment Pain  Assessment: Faces Faces Pain Scale: Hurts a little bit Pain Location: R LE Pain Descriptors / Indicators: Grimacing, Operative site guarding, Sore Pain Intervention(s): Limited activity within patient's tolerance, Repositioned    Home Living Family/patient expects to be discharged to:: Skilled nursing facility                   Additional Comments: has been at SNF at [facility] since ORIF in Late Aug. Plans to return at discharge    Prior Function Prior Level of Function : Needs assist             Mobility Comments: patient reports 5-6 falls this year, uses rolling walker at baseline. Lives with wife, 2 children nearby ADLs Comments: requires asst for bathing, dressing, all IADL     Hand Dominance        Extremity/Trunk Assessment   Upper Extremity Assessment Upper Extremity Assessment: Overall WFL for tasks assessed    Lower Extremity Assessment Lower Extremity Assessment: Generalized weakness (R LE grossly 3/5)       Communication   Communication: HOH  Cognition Arousal/Alertness: Awake/alert Behavior During Therapy: WFL for tasks assessed/performed Overall Cognitive Status: Within Functional Limits for tasks assessed                                 General Comments: Pt alert and oriented, however delayed response and gives false information (joking?) that he later resends with encouragement from family at bedside        General Comments      Exercises     Assessment/Plan    PT Assessment Patient needs continued PT services  PT Problem List Decreased strength;Decreased mobility;Decreased safety awareness;Decreased activity tolerance;Decreased balance;Decreased range of motion;Decreased knowledge of use of DME;Pain       PT Treatment Interventions DME instruction;Therapeutic exercise;Gait training;Balance training;Functional mobility training;Therapeutic activities;Patient/family education;Neuromuscular re-education    PT  Goals (Current goals can be found in the Care Plan section)  Acute Rehab PT Goals Patient Stated Goal: to go to [facility] PT Goal Formulation: With patient Time For Goal Achievement: 01/25/22 Potential to Achieve Goals: Fair    Frequency Min 2X/week     Co-evaluation               AM-PAC PT "6 Clicks" Mobility  Outcome Measure Help needed turning from your back to your side while in a flat bed without using bedrails?: A Little Help needed moving from lying on your back to sitting on the side of a flat bed without using bedrails?: A Lot Help needed moving to and from a bed to a chair (including a wheelchair)?: A Lot Help needed standing up from a chair using your arms (e.g., wheelchair or bedside chair)?: A Lot Help needed to walk in hospital room?: Total Help needed climbing 3-5 steps with a railing? : Total 6 Click Score: 11    End of Session Equipment Utilized During Treatment: Gait belt Activity Tolerance: Patient limited by pain Patient left: in bed;with bed alarm set Nurse Communication: Mobility status PT Visit Diagnosis: Unsteadiness on feet (R26.81);Other abnormalities of gait and mobility (R26.89);Muscle weakness (generalized) (M62.81);Difficulty in walking, not elsewhere classified (R26.2);History of falling (Z91.81);Pain Pain - Right/Left:  Right Pain - part of body: Leg    Time: 0165-8006 PT Time Calculation (min) (ACUTE ONLY): 20 min   Charges:   PT Evaluation $PT Eval Moderate Complexity: 1 [address], PT, DPT, GCS [phone]   Annaya Bangert 01/11/2022, 4:22 PM

## 2022-01-11 NOTE — Assessment & Plan Note (Signed)
Renal function near baseline

## 2022-01-11 NOTE — Progress Notes (Signed)
Progress Note   Patient: Gilbert [name] WER:[id number] DOB: 07/19/1940 DOA: 01/10/2022     1 DOS: the patient was seen and examined on 01/11/2022   Brief hospital course: Gilbert [name] was admitted to the hospital with the working diagnosis of decompensated heart failure.   81 yo male with the past medical history of Parkinson's disease, T2DM, hypothyroidism, recent right hip intertrochanteric fracture sp ORIF and heart failure who presented with dyspnea. Patient was transferred from SNF due to respiratory distress and cough. Apparently patient had a chocking event at the rehab, triggering his respiratory symptoms.  On his initial physical examination blood pressure 134/64, HR 64, RR 20. 02 saturation 94%, lungs with decreased breath sounds, positive rhonchi and rales, no wheezing, heart with S1 and S2 present and rhythmic, abdomen with no distention, positive lower extremity edema.   Na 131, K 5,5 CL 100 bicarbonate 10 glucose 153, bun 187 cr 1,0 BNP 1,827  High sensitive troponin 39  Wbc 9,6 hgb 9,7 plt 268  Sars covid 19 negative   Chest radiograph with mild cardiomegaly, positive bilateral interstitial infiltrates with cephalization of the vasculature.  CT chest with bilateral ground glass opacities, bilateral pleural effusions and bilateral atelectasis. More dense at the right lower lobe with air bronchogram. Multiple healing bilateral rib fractures.  CT abdomen moderate pleural effusions. With dependent atelectasis. Bladder with boiled appearance.   Patient was placed on diuretic therapy and antibiotics.        Assessment and Plan: Acute on chronic diastolic CHF (congestive heart failure) (HCC) Echocardiogram is pending result.  His volume status has improved with diuresis  Urine output is 4,050 ml since admission.  Systolic blood pressure 195 to 121 mnHg.  Plan to continue blood pressure control Transition to oral furosemide 40 mg po daily.   Aspiration pneumonia  ([place]) Right lower lobe infiltrate on chest CT  Plan to continue antibiotic therapy with Augmentin for 5 days. Ok to discontinue IV antibiotic therapy Continue oxymetry monitoring and consult speech and swallow evaluation.   CKD (chronic kidney disease), stage IIIa Hyponatremia, hyper and hypo kalemia  Patient with stable renal function with serum cr at 1,10 with K at 2,9 and serum bicarbonate at 26. Na 134.   Plan to continue K correction with Kcl follow up renal function and electrolytes in am Transition to oral furosemide in am.   HTN (hypertension) Continue blood pressure control with amlodipine, atenolol, and irbarsartan.   Dyslipidemia Continue with atorvastatin.   End of life care Discussed with patient wife and daughter Code status. Explained probability of successful resuscitation, explained sequellae of success - ICU care, high mortality, etc. Referred to [email or web address]  Plan Full code for now.   Hypothyroidism Continue home meds. Check TSH  Weight loss, abnormal Patient with weight loss > 25 lbs. Dr. Sabra Heck, PCP in August wanted CT abd/pelvis to r/o occult malignancy. Patient reports loss of taste, smell and appetite since testing positive for Covid about 1 year ago. He is taking ensure  Plan glucerna tid  CT abd/pelvis w w/o contrast  Parkinson disease ([place]) Continue with rivastigmine, divalproex Continue with sinemet.   Type II diabetes mellitus with renal manifestations (HCC) Hyperglycemia   Continue glucose cover and monitoring with insulin sliding scale,  Patient is tolerating po well. Fasting glucose is 210 this am.   Continue with SGLT 2 inh.         Subjective: Patient with no chest pain, improved dyspnea, continue to have intermittent cough  Physical Exam: Vitals:   01/11/22 0011 01/11/22 0323 01/11/22 0500 01/11/22 0801  BP: (!) 136/58 (!) 121/51  115/60  Pulse: 68 60  (!) 56  Resp: 19 19  17   Temp: 98.1 F (36.7 C) 98.4  F (36.9 C)  98 F (36.7 C)  TempSrc: Oral Oral    SpO2: 92% 96%    Weight:   75 kg   Height:       Neurology awake and alert ENT with mild pallor Cardiovascular with S1 and S2 present and rhythmic Respiratory with mild rales at bases with no wheezing Abdomen with no distention  Trace right lower extremity edema  Data Reviewed:    Family Communication: I spoke with patient's wife and grand daughter at the bedside, we talked in detail about patient's condition, plan of care and prognosis and all questions were addressed.   Disposition: Status is: Inpatient Remains inpatient appropriate because: heart failure and aspiration pneumonia   Planned Discharge Destination: Home      Author: Tawni Millers, MD 01/11/2022 3:00 PM  For on call review [email or web address].

## 2022-01-12 DIAGNOSIS — I5033 Acute on chronic diastolic (congestive) heart failure: Secondary | ICD-10-CM | POA: Diagnosis not present

## 2022-01-12 DIAGNOSIS — I2510 Atherosclerotic heart disease of native coronary artery without angina pectoris: Secondary | ICD-10-CM

## 2022-01-12 DIAGNOSIS — I5021 Acute systolic (congestive) heart failure: Secondary | ICD-10-CM

## 2022-01-12 LAB — ECHOCARDIOGRAM COMPLETE
AR max vel: 2.44 cm2
AV Area VTI: 2.2 cm2
AV Area mean vel: 2.27 cm2
AV Mean grad: 2 mmHg
AV Peak grad: 4.2 mmHg
Ao pk vel: 1.03 m/s
Area-P 1/2: 3.42 cm2
Height: 70 in
S' Lateral: 4.3 cm
Weight: 2645.52 oz

## 2022-01-12 LAB — BASIC METABOLIC PANEL
Anion gap: 11 (ref 5–15)
BUN: 14 mg/dL (ref 8–23)
CO2: 28 mmol/L (ref 22–32)
Calcium: 7.9 mg/dL — ABNORMAL LOW (ref 8.9–10.3)
Chloride: 100 mmol/L (ref 98–111)
Creatinine, Ser: 1.24 mg/dL (ref 0.61–1.24)
GFR, Estimated: 58 mL/min — ABNORMAL LOW (ref >60)
Glucose, Bld: 157 mg/dL — ABNORMAL HIGH (ref 70–99)
Potassium: 3 mmol/L — ABNORMAL LOW (ref 3.5–5.1)
Sodium: 139 mmol/L (ref 135–145)

## 2022-01-12 LAB — GLUCOSE, CAPILLARY
Glucose-Capillary: 157 mg/dL — ABNORMAL HIGH (ref 70–99)
Glucose-Capillary: 168 mg/dL — ABNORMAL HIGH (ref 70–99)
Glucose-Capillary: 160 mg/dL — ABNORMAL HIGH (ref 70–99)

## 2022-01-12 MED ORDER — FUROSEMIDE 10 MG/ML IJ SOLN
40.0000 mg | Freq: Every day | INTRAMUSCULAR | Status: DC
  Administered 2022-01-12 – 2022-01-14 (×3): 40 mg via INTRAVENOUS
  Filled 2022-01-12 (×3): qty 4

## 2022-01-12 MED ORDER — POTASSIUM CHLORIDE CRYS ER 20 MEQ PO TBCR
40.0000 meq | EXTENDED_RELEASE_TABLET | ORAL | Status: AC
  Administered 2022-01-12 (×2): 40 meq via ORAL
  Filled 2022-01-12 (×2): qty 2

## 2022-01-12 MED ORDER — CARVEDILOL 6.25 MG PO TABS
6.2500 mg | ORAL_TABLET | Freq: Two times a day (BID) | ORAL | Status: DC
  Administered 2022-01-13 – 2022-01-15 (×5): 6.25 mg via ORAL
  Filled 2022-01-12 (×5): qty 1

## 2022-01-12 NOTE — Plan of Care (Signed)

## 2022-01-12 NOTE — Progress Notes (Signed)
Occupational Therapy Treatment Patient Details Name: Gilbert ANGLEMYER Sr. MRN: [id number] DOB: [birth year] Today's Date: 01/12/2022   History of present illness Gilbert [name], an 81 y/o with Parkinson's, s/p CVA with aphasia, DM, Hypothyroidism, chronic HFpEF, recent ORIF right intratrochanteric fx with blood loss anemia was noted by SNF staff to have cough today. With concern for respiratory issues and suspected pneumonia by medical staff at his care facility, patient transported to [facility] for further evaluation. Patient is currently on antibiotics for UTI.   OT comments  Gilbert [name] displayed considerably improved functional mobility today compared to yesterday's OT session. Today he was able to perform bed mobility w/o physical assistance, including elevating his LE up to bed level from sitting (yesterday needed Max A for this) and could also come into standing from EOB sitting w/o physical assistance (yesterday required Mod A). He was better able to maintain steady standing balance and hold it for a longer period of time and with straight posture, and was able to transfer standing<>BSC with Min A. Pt did have diarrhea while standing at bedside and required UB and LB bathing, clothing change, and linen change. Pt largely able to handle UB bathing/changing himself while requiring Max A for LB clean-up and clothing change. Pt states he is having no RLE or other pain today. Pt pleased with his progress today and displayed a more positive and hopeful attitude. Continue to recommend return to SNF for further rehabilitation following last month's hip fx.    Recommendations for follow up therapy are one component of a multi-disciplinary discharge planning process, led by the attending physician.  Recommendations may be updated based on patient status, additional functional criteria and insurance authorization.    Follow Up Recommendations  Skilled nursing-short term rehab (<3 hours/day)    Assistance  Recommended at Discharge Frequent or constant Supervision/Assistance  Patient can return home with the following  A lot of help with walking and/or transfers;A lot of help with bathing/dressing/bathroom;Assistance with cooking/housework;Assist for transportation;Help with stairs or ramp for entrance   Equipment Recommendations  None recommended by OT    Recommendations for Other Services      Precautions / Restrictions Precautions Precautions: Fall Restrictions Weight Bearing Restrictions: No RLE Weight Bearing: Weight bearing as tolerated       Mobility Bed Mobility Overal bed mobility: Needs Assistance Bed Mobility: Supine to Sit, Sit to Supine     Supine to sit: Supervision Sit to supine: Supervision   General bed mobility comments: Much better transitions supine<>sit than yesterday. Today able to [facility] elevate LE to bed level during sit<supine; yesterday required Max A for this.    Transfers Overall transfer level: Needs assistance   Transfers: Sit to/from Stand, Bed to chair/wheelchair/BSC Sit to Stand: Min guard, Min assist     Step pivot transfers: Min assist     General transfer comment: Much better concentric and eccentric control today. Able to come into standing several times w/o any physical assist from therapist     Balance Overall balance assessment: Needs assistance, History of Falls Sitting-balance support: Bilateral upper extremity supported, Feet supported Sitting balance-Leahy Scale: Good Sitting balance - Comments: Much improved sitting balance today, no leaning   Standing balance support: Bilateral upper extremity supported, During functional activity, Reliant on assistive device for balance Standing balance-Leahy Scale: Fair Standing balance comment: Much better standing balance today; no posterior lean, able to remain in standing for 2-3+ minutes at a time x 4 trials  ADL either performed or assessed with  clinical judgement   ADL Overall ADL's : Needs assistance/impaired             Lower Body Bathing: Maximal assistance;Sitting/lateral leans   Upper Body Dressing : Set up;Min guard Upper Body Dressing Details (indicate cue type and reason): requires verbal cueing but no physical assistance Lower Body Dressing: Maximal assistance Lower Body Dressing Details (indicate cue type and reason): Donning/doffing socks Toilet Transfer: BSC/3in1;Rolling walker (2 wheels);Cueing for safety;Cueing for sequencing;Minimal assistance;Stand-pivot   Toileting- Clothing Manipulation and Hygiene: Maximal assistance;Sit to/from stand              Extremity/Trunk Assessment Upper Extremity Assessment Upper Extremity Assessment: Overall WFL for tasks assessed   Lower Extremity Assessment Lower Extremity Assessment: Generalized weakness   Cervical / Trunk Assessment Cervical / Trunk Assessment: Normal    Vision       Perception     Praxis      Cognition Arousal/Alertness: Awake/alert Behavior During Therapy: WFL for tasks assessed/performed Overall Cognitive Status: Within Functional Limits for tasks assessed                                          Exercises Total Joint Exercises Marching in Standing: AROM, 10 reps, Both, Standing Other Exercises Other Exercises: Educ re: importance of OOB movement, increasing standing balance, safe transfer technique and proper hand placement on RW. LE therex (marching in place)    Shoulder Instructions       General Comments      Pertinent Vitals/ Pain       Pain Assessment Pain Assessment: No/denies pain  Home Living                                          Prior Functioning/Environment              Frequency  Min 2X/week        Progress Toward Goals  OT Goals(current goals can now be found in the care plan section)        Plan Discharge plan remains appropriate;Frequency remains  appropriate    Co-evaluation                 AM-PAC OT "6 Clicks" Daily Activity     Outcome Measure   Help from another person eating meals?: None Help from another person taking care of personal grooming?: A Little Help from another person toileting, which includes using toliet, bedpan, or urinal?: A Lot Help from another person bathing (including washing, rinsing, drying)?: A Lot Help from another person to put on and taking off regular upper body clothing?: A Little Help from another person to put on and taking off regular lower body clothing?: A Lot 6 Click Score: 16    End of Session Equipment Utilized During Treatment: Rolling walker (2 wheels)  OT Visit Diagnosis: Unsteadiness on feet (R26.81);Muscle weakness (generalized) (M62.81);History of falling (Z91.81)   Activity Tolerance Patient tolerated treatment well   Patient Left in bed;with call bell/phone within reach;with bed alarm set;with family/visitor present   Nurse Communication Mobility status        Time: 6440-3474 OT Time Calculation (min): 45 min  Charges: OT General Charges $OT Visit: 1 Visit OT Treatments $Self Care/Home Management : 38-52  mins Josiah Lobo, PhD, MS, OTR/L 01/12/22, 11:13 AM

## 2022-01-12 NOTE — Progress Notes (Addendum)
Progress Note   Patient: Gilbert [name] YBO:[id number] DOB: 12/22/1940 DOA: 01/10/2022     2 DOS: the patient was seen and examined on 01/12/2022   Brief hospital course: [name] was admitted to the hospital with the working diagnosis of decompensated heart failure.   81 yo male with the past medical history of Parkinson's disease, T2DM, hypothyroidism, recent right hip intertrochanteric fracture sp ORIF and heart failure who presented with dyspnea. Patient was transferred from SNF due to respiratory distress and cough. Apparently patient had a chocking event at the rehab, triggering his respiratory symptoms.  On his initial physical examination blood pressure 134/64, HR 64, RR 20. 02 saturation 94%, lungs with decreased breath sounds, positive rhonchi and rales, no wheezing, heart with S1 and S2 present and rhythmic, abdomen with no distention, positive lower extremity edema.   Na 131, K 5,5 CL 100 bicarbonate 10 glucose 153, bun 187 cr 1,0 BNP 1,827  High sensitive troponin 39  Wbc 9,6 hgb 9,7 plt 268  Sars covid 19 negative   Chest radiograph with mild cardiomegaly, positive bilateral interstitial infiltrates with cephalization of the vasculature.  CT chest with bilateral ground glass opacities, bilateral pleural effusions and bilateral atelectasis. More dense at the right lower lobe with air bronchogram. Multiple healing bilateral rib fractures.  CT abdomen moderate pleural effusions. With dependent atelectasis. Bladder with boiled appearance.   Patient was placed on diuretic therapy and antibiotics.        Assessment and Plan: Acute on chronic systolic CHF (congestive heart failure) (HCC) Echocardiogram showed reduced EF of 30-35% with WMA's and moderate MR, moderate TR.  Volume status has improved with diuresis Net IO Since Admission: -4,520 mL [01/12/22 1834] --Transition to PO Lasix 40 mg daily --Cardiology consulted given new finding of reduced EF, may require  ischemic evaluation given WMA's --Daily weights & strict I/O's --Low sodium diet --Monitor renal function, electrolytes  Aspiration pneumonia (HCC) Right lower lobe infiltrate on chest CT Initially on IV antibiotics --Continue Augmentin x 5 days --Continue oxymetry monitoring  --SLP for swallow evaluation  CKD (chronic kidney disease), stage IIIa Renal function near baseline  HTN (hypertension) Continue blood pressure control with amlodipine, atenolol, and irbarsartan.   Dyslipidemia Continue with atorvastatin.   Goals of care, counseling/discussion My colleague, Dr. Cathlean Sauer discussed with patient wife and daughter Code status. Explained probability of successful resuscitation, explained sequellae of success - ICU care, high mortality, etc. Referred to [email or web address] --Full code for now.   Hypothyroidism Continue levothyroxine  Weight loss, abnormal Patient with weight loss > 25 lbs. Dr. Sabra Heck, PCP in August wanted CT abd/pelvis to r/o occult malignancy. Patient reports loss of taste, smell and appetite since testing positive for Covid about 1 year ago. He is taking ensure  Plan glucerna tid  CT abd/pelvis w w/o contrast  Parkinson disease ([place]) Continue with rivastigmine, divalproex Continue with sinemet.   Type II diabetes mellitus with renal manifestations (HCC) Hyperglycemia  Continue insulin sliding scale.   Continue with SGLT 2 inh.         Subjective: Patient reports feeling well and asks when he can get out of the hospital.  Denies CP, SOB, N/V or other complaints.  Says he was doing well at rehab, ambulating up to 50 feet.  Physical Exam: Vitals:   01/11/22 1930 01/11/22 1956 01/12/22 0546 01/12/22 0812  BP: (!) 119/52 (!) 109/52 (!) 125/52 (!) 113/53  Pulse: 64 61 64 67  Resp: 16 17 18    Temp:  97.6 F (36.4 C) 97.8 F (36.6 C) (!) 97.5 F (36.4 C) 98.6 F (37 C)  TempSrc: Oral  Oral Oral  SpO2: 92% 96% 93% 95%  Weight:      Height:        General exam: awake, alert, no acute distress HEENT: atraumatic, clear conjunctiva, anicteric sclera, moist mucus membranes, hearing grossly normal  Respiratory system: CTAB, no wheezes, rales or rhonchi, normal respiratory effort. Cardiovascular system: normal S1/S2, RRR, no pedal edema.   Central nervous system: A&O x3. no gross focal neurologic deficits, normal speech Extremities: moves all, no edema, normal tone Skin: dry, intact, normal temperature Psychiatry: normal mood, flat affect, judgement and insight appear normal    Data Reviewed: Notable labs --- K 3.0, glucose 157, Ca 7.9,   Echo with LVEF 30-35% with regional WMA's, normal diastolic parameters, moderate MR, moderate TR   Family Communication: family members at bedside on rounds   Disposition: Status is: Inpatient Remains inpatient appropriate because: heart failure and aspiration pneumonia    Planned Discharge Destination: Home        Author: [name], DO 01/12/2022 6:39 PM  For on call review [email or web address].

## 2022-01-12 NOTE — Plan of Care (Signed)
  Problem: Education: Goal: Ability to demonstrate management of disease process will improve Outcome: Progressing   Problem: Activity: Goal: Capacity to carry out activities will improve Outcome: Not Progressing

## 2022-01-12 NOTE — Consult Note (Signed)
Cardiology Consultation   Patient ID: Gilbert [name]. MRN: [id number]; DOB: [birth year]  Admit date: 01/10/2022 Date of Consult: 01/12/2022  PCP:  Gilbert Aus, MD   [place] Prov[facility] Cardiologist:  New - Gilbert [name]     Patient Profile:   Gilbert Finner [name]. is a 81 y.o. male with a hx of chronic HFpEF, stroke complicated by aphasia, hypertension, hyperlipidemia, type 2 diabetes mellitus, Parkinson's disease, hypothyroidism, recent right intertrochanteric femur fracture status post ORIF complicated by acute blood loss anemia, who is being seen 01/12/2022 for the evaluation of acute HFrEF at the request of Dr. Arbutus Ped.  History of Present Illness:   Gilbert [name] was hospitalized in late August after suffering a mechanical fall with intertrochanteric of the right femoral neck.  He underwent operative fixation with intramedullary nail on 11/2020.  His hospital course was complicated by acute blood loss anemia requiring 1 unit of PRBCs.  He was discharged to skilled nursing facility for rehabilitation.  He was transferred from his nursing home to the [facility] emergency department 2 days ago due to concerns for pneumonia due to a cough.  Chest radiograph showed cardiomegaly with interstitial edema and bilateral pleural effusions.  CT chest was notable for moderate pleural effusions, mild pulmonary edema, and right lower lobe airspace disease.  Echocardiogram yesterday demonstrated LVEF of 30-35% with akinesis of the mid/apical septal, anteroseptal, inferoseptal, and apical segments.  At this time, Gilbert [name] is without complaints.  He denies having had any chest pain, shortness of breath, palpitations, lightheadedness, or edema.  His cough seems to have improved since he came to the hospital.  He denies fevers or chills.   Past Medical History:  Diagnosis Date   Acute kidney injury superimposed on CKD ([place]) 04/25/2021   Choledocholithiasis    Chronic cholecystitis     Diabetes mellitus without complication ([place])    Hypertension    Hypertensive urgency 06/10/2019   Parkinson disease ([place])    Stroke [facility])     Past Surgical History:  Procedure Laterality Date   ELBOW SURGERY     ENDOSCOPIC RETROGRADE CHOLANGIOPANCREATOGRAPHY (ERCP) WITH PROPOFOL N/A 06/12/2019   Procedure: ENDOSCOPIC RETROGRADE CHOLANGIOPANCREATOGRAPHY (ERCP) WITH PROPOFOL;  Surgeon: Lucilla Lame, MD;  Location: [facility] ENDOSCOPY;  Service: Endoscopy;  Laterality: N/A;   ERCP N/A 07/10/2019   Procedure: ENDOSCOPIC RETROGRADE CHOLANGIOPANCREATOGRAPHY (ERCP) STENT REMOVAL;  Surgeon: Lucilla Lame, MD;  Location: [facility] ENDOSCOPY;  Service: Endoscopy;  Laterality: N/A;   INTRAMEDULLARY (IM) NAIL INTERTROCHANTERIC Right 12/15/2021   Procedure: INTRAMEDULLARY (IM) NAIL INTERTROCHANTERIC;  Surgeon: Thornton Park, MD;  Location: [facility] ORS;  Service: Orthopedics;  Laterality: Right;   subtotal cholecystectomy  06/13/2019     Home Medications:  Prior to Admission medications   Medication Sig Start Date Francelia Mclaren Date Taking? Authorizing Provider  acetaminophen (TYLENOL) 500 MG tablet Take 2 tablets (1,000 mg total) by mouth every 8 (eight) hours. 12/21/21  Yes Nicole Kindred A, DO  amLODipine (NORVASC) 5 MG tablet Take 1 tablet (5 mg total) by mouth daily. 12/22/21  Yes [name], DO  aspirin EC 81 MG tablet Take 1 tablet (81 mg total) by mouth 2 (two) times daily. 12/21/21  Yes Nicole Kindred A, DO  atenolol (TENORMIN) 25 MG tablet Take 25 mg by mouth daily. 11/05/21  Yes [provider]  atorvastatin (LIPITOR) 40 MG tablet Take 40 mg by mouth daily. 11/05/21  Yes [provider]  carbidopa-levodopa (SINEMET IR) 25-100 MG tablet Take 2 tablets by mouth 4 (  four) times daily. 12/07/18  Yes [provider]  ciprofloxacin (CIPRO) 750 MG tablet Take 750 mg by mouth daily with breakfast. For 7 days 12/09/21 - 12/15/21   Yes [provider]  divalproex (DEPAKOTE) 500 MG DR  tablet Take 500 mg by mouth 2 (two) times daily. 12/11/21  Yes [provider]  docusate sodium (COLACE) 100 MG capsule Take 1 capsule (100 mg total) by mouth 2 (two) times daily. 12/21/21  Yes Nicole Kindred A, DO  guaiFENesin (ROBITUSSIN) 100 MG/5ML liquid Take 10 mLs by mouth every 8 (eight) hours as needed for cough or to loosen phlegm. For 5 days 12/10/21 - 12/14/21   Yes [provider]  hydrALAZINE (APRESOLINE) 100 MG tablet Take 1 tablet (100 mg total) by mouth 3 (three) times daily. Patient taking differently: Take 100 mg by mouth 3 (three) times daily. [id number] 04/22/21 01/11/22 Yes Richarda Osmond, MD  insulin aspart protamine- aspart (NOVOLOG MIX 70/30) (70-30) 100 UNIT/ML injection Inject 0.1 mLs (10 Units total) into the skin 2 (two) times daily with a meal. 12/21/21  Yes Nicole Kindred A, DO  levothyroxine (SYNTHROID, LEVOTHROID) 75 MCG tablet Take 75 mcg by mouth daily before breakfast.   Yes [provider]  megestrol (MEGACE) 20 MG tablet Take 20 mg by mouth daily. 12/11/21  Yes [provider]  Multiple Vitamin (MULTIVITAMIN WITH MINERALS) TABS tablet Take 1 tablet by mouth daily. 12/22/21  Yes Nicole Kindred A, DO  polyethylene glycol (MIRALAX / GLYCOLAX) 17 g packet Take 17 g by mouth daily. Hold if loose or frequent stools 12/21/21  Yes Nicole Kindred A, DO  rivastigmine (EXELON) 1.5 MG capsule Take 1.5 mg by mouth 2 (two) times daily. 03/31/21  Yes [provider]  senna (SENOKOT) 8.6 MG TABS tablet Take 1 tablet (8.6 mg total) by mouth 2 (two) times daily. Hold if loose or frequent stools 12/21/21  Yes Nicole Kindred A, DO  vitamin C (ASCORBIC ACID) 500 MG tablet Take 1,000 mg by mouth daily.   Yes [provider]  alum & mag hydroxide-simeth (MAALOX/MYLANTA) 200-200-20 MG/5ML suspension Take 30 mLs by mouth every 4 (four) hours as needed for indigestion. 12/21/21   Nicole Kindred A, DO  colchicine 0.6 MG tablet Take 1  tablet by mouth daily as needed. 07/04/20   [provider]  feeding supplement, GLUCERNA SHAKE, (GLUCERNA SHAKE) LIQD Take 237 mLs by mouth 3 (three) times daily between meals. 12/21/21   Nicole Kindred A, DO  magnesium hydroxide (MILK OF MAGNESIA) 400 MG/5ML suspension Take 30 mLs by mouth daily as needed for mild constipation or moderate constipation. 12/21/21   [name], DO  methocarbamol (ROBAXIN) 500 MG tablet Take 1 tablet (500 mg total) by mouth every 6 (six) hours as needed for muscle spasms. 12/21/21   Nicole Kindred A, DO  oxyCODONE (OXY IR/ROXICODONE) 5 MG immediate release tablet Take 1 tablet (5 mg total) by mouth every 4 (four) hours as needed for severe pain. 12/21/21   [name], DO    Inpatient Medications: Scheduled Meds:  acetaminophen  1,000 mg Oral Q8H   amLODipine  5 mg Oral Daily   amoxicillin-clavulanate  1 tablet Oral Q12H   aspirin EC  81 mg Oral BID   atenolol  25 mg Oral Daily   atorvastatin  40 mg Oral Daily   carbidopa-levodopa  2 tablet Oral QID   dapagliflozin propanediol  10 mg Oral Daily   divalproex  500 mg Oral  BID   docusate sodium  100 mg Oral BID   enoxaparin (LOVENOX) injection  40 mg Subcutaneous Q24H   feeding supplement (GLUCERNA SHAKE)  237 mL Oral TID BM   furosemide  40 mg Oral Daily   hydrALAZINE  50 mg Oral BID   insulin aspart  0-15 Units Subcutaneous TID WC   irbesartan  37.5 mg Oral Daily   levothyroxine  75 mcg Oral QAC breakfast   megestrol  20 mg Oral Daily   multivitamin with minerals  1 tablet Oral Daily   polyethylene glycol  17 g Oral Daily   rivastigmine  1.5 mg Oral BID   Continuous Infusions:  PRN Meds: acetaminophen, colchicine, guaiFENesin-dextromethorphan, methocarbamol, ondansetron (ZOFRAN) IV, mouth rinse  Allergies:    Allergies  Allergen Reactions   Metformin Diarrhea    Social History:   Social History   Tobacco Use   Smoking status: Never   Smokeless tobacco: Never  Vaping Use    Vaping Use: Never used  Substance Use Topics   Alcohol use: No   Drug use: Never    Family History:   Family History  Problem Relation Age of Onset   Brain cancer Mother    Prostate cancer Neg Hx    Bladder Cancer Neg Hx    Kidney cancer Neg Hx      ROS:  Please see the history of present illness. All other ROS reviewed and negative.     Physical Exam/Data:   Vitals:   01/11/22 1930 01/11/22 1956 01/12/22 0546 01/12/22 0812  BP: (!) 119/52 (!) 109/52 (!) 125/52 (!) 113/53  Pulse: 64 61 64 67  Resp: 16 17 18    Temp: 97.6 F (36.4 C) 97.8 F (36.6 C) (!) 97.5 F (36.4 C) 98.6 F (37 C)  TempSrc: Oral  Oral Oral  SpO2: 92% 96% 93% 95%  Weight:      Height:        Intake/Output Summary (Last 24 hours) at 01/12/2022 1826 Last data filed at 01/12/2022 1055 Gross per 24 hour  Intake 720 ml  Output 550 ml  Net 170 ml      01/11/2022    5:00 AM 01/10/2022    8:17 AM 12/15/2021   10:40 AM  Last 3 Weights  Weight (lbs) 165 lb 5.5 oz 167 lb 167 lb 1.7 oz  Weight (kg) 75 kg 75.751 kg 75.8 kg     Body mass index is 23.72 kg/m.  General:  Well nourished, well developed, in no acute distress HEENT: normal Neck: no JVD Vascular: 2+ radial pulses bilaterally. Cardiac: Regular rate and rhythm without murmurs, rubs, or gallops. Lungs: Diminished breath sounds at both lung bases with faint crackles.  No wheezes. Abd: soft, nontender, no hepatomegaly  Ext: no edema Musculoskeletal:  No deformities, BUE and BLE strength normal and equal Skin: warm and dry  Neuro:  CNs 2-12 intact, no focal abnormalities noted Psych:  Normal affect   EKG (12/14/2021:  The EKG was personally reviewed and demonstrates: Normal sinus rhythm with bifascicular block (RBBB and LAFB). Telemetry:  Telemetry was personally reviewed and demonstrates: Normal sinus rhythm.  Relevant CV Studies: See echocardiogram results below.  Laboratory Data:  High Sensitivity Troponin:   Recent Labs  Lab  12/14/21 1936 12/14/21 2248 01/10/22 0821  TROPONINIHS 27* 23* 39*     Chemistry Recent Labs  Lab 01/10/22 0821 01/11/22 0412 01/12/22 0637  NA 131* 134* 139  K 5.5* 2.9* 3.0*  CL 100 98 100  CO2 20* 26 28  GLUCOSE 153* 210* 157*  BUN 17 16 14   CREATININE 1.01 1.10 1.24  CALCIUM 8.0* 7.8* 7.9*  GFRNONAA >60 >60 58*  ANIONGAP 11 10 11     Recent Labs  Lab 01/10/22 0821  PROT 6.2*  ALBUMIN 2.7*  AST 39  ALT <5  ALKPHOS 109  BILITOT 1.4*   Lipids No results for input(s): "CHOL", "TRIG", "HDL", "LABVLDL", "LDLCALC", "CHOLHDL" in the last 168 hours.  Hematology Recent Labs  Lab 01/10/22 0821  WBC 9.6  RBC 3.18*  HGB 9.7*  HCT 31.0*  MCV 97.5  MCH 30.5  MCHC 31.3  RDW 16.6*  PLT 268   Thyroid  Recent Labs  Lab 01/10/22 1725  TSH 3.402    BNP Recent Labs  Lab 01/10/22 0821  BNP 1,827.4*    DDimer No results for input(s): "DDIMER" in the last 168 hours.   Radiology/Studies:  ECHOCARDIOGRAM COMPLETE  Result Date: 01/12/2022    ECHOCARDIOGRAM REPORT   Patient Name:   CLARK CUFF [name]. Date of Exam: 01/11/2022 Medical Rec #:  [id number]             Height:       70.0 in Accession #:    [id number]            Weight:       165.3 lb Date of Birth:  July 02, 1940             BSA:          1.925 m Patient Age:    85 years              BP:           115/60 mmHg Patient Gender: M                     HR:           69 bpm. Exam Location:  [facility] Procedure: 2D Echo, Cardiac Doppler and Color Doppler Indications:     I50.31 CHF acute diastolic  History:         Patient has prior history of Echocardiogram examinations, most                  recent 05/06/2016. CHF; Risk Factors:Diabetes, Dyslipidemia and                  Hypertension.  Sonographer:     Rosalia Hammers Referring Phys:  [place] Diagnosing Phys: Serafina Royals MD IMPRESSIONS  1. Left ventricular ejection fraction, by estimation, is 30 to 35%. The left ventricle has moderately decreased function. The left  ventricle demonstrates regional wall motion abnormalities (see scoring diagram/findings for description). The left ventricular internal cavity size was moderately dilated. Left ventricular diastolic parameters were normal.  2. Right ventricular systolic function is normal. The right ventricular size is normal.  3. Left atrial size was mildly dilated.  4. Right atrial size was mildly dilated.  5. The mitral valve is normal in structure. Moderate mitral valve regurgitation.  6. Tricuspid valve regurgitation is moderate.  7. The aortic valve is normal in structure. Aortic valve regurgitation is trivial. FINDINGS  Left Ventricle: Left ventricular ejection fraction, by estimation, is 30 to 35%. The left ventricle has moderately decreased function. The left ventricle demonstrates regional wall motion abnormalities. Severe akinesis of the left ventricular, mid-apical septal wall, anteroseptal wall, inferoseptal wall and apical segment. The left ventricular internal cavity size was moderately  dilated. There is no left ventricular hypertrophy. Left ventricular diastolic parameters were normal. Right Ventricle: The right ventricular size is normal. No increase in right ventricular wall thickness. Right ventricular systolic function is normal. Left Atrium: Left atrial size was mildly dilated. Right Atrium: Right atrial size was mildly dilated. Pericardium: There is no evidence of pericardial effusion. Mitral Valve: The mitral valve is normal in structure. Moderate mitral valve regurgitation. Tricuspid Valve: The tricuspid valve is normal in structure. Tricuspid valve regurgitation is moderate. Aortic Valve: The aortic valve is normal in structure. Aortic valve regurgitation is trivial. Aortic valve mean gradient measures 2.0 mmHg. Aortic valve peak gradient measures 4.2 mmHg. Aortic valve area, by VTI measures 2.20 cm. Pulmonic Valve: The pulmonic valve was normal in structure. Pulmonic valve regurgitation is trivial. Aorta:  The aortic root and ascending aorta are structurally normal, with no evidence of dilitation. IAS/Shunts: No atrial level shunt detected by color flow Doppler.  LEFT VENTRICLE PLAX 2D LVIDd:         5.70 cm   Diastology LVIDs:         4.30 cm   LV e' medial:    3.48 cm/s LV PW:         1.40 cm   LV E/e' medial:  20.2 LV IVS:        1.20 cm   LV e' lateral:   6.76 cm/s LVOT diam:     2.30 cm   LV E/e' lateral: 10.4 LV SV:         49 LV SV Index:   26 LVOT Area:     4.15 cm  RIGHT VENTRICLE RV Basal diam:  3.50 cm RV Mid diam:    2.70 cm RV S prime:     10.60 cm/s TAPSE (M-mode): 2.4 cm LEFT ATRIUM              Index        RIGHT ATRIUM           Index LA diam:        4.10 cm  2.13 cm/m   RA Area:     14.70 cm LA Vol (A2C):   104.0 ml 54.02 ml/m  RA Volume:   38.10 ml  19.79 ml/m LA Vol (A4C):   57.3 ml  29.76 ml/m LA Biplane Vol: 77.0 ml  40.00 ml/m  AORTIC VALVE                    PULMONIC VALVE AV Area (Vmax):    2.44 cm     PR Faydra Korman Diast Vel: 8.29 msec AV Area (Vmean):   2.27 cm AV Area (VTI):     2.20 cm AV Vmax:           103.00 cm/s AV Vmean:          69.600 cm/s AV VTI:            0.225 m AV Peak Grad:      4.2 mmHg AV Mean Grad:      2.0 mmHg LVOT Vmax:         60.40 cm/s LVOT Vmean:        38.100 cm/s LVOT VTI:          0.119 m LVOT/AV VTI ratio: 0.53  AORTA Ao Root diam: 3.90 cm MITRAL VALVE               TRICUSPID VALVE MV Area (PHT): 3.42 cm    TR  Peak grad:   36.7 mmHg MV Decel Time: 222 msec    TR Vmax:        303.00 cm/s MV E velocity: 70.20 cm/s MV A velocity: 61.30 cm/s  SHUNTS MV E/A ratio:  1.15        Systemic VTI:  0.12 m                            Systemic Diam: 2.30 cm Serafina Royals MD Electronically signed by Serafina Royals MD Signature Date/Time: 01/12/2022/12:36:20 PM    Final    CT ABDOMEN PELVIS WO CONTRAST  Result Date: 01/10/2022 CLINICAL DATA:  Unintentional weight loss. EXAM: CT ABDOMEN AND PELVIS WITHOUT CONTRAST TECHNIQUE: Multidetector CT imaging of the abdomen and  pelvis was performed following the standard protocol without IV contrast. RADIATION DOSE REDUCTION: This exam was performed according to the departmental dose-optimization program which includes automated exposure control, adjustment of the mA and/or kV according to patient size and/or use of iterative reconstruction technique. COMPARISON:  03/14/2019 FINDINGS: Lower chest: Moderate effusions. Dependent lung base opacity consistent with atelectasis. Bilateral hilar calcifications. Small calcified lung base granuloma. Three-vessel coronary artery calcifications. Hepatobiliary: No focal liver abnormality is seen. Status post cholecystectomy. No biliary dilatation. Pancreas: Pancreatic atrophy.  No mass.  No inflammation. Spleen: Splenic calcifications consistent with healed granuloma. Spleen normal in size. No mass. Adrenals/Urinary Tract: No adrenal masses. No renal masses, stones or hydronephrosis. Normal ureters. Bilobed appearance of the bladder. Anterior superior aspect shows wall thickening and hazy inflammatory change in the adjacent fat. No bladder mass or stone. Stomach/Bowel: Stomach is unremarkable. Small bowel and colon are normal in caliber. No wall thickening. No inflammation. Normal appendix visualized. Vascular/Lymphatic: Dense aortic atherosclerotic calcifications extending to the branch vessels. No aneurysm. No enlarged lymph nodes. Reproductive: Unremarkable. Other: No abdominal wall hernia or ascites. Subcutaneous soft tissue edema of the right hemipelvis and visualized upper right thigh. ORIF of an intertrochanteric right femur fracture, incompletely imaged. This was performed on 12/15/2021. Musculoskeletal: Mild compression deformities of L1 and L2, unchanged. Schmorl's node along the upper endplate of L4, also stable. No acute fractures. No bone lesions. Old left rib fractures. IMPRESSION: 1. Moderate pleural effusions with associated dependent atelectasis. No convincing pneumonia. No evidence  of pulmonary edema. Inferior hilar calcifications and calcified granuloma consistent with healed granulomatous disease, also evident by splenic calcifications. 2. Bladder has a bilobed appearance, which it did on the previous CT. The upper portion of the bladder shows wall thickening and adjacent inflammatory type haziness. Findings suspicious for cystitis. No bladder mass. 3. No other evidence of an acute abnormality within the abdomen or pelvis. No evidence of malignancy. 4. Dense aortic atherosclerosis. 5. Changes from recent right proximal femur fracture ORIF. Electronically Signed   By: Lajean Manes M.D.   On: 01/10/2022 17:10   CT Chest Wo Contrast  Result Date: 01/10/2022 CLINICAL DATA:  Respiratory illness.  Congestion with cough. EXAM: CT CHEST WITHOUT CONTRAST TECHNIQUE: Multidetector CT imaging of the chest was performed following the standard protocol without IV contrast. RADIATION DOSE REDUCTION: This exam was performed according to the departmental dose-optimization program which includes automated exposure control, adjustment of the mA and/or kV according to patient size and/or use of iterative reconstruction technique. COMPARISON:  CT angio chest 04/20/2021 FINDINGS: Cardiovascular: Stable cardiac enlargement. No pericardial effusion. Aortic atherosclerosis and multi vessel coronary artery calcifications. Mediastinum/Nodes: Thyroid gland, trachea and esophagus are unremarkable. Calcified mediastinal and hilar  lymph nodes compatible with prior granulomatous disease. No axillary or mediastinal adenopathy identified. Lungs/Pleura: Moderate bilateral pleural effusions are identified which are increased in volume compared with the previous exam. Airspace consolidation with air bronchograms identified within the right lower lobe, image 60/6 and image 104/2. Partial atelectasis of the right middle lobe. Mild interlobular septal thickening is noted with a lower lung zone gradient. Calcified granular the  right upper lobe. Upper Abdomen: No acute abnormality. Aortic atherosclerosis. Calcified granulomas noted within the spleen. Musculoskeletal: There are multiple healing left rib fractures which are new when compared with the prior exam. There is also a healing right anterior second rib fracture. No signs of acute rib fracture at this time. Sternum appears within normal limits. The vertebral body heights are well maintained. IMPRESSION: 1. Cardiac enlargement, moderate bilateral pleural effusions and mild interstitial edema concerning for CHF. 2. Airspace consolidation with air bronchograms identified within the right lower lobe suspicious for pneumonia. Partial atelectasis of the right middle lobe. 3. Multiple healing bilateral rib fractures. 4. Three vessel coronary artery calcifications. 5. Aortic Atherosclerosis (ICD10-I70.0). Electronically Signed   By: Kerby Moors M.D.   On: 01/10/2022 10:04   DG Chest Portable 1 View  Result Date: 01/10/2022 CLINICAL DATA:  Cough, congestion EXAM: PORTABLE CHEST 1 VIEW COMPARISON:  04/25/2021 FINDINGS: Mild cardiac enlargement with increased vascular congestion and interstitial prominence suggesting developing edema. Pleural effusions noted bilaterally with bibasilar atelectasis/consolidation. Difficult to exclude basilar pneumonia. No pneumothorax. Aorta atherosclerotic. Calcified mediastinal lymph nodes again noted. IMPRESSION: Cardiomegaly with developing interstitial edema pattern. New bilateral pleural effusions and bibasilar atelectasis/consolidation. Electronically Signed   By: Jerilynn Mages.  Shick M.D.   On: 01/10/2022 08:37     Assessment and Plan:   Acute HFrEF: Patient's only symptoms have been cough though his recent chest imaging is consistent with pulmonary edema and pleural effusions.  His echo performed yesterday also shows new cardiomyopathy with LVEF interpreted as 30-35% (on my review, it appears to be even worse).  This cardiomyopathy is new compared to  2018.  Other than diminished breath sounds with faint crackles in both lung fields, the patient appears fairly euvolemic.  He is net -4.5 L since admission.  He has not been weighed today. -Escalate diuresis to furosemide 40 mg IV daily. -Discontinue amlodipine and hydralazine to allow for escalation of irbesartan. -Transition atenolol to carvedilol 3.125 mg twice daily. -Continue dapagliflozin. -Ideally, the patient would undergo right and left heart catheterization to better delineate filling pressures and evaluate for ischemic substrate.  However, given patient's comorbidities, we will need to discuss this further with him and his family.  I have spoken with his wife by phone; we will plan to discuss these findings and our plans in more detail tomorrow when the patient's children will hopefully be present at the bedside.  Coronary artery disease: Patient denies a history of CAD though review of his recent CT chest shows extensive coronary artery calcification and aortic atherosclerosis.  LAD territory wall motion abnormality on recent echocardiogram suggest that he may have infarcted this in the past.  Troponin elevation on admission was negligible and could be seen with heart failure. -Repeat high-sensitivity troponin I to assess trend. -Repeat EKG. -Continue aspirin and atorvastatin.  Carotid artery disease and history of stroke: Patient denies new neurologic changes. -Continue aspirin and statin therapy for secondary prevention.  Right hip fracture status post surgical fixation: -Continue rehabilitation per primary team.  For questions or updates, please contact [place] Please consult [email or web address]  for contact info under [facility] Cardiology.  Signed, Nelva Bush, MD  01/12/2022 6:26 PM

## 2022-01-12 NOTE — Progress Notes (Signed)
Visited patient to give verbal and written heart failure education and information about upcoming appointment with the heart failure clinic. Reviewed ' Living Better with Heart Failure ' booklet with patient, including concepts such as daily weighing, maintaining a low sodium diet, and commons signs and symptoms to look for that should prompt a call to the doctor/heart failure clinic. Patient does have scale at home. Patient's granddaughter was present in the room. She states they would like to keep appointment scheduled for 01/18/22. She states her grandfather will be discharged to [facility].

## 2022-01-12 NOTE — Evaluation (Addendum)
Clinical/Bedside Swallow Evaluation Patient Details  Name: Gilbert BUSCEMI Sr. MRN: [id number] Date of Birth: 1940/08/03  Today's Date: 01/12/2022 Time: SLP Start Time (ACUTE ONLY): 1008 SLP Stop Time (ACUTE ONLY): 1100 SLP Time Calculation (min) (ACUTE ONLY): 52 min  Past Medical History:  Past Medical History:  Diagnosis Date   Acute kidney injury superimposed on CKD ([place]) 04/25/2021   Choledocholithiasis    Chronic cholecystitis    Diabetes mellitus without complication ([place])    Hypertension    Hypertensive urgency 06/10/2019   Parkinson disease ([place])    Stroke [facility])    Past Surgical History:  Past Surgical History:  Procedure Laterality Date   ELBOW SURGERY     ENDOSCOPIC RETROGRADE CHOLANGIOPANCREATOGRAPHY (ERCP) WITH PROPOFOL N/A 06/12/2019   Procedure: ENDOSCOPIC RETROGRADE CHOLANGIOPANCREATOGRAPHY (ERCP) WITH PROPOFOL;  Surgeon: Lucilla Lame, MD;  Location: [facility] ENDOSCOPY;  Service: Endoscopy;  Laterality: N/A;   ERCP N/A 07/10/2019   Procedure: ENDOSCOPIC RETROGRADE CHOLANGIOPANCREATOGRAPHY (ERCP) STENT REMOVAL;  Surgeon: Lucilla Lame, MD;  Location: [facility] ENDOSCOPY;  Service: Endoscopy;  Laterality: N/A;   INTRAMEDULLARY (IM) NAIL INTERTROCHANTERIC Right 12/15/2021   Procedure: INTRAMEDULLARY (IM) NAIL INTERTROCHANTERIC;  Surgeon: Thornton Park, MD;  Location: [facility] ORS;  Service: Orthopedics;  Laterality: Right;   subtotal cholecystectomy  06/13/2019   HPI:  Pt is an 81 y/o with Parkinson's Dis, s/p CVA ~5 years ago with aphasia, DM, Hypothyroidism, chronic HFpEF, recent Falls in ~2 months resulting in broken ribs and ORIF right intratrochanteric fx with blood loss anemia, who was noted by SNF staff to have cough today. With concern for respiratory issues patient transported to [facility]ED for further evaluation.  Per Dietician note,  per H&P, pt with decreased appetite as well as decreased sense of taste and smell since COVID-19 diagnosis approximately 1 year ago; also weight  loss in recent 9 months.  CXR cardiomegaly, interstial edema, bilateral pleural effusion. CT chest - moderate pleural effusion, pulmonary edema, ASD with air-bronchogram RLL. TRH called to admit to manage acute HFpEF, aspiration PNA and co-morbidities. Patient was placed on diuretic therapy. Patient admitted from rehab where he had been discharged after fall sustaining hip fracture.  He had progressed to walking about 50 to 60 feet.   Assessment / Plan / Recommendation  Clinical Impression   Pt seen for BSE this morning. Pt was awake, verbal w/ family and staff w/ humor and smiling also. Pt followed instructions. Min Dysarthria at baseline; pt has Parkinson's Dis. and loose-fitting Dentures(which were secured using adhesive per recommendation).  Pt is on RA, afebrile. Dentures present.  Pt appears to present w/ grossly adequate oropharyngeal phase swallow w/ no impacting oropharyngeal phase dysphagia noted. Pt consumed po trials w/ No overt, clinical s/s of aspiration during po trials. Pt appears at reduced risk for aspiration when following general aspiration precautions, given support w/ setup, and using a soft/cut/moistened foods diet.  However, pt does have challenging factors that could impact his oropharyngeal swallowing to include Cognitive decline per report and a baseline of Parkinson's Dis.(on sinemet). He exhibits Mild+ Dysarthria, suspect impact from PD and loose-fitting Dentures. Noted pt has has weight loss in recent 9 months per Dietician note. He has also had Falls resulting in hip and rib fxs now recovering from IRF. Pt appears to present w/ fatigue/weakness and advanced age. All of these factors can increase risk for aspiration, dysphagia as well as decreased oral intake overall.   During po trials, pt consumed all consistencies w/ no overt coughing, decline in vocal quality,  or change in respiratory presentation during/post trials. Oral phase appeared grossly [facility] w/ timely bolus  management and control of bolus propulsion for A-P transfer for swallowing. Mastication time for solids was min increased but given Time, pt was able to complete mastication and swallow. Oral clearing achieved w/ all trial consistencies -- strongly suggested moistened, soft foods for ease of mastication.  OM Exam revealed no unilateral labial weakness, however, noted Right lingual unilateral weakness and deviation. MD alerted to this finding. Speech was reduced in articulatory precision -- suspect most impact from Dentures and PD. Family in room stated speech was much improved w/ the Lower Dentures secure in his mouth and "not moving around anymore".  Pt fed self w/ setup support but needed full setup support and positioning support. Needed support w/ Dentures.   Recommend continue a Regular consistency diet(per pt choice for meal items) BUT, w/ well-Cut meats/foods, moistened foods; Thin liquids -- Cup recommended when drinking. Tray setup and support at meals as needed. Recommend general aspiration precautions, Pills WHOLE in Puree for safer, easier swallowing.  Education given on Pills in Puree; food consistencies and easy to eat options; general aspiration precautions to pt and Family present in room. Instruction given on incentive spirometer also for Pulmonary exercise. Informed pt and Family of potential f/u w/ ST services for LOUD tx targeting breathing/speech d/t impact from Parkinson's Dis. Recommend Dietician f/u for support. NSG to reconsult if any new needs arise. NSG updated, agreed. MD updated.  SLP Visit Diagnosis: Dysphagia, unspecified (R13.10) (baseline of Parkinson's Dis)    Aspiration Risk  Mild aspiration risk;Risk for inadequate nutrition/hydration (baseline of Parkinson's Dis)    Diet Recommendation   Regular consistency diet(per pt choice for meal items) BUT, w/ well-Cut meats/foods, moistened foods; Thin liquids -- Cup recommended when drinking. Tray setup and support at meals as  needed. Recommend general aspiration precautions.  Medication Administration: Whole meds with puree    Other  Recommendations Recommended Consults:  (Dietician f/u) Oral Care Recommendations: Oral care BID;Oral care before and after PO;Patient independent with oral care (setup) Other Recommendations:  (n/a)    Recommendations for follow up therapy are one component of a multi-disciplinary discharge planning process, led by the attending physician.  Recommendations may be updated based on patient status, additional functional criteria and insurance authorization.  Follow up Recommendations No SLP follow up      Assistance Recommended at Discharge Intermittent Supervision/Assistance (Cognitive decline per report; baseline of Parkinson's Dis)  Functional Status Assessment Patient has had a recent decline in their functional status and demonstrates the ability to make significant improvements in function in a reasonable and predictable amount of time.  Frequency and Duration  (n/a)   (n/a)       Prognosis Prognosis for Safe Diet Advancement: Fair (-Good) Barriers to Reach Goals: Time post onset;Severity of deficits Barriers/Prognosis Comment: impact of Cognitive decline per report; baseline of Parkinson's Dis      Swallow Study   General Date of Onset: 01/10/22 HPI: Pt is an 81 y/o with Parkinson's Dis, s/p CVA ~5 years ago with aphasia, DM, Hypothyroidism, chronic HFpEF, recent Falls in ~2 months resulting in broken ribs and ORIF right intratrochanteric fx with blood loss anemia, who was noted by SNF staff to have cough today. With concern for respiratory issues patient transported to [facility] for further evaluation.  Per Dietician note,  per H&P, pt with decreased appetite as well as decreased sense of taste and smell since COVID-19 diagnosis approximately 1 year  ago; also weight loss in recent 9 months.  CXR cardiomegaly, interstial edema, bilateral pleural effusion. CT chest - moderate  pleural effusion, mild pulmonary edema, ASD with air-bronchogram RLL. TRH called to admit to manage acute HFpEF, aspiration PNA and co-morbidities. Type of Study: Bedside Swallow Evaluation Previous Swallow Assessment: 2018 Diet Prior to this Study: Regular;Thin liquids Temperature Spikes Noted: No (wbc 9.6) Respiratory Status: Room air History of Recent Intubation: No Behavior/Cognition: Alert;Cooperative;Pleasant mood;Distractible;Requires cueing (mild+ Dysarthria) Oral Cavity Assessment: Excessive secretions (min) Oral Care Completed by SLP: Yes Oral Cavity - Dentition: Dentures, top;Dentures, bottom (loose-fitting) Vision: Functional for self-feeding Self-Feeding Abilities: Able to feed self;Needs assist;Needs set up Patient Positioning: Upright in bed (needed support w/ positioning) Baseline Vocal Quality: Low vocal intensity (min) Volitional Cough: Congested (Fair effort) Volitional Swallow: Able to elicit    Oral/Motor/Sensory Function Overall Oral Motor/Sensory Function: Mild impairment (-Moderate) Facial ROM: Within Functional Limits Facial Symmetry: Within Functional Limits Facial Strength: Within Functional Limits Facial Sensation: Within Functional Limits Lingual ROM: Within Functional Limits (grossly) Lingual Symmetry: Abnormal symmetry right;Suspected CN XII (hypoglossal) dysfunction (deviation to the right -- unsure if new or related to old CVA) Lingual Strength: Reduced;Suspected CN XII (hypoglossal) dysfunction Mandible: Within Functional Limits   Ice Chips Ice chips: Within functional limits Presentation: Spoon (fed; 2 trials)   Thin Liquid Thin Liquid: Within functional limits Presentation: Cup;Self Fed (5-6 trials) Other Comments: does not like water; nor cold drinks    Nectar Thick Nectar Thick Liquid: Not tested   Honey Thick Honey Thick Liquid: Not tested   Puree Puree: Within functional limits Presentation: Self Fed;Spoon (7-8 trials)   Solid     Solid:  Within functional limits (grossly) Presentation: Self Fed (4-5 trials) Other Comments: ensured that bottom denture plate was secure w/ min adhesive b/f trials of solids/mastication        Orinda Kenner, MS, CCC-SLP Speech Language Pathologist Rehab Services; [place] [phone] (ascom) [place] 01/12/2022,4:24 PM

## 2022-01-12 NOTE — TOC Initial Note (Signed)
Transition of Care (TOC) - Initial/Assessment Note    Patient Details  Name: Gilbert CHAUCA Sr. MRN: [id number] Date of Birth: 1940/05/17  Transition of Care [facility]) CM/SW Contact:    Alberteen Sam, LCSW Phone Number: 01/12/2022, 2:42 PM  Clinical Narrative:                  CSW notes patient from [facility] short term rehab, per spouse she is aware that PT is recommending SNF and agreeable for patient to return to [facility] at discharge if he's unable to improve enough to dc home.   TOC will continue to follow for medical readiness to dc to [facility].   Expected Discharge Plan: Skilled Nursing Facility Barriers to Discharge: Continued Medical Work up   Patient Goals and CMS Choice Patient states their goals for this hospitalization and ongoing recovery are:: to go home CMS Medicare.gov Compare Post Acute Care list provided to:: Patient Represenative (must comment) (spouse) Choice offered to / list presented to : Spouse  Expected Discharge Plan and Services Expected Discharge Plan: [place]       Living arrangements for the past 2 months: Single Family Home Expected Discharge Date: 01/15/22                                    Prior Living Arrangements/Services Living arrangements for the past 2 months: Single Family Home                     Activities of Daily Living Home Assistive Devices/Equipment: Environmental consultant (specify type) ADL Screening (condition at time of admission) Patient's cognitive ability adequate to safely complete daily activities?: No Is the patient deaf or have difficulty hearing?: Yes Does the patient have difficulty seeing, even when wearing glasses/contacts?: No Does the patient have difficulty concentrating, remembering, or making decisions?: Yes Patient able to express need for assistance with ADLs?: Yes Does the patient have difficulty dressing or bathing?: Yes Independently performs ADLs?:  No Communication: Independent Dressing (OT): Needs assistance Is this a change from baseline?: Change from baseline, expected to last >3 days Grooming: Needs assistance Feeding: Needs assistance Is this a change from baseline?: Change from baseline, expected to last >3 days Bathing: Needs assistance Is this a change from baseline?: Change from baseline, expected to last >3 days Toileting: Needs assistance Is this a change from baseline?: Change from baseline, expected to last >3days In/Out Bed: Needs assistance Is this a change from baseline?: Change from baseline, expected to last >3 days Walks in Home: Needs assistance Is this a change from baseline?: Change from baseline, expected to last >3 days Does the patient have difficulty walking or climbing stairs?: Yes Weakness of Legs: Both Weakness of Arms/Hands: None  Permission Sought/Granted                  Emotional Assessment              Admission diagnosis:  Acute on chronic diastolic heart failure ([place]) [I50.33] Type 2 diabetes mellitus without complication, with long-term current use of insulin ([place]) [E11.9, Z79.4] Community acquired pneumonia, unspecified laterality [J18.9] Congestive heart failure, unspecified HF chronicity, unspecified heart failure type ([place]) [I50.9] Patient Active Problem List   Diagnosis Date Noted   Acute on chronic diastolic CHF (congestive heart failure) ([place]) 01/10/2022   Aspiration pneumonia ([place]) 01/10/2022   Weight loss, abnormal 01/10/2022  End of life care 01/10/2022   Hypomagnesemia 12/16/2021   ABLA (acute blood loss anemia) 12/16/2021   Hyponatremia 12/15/2021   Closed right hip fracture ([place]) 12/14/2021   Dyslipidemia 12/14/2021   Hypokalemia 12/14/2021   Generalized weakness    Fall 04/20/2021   Right rib fracture 04/20/2021   Acute respiratory failure with hypoxia ([place]) 04/20/2021   Elevated troponin 04/20/2021   HTN (hypertension) 04/20/2021   HLD (hyperlipidemia)  04/20/2021   Hypothyroidism 04/20/2021   Chronic diastolic CHF (congestive heart failure) ([place]) 04/20/2021   Parkinson disease ([place])    CKD (chronic kidney disease), stage IIIa    Type II diabetes mellitus with renal manifestations ([place]) 06/10/2019   Abnormal findings on diagnostic imaging of gall bladder 06/10/2019   Obstructive jaundice 06/10/2019   Abdominal pain 06/10/2019   History of CVA (cerebrovascular accident) without residual deficits 06/10/2019   Hypoglycemia associated with type 2 diabetes mellitus ([place]) 06/10/2019   Dehydration 12/10/2018   Seizure disorder ([place])    Expressive aphasia    CVA (cerebral vascular accident) ([place]) 05/05/2016   PCP:  [name], MD Pharmacy:   [facility], [place] [address] Phone: [phone] Fax: [place], [place] - [address] [place] [address] Phone: [phone] Fax: [place], [place] - [place] [address] [place] [address] Phone: [phone] Fax: [phone]     Social Determinants of Health ([place]) Interventions    Readmission Risk Interventions     No data to display

## 2022-01-13 DIAGNOSIS — I5033 Acute on chronic diastolic (congestive) heart failure: Secondary | ICD-10-CM | POA: Diagnosis not present

## 2022-01-13 LAB — BASIC METABOLIC PANEL
Anion gap: 10 (ref 5–15)
BUN: 16 mg/dL (ref 8–23)
CO2: 26 mmol/L (ref 22–32)
Calcium: 8.1 mg/dL — ABNORMAL LOW (ref 8.9–10.3)
Chloride: 103 mmol/L (ref 98–111)
Creatinine, Ser: 1.15 mg/dL (ref 0.61–1.24)
GFR, Estimated: >60 mL/min (ref >60)
Glucose, Bld: 164 mg/dL — ABNORMAL HIGH (ref 70–99)
Potassium: 3.4 mmol/L — ABNORMAL LOW (ref 3.5–5.1)
Sodium: 139 mmol/L (ref 135–145)

## 2022-01-13 LAB — GLUCOSE, CAPILLARY
Glucose-Capillary: 200 mg/dL — ABNORMAL HIGH (ref 70–99)
Glucose-Capillary: 206 mg/dL — ABNORMAL HIGH (ref 70–99)
Glucose-Capillary: 160 mg/dL — ABNORMAL HIGH (ref 70–99)
Glucose-Capillary: 150 mg/dL — ABNORMAL HIGH (ref 70–99)

## 2022-01-13 LAB — TROPONIN I (HIGH SENSITIVITY): Troponin I (High Sensitivity): 39 ng/L — ABNORMAL HIGH (ref <18)

## 2022-01-13 LAB — MAGNESIUM: Magnesium: 1.8 mg/dL (ref 1.7–2.4)

## 2022-01-13 MED ORDER — IRBESARTAN 150 MG PO TABS
75.0000 mg | ORAL_TABLET | Freq: Every day | ORAL | Status: DC
  Administered 2022-01-13 – 2022-01-15 (×3): 75 mg via ORAL
  Filled 2022-01-13 (×3): qty 1

## 2022-01-13 MED ORDER — POTASSIUM CHLORIDE CRYS ER 20 MEQ PO TBCR
40.0000 meq | EXTENDED_RELEASE_TABLET | Freq: Once | ORAL | Status: AC
  Administered 2022-01-13: 40 meq via ORAL
  Filled 2022-01-13: qty 2

## 2022-01-13 NOTE — Progress Notes (Signed)
PT Cancellation Note  Patient Details Name: Gilbert [name] Sr. MRN: [id number] DOB: [birth year]   Cancelled Treatment:     PT treatment not provided 2/2 Pt received in bed and refused to participate in PT skilled interventions because pt is fatigued due to having a very large BM and needed cleaning. Pt educated about the significance of therapy and functional gains but continued to refuse. Pt showed desire to receive therapy tomorrow. PT will attempt again tomorrow.   Joaquin Music PT DPT 3:32 PM,01/13/22

## 2022-01-13 NOTE — TOC Progression Note (Signed)
Transition of Care (TOC) - Progression Note    Patient Details  Name: Gilbert [name]. MRN: [id number] Date of Birth: February 18, 1941  Transition of Care [facility]) CM/SW [place], [place] Phone Number: 01/13/2022, 10:56 AM  Clinical Narrative:     Per MD patient nearing medical readiness to dc to [facility], CSW has initiated insurance authorization with [employer] [place], [place] at [place] updated.   Expected Discharge Plan: [place] Barriers to Discharge: Continued Medical Work up  Expected Discharge Plan and Services Expected Discharge Plan: [place] arrangements for the past 2 months: Single Family Home Expected Discharge Date: 01/15/22                                     Social Determinants of Health (SDOH) Interventions    Readmission Risk Interventions     No data to display

## 2022-01-13 NOTE — NC FL2 (Signed)
[place] LEVEL OF CARE SCREENING TOOL     IDENTIFICATION  Patient Name: Gilbert [name]. Birthdate: Jul 15, 1940 Sex: male Admission Date (Current Location): 01/11/2022  [facility] and [place] Number:  Engineering geologist and Address:  [facility], [address], [place], [place] [address]      Provider Number: [id number]  Attending Physician Name and Address:  No att. providers found  Relative Name and Phone Number:  Mora Appl Wife [phone]    Current Level of Care: Hospital Recommended Level of Care: [place] Prior Approval Number:    Date Approved/Denied: 01/13/22 PASRR Number: [id number]  Discharge Plan: SNF    Current Diagnoses: Patient Active Problem List   Diagnosis Date Noted   Acute on chronic systolic CHF (congestive heart failure) ([place]) 01/10/2022   Aspiration pneumonia ([place]) 01/10/2022   Weight loss, abnormal 01/10/2022   Goals of care, counseling/discussion 01/10/2022   Hypomagnesemia 12/16/2021   ABLA (acute blood loss anemia) 12/16/2021   Hyponatremia 12/15/2021   Closed right hip fracture ([place]) 12/14/2021   Dyslipidemia 12/14/2021   Hypokalemia 12/14/2021   Generalized weakness    Fall 04/20/2021   Right rib fracture 04/20/2021   Acute respiratory failure with hypoxia ([place]) 04/20/2021   Elevated troponin 04/20/2021   HTN (hypertension) 04/20/2021   HLD (hyperlipidemia) 04/20/2021   Hypothyroidism 04/20/2021   Chronic diastolic CHF (congestive heart failure) ([place]) 04/20/2021   Parkinson disease ([place])    CKD (chronic kidney disease), stage IIIa    Type II diabetes mellitus with renal manifestations ([place]) 06/10/2019   Abnormal findings on diagnostic imaging of gall bladder 06/10/2019   Obstructive jaundice 06/10/2019   Abdominal pain 06/10/2019   History of CVA (cerebrovascular accident) without residual deficits 06/10/2019   Hypoglycemia associated with type 2 diabetes mellitus ([place])  06/10/2019   Dehydration 12/10/2018   Seizure disorder ([place])    Expressive aphasia    CVA (cerebral vascular accident) ([place]) 05/05/2016    Orientation RESPIRATION BLADDER Height & Weight     Self, Place, Situation  Normal Continent Weight:   Height:     BEHAVIORAL SYMPTOMS/MOOD NEUROLOGICAL BOWEL NUTRITION STATUS      Continent Diet (see discharge summary)  AMBULATORY STATUS COMMUNICATION OF NEEDS Skin   Extensive Assist Verbally Normal                       Personal Care Assistance Level of Assistance  Bathing, Feeding, Dressing, Total care Bathing Assistance: Limited assistance Feeding assistance: Limited assistance Dressing Assistance: Maximum assistance     Functional Limitations Info  Sight, Speech, Hearing Sight Info: Adequate Hearing Info: Adequate Speech Info: Adequate    SPECIAL CARE FACTORS FREQUENCY        PT Frequency: 5 times per week OT Frequency: 5 times per week            Contractures Contractures Info: Present    Additional Factors Info  Code Status, Allergies Code Status Info: full Allergies Info: metformin           Current Medications (01/13/2022):  This is the current hospital active medication list No current facility-administered medications for this encounter.   No current outpatient medications on file.   Facility-Administered Medications Ordered in Other Encounters  Medication Dose Route Frequency Provider Last Rate Last Admin   acetaminophen (TYLENOL) tablet 1,000 mg  1,000 mg Oral Q8H Norins, Heinz Knuckles, MD   1,000 mg at 01/13/22 1423   acetaminophen (TYLENOL) tablet  650 mg  650 mg Oral Q4H PRN Norins, Heinz Knuckles, MD       amoxicillin-clavulanate (AUGMENTIN) 875-125 MG per tablet 1 tablet  1 tablet Oral Q12H Nicole Kindred A, DO   1 tablet at 01/13/22 9326   aspirin EC tablet 81 mg  81 mg Oral BID Neena Rhymes, MD   81 mg at 01/13/22 1000   atorvastatin (LIPITOR) tablet 40 mg  40 mg Oral Daily Norins, Heinz Knuckles, MD    40 mg at 01/13/22 7124   carbidopa-levodopa (SINEMET IR) 25-100 MG per tablet immediate release 2 tablet  2 tablet Oral QID Neena Rhymes, MD   2 tablet at 01/13/22 1423   carvedilol (COREG) tablet 6.25 mg  6.25 mg Oral BID WC End, Christopher, MD   6.25 mg at 01/13/22 5809   colchicine tablet 0.6 mg  0.6 mg Oral Daily PRN Norins, Heinz Knuckles, MD       dapagliflozin propanediol (FARXIGA) tablet 10 mg  10 mg Oral Daily Norins, Heinz Knuckles, MD   10 mg at 01/13/22 1006   divalproex (DEPAKOTE) DR tablet 500 mg  500 mg Oral BID Neena Rhymes, MD   500 mg at 01/13/22 1001   docusate sodium (COLACE) capsule 100 mg  100 mg Oral BID Neena Rhymes, MD   100 mg at 01/12/22 2112   enoxaparin (LOVENOX) injection 40 mg  40 mg Subcutaneous Q24H Neena Rhymes, MD   40 mg at 01/12/22 2112   feeding supplement (GLUCERNA SHAKE) (GLUCERNA SHAKE) liquid 237 mL  237 mL Oral TID BM Norins, Heinz Knuckles, MD   237 mL at 01/12/22 2001   furosemide (LASIX) injection 40 mg  40 mg Intravenous Daily End, Christopher, MD   40 mg at 01/13/22 1012   guaiFENesin-dextromethorphan (ROBITUSSIN DM) 100-10 MG/5ML syrup 5 mL  5 mL Oral Q4H PRN Norins, Heinz Knuckles, MD   5 mL at 01/11/22 0130   insulin aspart (novoLOG) injection 0-15 Units  0-15 Units Subcutaneous TID WC Norins, Heinz Knuckles, MD   5 Units at 01/13/22 1221   irbesartan (AVAPRO) tablet 75 mg  75 mg Oral Daily End, Christopher, MD   75 mg at 01/13/22 9833   levothyroxine (SYNTHROID) tablet 75 mcg  75 mcg Oral QAC breakfast Neena Rhymes, MD   75 mcg at 01/13/22 0515   megestrol (MEGACE) tablet 20 mg  20 mg Oral Daily Norins, Heinz Knuckles, MD   20 mg at 01/13/22 1001   methocarbamol (ROBAXIN) tablet 500 mg  500 mg Oral Q6H PRN Norins, Heinz Knuckles, MD       multivitamin with minerals tablet 1 tablet  1 tablet Oral Daily Arrien, Jimmy Picket, MD   1 tablet at 01/13/22 1000   ondansetron (ZOFRAN) injection 4 mg  4 mg Intravenous Q6H PRN Norins, Heinz Knuckles, MD       Oral care  mouth rinse  15 mL Mouth Rinse PRN Norins, Heinz Knuckles, MD       polyethylene glycol (MIRALAX / GLYCOLAX) packet 17 g  17 g Oral Daily Norins, Heinz Knuckles, MD   17 g at 01/12/22 8250   rivastigmine (EXELON) capsule 1.5 mg  1.5 mg Oral BID Norins, Heinz Knuckles, MD   1.5 mg at 01/13/22 1000     Discharge Medications: Please see discharge summary for a list of discharge medications.  Relevant Imaging Results:  Relevant Lab Results:   Additional Information SSN: [id number]  Alberteen Sam, LCSW

## 2022-01-13 NOTE — Evaluation (Signed)
Occupational Therapy Evaluation Patient Details Name: Gilbert GOETHE Sr. MRN: [id number] DOB: [birth year] Today's Date: 01/13/2022   History of Present Illness Gilbert [name], an 81 y/o with Parkinson's, s/p CVA with aphasia, DM, Hypothyroidism, chronic HFpEF, recent ORIF right intratrochanteric fx with blood loss anemia was noted by SNF staff to have cough today. With concern for respiratory issues and suspected pneumonia by medical staff at his care facility, patient transported to [facility] for further evaluation. Patient is currently on antibiotics for UTI.   Clinical Impression   Pt was seen for OT treatment on this date. Upon arrival to room pt awake/alert, semi-supine in bed, agreeable to OT tx session. Pt generally oriented to self, place, and situation. He is agreeable to OT tx session. Pt found to have had episode of BM and requires assistance MAX A with LB clean up/peri-care. He is able to perform STS t/f x3 with initial MAX A to maintain standing balance. Pt is able to progress to MOD A during session and performs step pivot transfer to room recliner with MOD A and cues for safety/sequencing. Pt educated on safety and falls prevention t/o session. No family present for education this date. Pt making good progress toward goals and continues to benefit from skilled OT services to maximize return to PLOF and minimize risk of future falls, injury, caregiver burden, and readmission. Will continue to follow POC. Discharge recommendation remains appropriate.        Recommendations for follow up therapy are one component of a multi-disciplinary discharge planning process, led by the attending physician.  Recommendations may be updated based on patient status, additional functional criteria and insurance authorization.   Follow Up Recommendations  Skilled nursing-short term rehab (<3 hours/day)    Assistance Recommended at Discharge Frequent or constant Supervision/Assistance  Patient can  return home with the following A lot of help with walking and/or transfers;A lot of help with bathing/dressing/bathroom;Assistance with cooking/housework;Assist for transportation;Help with stairs or ramp for entrance    Functional Status Assessment  Patient has had a recent decline in their functional status and demonstrates the ability to make significant improvements in function in a reasonable and predictable amount of time.  Equipment Recommendations  Other (comment) (TBD at next venue of care.)    Recommendations for Other Services       Precautions / Restrictions Precautions Precautions: Fall Restrictions Weight Bearing Restrictions: No RLE Weight Bearing: Weight bearing as tolerated      Mobility Bed Mobility Overal bed mobility: Needs Assistance Bed Mobility: Supine to Sit, Sit to Supine     Supine to sit: Mod assist, HOB elevated     General bed mobility comments: Increased time/effort to perfom sup>sit. Requires MOD A to for trunk elevation.    Transfers Overall transfer level: Needs assistance Equipment used: Rolling walker (2 wheels) Transfers: Sit to/from Stand, Bed to chair/wheelchair/BSC Sit to Stand: Mod assist, Max assist     Step pivot transfers: Mod assist     General transfer comment: Initial MAX A to maintain static standing balance. Able to progress to MOD A with multiple standing attepmpts. Pt elects to step pivot to recliner with MOD A and max multimodal cues for safety.      Balance Overall balance assessment: Needs assistance, History of Falls Sitting-balance support: Bilateral upper extremity supported, Feet supported Sitting balance-Leahy Scale: Good     Standing balance support: Bilateral upper extremity supported, During functional activity, Reliant on assistive device for balance Standing balance-Leahy Scale: Poor Standing balance comment:  Requires at least MOD A to maintain safe standing balance with multiple posterior weight shifts  requiring support to safely lower onto seated surface.                           ADL either performed or assessed with clinical judgement   ADL Overall ADL's : Needs assistance/impaired                             Toileting- Clothing Manipulation and Hygiene: Maximal assistance;Sit to/from stand;Cueing for safety;With adaptive equipment Toileting - Clothing Manipulation Details (indicate cue type and reason): MAX A for peri-care in standing position with RW after pt found soiled with BM. He requires multiple seated rest breaks during clean up.     Functional mobility during ADLs: Rolling walker (2 wheels);Moderate assistance;Maximal assistance General ADL Comments: Pt requires MOD A for bed mobility, MAX A progressing to MOD A for STS and SPT to recliner this session. He requires consistent cueing for hand/foot placement with STS attempts.     Vision Patient Visual Report: No change from baseline       Perception     Praxis      Pertinent Vitals/Pain Pain Assessment Pain Assessment: No/denies pain Pain Intervention(s): Monitored during session     Hand Dominance     Extremity/Trunk Assessment             Communication     Cognition Arousal/Alertness: Awake/alert Behavior During Therapy: WFL for tasks assessed/performed, Flat affect Overall Cognitive Status: Within Functional Limits for tasks assessed                                       General Comments       Exercises Other Exercises Other Exercises: OT facilitated bed mobility, peri-care from STS, bedding change, and functional transfer to room recliner with education of safety and compensatory strategies t/o session.   Shoulder Instructions      Home Living                                          Prior Functioning/Environment                          OT Problem List: Decreased strength;Decreased range of motion;Decreased activity  tolerance;Impaired balance (sitting and/or standing);Decreased cognition;Decreased knowledge of use of DME or AE      OT Treatment/Interventions: Self-care/ADL training;Therapeutic exercise;Patient/family education;Balance training;Therapeutic activities;DME and/or AE instruction    OT Goals(Current goals can be found in the care plan section) Acute Rehab OT Goals Patient Stated Goal: To go home OT Goal Formulation: With patient Time For Goal Achievement: 01/25/22 Potential to Achieve Goals: Good  OT Frequency: Min 2X/week    Co-evaluation              AM-PAC OT "6 Clicks" Daily Activity     Outcome Measure Help from another person eating meals?: None Help from another person taking care of personal grooming?: A Little Help from another person toileting, which includes using toliet, bedpan, or urinal?: A Lot Help from another person bathing (including washing, rinsing, drying)?: A Lot Help from another person to put on and taking off  regular upper body clothing?: A Little Help from another person to put on and taking off regular lower body clothing?: A Lot 6 Click Score: 16   End of Session Equipment Utilized During Treatment: Rolling walker (2 wheels)  Activity Tolerance: Patient tolerated treatment well Patient left: in chair;with call bell/phone within reach  OT Visit Diagnosis: Unsteadiness on feet (R26.81);Muscle weakness (generalized) (M62.81);History of falling (Z91.81) Pain - Right/Left: Right Pain - part of body: Hip;Leg                Time: 1344-1411 OT Time Calculation (min): 27 min Charges:  OT General Charges $OT Visit: 1 Visit OT Treatments $Self Care/Home Management : 23-37 mins  Shara Blazing, M.S., OTR/L Ascom: [phone] 01/13/22, 3:48 PM

## 2022-01-13 NOTE — Progress Notes (Signed)
Rounding Note    Patient Name: Gilbert PLOUFF Sr. Date of Encounter: 01/13/2022  [facility] Cardiologist: Nelva Bush, MD   Subjective   Patient seen on a.m. rounds.  Denies any current chest pain or shortness of breath.  Family remains at the bedside.  -3.6 L output in the last 24 hours  Inpatient Medications    Scheduled Meds:  acetaminophen  1,000 mg Oral Q8H   amoxicillin-clavulanate  1 tablet Oral Q12H   aspirin EC  81 mg Oral BID   atorvastatin  40 mg Oral Daily   carbidopa-levodopa  2 tablet Oral QID   carvedilol  6.25 mg Oral BID WC   dapagliflozin propanediol  10 mg Oral Daily   divalproex  500 mg Oral BID   docusate sodium  100 mg Oral BID   enoxaparin (LOVENOX) injection  40 mg Subcutaneous Q24H   feeding supplement (GLUCERNA SHAKE)  237 mL Oral TID BM   furosemide  40 mg Intravenous Daily   insulin aspart  0-15 Units Subcutaneous TID WC   irbesartan  75 mg Oral Daily   levothyroxine  75 mcg Oral QAC breakfast   megestrol  20 mg Oral Daily   multivitamin with minerals  1 tablet Oral Daily   polyethylene glycol  17 g Oral Daily   rivastigmine  1.5 mg Oral BID   Continuous Infusions:  PRN Meds: acetaminophen, colchicine, guaiFENesin-dextromethorphan, methocarbamol, ondansetron (ZOFRAN) IV, mouth rinse   Vital Signs    Vitals:   01/13/22 0500 01/13/22 0816 01/13/22 1150 01/13/22 1210  BP:  129/87 132/70 132/61  Pulse:  81 78 79  Resp:  (!) 22 18 16   Temp:  98.2 F (36.8 C) 98.4 F (36.9 C) 98.2 F (36.8 C)  TempSrc:  Oral Oral Oral  SpO2:  95%  95%  Weight: 71.4 kg     Height:        Intake/Output Summary (Last 24 hours) at 01/13/2022 1641 Last data filed at 01/13/2022 1222 Gross per 24 hour  Intake 237 ml  Output 3900 ml  Net -3663 ml      01/13/2022    5:00 AM 01/11/2022    5:00 AM 01/10/2022    8:17 AM  Last 3 Weights  Weight (lbs) 157 lb 6.5 oz 165 lb 5.5 oz 167 lb  Weight (kg) 71.4 kg 75 kg 75.751 kg       Telemetry    Sinus rhythm rate of 60s with PACs and PVCs- Personally Reviewed  ECG    No new tracings- Personally Reviewed  Physical Exam   GEN: No acute distress.  Sitting upright in bed Neck: No JVD Cardiac: RRR, no murmurs, rubs, or gallops.  Respiratory: Diminished with faint crackles in the bilateral bases to auscultation bilaterally.  Respirations are unlabored on room air GI: Soft, nontender, non-distended  MS: No edema; No deformity. Neuro:  Nonfocal  Psych: Normal affect   Labs    High Sensitivity Troponin:   Recent Labs  Lab 12/14/21 1936 12/14/21 2248 01/10/22 0821 01/13/22 0449  TROPONINIHS 27* 23* 39* 39*     Chemistry Recent Labs  Lab 01/10/22 0821 01/11/22 0412 01/12/22 0637 01/13/22 0449  NA 131* 134* 139 139  K 5.5* 2.9* 3.0* 3.4*  CL 100 98 100 103  CO2 20* 26 28 26   GLUCOSE 153* 210* 157* 164*  BUN 17 16 14 16   CREATININE 1.01 1.10 1.24 1.15  CALCIUM 8.0* 7.8* 7.9* 8.1*  MG  --   --   --  1.8  PROT 6.2*  --   --   --   ALBUMIN 2.7*  --   --   --   AST 39  --   --   --   ALT <5  --   --   --   ALKPHOS 109  --   --   --   BILITOT 1.4*  --   --   --   GFRNONAA >60 >60 58* >60  ANIONGAP 11 10 11 10     Lipids No results for input(s): "CHOL", "TRIG", "HDL", "LABVLDL", "LDLCALC", "CHOLHDL" in the last 168 hours.  Hematology Recent Labs  Lab 01/10/22 0821  WBC 9.6  RBC 3.18*  HGB 9.7*  HCT 31.0*  MCV 97.5  MCH 30.5  MCHC 31.3  RDW 16.6*  PLT 268   Thyroid  Recent Labs  Lab 01/10/22 1725  TSH 3.402    BNP Recent Labs  Lab 01/10/22 0821  BNP 1,827.4*    DDimer No results for input(s): "DDIMER" in the last 168 hours.   Radiology    No results found.  Cardiac Studies  TTE 01/11/22 1. Left ventricular ejection fraction, by estimation, is 30 to 35%. The  left ventricle has moderately decreased function. The left ventricle  demonstrates regional wall motion abnormalities (see scoring  diagram/findings for  description). The left  ventricular internal cavity size was moderately dilated. Left ventricular  diastolic parameters were normal.   2. Right ventricular systolic function is normal. The right ventricular  size is normal.   3. Left atrial size was mildly dilated.   4. Right atrial size was mildly dilated.   5. The mitral valve is normal in structure. Moderate mitral valve  regurgitation.   6. Tricuspid valve regurgitation is moderate.   7. The aortic valve is normal in structure. Aortic valve regurgitation is  trivial.   Patient Profile     81 y.o. male with a history of chronic HFP EF, stroke complicated by aphasia, hypertension, hyperlipidemia, type 2 diabetes, Parkinson's disease, hypothyroidism, recent right intertrochanter femur fracture status post ORIF complicated by acute blood loss anemia, who has been seen and evaluated for acute HFrEF.  Assessment & Plan    Acute HFrEF -Patient denies any recurrent shortness of breath but did have a chronic cough postoperatively -LVEF 30-35% on recent echocardiogram -this is a new cardiomyopathy as compared to the study in 2018 -He is continued on furosemide 40 mg IV daily -Irbesartan was increased to 75 mg daily -Continued on Farxiga 10 mg daily -Is also continued on carvedilol 6.25 mg twice daily -We will continue to escalate GDMT as tolerated -Discussion had with patient and family at this morning about causes of heart failure and ideally the gold standard testing is for left heart catheterization, also discussed other modalities of evaluation being a Myoview Lexiscan or coronary CT.  At this time the patient and family are in agreement to wait a few weeks after his medications has been escalated and see how he feels and on return visit to clinic determine which modality of testing they would prefer to determine the cause of his acute HFrEF -Daily weight, I&O, low-sodium diet -Heart failure education  Coronary artery disease -Denies  any chest pain -Recent CT of the chest reveals extensive coronary artery calcification and aortic atherosclerosis -High-sensitivity troponin of 39 and 39 -EKG unrevealing for ischemic concerns -Continue aspirin and statin therapy  Chronic artery disease with history of stroke -Patient and family deny any new neurological changes -  Continue aspirin and statin therapy  Right hip fracture status postsurgical fixation -Continue with therapy -Continue with rehabilitation per IM  Type 2 diabetes -Hemoglobin A1c of 5.7 -Continue insulin and schedule CBGs -Regimen per IM     For questions or updates, please contact [place] Please consult [email or web address] for contact info under        Signed, Jayzon Taras, NP  01/13/2022, 4:41 PM

## 2022-01-13 NOTE — Progress Notes (Signed)
Progress Note   Patient: Gilbert [name] YTK:[id number] DOB: 11/24/1940 DOA: 01/10/2022     3 DOS: the patient was seen and examined on 01/13/2022   Brief hospital course: Gilbert [name] was admitted to the hospital with the working diagnosis of decompensated heart failure.   81 yo male with the past medical history of Parkinson's disease, T2DM, hypothyroidism, recent right hip intertrochanteric fracture sp ORIF and heart failure who presented with dyspnea. Patient was transferred from SNF due to respiratory distress and cough. Apparently patient had a chocking event at the rehab, triggering his respiratory symptoms.  On his initial physical examination blood pressure 134/64, HR 64, RR 20. 02 saturation 94%, lungs with decreased breath sounds, positive rhonchi and rales, no wheezing, heart with S1 and S2 present and rhythmic, abdomen with no distention, positive lower extremity edema.   Na 131, K 5,5 CL 100 bicarbonate 10 glucose 153, bun 187 cr 1,0 BNP 1,827  High sensitive troponin 39  Wbc 9,6 hgb 9,7 plt 268  Sars covid 19 negative   Chest radiograph with mild cardiomegaly, positive bilateral interstitial infiltrates with cephalization of the vasculature.  CT chest with bilateral ground glass opacities, bilateral pleural effusions and bilateral atelectasis. More dense at the right lower lobe with air bronchogram. Multiple healing bilateral rib fractures.  CT abdomen moderate pleural effusions. With dependent atelectasis. Bladder with boiled appearance.   Patient was placed on diuretic therapy and antibiotics.        Assessment and Plan: Acute on chronic systolic CHF (congestive heart failure) (HCC) Echocardiogram showed reduced EF of 30-35% with WMA's and moderate MR, moderate TR.  Volume status has improved with diuresis Net IO Since Admission: -4,520 mL [01/12/22 1834] --Continue IV Lasix 40 mg daily --Cardiology consulted given new finding of reduced EF, may require  ischemic evaluation given WMA's -- after discussion with pt and family, this will be deferred after initial medical management and repeat Echo in coming months --On Coreg 6.25 mg BID, and irbesartan 75 mg daily --Daily weights & strict I/O's --Low sodium diet --Monitor renal function, electrolytes  Aspiration pneumonia (HCC) Right lower lobe infiltrate on chest CT Initially on IV antibiotics --Continue Augmentin x 5 days --Continue oxymetry monitoring  --SLP for swallow evaluation  CKD (chronic kidney disease), stage IIIa Renal function near baseline  HTN (hypertension) Continue blood pressure control with amlodipine, atenolol, and irbarsartan.   Dyslipidemia Continue with atorvastatin.   Goals of care, counseling/discussion My colleague, Gilbert [name] discussed with patient wife and daughter Code status. Explained probability of successful resuscitation, explained sequellae of success - ICU care, high mortality, etc. Referred to [email or web address] --Full code for now.   Hypothyroidism Continue levothyroxine  Weight loss, abnormal Patient with weight loss > 25 lbs. Gilbert [name], PCP in August wanted CT abd/pelvis to r/o occult malignancy. Patient reports loss of taste, smell and appetite since testing positive for Covid about 1 year ago. He is taking ensure  Plan glucerna tid  CT abd/pelvis w w/o contrast  Parkinson disease ([place]) Continue with rivastigmine, divalproex Continue with sinemet.   Type II diabetes mellitus with renal manifestations (HCC) Hyperglycemia  Continue insulin sliding scale.   Continue with SGLT 2 inh.         Subjective: Patient denies acute complaints when seen today.  Family were at bedside.  Pt in good spirits, joking with me and using sarcasm.  States he feels fine and eager to get out of the hospital.  Family note some confusion.  No  acute events reported.   Physical Exam: Vitals:   01/13/22 0816 01/13/22 1150 01/13/22 1210 01/13/22  1650  BP: 129/87 132/70 132/61 (!) 112/50  Pulse: 81 78 79 72  Resp: (!) 22 18 16 20   Temp: 98.2 F (36.8 C) 98.4 F (36.9 C) 98.2 F (36.8 C)   TempSrc: Oral Oral Oral   SpO2: 95%  95% 98%  Weight:      Height:       General exam: awake, alert, no acute distress, in good spirits HEENT: moist mucus membranes, hearing grossly normal  Respiratory system: CTAB, no wheezes, rales or rhonchi, normal respiratory effort. Cardiovascular system: normal S1/S2, RRR, no pedal edema.   Central nervous system: A&O x3. no gross focal neurologic deficits, normal speech Extremities: moves all, no edema, normal tone Skin: dry, intact, normal temperature Psychiatry: normal mood, congruent affect, judgement and insight appear normal    Data Reviewed: Notable labs --- K 3.4, glucose 164, Ca 8.1, trop 39 (unchanged)  Echo with LVEF 30-35% with regional WMA's, normal diastolic parameters, moderate MR, moderate TR   Family Communication: family members at bedside on rounds   Disposition: Status is: Inpatient Remains inpatient appropriate because: heart failure and aspiration pneumonia    Planned Discharge Destination: Home        Author: [name], DO 01/13/2022 4:54 PM  For on call review [email or web address].

## 2022-01-13 NOTE — Care Management Important Message (Signed)
Important Message  Patient Details  Name: Gilbert GABRIELSON Sr. MRN: [id number] Date of Birth: 06-20-1940   Medicare Important Message Given:  Yes     [name] 01/13/2022, 11:26 AM

## 2022-01-14 DIAGNOSIS — R197 Diarrhea, unspecified: Secondary | ICD-10-CM | POA: Clinically undetermined

## 2022-01-14 DIAGNOSIS — I5033 Acute on chronic diastolic (congestive) heart failure: Secondary | ICD-10-CM | POA: Diagnosis not present

## 2022-01-14 DIAGNOSIS — G20 Parkinson's disease: Secondary | ICD-10-CM

## 2022-01-14 DIAGNOSIS — I5023 Acute on chronic systolic (congestive) heart failure: Secondary | ICD-10-CM

## 2022-01-14 LAB — BASIC METABOLIC PANEL
Anion gap: 11 (ref 5–15)
BUN: 19 mg/dL (ref 8–23)
CO2: 29 mmol/L (ref 22–32)
Calcium: 8.2 mg/dL — ABNORMAL LOW (ref 8.9–10.3)
Chloride: 97 mmol/L — ABNORMAL LOW (ref 98–111)
Creatinine, Ser: 1.28 mg/dL — ABNORMAL HIGH (ref 0.61–1.24)
GFR, Estimated: 56 mL/min — ABNORMAL LOW (ref >60)
Glucose, Bld: 237 mg/dL — ABNORMAL HIGH (ref 70–99)
Potassium: 3.3 mmol/L — ABNORMAL LOW (ref 3.5–5.1)
Sodium: 137 mmol/L (ref 135–145)

## 2022-01-14 LAB — GLUCOSE, CAPILLARY
Glucose-Capillary: 165 mg/dL — ABNORMAL HIGH (ref 70–99)
Glucose-Capillary: 207 mg/dL — ABNORMAL HIGH (ref 70–99)
Glucose-Capillary: 192 mg/dL — ABNORMAL HIGH (ref 70–99)
Glucose-Capillary: 141 mg/dL — ABNORMAL HIGH (ref 70–99)

## 2022-01-14 MED ORDER — POTASSIUM CHLORIDE CRYS ER 20 MEQ PO TBCR: 40.0000 meq | EXTENDED_RELEASE_TABLET | Freq: Once | ORAL | Status: DC

## 2022-01-14 MED ORDER — POTASSIUM CHLORIDE CRYS ER 20 MEQ PO TBCR
40.0000 meq | EXTENDED_RELEASE_TABLET | ORAL | Status: AC
  Administered 2022-01-14 (×2): 40 meq via ORAL
  Filled 2022-01-14 (×2): qty 2

## 2022-01-14 MED ORDER — FUROSEMIDE 40 MG PO TABS
40.0000 mg | ORAL_TABLET | Freq: Every day | ORAL | Status: DC
  Administered 2022-01-15: 40 mg via ORAL
  Filled 2022-01-14: qty 1

## 2022-01-14 NOTE — Assessment & Plan Note (Addendum)
Incontinent with diarrhea yesterday and today. Stop scheduled Miralax and Colace. No fevers or signs of infection. --Resume stool softeners AS NEEDED.

## 2022-01-14 NOTE — Assessment & Plan Note (Addendum)
K improved to 4.0 today after replacement.  --BMP repeat in 1 week

## 2022-01-14 NOTE — TOC Progression Note (Signed)
Transition of Care (TOC) - Progression Note    Patient Details  Name: Gilbert ALDRETE Sr. MRN: [id number] Date of Birth: [birth year]  Transition of Care [facility]) CM/SW [place], [place] Phone Number: 01/14/2022, 9:34 AM  Clinical Narrative:     Ambulance approved : [phone] SNF : due to dementia patient will not be able to participate meaningfully in snf and appears more custodial at this time. Patient just spent 20 days in rehab and failure to thrive they would like to do a peer to peer.   Dr. Lynder Parents [phone] To be completed by 5pm today.   MD updated on above.   Expected Discharge Plan: [place] Barriers to Discharge: Continued Medical Work up  Expected Discharge Plan and Services Expected Discharge Plan: [place] arrangements for the past 2 months: Single Family Home Expected Discharge Date: 01/15/22                                     Social Determinants of Health (SDOH) Interventions    Readmission Risk Interventions     No data to display

## 2022-01-14 NOTE — Progress Notes (Addendum)
Progress Note   Patient: Gilbert [name] BWL:[id number] DOB: 1941/03/22 DOA: 01/10/2022     4 DOS: the patient was seen and examined on 01/14/2022   Brief hospital course: Gilbert [name] was admitted to the hospital with the working diagnosis of decompensated heart failure.   81 yo male with the past medical history of Parkinson's disease, T2DM, hypothyroidism, recent right hip intertrochanteric fracture sp ORIF and heart failure who presented with dyspnea. Patient was transferred from SNF due to respiratory distress and cough. Apparently patient had a chocking event at the rehab, triggering his respiratory symptoms.  On his initial physical examination blood pressure 134/64, HR 64, RR 20. 02 saturation 94%, lungs with decreased breath sounds, positive rhonchi and rales, no wheezing, heart with S1 and S2 present and rhythmic, abdomen with no distention, positive lower extremity edema.   Na 131, K 5,5 CL 100 bicarbonate 10 glucose 153, bun 187 cr 1,0 BNP 1,827  High sensitive troponin 39  Wbc 9,6 hgb 9,7 plt 268  Sars covid 19 negative   Chest radiograph with mild cardiomegaly, positive bilateral interstitial infiltrates with cephalization of the vasculature.  CT chest with bilateral ground glass opacities, bilateral pleural effusions and bilateral atelectasis. More dense at the right lower lobe with air bronchogram. Multiple healing bilateral rib fractures.  CT abdomen moderate pleural effusions. With dependent atelectasis. Bladder with boiled appearance.   Patient was placed on diuretic therapy and antibiotics.        Assessment and Plan: Acute HFrEF (heart failure with reduced ejection fraction) (HCC) Echocardiogram showed reduced EF of 30-35% with WMA's and moderate MR, moderate TR.  Volume status has improved with diuresis Net IO Since Admission: -4,520 mL [01/12/22 1834] --Treated with IV Lasix 40 mg daily --Expect transition to p.o. diuretic today or  tomorrow --Cardiology consulted given new finding of reduced EF, may require ischemic evaluation given WMA's -- after discussion with pt and family, this will be deferred after initial medical management and repeat Echo in coming months --On Coreg 6.25 mg BID, and irbesartan 75 mg daily --Daily weights & strict I/O's --Low sodium diet --Monitor renal function, electrolytes  Aspiration pneumonia (Gilbert [name]) Right lower lobe infiltrate on chest CT Initially on IV antibiotics --Continue Augmentin x 5 days --Continue oxymetry monitoring  --SLP for swallow evaluation  Hypokalemia K 3.3 today, from 3.4 yesterday despite replacement.  Remains on IV Lasix. --Replace PO K-Cl 40 mEq x 2 today --BMP tomorrow  CKD (chronic kidney disease), stage IIIa Renal function near baseline  HTN (hypertension) Continue blood pressure control with amlodipine, atenolol, and irbarsartan.   Dyslipidemia Continue with atorvastatin.   Goals of care, counseling/discussion My colleague, Gilbert [name] discussed with patient wife and daughter Code status. Explained probability of successful resuscitation, explained sequellae of success - ICU care, high mortality, etc. Referred to [email or web address] --Full code for now.   Hypothyroidism Continue levothyroxine  Weight loss, abnormal Patient with weight loss > 25 lbs. Gilbert [name], PCP in August wanted CT abd/pelvis to r/o occult malignancy. Patient reports loss of taste, smell and appetite since testing positive for Covid about 1 year ago. He is taking ensure  Plan glucerna tid  CT abd/pelvis w w/o contrast  Parkinson disease (Gilbert [name]) Continue with rivastigmine, divalproex Continue with sinemet.   Type II diabetes mellitus with renal manifestations (HCC) Hyperglycemia  Continue insulin sliding scale.   Continue with SGLT 2 inh.   Diarrhea Incontinent with diarrhea yesterday and today. Stop Miralax and Colace for now. No indication to  test for c diff or  other infection at this time.  Monitor.  Generalized weakness Patient admitted from rehab where he had been discharged after fall sustaining hip fracture.  He had progressed to walking about 50 to 60 feet.   -- PT and OT are recommending SNF/rehab -- TOC following -- Did peer-to-peer call with insurance medical director this afternoon who stated would proceed with approval --Fall precautions        Subjective: Patient seated edge of bed working with PT when seen on rounds.  Patient denies any acute complaints today, says he is feeling okay.  Denies hip pain.  Says he is eager to get up walking around like he was at rehab.   Physical Exam: Vitals:   01/14/22 0500 01/14/22 0508 01/14/22 0848 01/14/22 1233  BP:  129/65 (!) 140/63 115/64  Pulse:  71 84 71  Resp:  18 18 18   Temp:  98.8 F (37.1 C) 98.9 F (37.2 C) (!) 97.5 F (36.4 C)  TempSrc:  Oral Oral Oral  SpO2:  92% 94% 96%  Weight: 70.5 kg     Height:       General exam: awake, alert, no acute distress, in good spirits HEENT: moist mucus membranes, hearing grossly normal  Respiratory system: CTAB, no wheezes, rales or rhonchi, normal respiratory effort. Cardiovascular system: normal S1/S2, RRR, no pedal edema.   Central nervous system: A&O x3. no gross focal neurologic deficits, normal speech Extremities: No peripheral edema, normal tone Skin: dry, intact, normal temperature Psychiatry: normal mood, congruent affect, judgement and insight appear normal    Data Reviewed: Notable labs --- K 3.3, chloride 97, glucose 237, creatinine 1.28 up from 1.15  Echo with LVEF 30-35% with regional WMA's, normal diastolic parameters, moderate MR, moderate TR   Family Communication: family members at bedside on rounds   Disposition: Status is: Inpatient Remains inpatient appropriate because: heart failure and aspiration pneumonia    Planned Discharge Destination: Home        Author: [name], DO 01/14/2022  3:00 PM  For on call review [email or web address].

## 2022-01-14 NOTE — Progress Notes (Signed)
Physical Therapy Treatment Patient Details Name: Gilbert [name] Sr. MRN: [id number] DOB: 10/24/1940 Today's Date: 01/14/2022   History of Present Illness Mr. Elsen, an 81 y/o with Parkinson's, s/p CVA with aphasia, DM, Hypothyroidism, chronic HFpEF, recent ORIF right intratrochanteric fx with blood loss anemia was noted by SNF staff to have cough today. With concern for respiratory issues and suspected pneumonia by medical staff at his care facility, patient transported to [facility] for further evaluation. Patient is currently on antibiotics for UTI.    PT Comments    Therapist in to see pt this am, received in bed, family at bedside. Pt expressed motivation to get out of bed and increase mobility. Pt required ModA to transfer supine to edge of bed with railing. Sitting balance improved with good upright posture maintained. Pt completed several sit<>stand transfers to RW for hygiene assist. Good demonstration of upright standing for 1 minute increments. Pt able to advance 3 ft to bedside chair with RW and CGA. Pt demonstrated ability to tolerate more activity this date, no c/o hip pain limiting movement as previously present. Pt has supportive family, ramp to enter, w/c, recliner lift chair, and RW at home. Currently he is able to tolerate more activity and progress with mobility. However he would benefit from short stay at SNF to increase strength, standing balance, and reduce fall risk prior to returning home with family assist.     Recommendations for follow up therapy are one component of a multi-disciplinary discharge planning process, led by the attending physician.  Recommendations may be updated based on patient status, additional functional criteria and insurance authorization.  Follow Up Recommendations  Skilled nursing-short term rehab (<3 hours/day) Can patient physically be transported by private vehicle: No   Assistance Recommended at Discharge Frequent or constant  Supervision/Assistance  Patient can return home with the following Two people to help with walking and/or transfers;A lot of help with bathing/dressing/bathroom;Assistance with cooking/housework;Direct supervision/assist for medications management;Direct supervision/assist for financial management;Assist for transportation;Help with stairs or ramp for entrance   Equipment Recommendations  None recommended by PT    Recommendations for Other Services       Precautions / Restrictions Precautions Precautions: Fall Restrictions Weight Bearing Restrictions: No RLE Weight Bearing: Weight bearing as tolerated     Mobility  Bed Mobility Overal bed mobility: Needs Assistance Bed Mobility: Supine to Sit, Sit to Supine     Supine to sit: Mod assist, HOB elevated Sit to supine: Supervision   General bed mobility comments: Increased time/effort to perfom sup>sit. Requires MOD A to for trunk elevation.    Transfers Overall transfer level: Needs assistance Equipment used: Rolling walker (2 wheels) Transfers: Sit to/from Stand, Bed to chair/wheelchair/BSC Sit to Stand: Min assist, Mod assist          Lateral/Scoot Transfers: Mod assist General transfer comment: Improved from previous session    Ambulation/Gait Ambulation/Gait assistance: Min assist Gait Distance (Feet): 3 Feet Assistive device: Rolling walker (2 wheels) Gait Pattern/deviations: Step-to pattern, Decreased step length - right, Decreased step length - left, Decreased weight shift to right, Trunk flexed           Stairs Stairs:  (Pt has a ramp at home)           Wheelchair Mobility    Modified Rankin (Stroke Patients Only)       Balance Overall balance assessment: Needs assistance, History of Falls Sitting-balance support: Feet supported, Single extremity supported Sitting balance-Leahy Scale: Good Sitting balance - Comments: Much  improved sitting balance today, no leaning   Standing balance  support: Bilateral upper extremity supported, During functional activity, Reliant on assistive device for balance Standing balance-Leahy Scale: Fair Standing balance comment: Improved ability to maintain static standing with RW support                            Cognition Arousal/Alertness: Awake/alert Behavior During Therapy: WFL for tasks assessed/performed, Flat affect Overall Cognitive Status: Within Functional Limits for tasks assessed                                 General Comments: Pt alert and oriented, however delayed response and gives false information (joking?) that he later resends with encouragement from family at bedside        Exercises Total Joint Exercises Ankle Circles/Pumps: AROM, 10 reps, Both Long Arc Quad: Strengthening, Both, 10 reps Knee Flexion: Strengthening, 10 reps, Both    General Comments        Pertinent Vitals/Pain Pain Assessment Pain Assessment: 0-10 Pain Score: 4  Pain Location:  (lower back, buttocks, R LE) Pain Descriptors / Indicators: Grimacing, Aching, Discomfort Pain Intervention(s): Monitored during session (applied barrier cream to buttocks)    Home Living                          Prior Function            PT Goals (current goals can now be found in the care plan section) Acute Rehab PT Goals Patient Stated Goal: to go to [facility] Progress towards PT goals: Progressing toward goals    Frequency    Min 2X/week      PT Plan Current plan remains appropriate    Co-evaluation              AM-PAC PT "6 Clicks" Mobility   Outcome Measure  Help needed turning from your back to your side while in a flat bed without using bedrails?: A Little Help needed moving from lying on your back to sitting on the side of a flat bed without using bedrails?: A Lot Help needed moving to and from a bed to a chair (including a wheelchair)?: A Lot Help needed standing up from a chair using  your arms (e.g., wheelchair or bedside chair)?: A Lot Help needed to walk in hospital room?: Total Help needed climbing 3-5 steps with a railing? : Total 6 Click Score: 11    End of Session Equipment Utilized During Treatment: Gait belt Activity Tolerance: Patient tolerated treatment well Patient left: in chair;with call bell/phone within reach;with chair alarm set;with family/visitor present Nurse Communication: Mobility status PT Visit Diagnosis: Unsteadiness on feet (R26.81);Other abnormalities of gait and mobility (R26.89);Muscle weakness (generalized) (M62.81);Difficulty in walking, not elsewhere classified (R26.2);History of falling (Z91.81);Pain Pain - Right/Left: Right Pain - part of body: Leg     Time: 3825-0539 PT Time Calculation (min) (ACUTE ONLY): 45 min  Charges:  $Gait Training: 8-22 mins $Therapeutic Exercise: 8-22 mins $Therapeutic Activity: 8-22 mins          Mikel Cella, PTA    Josie Dixon 01/14/2022, 11:26 AM

## 2022-01-14 NOTE — Assessment & Plan Note (Addendum)
Patient admitted from rehab where he had been discharged after fall sustaining hip fracture.  He had progressed to walking about 50 to 60 feet.   -- PT and OT are recommending SNF/rehab --Fall precautions

## 2022-01-14 NOTE — Progress Notes (Signed)
Cardiology Progress Note   Patient Name: Gilbert NETO Sr. Date of Encounter: 01/14/2022  Primary Cardiologist: Nelva Bush, MD  Subjective   Feels relatively well.  Breathing much improved.  Had diarrhea this AM.  No chest pain.  Inpatient Medications    Scheduled Meds:  acetaminophen  1,000 mg Oral Q8H   amoxicillin-clavulanate  1 tablet Oral Q12H   aspirin EC  81 mg Oral BID   atorvastatin  40 mg Oral Daily   carbidopa-levodopa  2 tablet Oral QID   carvedilol  6.25 mg Oral BID WC   dapagliflozin propanediol  10 mg Oral Daily   divalproex  500 mg Oral BID   docusate sodium  100 mg Oral BID   enoxaparin (LOVENOX) injection  40 mg Subcutaneous Q24H   feeding supplement (GLUCERNA SHAKE)  237 mL Oral TID BM   furosemide  40 mg Intravenous Daily   insulin aspart  0-15 Units Subcutaneous TID WC   irbesartan  75 mg Oral Daily   levothyroxine  75 mcg Oral QAC breakfast   megestrol  20 mg Oral Daily   multivitamin with minerals  1 tablet Oral Daily   polyethylene glycol  17 g Oral Daily   rivastigmine  1.5 mg Oral BID   Continuous Infusions:  PRN Meds: acetaminophen, colchicine, guaiFENesin-dextromethorphan, methocarbamol, ondansetron (ZOFRAN) IV, mouth rinse   Vital Signs    Vitals:   01/13/22 1650 01/14/22 0500 01/14/22 0508 01/14/22 0848  BP: (!) 112/50  129/65 (!) 140/63  Pulse: 72  71 84  Resp: 20  18 18   Temp: 98.4 F (36.9 C)  98.8 F (37.1 C) 98.9 F (37.2 C)  TempSrc: Oral  Oral Oral  SpO2: 98%  92% 94%  Weight:  70.5 kg    Height:        Intake/Output Summary (Last 24 hours) at 01/14/2022 1156 Last data filed at 01/14/2022 0404 Gross per 24 hour  Intake 240 ml  Output 1000 ml  Net -760 ml   Filed Weights   01/11/22 0500 01/13/22 0500 01/14/22 0500  Weight: 75 kg 71.4 kg 70.5 kg    Physical Exam   GEN: frail, in no acute distress.  HEENT: Grossly normal.  Neck: Supple, no JVD, carotid bruits, or masses. Cardiac: RRR, no murmurs,  rubs, or gallops. No clubbing, cyanosis, edema.  Radials 2+, DP/PT 1+ and equal bilaterally.  Respiratory:  Respirations regular and unlabored, scattered rhonchi. GI: Soft, nontender, nondistended, BS + x 4. MS: no deformity or atrophy. Skin: warm and dry, no rash. Neuro:  Strength and sensation are intact. Psych: AAOx3.  Normal affect.  Labs    Chemistry Recent Labs  Lab 01/10/22 [phone] 01/11/22 0412 01/12/22 0637 01/13/22 0449  NA 131* 134* 139 139  K 5.5* 2.9* 3.0* 3.4*  CL 100 98 100 103  CO2 20* 26 28 26   GLUCOSE 153* 210* 157* 164*  BUN 17 16 14 16   CREATININE 1.01 1.10 1.24 1.15  CALCIUM 8.0* 7.8* 7.9* 8.1*  PROT 6.2*  --   --   --   ALBUMIN 2.7*  --   --   --   AST 39  --   --   --   ALT <5  --   --   --   ALKPHOS 109  --   --   --   BILITOT 1.4*  --   --   --   GFRNONAA >60 >60 58* >60  ANIONGAP 11 10 11  10  Hematology Recent Labs  Lab 01/10/22 0821  WBC 9.6  RBC 3.18*  HGB 9.7*  HCT 31.0*  MCV 97.5  MCH 30.5  MCHC 31.3  RDW 16.6*  PLT 268    Cardiac Enzymes  Recent Labs  Lab 01/10/22 0821 01/13/22 0449  TROPONINIHS 39* 39*      BNP    Component Value Date/Time   BNP 1,827.4 (H) 01/10/2022 0821    Lipids  Lab Results  Component Value Date   CHOL 105 04/21/2021   HDL 34 (L) 04/21/2021   LDLCALC 37 04/21/2021   TRIG 168 (H) 04/21/2021   CHOLHDL 3.1 04/21/2021    HbA1c  Lab Results  Component Value Date   HGBA1C 5.4 01/10/2022    Radiology    CT ABDOMEN PELVIS WO CONTRAST  Result Date: 01/10/2022 CLINICAL DATA:  Unintentional weight loss. EXAM: CT ABDOMEN AND PELVIS WITHOUT CONTRAST TECHNIQUE: Multidetector CT imaging of the abdomen and pelvis was performed following the standard protocol without IV contrast. RADIATION DOSE REDUCTION: This exam was performed according to the departmental dose-optimization program which includes automated exposure control, adjustment of the mA and/or kV according to patient size and/or use of  iterative reconstruction technique. COMPARISON:  03/14/2019 FINDINGS: Lower chest: Moderate effusions. Dependent lung base opacity consistent with atelectasis. Bilateral hilar calcifications. Small calcified lung base granuloma. Three-vessel coronary artery calcifications. Hepatobiliary: No focal liver abnormality is seen. Status post cholecystectomy. No biliary dilatation. Pancreas: Pancreatic atrophy.  No mass.  No inflammation. Spleen: Splenic calcifications consistent with healed granuloma. Spleen normal in size. No mass. Adrenals/Urinary Tract: No adrenal masses. No renal masses, stones or hydronephrosis. Normal ureters. Bilobed appearance of the bladder. Anterior superior aspect shows wall thickening and hazy inflammatory change in the adjacent fat. No bladder mass or stone. Stomach/Bowel: Stomach is unremarkable. Small bowel and colon are normal in caliber. No wall thickening. No inflammation. Normal appendix visualized. Vascular/Lymphatic: Dense aortic atherosclerotic calcifications extending to the branch vessels. No aneurysm. No enlarged lymph nodes. Reproductive: Unremarkable. Other: No abdominal wall hernia or ascites. Subcutaneous soft tissue edema of the right hemipelvis and visualized upper right thigh. ORIF of an intertrochanteric right femur fracture, incompletely imaged. This was performed on 12/15/2021. Musculoskeletal: Mild compression deformities of L1 and L2, unchanged. Schmorl's node along the upper endplate of L4, also stable. No acute fractures. No bone lesions. Old left rib fractures. IMPRESSION: 1. Moderate pleural effusions with associated dependent atelectasis. No convincing pneumonia. No evidence of pulmonary edema. Inferior hilar calcifications and calcified granuloma consistent with healed granulomatous disease, also evident by splenic calcifications. 2. Bladder has a bilobed appearance, which it did on the previous CT. The upper portion of the bladder shows wall thickening and  adjacent inflammatory type haziness. Findings suspicious for cystitis. No bladder mass. 3. No other evidence of an acute abnormality within the abdomen or pelvis. No evidence of malignancy. 4. Dense aortic atherosclerosis. 5. Changes from recent right proximal femur fracture ORIF. Electronically Signed   By: Lajean Manes M.D.   On: 01/10/2022 17:10    Telemetry    RSR, PACs/PVCs - Personally Reviewed  Cardiac Studies   2D Echocardiogram 9.18.2023  1. Left ventricular ejection fraction, by estimation, is 30 to 35%. The  left ventricle has moderately decreased function. The left ventricle  demonstrates regional wall motion abnormalities (see scoring  diagram/findings for description). The left  ventricular internal cavity size was moderately dilated. Left ventricular  diastolic parameters were normal.   2. Right ventricular systolic function is normal.  The right ventricular  size is normal.   3. Left atrial size was mildly dilated.   4. Right atrial size was mildly dilated.   5. The mitral valve is normal in structure. Moderate mitral valve  regurgitation.   6. Tricuspid valve regurgitation is moderate.   7. The aortic valve is normal in structure. Aortic valve regurgitation is  trivial.   Patient Profile     81 y.o. male with a history of chronic HFP EF, stroke complicated by aphasia, hypertension, hyperlipidemia, type 2 diabetes, Parkinson's disease, hypothyroidism, recent right intertrochanter femur fracture status post ORIF complicated by acute blood loss anemia, who has been seen and evaluated for acute HFrEF.  Assessment & Plan    1.  Acute HFrEF/Cardiomyopathy:  Admitted 9/17 w/ cough, dyspnea, and found to have new LV dysfxn w/ EF of 30-35%, mod MR/TR.  He has responded well to IV diuresis.  Minus 1.86L overnight, and minus 8.2 L since admission.  Wt down another 0.9 kg to 70.5 (75 kg on admission) - lowest wt on record. Renal fxn stable yesterday - f/u today. On exam,  somewhat frail. Overall appears euvolemic w/o JVD/edema.  Scattered rhonchi.  Cont ? blocker, ARB, SGLT2i.  F/u bmet and will consider transitioning to oral lasix today.  2.  Supply-Demand Ischemia/Coroncary Ca2+ on CT:  HsTrop mildly elevated w/ flat trend - 39 39.  Echo w/ EF 30-35%, mod MR/TR.  Denies c/p.  Cont asa, statin, ? blocker, ARB.  Will cont to defer ischemic eval to outpt setting.  As prev noted, poor CTA candidate 2/2 freq ectopy on tele, limited utility of stress testing w/ abnl EF and wall motion abnormality.  Cath likely best option.  3.  Essential HTN:  Stable on ? blockerd an ARB.  4.  HL:  LDL 37 on statin rx.  5.  DMII:  A1c 5.7.  SSI per IM.  6.  R Hip Fx s/p surgical fixation:  Per PT/IM.  7.  Hypokalemia:  3.4 on 9/20 - was supplemented.  F/u this AM.  8.  H/o CVA:  asa/statin.  Signed, Murray Hodgkins, NP  01/14/2022, 11:56 AM    For questions or updates, please contact   Please consult [email or web address] for contact info under Cardiology/STEMI.

## 2022-01-15 DIAGNOSIS — I420 Dilated cardiomyopathy: Secondary | ICD-10-CM

## 2022-01-15 DIAGNOSIS — J189 Pneumonia, unspecified organism: Secondary | ICD-10-CM

## 2022-01-15 DIAGNOSIS — R634 Abnormal weight loss: Secondary | ICD-10-CM

## 2022-01-15 DIAGNOSIS — I5033 Acute on chronic diastolic (congestive) heart failure: Secondary | ICD-10-CM | POA: Diagnosis not present

## 2022-01-15 DIAGNOSIS — N1831 Chronic kidney disease, stage 3a: Secondary | ICD-10-CM

## 2022-01-15 LAB — BASIC METABOLIC PANEL
Anion gap: 9 (ref 5–15)
BUN: 22 mg/dL (ref 8–23)
CO2: 26 mmol/L (ref 22–32)
Calcium: 8.3 mg/dL — ABNORMAL LOW (ref 8.9–10.3)
Chloride: 105 mmol/L (ref 98–111)
Creatinine, Ser: 1.18 mg/dL (ref 0.61–1.24)
GFR, Estimated: >60 mL/min (ref >60)
Glucose, Bld: 177 mg/dL — ABNORMAL HIGH (ref 70–99)
Potassium: 4 mmol/L (ref 3.5–5.1)
Sodium: 140 mmol/L (ref 135–145)

## 2022-01-15 LAB — GLUCOSE, CAPILLARY
Glucose-Capillary: 197 mg/dL — ABNORMAL HIGH (ref 70–99)
Glucose-Capillary: 190 mg/dL — ABNORMAL HIGH (ref 70–99)
Glucose-Capillary: 219 mg/dL — ABNORMAL HIGH (ref 70–99)

## 2022-01-15 LAB — MAGNESIUM: Magnesium: 1.9 mg/dL (ref 1.7–2.4)

## 2022-01-15 MED ORDER — SACUBITRIL-VALSARTAN 49-51 MG PO TABS: 1.0000 | ORAL_TABLET | Freq: Two times a day (BID) | ORAL | Status: DC

## 2022-01-15 MED ORDER — POLYETHYLENE GLYCOL 3350 17 G PO PACK: 17.0000 g | PACK | Freq: Every day | ORAL | 0 refills | Status: AC | PRN

## 2022-01-15 MED ORDER — SACUBITRIL-VALSARTAN 24-26 MG PO TABS: 1.0000 | ORAL_TABLET | Freq: Two times a day (BID) | ORAL | Status: DC

## 2022-01-15 MED ORDER — FUROSEMIDE 40 MG PO TABS: 40.0000 mg | ORAL_TABLET | Freq: Every day | ORAL | Status: AC

## 2022-01-15 MED ORDER — SACUBITRIL-VALSARTAN 24-26 MG PO TABS: 1.0000 | ORAL_TABLET | Freq: Two times a day (BID) | ORAL | Status: AC

## 2022-01-15 MED ORDER — ORAL CARE MOUTH RINSE: 15.0000 mL | OROMUCOSAL | Status: DC | PRN

## 2022-01-15 MED ORDER — ORAL CARE MOUTH RINSE: 15.0000 mL | OROMUCOSAL | Status: DC

## 2022-01-15 MED ORDER — ORAL CARE MOUTH RINSE
15.0000 mL | OROMUCOSAL | Status: DC
  Administered 2022-01-15: 15 mL via OROMUCOSAL

## 2022-01-15 MED ORDER — DAPAGLIFLOZIN PROPANEDIOL 10 MG PO TABS: 10.0000 mg | ORAL_TABLET | Freq: Every day | ORAL | Status: AC

## 2022-01-15 MED ORDER — INSULIN ASPART PROT & ASPART (70-30 MIX) 100 UNIT/ML ~~LOC~~ SUSP: 6.0000 [IU] | Freq: Two times a day (BID) | SUBCUTANEOUS | 1 refills | Status: AC

## 2022-01-15 MED ORDER — ACETAMINOPHEN 500 MG PO TABS: 1000.0000 mg | ORAL_TABLET | Freq: Four times a day (QID) | ORAL | 0 refills | Status: AC | PRN

## 2022-01-15 MED ORDER — DOCUSATE SODIUM 100 MG PO CAPS: 100.0000 mg | ORAL_CAPSULE | Freq: Two times a day (BID) | ORAL | 0 refills | Status: AC | PRN

## 2022-01-15 MED ORDER — CARVEDILOL 3.125 MG PO TABS: 3.1250 mg | ORAL_TABLET | Freq: Two times a day (BID) | ORAL | Status: AC

## 2022-01-15 NOTE — Discharge Summary (Signed)
Physician Discharge Summary   Patient: Gilbert CREAMER Sr. MRN: [id number] DOB: 1941/01/19  Admit date:     01/10/2022  Discharge date: 01/15/22  Discharge Physician: [name]   PCP: [name], MD   Recommendations at discharge:    Follow up with Cardiology as scheduled.   Follow up with Primary Care in 1-2 weeks Repeat BMP, Mg, CBC in 1-2 weeks Monitor BP with medication changes -- bring list of BP readings to follow up appointments Monitor blood sugars - we reduced insulin slightly to avoid hypoglycemia.  Insulin dose might need to be adjusted in near future.   Continue daily intensive physical therapy to work on strength and mobility   Discharge Diagnoses: Active Problems:   Acute on chronic systolic CHF (congestive heart failure) (HCC)   Aspiration pneumonia (HCC)   HTN (hypertension)   CKD (chronic kidney disease), stage IIIa   Hypokalemia   Dyslipidemia   Goals of care, counseling/discussion   Hypothyroidism   Parkinson disease (HCC)   Weight loss, abnormal   Type II diabetes mellitus with renal manifestations (HCC)   Generalized weakness   Diarrhea  Principal Problem (Resolved):   Acute on chronic diastolic heart failure [facility])  Hospital Course: Mr. Rockefeller was admitted to the hospital with the working diagnosis of decompensated heart failure.   81 yo male with the past medical history of Parkinson's disease, T2DM, hypothyroidism, recent right hip intertrochanteric fracture sp ORIF and heart failure who presented with dyspnea. Patient was transferred from SNF due to respiratory distress and cough. Apparently patient had a chocking event at the rehab, triggering his respiratory symptoms.  On his initial physical examination blood pressure 134/64, HR 64, RR 20. 02 saturation 94%, lungs with decreased breath sounds, positive rhonchi and rales, no wheezing, heart with S1 and S2 present and rhythmic, abdomen with no distention, positive lower extremity  edema.   Na 131, K 5,5 CL 100 bicarbonate 10 glucose 153, bun 187 cr 1,0 BNP 1,827  High sensitive troponin 39  Wbc 9,6 hgb 9,7 plt 268  Sars covid 19 negative   Chest radiograph with mild cardiomegaly, positive bilateral interstitial infiltrates with cephalization of the vasculature.  CT chest with bilateral ground glass opacities, bilateral pleural effusions and bilateral atelectasis. More dense at the right lower lobe with air bronchogram. Multiple healing bilateral rib fractures.  CT abdomen moderate pleural effusions. With dependent atelectasis. Bladder with boiled appearance.   Patient was placed on diuretic therapy and antibiotics.       9/22: Pt doing well, has no complaints.  Eager to return to work with PT at rehab.  Case discussed with cardiology and patient is cleared and stable for discharge today.     Assessment and Plan: Acute on chronic systolic CHF (congestive heart failure) (HCC) Echocardiogram showed reduced EF of 30-35% with WMA's and moderate MR, moderate TR.  Volume status has improved with diuresis Net IO Since Admission: -9,993 mL [01/15/22 1403]  --Cardiology consulted given new finding of reduced EF, may require ischemic evaluation given WMA's -- after discussion with pt and family, this will be deferred after initial medical management and repeat Echo in coming months  --Treated with IV Lasix 40 mg daily --Diuresis held on 9/21 due to Cr rise --Start oral Lasix 40 mg daily --Coreg 3.125 mg BID --Start Entresto tomorrow (9/23) --Wilder Glade 10 mg ddaily --Daily weights  --Low sodium diet --Monitor renal function, electrolytes closely at follow up.  Aspiration pneumonia ([place]) Right lower lobe infiltrate  on chest CT Initially treated with IV antibiotics. Finished up course with Augmentin. --Continue as needed cough medication  Hypokalemia K improved to 4.0 today after replacement.  --BMP repeat in 1 week  CKD (chronic kidney disease), stage  IIIa Renal function near baseline  HTN (hypertension) Continue blood pressure control with meds for heart failure, as outlined. BP's have been soft at times. Recommend BP check 2-3 times daily until next cardiology follow up.  Dyslipidemia Continue with atorvastatin.   Goals of care, counseling/discussion My colleague, Dr. Cathlean Sauer discussed with patient wife and daughter Code status. Explained probability of successful resuscitation, explained sequellae of success - ICU care, high mortality, etc. Referred to [email or web address] --Full code for now.   Hypothyroidism Continue levothyroxine  Weight loss, abnormal From prior admission: Patient with weight loss > 25 lbs. Dr. Sabra Heck, PCP in August wanted CT abd/pelvis to r/o occult malignancy. Patient reports loss of taste, smell and appetite since testing positive for Covid... --On Megace for appetite stimulation --Glucerna shakes TID between meals --Vitamins  Parkinson disease ([place]) Continue with rivastigmine, divalproex Continue with sinemet.   Type II diabetes mellitus with renal manifestations (HCC) Hyperglycemia  Reduce dose of 70/30 insulin slightly to 6 units BID. Covered with sliding scale Novolog and was using 10-12 units daily   Also on Farxiga for heart failure. Close PCP follow up to adjust regimen as needed.  Diarrhea Incontinent with diarrhea yesterday and today. Stop scheduled Miralax and Colace. No fevers or signs of infection. --Resume stool softeners AS NEEDED.   Generalized weakness Patient admitted from rehab where he had been discharged after fall sustaining hip fracture.  He had progressed to walking about 50 to 60 feet.   -- PT and OT are recommending SNF/rehab --Fall precautions         Consultants: Cardiology,  Procedures performed: Echocardiogram   Disposition: Skilled nursing facility  Diet recommendation:  Discharge Diet Orders (From admission, onward)     Start     Ordered    01/15/22 0000  Diet - low sodium heart healthy        01/15/22 1400           Cardiac diet DISCHARGE MEDICATION: Allergies as of 01/15/2022       Reactions   Metformin Diarrhea        Medication List     STOP taking these medications    amLODipine 5 MG tablet Commonly known as: NORVASC   atenolol 25 MG tablet Commonly known as: TENORMIN   ciprofloxacin 750 MG tablet Commonly known as: CIPRO   hydrALAZINE 100 MG tablet Commonly known as: APRESOLINE   oxyCODONE 5 MG immediate release tablet Commonly known as: Oxy IR/ROXICODONE   senna 8.6 MG Tabs tablet Commonly known as: SENOKOT       TAKE these medications    acetaminophen 500 MG tablet Commonly known as: TYLENOL Take 2 tablets (1,000 mg total) by mouth every 6 (six) hours as needed. What changed:  when to take this reasons to take this   alum & mag hydroxide-simeth 200-200-20 MG/5ML suspension Commonly known as: MAALOX/MYLANTA Take 30 mLs by mouth every 4 (four) hours as needed for indigestion.   ascorbic acid 500 MG tablet Commonly known as: VITAMIN C Take 1,000 mg by mouth daily.   aspirin EC 81 MG tablet Take 1 tablet (81 mg total) by mouth 2 (two) times daily.   atorvastatin 40 MG tablet Commonly known as: LIPITOR Take 40 mg by mouth daily.   carbidopa-levodopa  25-100 MG tablet Commonly known as: SINEMET IR Take 2 tablets by mouth 4 (four) times daily.   carvedilol 3.125 MG tablet Commonly known as: COREG Take 1 tablet (3.125 mg total) by mouth 2 (two) times daily with a meal.   colchicine 0.6 MG tablet Take 1 tablet by mouth daily as needed.   dapagliflozin propanediol 10 MG Tabs tablet Commonly known as: FARXIGA Take 1 tablet (10 mg total) by mouth daily. Start taking on: January 16, 2022   divalproex 500 MG DR tablet Commonly known as: DEPAKOTE Take 500 mg by mouth 2 (two) times daily.   docusate sodium 100 MG capsule Commonly known as: COLACE Take 1 capsule (100 mg  total) by mouth 2 (two) times daily as needed for mild constipation. What changed:  when to take this reasons to take this   feeding supplement (GLUCERNA SHAKE) Liqd Take 237 mLs by mouth 3 (three) times daily between meals.   furosemide 40 MG tablet Commonly known as: LASIX Take 1 tablet (40 mg total) by mouth daily. Start taking on: January 16, 2022   guaiFENesin 100 MG/5ML liquid Commonly known as: ROBITUSSIN Take 10 mLs by mouth every 8 (eight) hours as needed for cough or to loosen phlegm. For 5 days 12/10/21 - 12/14/21   insulin aspart protamine- aspart (70-30) 100 UNIT/ML injection Commonly known as: NOVOLOG MIX 70/30 Inject 0.06 mLs (6 Units total) into the skin 2 (two) times daily with a meal. What changed: how much to take   levothyroxine 75 MCG tablet Commonly known as: SYNTHROID Take 75 mcg by mouth daily before breakfast.   magnesium hydroxide 400 MG/5ML suspension Commonly known as: MILK OF MAGNESIA Take 30 mLs by mouth daily as needed for mild constipation or moderate constipation.   megestrol 20 MG tablet Commonly known as: MEGACE Take 20 mg by mouth daily.   methocarbamol 500 MG tablet Commonly known as: ROBAXIN Take 1 tablet (500 mg total) by mouth every 6 (six) hours as needed for muscle spasms.   multivitamin with minerals Tabs tablet Take 1 tablet by mouth daily.   polyethylene glycol 17 g packet Commonly known as: MIRALAX / GLYCOLAX Take 17 g by mouth daily as needed. Hold if loose or frequent stools What changed:  when to take this reasons to take this   rivastigmine 1.5 MG capsule Commonly known as: EXELON Take 1.5 mg by mouth 2 (two) times daily.   sacubitril-valsartan 24-26 MG Commonly known as: ENTRESTO Take 1 tablet by mouth 2 (two) times daily. Starting on 01/16/2022. Start taking on: January 16, 2022        Discharge Exam: Danley Danker Weights   01/14/22 0500 01/15/22 0500 01/15/22 0516  Weight: 70.5 kg 70.9 kg 69.1 kg    General exam: awake, alert, no acute distress HEENT: atraumatic, clear conjunctiva, anicteric sclera, moist mucus membranes, hearing grossly normal  Respiratory system: CTAB, no wheezes, rales or rhonchi, normal respiratory effort. Cardiovascular system: normal S1/S2, RRR, no pedal edema.   Gastrointestinal system: soft, NT, ND, no HSM felt, +bowel sounds. Central nervous system: no gross focal neurologic deficits, normal speech Extremities: moves all, no edema, normal tone Skin: dry, intact, normal temperature Psychiatry: normal mood, congruent affect, judgement and insight appear normal   Condition at discharge: stable  The results of significant diagnostics from this hospitalization (including imaging, microbiology, ancillary and laboratory) are listed below for reference.   Imaging Studies: ECHOCARDIOGRAM COMPLETE  Result Date: 01/12/2022    ECHOCARDIOGRAM REPORT   Patient Name:  Chrystie Nose Pennel Sr. Date of Exam: 01/11/2022 Medical Rec #:  [id number]             Height:       70.0 in Accession #:    [id number]            Weight:       165.3 lb Date of Birth:  July 01, 1940             BSA:          1.925 m Patient Age:    81 years              BP:           115/60 mmHg Patient Gender: M                     HR:           69 bpm. Exam Location:  [facility] Procedure: 2D Echo, Cardiac Doppler and Color Doppler Indications:     I50.31 CHF acute diastolic  History:         Patient has prior history of Echocardiogram examinations, most                  recent 05/06/2016. CHF; Risk Factors:Diabetes, Dyslipidemia and                  Hypertension.  Sonographer:     Rosalia Hammers Referring Phys:  Mar[place] Diagnosing Phys: Serafina Royals MD IMPRESSIONS  1. Left ventricular ejection fraction, by estimation, is 30 to 35%. The left ventricle has moderately decreased function. The left ventricle demonstrates regional wall motion abnormalities (see scoring diagram/findings for description). The left  ventricular internal cavity size was moderately dilated. Left ventricular diastolic parameters were normal.  2. Right ventricular systolic function is normal. The right ventricular size is normal.  3. Left atrial size was mildly dilated.  4. Right atrial size was mildly dilated.  5. The mitral valve is normal in structure. Moderate mitral valve regurgitation.  6. Tricuspid valve regurgitation is moderate.  7. The aortic valve is normal in structure. Aortic valve regurgitation is trivial. FINDINGS  Left Ventricle: Left ventricular ejection fraction, by estimation, is 30 to 35%. The left ventricle has moderately decreased function. The left ventricle demonstrates regional wall motion abnormalities. Severe akinesis of the left ventricular, mid-apical septal wall, anteroseptal wall, inferoseptal wall and apical segment. The left ventricular internal cavity size was moderately dilated. There is no left ventricular hypertrophy. Left ventricular diastolic parameters were normal. Right Ventricle: The right ventricular size is normal. No increase in right ventricular wall thickness. Right ventricular systolic function is normal. Left Atrium: Left atrial size was mildly dilated. Right Atrium: Right atrial size was mildly dilated. Pericardium: There is no evidence of pericardial effusion. Mitral Valve: The mitral valve is normal in structure. Moderate mitral valve regurgitation. Tricuspid Valve: The tricuspid valve is normal in structure. Tricuspid valve regurgitation is moderate. Aortic Valve: The aortic valve is normal in structure. Aortic valve regurgitation is trivial. Aortic valve mean gradient measures 2.0 mmHg. Aortic valve peak gradient measures 4.2 mmHg. Aortic valve area, by VTI measures 2.20 cm. Pulmonic Valve: The pulmonic valve was normal in structure. Pulmonic valve regurgitation is trivial. Aorta: The aortic root and ascending aorta are structurally normal, with no evidence of dilitation. IAS/Shunts: No atrial  level shunt detected by color flow Doppler.  LEFT VENTRICLE PLAX 2D LVIDd:  5.70 cm   Diastology LVIDs:         4.30 cm   LV e' medial:    3.48 cm/s LV PW:         1.40 cm   LV E/e' medial:  20.2 LV IVS:        1.20 cm   LV e' lateral:   6.76 cm/s LVOT diam:     2.30 cm   LV E/e' lateral: 10.4 LV SV:         49 LV SV Index:   26 LVOT Area:     4.15 cm  RIGHT VENTRICLE RV Basal diam:  3.50 cm RV Mid diam:    2.70 cm RV S prime:     10.60 cm/s TAPSE (M-mode): 2.4 cm LEFT ATRIUM              Index        RIGHT ATRIUM           Index LA diam:        4.10 cm  2.13 cm/m   RA Area:     14.70 cm LA Vol (A2C):   104.0 ml 54.02 ml/m  RA Volume:   38.10 ml  19.79 ml/m LA Vol (A4C):   57.3 ml  29.76 ml/m LA Biplane Vol: 77.0 ml  40.00 ml/m  AORTIC VALVE                    PULMONIC VALVE AV Area (Vmax):    2.44 cm     PR End Diast Vel: 8.29 msec AV Area (Vmean):   2.27 cm AV Area (VTI):     2.20 cm AV Vmax:           103.00 cm/s AV Vmean:          69.600 cm/s AV VTI:            0.225 m AV Peak Grad:      4.2 mmHg AV Mean Grad:      2.0 mmHg LVOT Vmax:         60.40 cm/s LVOT Vmean:        38.100 cm/s LVOT VTI:          0.119 m LVOT/AV VTI ratio: 0.53  AORTA Ao Root diam: 3.90 cm MITRAL VALVE               TRICUSPID VALVE MV Area (PHT): 3.42 cm    TR Peak grad:   36.7 mmHg MV Decel Time: 222 msec    TR Vmax:        303.00 cm/s MV E velocity: 70.20 cm/s MV A velocity: 61.30 cm/s  SHUNTS MV E/A ratio:  1.15        Systemic VTI:  0.12 m                            Systemic Diam: 2.30 cm Serafina Royals MD Electronically signed by Serafina Royals MD Signature Date/Time: 01/12/2022/12:36:20 PM    Final    CT ABDOMEN PELVIS WO CONTRAST  Result Date: 01/10/2022 CLINICAL DATA:  Unintentional weight loss. EXAM: CT ABDOMEN AND PELVIS WITHOUT CONTRAST TECHNIQUE: Multidetector CT imaging of the abdomen and pelvis was performed following the standard protocol without IV contrast. RADIATION DOSE REDUCTION: This exam was  performed according to the departmental dose-optimization program which includes automated exposure control, adjustment of the mA and/or kV according to patient size and/or  use of iterative reconstruction technique. COMPARISON:  03/14/2019 FINDINGS: Lower chest: Moderate effusions. Dependent lung base opacity consistent with atelectasis. Bilateral hilar calcifications. Small calcified lung base granuloma. Three-vessel coronary artery calcifications. Hepatobiliary: No focal liver abnormality is seen. Status post cholecystectomy. No biliary dilatation. Pancreas: Pancreatic atrophy.  No mass.  No inflammation. Spleen: Splenic calcifications consistent with healed granuloma. Spleen normal in size. No mass. Adrenals/Urinary Tract: No adrenal masses. No renal masses, stones or hydronephrosis. Normal ureters. Bilobed appearance of the bladder. Anterior superior aspect shows wall thickening and hazy inflammatory change in the adjacent fat. No bladder mass or stone. Stomach/Bowel: Stomach is unremarkable. Small bowel and colon are normal in caliber. No wall thickening. No inflammation. Normal appendix visualized. Vascular/Lymphatic: Dense aortic atherosclerotic calcifications extending to the branch vessels. No aneurysm. No enlarged lymph nodes. Reproductive: Unremarkable. Other: No abdominal wall hernia or ascites. Subcutaneous soft tissue edema of the right hemipelvis and visualized upper right thigh. ORIF of an intertrochanteric right femur fracture, incompletely imaged. This was performed on 12/15/2021. Musculoskeletal: Mild compression deformities of L1 and L2, unchanged. Schmorl's node along the upper endplate of L4, also stable. No acute fractures. No bone lesions. Old left rib fractures. IMPRESSION: 1. Moderate pleural effusions with associated dependent atelectasis. No convincing pneumonia. No evidence of pulmonary edema. Inferior hilar calcifications and calcified granuloma consistent with healed granulomatous  disease, also evident by splenic calcifications. 2. Bladder has a bilobed appearance, which it did on the previous CT. The upper portion of the bladder shows wall thickening and adjacent inflammatory type haziness. Findings suspicious for cystitis. No bladder mass. 3. No other evidence of an acute abnormality within the abdomen or pelvis. No evidence of malignancy. 4. Dense aortic atherosclerosis. 5. Changes from recent right proximal femur fracture ORIF. Electronically Signed   By: Lajean Manes M.D.   On: 01/10/2022 17:10   CT Chest Wo Contrast  Result Date: 01/10/2022 CLINICAL DATA:  Respiratory illness.  Congestion with cough. EXAM: CT CHEST WITHOUT CONTRAST TECHNIQUE: Multidetector CT imaging of the chest was performed following the standard protocol without IV contrast. RADIATION DOSE REDUCTION: This exam was performed according to the departmental dose-optimization program which includes automated exposure control, adjustment of the mA and/or kV according to patient size and/or use of iterative reconstruction technique. COMPARISON:  CT angio chest 04/20/2021 FINDINGS: Cardiovascular: Stable cardiac enlargement. No pericardial effusion. Aortic atherosclerosis and multi vessel coronary artery calcifications. Mediastinum/Nodes: Thyroid gland, trachea and esophagus are unremarkable. Calcified mediastinal and hilar lymph nodes compatible with prior granulomatous disease. No axillary or mediastinal adenopathy identified. Lungs/Pleura: Moderate bilateral pleural effusions are identified which are increased in volume compared with the previous exam. Airspace consolidation with air bronchograms identified within the right lower lobe, image 60/6 and image 104/2. Partial atelectasis of the right middle lobe. Mild interlobular septal thickening is noted with a lower lung zone gradient. Calcified granular the right upper lobe. Upper Abdomen: No acute abnormality. Aortic atherosclerosis. Calcified granulomas noted  within the spleen. Musculoskeletal: There are multiple healing left rib fractures which are new when compared with the prior exam. There is also a healing right anterior second rib fracture. No signs of acute rib fracture at this time. Sternum appears within normal limits. The vertebral body heights are well maintained. IMPRESSION: 1. Cardiac enlargement, moderate bilateral pleural effusions and mild interstitial edema concerning for CHF. 2. Airspace consolidation with air bronchograms identified within the right lower lobe suspicious for pneumonia. Partial atelectasis of the right middle lobe. 3. Multiple healing bilateral rib  fractures. 4. Three vessel coronary artery calcifications. 5. Aortic Atherosclerosis (ICD10-I70.0). Electronically Signed   By: Kerby Moors M.D.   On: 01/10/2022 10:04   DG Chest Portable 1 View  Result Date: 01/10/2022 CLINICAL DATA:  Cough, congestion EXAM: PORTABLE CHEST 1 VIEW COMPARISON:  04/25/2021 FINDINGS: Mild cardiac enlargement with increased vascular congestion and interstitial prominence suggesting developing edema. Pleural effusions noted bilaterally with bibasilar atelectasis/consolidation. Difficult to exclude basilar pneumonia. No pneumothorax. Aorta atherosclerotic. Calcified mediastinal lymph nodes again noted. IMPRESSION: Cardiomegaly with developing interstitial edema pattern. New bilateral pleural effusions and bibasilar atelectasis/consolidation. Electronically Signed   By: Jerilynn Mages.  Shick M.D.   On: 01/10/2022 08:37    Microbiology: Results for orders placed or performed during the hospital encounter of 01/10/22  SARS Coronavirus 2 by RT PCR (hospital order, performed in [facility] hospital lab) *cepheid single result test* Anterior Nasal Swab     Status: None   Collection Time: 01/10/22  8:21 AM   Specimen: Anterior Nasal Swab  Result Value Ref Range Status   SARS Coronavirus 2 by RT PCR NEGATIVE NEGATIVE Final    Comment: (NOTE) SARS-CoV-2 target nucleic  acids are NOT DETECTED.  The SARS-CoV-2 RNA is generally detectable in upper and lower respiratory specimens during the acute phase of infection. The lowest concentration of SARS-CoV-2 viral copies this assay can detect is 250 copies / mL. A negative result does not preclude SARS-CoV-2 infection and should not be used as the sole basis for treatment or other patient management decisions.  A negative result may occur with improper specimen collection / handling, submission of specimen other than nasopharyngeal swab, presence of viral mutation(s) within the areas targeted by this assay, and inadequate number of viral copies (<250 copies / mL). A negative result must be combined with clinical observations, patient history, and epidemiological information.  Fact Sheet for Patients:   https://www.patel.info/  Fact Sheet for Healthcare Providers: https://hall.com/  This test is not yet approved or  cleared by the [place] FDA and has been authorized for detection and/or diagnosis of SARS-CoV-2 by FDA under an Emergency Use Authorization (EUA).  This EUA will remain in effect (meaning this test can be used) for the duration of the COVID-19 declaration under Section 564(b)(1) of the Act, 21 U.S.C. section 360bbb-3(b)(1), unless the authorization is terminated or revoked sooner.  Performed at [facility], [address] [place], [place] [address]     Labs: CBC: Recent Labs  Lab 01/10/22 0821  WBC 9.6  NEUTROABS 7.0  HGB 9.7*  HCT 31.0*  MCV 97.5  PLT 654   Basic Metabolic Panel: Recent Labs  Lab 01/11/22 0412 01/12/22 0637 01/13/22 0449 01/14/22 1227 01/15/22 0725  NA 134* 139 139 137 140  K 2.9* 3.0* 3.4* 3.3* 4.0  CL 98 100 103 97* 105  CO2 26 28 26 29 26   GLUCOSE 210* 157* 164* 237* 177*  BUN 16 14 16 19 22   CREATININE 1.10 1.24 1.15 1.28* 1.18  CALCIUM 7.8* 7.9* 8.1* 8.2* 8.3*  MG  --   --  1.8  --  1.9    Liver Function Tests: Recent Labs  Lab 01/10/22 0821  AST 39  ALT <5  ALKPHOS 109  BILITOT 1.4*  PROT 6.2*  ALBUMIN 2.7*   CBG: Recent Labs  Lab 01/14/22 1229 01/14/22 1601 01/14/22 2047 01/15/22 0754 01/15/22 1154  GLUCAP 207* 192* 141* 197* 190*    Discharge time spent: greater than 30 minutes.  Signed: [name],  DO [facility] 01/15/2022

## 2022-01-15 NOTE — Care Management Important Message (Signed)
Important Message  Patient Details  Name: Gilbert [name] Sr. MRN: [id number] Date of Birth: Jul 20, 1940   Medicare Important Message Given:  Yes     [name] 01/15/2022, 12:34 PM

## 2022-01-15 NOTE — Progress Notes (Signed)
Cardiology Progress Note   Patient Name: Gilbert YSAGUIRRE Sr. Date of Encounter: 01/15/2022  Primary Cardiologist: Nelva Bush, MD  Subjective   Feels well this AM.  No chest pain or dyspnea.  Had another loose BM this AM.  Inpatient Medications    Scheduled Meds:  acetaminophen  1,000 mg Oral Q8H   amoxicillin-clavulanate  1 tablet Oral Q12H   aspirin EC  81 mg Oral BID   atorvastatin  40 mg Oral Daily   carbidopa-levodopa  2 tablet Oral QID   carvedilol  6.25 mg Oral BID WC   dapagliflozin propanediol  10 mg Oral Daily   divalproex  500 mg Oral BID   enoxaparin (LOVENOX) injection  40 mg Subcutaneous Q24H   feeding supplement (GLUCERNA SHAKE)  237 mL Oral TID BM   furosemide  40 mg Oral Daily   insulin aspart  0-15 Units Subcutaneous TID WC   irbesartan  75 mg Oral Daily   levothyroxine  75 mcg Oral QAC breakfast   megestrol  20 mg Oral Daily   multivitamin with minerals  1 tablet Oral Daily   mouth rinse  15 mL Mouth Rinse 4 times per day   rivastigmine  1.5 mg Oral BID   Continuous Infusions:  PRN Meds: acetaminophen, colchicine, guaiFENesin-dextromethorphan, methocarbamol, ondansetron (ZOFRAN) IV, mouth rinse, mouth rinse   Vital Signs    Vitals:   01/15/22 0426 01/15/22 0500 01/15/22 0516 01/15/22 0746  BP: 137/61   121/61  Pulse: 76   75  Resp: 20   18  Temp: 98 F (36.7 C)   98.5 F (36.9 C)  TempSrc: Oral   Oral  SpO2: 95%   95%  Weight:  70.9 kg 69.1 kg   Height:        Intake/Output Summary (Last 24 hours) at 01/15/2022 1041 Last data filed at 01/15/2022 0511 Gross per 24 hour  Intake --  Output 1450 ml  Net -1450 ml   Filed Weights   01/14/22 0500 01/15/22 0500 01/15/22 0516  Weight: 70.5 kg 70.9 kg 69.1 kg    Physical Exam   GEN: Frail, in no acute distress.  HEENT: Grossly normal.  Neck: Supple, no JVD, carotid bruits, or masses. Cardiac: RRR, no murmurs, rubs, or gallops. No clubbing, cyanosis, edema.  Radials 2+, DP/PT 2+  and equal bilaterally.  Respiratory:  Respirations regular and unlabored, scattered rhonchi. GI: Soft, nontender, nondistended, BS + x 4. MS: no deformity or atrophy. Skin: warm and dry, no rash. Neuro:  Strength and sensation are intact. Psych: AAOx3.  Normal affect.  Labs    Chemistry Recent Labs  Lab 01/10/22 [phone] 01/11/22 0412 01/13/22 0449 01/14/22 1227 01/15/22 0725  NA 131*   < > 139 137 140  K 5.5*   < > 3.4* 3.3* 4.0  CL 100   < > 103 97* 105  CO2 20*   < > 26 29 26   GLUCOSE 153*   < > 164* 237* 177*  BUN 17   < > 16 19 22   CREATININE 1.01   < > 1.15 1.28* 1.18  CALCIUM 8.0*   < > 8.1* 8.2* 8.3*  PROT 6.2*  --   --   --   --   ALBUMIN 2.7*  --   --   --   --   AST 39  --   --   --   --   ALT <5  --   --   --   --  ALKPHOS 109  --   --   --   --   BILITOT 1.4*  --   --   --   --   GFRNONAA >60   < > >60 56* >60  ANIONGAP 11   < > 10 11 9    < > = values in this interval not displayed.     Hematology Recent Labs  Lab 01/10/22 0821  WBC 9.6  RBC 3.18*  HGB 9.7*  HCT 31.0*  MCV 97.5  MCH 30.5  MCHC 31.3  RDW 16.6*  PLT 268    Cardiac Enzymes  Recent Labs  Lab 01/10/22 0821 01/13/22 0449  TROPONINIHS 39* 39*      BNP    Component Value Date/Time   BNP 1,827.4 (H) 01/10/2022 0821   Lipids  Lab Results  Component Value Date   CHOL 105 04/21/2021   HDL 34 (L) 04/21/2021   LDLCALC 37 04/21/2021   TRIG 168 (H) 04/21/2021   CHOLHDL 3.1 04/21/2021    HbA1c  Lab Results  Component Value Date   HGBA1C 5.4 01/10/2022    Radiology    -----  Telemetry    RSR, PVCs - Personally Reviewed  Cardiac Studies   2D Echocardiogram 9.18.2023   1. Left ventricular ejection fraction, by estimation, is 30 to 35%. The  left ventricle has moderately decreased function. The left ventricle  demonstrates regional wall motion abnormalities (see scoring  diagram/findings for description). The left  ventricular internal cavity size was moderately  dilated. Left ventricular  diastolic parameters were normal.   2. Right ventricular systolic function is normal. The right ventricular  size is normal.   3. Left atrial size was mildly dilated.   4. Right atrial size was mildly dilated.   5. The mitral valve is normal in structure. Moderate mitral valve  regurgitation.   6. Tricuspid valve regurgitation is moderate.   7. The aortic valve is normal in structure. Aortic valve regurgitation is  trivial.   Patient Profile     81 y.o. male with a history of chronic HFP EF, stroke complicated by aphasia, hypertension, hyperlipidemia, type 2 diabetes, Parkinson's disease, hypothyroidism, recent right intertrochanter femur fracture status post ORIF complicated by acute blood loss anemia, who has been seen and evaluated for acute HFrEF.  Assessment & Plan    1.  Acute HFrEF/Cardiomyopathy:  Admitted 9/17 w/ cough, dyspnea, and found to have new LV dysfxn w/ EF of 30-35%, mod MR/TR.  Transitioned to oral lasix this AM after sl rise in bun/creat yesterday - stable this AM.  Minus 1.45 L yesterday and minus 9.6 L since admission.  Feels well this AM and euvolemic on exam.  Cont ? blocker, ARB, sglt2i, and oral lasix.  Pressure has been stable.  Will transition to entresto to start tomorrow.  If tolerated, can also consider MRA either prior to d/c or in outpt setting.  2.  Supply-demand ischemia/Coronary Ca2+ onCT:  HsTrop mildly elevated w/ flat trend - 39 39.  Echo w/ EF 30-35%, mod MR/TR.  Denies c/p.  Cont asa, statin, ? blocker, ARB.  Will cont to defer ischemic eval to outpt setting.  As prev noted, poor CTA candidate 2/2 freq ectopy on tele, limited utility of stress testing w/ abnl EF and wall motion abnormality.  Cath likely best option.  3.  Essential HTN:  stable.  4.  HL:  LDL 37 on statin rx.  5.  DMII:  A1c 5.7.  SSI per IM>  6.  R hip fx s/p surgical fixation:  Per IM/PT.  7.  Hypokalemia:  Normal this AM.  8.  H/o CVA:   asa/statin.  Signed, Murray Hodgkins, NP  01/15/2022, 10:41 AM    For questions or updates, please contact   Please consult [email or web address] for contact info under Cardiology/STEMI.

## 2022-01-15 NOTE — Progress Notes (Signed)
Physical Therapy Treatment Patient Details Name: Gilbert [name]. MRN: [id number] DOB: Dec 08, 1940 Today's Date: 01/15/2022   History of Present Illness Mr. Borquez, an 81 y/o with Parkinson's, s/p CVA with aphasia, DM, Hypothyroidism, chronic HFpEF, recent ORIF right intratrochanteric fx with blood loss anemia was noted by SNF staff to have cough today. With concern for respiratory issues and suspected pneumonia by medical staff at his care facility, patient transported to [facility] for further evaluation. Patient is currently on antibiotics for UTI.    PT Comments    Pt seen for PT tx with pt agreeable. Pt reports he can get OOB but then asks for assistance & for PT to maneuver him to sitting EOB. PT provides education & pt able to transfer supine>sit with min assist with extra time & cuing for use of bed rails. Pt requires cuing for safe hand placement to push STS & pt able to complete transfer with min assist. Upon each STS from EOB pt with incontinent BM & requires total assist for peri hygiene & changing into clean gown. Planned to attempt ambulation but pt with ongoing incontinent BM so pt assisted to recliner in care of NT for further peri hygiene.      Recommendations for follow up therapy are one component of a multi-disciplinary discharge planning process, led by the attending physician.  Recommendations may be updated based on patient status, additional functional criteria and insurance authorization.  Follow Up Recommendations  Skilled nursing-short term rehab (<3 hours/day) Can patient physically be transported by private vehicle: No   Assistance Recommended at Discharge Frequent or constant Supervision/Assistance  Patient can return home with the following A lot of help with bathing/dressing/bathroom;Assistance with cooking/housework;Direct supervision/assist for medications management;Direct supervision/assist for financial management;Assist for transportation;Help with stairs  or ramp for entrance;A lot of help with walking and/or transfers   Equipment Recommendations       Recommendations for Other Services       Precautions / Restrictions Precautions Precautions: Fall Restrictions Weight Bearing Restrictions: No RLE Weight Bearing: Weight bearing as tolerated     Mobility  Bed Mobility   Bed Mobility: Supine to Sit     Supine to sit: Min guard, HOB elevated     General bed mobility comments: cuing to utilize bed rails    Transfers Overall transfer level: Needs assistance Equipment used: Rolling walker (2 wheels) Transfers: Sit to/from Stand Sit to Stand: Min assist           General transfer comment: cuing for safe hand placement to push to standing.    Ambulation/Gait Ambulation/Gait assistance: Independent Gait Distance (Feet): 3 Feet Assistive device: Rolling walker (2 wheels)   Gait velocity: decreased     General Gait Details: Pt takes a few steps bed>recliner   Stairs             Wheelchair Mobility    Modified Rankin (Stroke Patients Only)       Balance Overall balance assessment: Needs assistance, History of Falls Sitting-balance support: Feet supported Sitting balance-Leahy Scale: Fair     Standing balance support: Bilateral upper extremity supported, During functional activity, Reliant on assistive device for balance Standing balance-Leahy Scale: Fair                              Cognition Arousal/Alertness: Awake/alert Behavior During Therapy: WFL for tasks assessed/performed, Flat affect Overall Cognitive Status: Within Functional Limits for tasks assessed  General Comments: Joking throughout the session, question pt's awareness re: current abilities as pt states he can get out of bed but then asks for assistance for bed mobility.        Exercises      General Comments        Pertinent Vitals/Pain Pain Assessment Pain  Assessment: Faces Faces Pain Scale: No hurt    Home Living                          Prior Function            PT Goals (current goals can now be found in the care plan section) Acute Rehab PT Goals Patient Stated Goal: to go to [facility] PT Goal Formulation: With patient Time For Goal Achievement: 01/25/22 Potential to Achieve Goals: Fair Additional Goals Additional Goal #1: Pt will be able to perform bed mobility/transfers with cga and safe technique in order to improve functional independence Progress towards PT goals: Progressing toward goals    Frequency    Min 2X/week      PT Plan Current plan remains appropriate    Co-evaluation              AM-PAC PT "6 Clicks" Mobility   Outcome Measure  Help needed turning from your back to your side while in a flat bed without using bedrails?: A Little Help needed moving from lying on your back to sitting on the side of a flat bed without using bedrails?: A Little Help needed moving to and from a bed to a chair (including a wheelchair)?: A Little Help needed standing up from a chair using your arms (e.g., wheelchair or bedside chair)?: A Little Help needed to walk in hospital room?: A Lot Help needed climbing 3-5 steps with a railing? : A Lot 6 Click Score: 16    End of Session   Activity Tolerance:  (limited by ongoing incontinent BM) Patient left: in chair;with nursing/sitter in room   PT Visit Diagnosis: Unsteadiness on feet (R26.81);Other abnormalities of gait and mobility (R26.89);Muscle weakness (generalized) (M62.81);Difficulty in walking, not elsewhere classified (R26.2);History of falling (Z91.81)     Time: 3662-9476 PT Time Calculation (min) (ACUTE ONLY): 21 min  Charges:  $Therapeutic Activity: 8-22 mins                     Lavone Nian, PT, DPT 01/15/22, 12:17 PM    Waunita Schooner 01/15/2022, 12:15 PM

## 2022-01-15 NOTE — TOC Transition Note (Signed)
Transition of Care [facility]) - CM/SW Discharge Note   Patient Details  Name: Gilbert VOLLER Sr. MRN: [id number] Date of Birth: [birth year]  [facility] CM/SW Contact:  Alberteen Sam, LCSW Phone Number: 01/15/2022, 2:22 PM   Clinical Narrative:     Patient will DC to: [facility] Anticipated DC date: 01/15/22 Family notified:spouse Transport XM:IWOEH  Per MD patient ready for DC to [facility]. RN, patient, patient's family, and facility notified of DC. Discharge Summary sent to facility. RN given number for report  [phone]. DC packet on chart. Ambulance transport requested for patient.  CSW signing off.  Pricilla Riffle, LCSW    Final next level of care: Skilled Nursing Facility Barriers to Discharge: No Barriers Identified   Patient Goals and CMS Choice Patient states their goals for this hospitalization and ongoing recovery are:: to go home CMS Medicare.gov Compare Post Acute Care list provided to:: Patient Choice offered to / list presented to : Patient  Discharge Placement              Patient chooses bed at: [facility]     Patient and family notified of of transfer: 01/15/22  Discharge Plan and Services                                     Social Determinants of Health (SDOH) Interventions     Readmission Risk Interventions     No data to display

## 2022-01-18 ENCOUNTER — Ambulatory Visit: Payer: HMO | Admitting: Family

## 2022-01-24 NOTE — Progress Notes (Deleted)
   Patient ID: Enedina Finner Sr., male    DOB: 15-Sep-1940, 81 y.o.   MRN: [id number]  HPI  Mr Siemon is a 81 y/o male with a history of  Echo report from 01/11/22 reviewed and showed a EF of 30-35% along with mild LAE and moderate MR/TR.   Admitted 01/10/22 due to shortness of breath and cough. Abdominal/ chest CT's done. Cardiology consult obtained. Initially given IV lasix with transition to oral diuretics. Treated with antibiotics for aspiration pneumonia. Hypokalemia corrected. Discharged after 5 days to SNF.    He presents today for his initial visit with a chief complaint of   Review of Systems    Physical Exam  Assessment & Plan:  1: Chronic heart failure with reduced ejection fraction- - NYHA class  - BNP 01/10/22 was 1827.4  2: HTN with CKD- - BP - saw PCP Sabra Heck) 12/14/21 - BMP 01/15/22 reviewed and showed sodium 140, potassium 4.0, creatinine 1.18 and GFR >60  3: DM- - A1c 01/10/22 was 5.4%  4: Parkinson's- -

## 2022-01-25 ENCOUNTER — Ambulatory Visit: Payer: HMO | Admitting: Family

## 2022-01-25 ENCOUNTER — Telehealth: Payer: Self-pay | Admitting: Family

## 2022-01-25 NOTE — Telephone Encounter (Signed)
Patient did not show for his initial [facility] Failure [facility] appointment on 01/25/22. Will attempt to reschedule.

## 2022-04-28 DIAGNOSIS — F0283 Dementia in other diseases classified elsewhere, unspecified severity, with mood disturbance: Secondary | ICD-10-CM | POA: Diagnosis not present

## 2022-04-28 DIAGNOSIS — R1312 Dysphagia, oropharyngeal phase: Secondary | ICD-10-CM | POA: Diagnosis not present

## 2022-04-28 DIAGNOSIS — S72141D Displaced intertrochanteric fracture of right femur, subsequent encounter for closed fracture with routine healing: Secondary | ICD-10-CM | POA: Diagnosis not present

## 2022-04-28 DIAGNOSIS — R4701 Aphasia: Secondary | ICD-10-CM | POA: Diagnosis not present

## 2022-04-28 DIAGNOSIS — J690 Pneumonitis due to inhalation of food and vomit: Secondary | ICD-10-CM | POA: Diagnosis not present

## 2022-04-28 DIAGNOSIS — G20C Parkinsonism, unspecified: Secondary | ICD-10-CM | POA: Diagnosis not present

## 2022-05-07 DIAGNOSIS — N1831 Chronic kidney disease, stage 3a: Secondary | ICD-10-CM | POA: Diagnosis not present

## 2022-05-07 DIAGNOSIS — E785 Hyperlipidemia, unspecified: Secondary | ICD-10-CM | POA: Diagnosis not present

## 2022-05-07 DIAGNOSIS — I509 Heart failure, unspecified: Secondary | ICD-10-CM | POA: Diagnosis not present

## 2022-05-07 DIAGNOSIS — G20A1 Parkinson's disease without dyskinesia, without mention of fluctuations: Secondary | ICD-10-CM | POA: Diagnosis not present

## 2022-05-07 DIAGNOSIS — I130 Hypertensive heart and chronic kidney disease with heart failure and stage 1 through stage 4 chronic kidney disease, or unspecified chronic kidney disease: Secondary | ICD-10-CM | POA: Diagnosis not present

## 2022-05-07 DIAGNOSIS — E1122 Type 2 diabetes mellitus with diabetic chronic kidney disease: Secondary | ICD-10-CM | POA: Diagnosis not present

## 2022-05-07 DIAGNOSIS — F0283 Dementia in other diseases classified elsewhere, unspecified severity, with mood disturbance: Secondary | ICD-10-CM | POA: Diagnosis not present

## 2022-05-29 DIAGNOSIS — R1312 Dysphagia, oropharyngeal phase: Secondary | ICD-10-CM | POA: Diagnosis not present

## 2022-05-29 DIAGNOSIS — F0283 Dementia in other diseases classified elsewhere, unspecified severity, with mood disturbance: Secondary | ICD-10-CM | POA: Diagnosis not present

## 2022-05-29 DIAGNOSIS — G20C Parkinsonism, unspecified: Secondary | ICD-10-CM | POA: Diagnosis not present

## 2022-05-29 DIAGNOSIS — J690 Pneumonitis due to inhalation of food and vomit: Secondary | ICD-10-CM | POA: Diagnosis not present

## 2022-05-29 DIAGNOSIS — S72141D Displaced intertrochanteric fracture of right femur, subsequent encounter for closed fracture with routine healing: Secondary | ICD-10-CM | POA: Diagnosis not present

## 2022-05-29 DIAGNOSIS — R4701 Aphasia: Secondary | ICD-10-CM | POA: Diagnosis not present

## 2022-06-25 DEATH — deceased
